# Patient Record
Sex: Female | Born: 1955 | Race: Black or African American | Hispanic: No | Marital: Married | Smoking: Never smoker
Health system: Southern US, Community
[De-identification: ages and names within clinical notes are randomized; demographics above are authoritative.]

## PROBLEM LIST (undated history)

## (undated) DIAGNOSIS — R112 Nausea with vomiting, unspecified: Secondary | ICD-10-CM

## (undated) DIAGNOSIS — M1990 Unspecified osteoarthritis, unspecified site: Secondary | ICD-10-CM

## (undated) DIAGNOSIS — J309 Allergic rhinitis, unspecified: Secondary | ICD-10-CM

## (undated) DIAGNOSIS — R42 Dizziness and giddiness: Secondary | ICD-10-CM

## (undated) DIAGNOSIS — M545 Low back pain: Secondary | ICD-10-CM

## (undated) DIAGNOSIS — E7800 Pure hypercholesterolemia, unspecified: Secondary | ICD-10-CM

## (undated) DIAGNOSIS — K219 Gastro-esophageal reflux disease without esophagitis: Secondary | ICD-10-CM

## (undated) DIAGNOSIS — K589 Irritable bowel syndrome without diarrhea: Secondary | ICD-10-CM

## (undated) DIAGNOSIS — D649 Anemia, unspecified: Secondary | ICD-10-CM

## (undated) DIAGNOSIS — H60399 Other infective otitis externa, unspecified ear: Secondary | ICD-10-CM

## (undated) DIAGNOSIS — Z9889 Other specified postprocedural states: Secondary | ICD-10-CM

## (undated) DIAGNOSIS — M1711 Unilateral primary osteoarthritis, right knee: Secondary | ICD-10-CM

## (undated) DIAGNOSIS — I10 Essential (primary) hypertension: Secondary | ICD-10-CM

## (undated) DIAGNOSIS — M25519 Pain in unspecified shoulder: Secondary | ICD-10-CM

## (undated) DIAGNOSIS — M549 Dorsalgia, unspecified: Secondary | ICD-10-CM

## (undated) DIAGNOSIS — M109 Gout, unspecified: Secondary | ICD-10-CM

## (undated) HISTORY — DX: Other infective otitis externa, unspecified ear: H60.399

## (undated) HISTORY — DX: Irritable bowel syndrome without diarrhea: K58.9

## (undated) HISTORY — DX: Unspecified osteoarthritis, unspecified site: M19.90

## (undated) HISTORY — DX: Gastro-esophageal reflux disease without esophagitis: K21.9

## (undated) HISTORY — DX: Essential (primary) hypertension: I10

## (undated) HISTORY — PX: INGUINAL HERNIA REPAIR: SUR1180

## (undated) HISTORY — PX: JOINT REPLACEMENT: SHX530

## (undated) HISTORY — DX: Low back pain: M54.5

## (undated) HISTORY — DX: Allergic rhinitis, unspecified: J30.9

## (undated) HISTORY — DX: Anemia, unspecified: D64.9

## (undated) HISTORY — DX: Dorsalgia, unspecified: M54.9

## (undated) HISTORY — DX: Pain in unspecified shoulder: M25.519

## (undated) HISTORY — DX: Dizziness and giddiness: R42

## (undated) HISTORY — PX: UMBILICAL HERNIA REPAIR: SHX196

## (undated) HISTORY — DX: Gout, unspecified: M10.9

## (undated) HISTORY — PX: EXPLORATORY LAPAROTOMY: SUR591

---

## 1989-10-17 HISTORY — PX: LAPAROSCOPIC CHOLECYSTECTOMY: SUR755

## 1991-10-18 HISTORY — PX: OVARIAN CYST REMOVAL: SHX89

## 1994-10-17 HISTORY — PX: INCISIONAL HERNIA REPAIR: SHX193

## 1998-07-03 ENCOUNTER — Emergency Department (HOSPITAL_COMMUNITY): Admission: EM | Admit: 1998-07-03 | Discharge: 1998-07-03 | Payer: Self-pay | Admitting: *Deleted

## 1999-03-26 ENCOUNTER — Ambulatory Visit (HOSPITAL_COMMUNITY): Admission: RE | Admit: 1999-03-26 | Discharge: 1999-03-26 | Payer: Self-pay | Admitting: *Deleted

## 1999-03-30 ENCOUNTER — Ambulatory Visit (HOSPITAL_COMMUNITY): Admission: RE | Admit: 1999-03-30 | Discharge: 1999-03-30 | Payer: Self-pay | Admitting: *Deleted

## 1999-04-02 ENCOUNTER — Ambulatory Visit (HOSPITAL_COMMUNITY): Admission: RE | Admit: 1999-04-02 | Discharge: 1999-04-02 | Payer: Self-pay | Admitting: *Deleted

## 1999-04-02 ENCOUNTER — Encounter: Payer: Self-pay | Admitting: *Deleted

## 2000-03-20 ENCOUNTER — Ambulatory Visit (HOSPITAL_COMMUNITY): Admission: RE | Admit: 2000-03-20 | Discharge: 2000-03-20 | Payer: Self-pay | Admitting: Internal Medicine

## 2000-03-20 ENCOUNTER — Encounter: Payer: Self-pay | Admitting: Internal Medicine

## 2000-09-18 ENCOUNTER — Inpatient Hospital Stay (HOSPITAL_COMMUNITY): Admission: EM | Admit: 2000-09-18 | Discharge: 2000-09-22 | Payer: Self-pay | Admitting: Emergency Medicine

## 2000-09-18 ENCOUNTER — Encounter: Payer: Self-pay | Admitting: Emergency Medicine

## 2000-09-19 ENCOUNTER — Encounter (HOSPITAL_BASED_OUTPATIENT_CLINIC_OR_DEPARTMENT_OTHER): Payer: Self-pay | Admitting: General Surgery

## 2000-11-23 ENCOUNTER — Encounter: Payer: Self-pay | Admitting: Emergency Medicine

## 2000-11-23 ENCOUNTER — Emergency Department (HOSPITAL_COMMUNITY): Admission: EM | Admit: 2000-11-23 | Discharge: 2000-11-23 | Payer: Self-pay | Admitting: Emergency Medicine

## 2000-11-26 ENCOUNTER — Encounter (INDEPENDENT_AMBULATORY_CARE_PROVIDER_SITE_OTHER): Payer: Self-pay | Admitting: *Deleted

## 2000-11-27 ENCOUNTER — Encounter (HOSPITAL_BASED_OUTPATIENT_CLINIC_OR_DEPARTMENT_OTHER): Payer: Self-pay | Admitting: General Surgery

## 2000-11-27 ENCOUNTER — Encounter: Payer: Self-pay | Admitting: Emergency Medicine

## 2000-11-27 ENCOUNTER — Inpatient Hospital Stay (HOSPITAL_COMMUNITY): Admission: EM | Admit: 2000-11-27 | Discharge: 2000-12-06 | Payer: Self-pay | Admitting: Emergency Medicine

## 2000-11-27 HISTORY — PX: ABDOMINAL WALL MESH  REMOVAL: SHX1116

## 2000-11-27 HISTORY — PX: LYSIS OF ADHESION: SHX5961

## 2000-11-30 ENCOUNTER — Encounter (HOSPITAL_BASED_OUTPATIENT_CLINIC_OR_DEPARTMENT_OTHER): Payer: Self-pay | Admitting: General Surgery

## 2000-12-03 ENCOUNTER — Encounter (HOSPITAL_BASED_OUTPATIENT_CLINIC_OR_DEPARTMENT_OTHER): Payer: Self-pay | Admitting: General Surgery

## 2000-12-04 ENCOUNTER — Encounter (HOSPITAL_BASED_OUTPATIENT_CLINIC_OR_DEPARTMENT_OTHER): Payer: Self-pay | Admitting: General Surgery

## 2000-12-05 ENCOUNTER — Encounter (HOSPITAL_BASED_OUTPATIENT_CLINIC_OR_DEPARTMENT_OTHER): Payer: Self-pay | Admitting: General Surgery

## 2001-09-03 ENCOUNTER — Encounter (INDEPENDENT_AMBULATORY_CARE_PROVIDER_SITE_OTHER): Payer: Self-pay | Admitting: *Deleted

## 2001-09-03 ENCOUNTER — Ambulatory Visit (HOSPITAL_COMMUNITY): Admission: RE | Admit: 2001-09-03 | Discharge: 2001-09-04 | Payer: Self-pay | Admitting: General Surgery

## 2001-09-03 HISTORY — PX: INCISIONAL HERNIA REPAIR: SHX193

## 2002-05-17 ENCOUNTER — Encounter: Payer: Self-pay | Admitting: Internal Medicine

## 2002-05-17 ENCOUNTER — Encounter: Admission: RE | Admit: 2002-05-17 | Discharge: 2002-05-17 | Payer: Self-pay | Admitting: Internal Medicine

## 2002-12-02 ENCOUNTER — Encounter (HOSPITAL_BASED_OUTPATIENT_CLINIC_OR_DEPARTMENT_OTHER): Payer: Self-pay | Admitting: General Surgery

## 2002-12-02 ENCOUNTER — Ambulatory Visit (HOSPITAL_COMMUNITY): Admission: RE | Admit: 2002-12-02 | Discharge: 2002-12-02 | Payer: Self-pay | Admitting: General Surgery

## 2002-12-27 ENCOUNTER — Encounter (HOSPITAL_BASED_OUTPATIENT_CLINIC_OR_DEPARTMENT_OTHER): Payer: Self-pay | Admitting: General Surgery

## 2002-12-31 ENCOUNTER — Ambulatory Visit (HOSPITAL_COMMUNITY): Admission: RE | Admit: 2002-12-31 | Discharge: 2002-12-31 | Payer: Self-pay | Admitting: General Surgery

## 2002-12-31 HISTORY — PX: INCISIONAL HERNIA REPAIR: SHX193

## 2004-04-11 ENCOUNTER — Emergency Department (HOSPITAL_COMMUNITY): Admission: EM | Admit: 2004-04-11 | Discharge: 2004-04-12 | Payer: Self-pay | Admitting: Emergency Medicine

## 2004-09-08 ENCOUNTER — Ambulatory Visit: Payer: Self-pay | Admitting: Internal Medicine

## 2004-09-09 ENCOUNTER — Inpatient Hospital Stay (HOSPITAL_COMMUNITY): Admission: EM | Admit: 2004-09-09 | Discharge: 2004-09-17 | Payer: Self-pay | Admitting: Emergency Medicine

## 2004-09-09 HISTORY — PX: INCISIONAL HERNIA REPAIR: SHX193

## 2005-02-11 ENCOUNTER — Ambulatory Visit: Payer: Self-pay | Admitting: Internal Medicine

## 2005-02-11 ENCOUNTER — Ambulatory Visit (HOSPITAL_COMMUNITY): Admission: RE | Admit: 2005-02-11 | Discharge: 2005-02-11 | Payer: Self-pay | Admitting: Internal Medicine

## 2005-11-22 ENCOUNTER — Other Ambulatory Visit: Admission: RE | Admit: 2005-11-22 | Discharge: 2005-11-22 | Payer: Self-pay | Admitting: Obstetrics and Gynecology

## 2006-09-16 LAB — CONVERTED CEMR LAB: Pap Smear: NORMAL

## 2006-10-21 ENCOUNTER — Ambulatory Visit: Payer: Self-pay | Admitting: Family Medicine

## 2006-11-02 ENCOUNTER — Ambulatory Visit: Payer: Self-pay | Admitting: Internal Medicine

## 2006-11-02 LAB — CONVERTED CEMR LAB
ALT: 15 units/L (ref 0–40)
AST: 17 units/L (ref 0–37)
Albumin: 3.8 g/dL (ref 3.5–5.2)
Alkaline Phosphatase: 70 units/L (ref 39–117)
BUN: 6 mg/dL (ref 6–23)
Basophils Absolute: 0.1 10*3/uL (ref 0.0–0.1)
Basophils Relative: 0.9 % (ref 0.0–1.0)
Bilirubin Urine: NEGATIVE
CO2: 28 meq/L (ref 19–32)
Calcium: 9.3 mg/dL (ref 8.4–10.5)
Chloride: 100 meq/L (ref 96–112)
Cholesterol: 168 mg/dL (ref 0–200)
Creatinine, Ser: 0.5 mg/dL (ref 0.4–1.2)
Eosinophils Relative: 5.6 % — ABNORMAL HIGH (ref 0.0–5.0)
GFR calc Af Amer: 169 mL/min
GFR calc non Af Amer: 139 mL/min
Glucose, Bld: 99 mg/dL (ref 70–99)
HCT: 38.8 % (ref 36.0–46.0)
HDL: 67.3 mg/dL (ref >39.0)
Hemoglobin, Urine: NEGATIVE
Hemoglobin: 12.3 g/dL (ref 12.0–15.0)
Ketones, ur: NEGATIVE mg/dL
LDL Cholesterol: 94 mg/dL (ref 0–99)
Leukocytes, UA: NEGATIVE
Lymphocytes Relative: 38.4 % (ref 12.0–46.0)
MCHC: 31.8 g/dL (ref 30.0–36.0)
MCV: 84.1 fL (ref 78.0–100.0)
Monocytes Absolute: 0.5 10*3/uL (ref 0.2–0.7)
Monocytes Relative: 7 % (ref 3.0–11.0)
Neutro Abs: 3.6 10*3/uL (ref 1.4–7.7)
Neutrophils Relative %: 48.1 % (ref 43.0–77.0)
Nitrite: NEGATIVE
Platelets: 357 10*3/uL (ref 150–400)
Potassium: 3.5 meq/L (ref 3.5–5.1)
RBC: 4.61 M/uL (ref 3.87–5.11)
RDW: 11.8 % (ref 11.5–14.6)
Sodium: 135 meq/L (ref 135–145)
Specific Gravity, Urine: 1.02 (ref 1.000–1.03)
TSH: 1.07 microintl units/mL (ref 0.35–5.50)
Total Bilirubin: 0.7 mg/dL (ref 0.3–1.2)
Total CHOL/HDL Ratio: 2.5
Total Protein, Urine: NEGATIVE mg/dL
Total Protein: 7.9 g/dL (ref 6.0–8.3)
Triglycerides: 36 mg/dL (ref 0–149)
Urine Glucose: NEGATIVE mg/dL
Urobilinogen, UA: 1 (ref 0.0–1.0)
VLDL: 7 mg/dL (ref 0–40)
WBC: 7.4 10*3/uL (ref 4.5–10.5)
pH: 6 (ref 5.0–8.0)

## 2007-01-03 ENCOUNTER — Ambulatory Visit: Payer: Self-pay | Admitting: Internal Medicine

## 2007-02-26 ENCOUNTER — Ambulatory Visit: Payer: Self-pay | Admitting: Internal Medicine

## 2007-02-26 LAB — CONVERTED CEMR LAB
ALT: 13 units/L (ref 0–40)
AST: 17 units/L (ref 0–37)
Albumin: 3.6 g/dL (ref 3.5–5.2)
Alkaline Phosphatase: 71 units/L (ref 39–117)
Amylase: 63 units/L (ref 27–131)
BUN: 6 mg/dL (ref 6–23)
Bacteria, UA: NEGATIVE
Basophils Absolute: 0 10*3/uL (ref 0.0–0.1)
Basophils Relative: 0.5 % (ref 0.0–1.0)
Bilirubin Urine: NEGATIVE
Bilirubin, Direct: 0.2 mg/dL (ref 0.0–0.3)
CO2: 26 meq/L (ref 19–32)
Calcium: 9.3 mg/dL (ref 8.4–10.5)
Chloride: 108 meq/L (ref 96–112)
Creatinine, Ser: 0.6 mg/dL (ref 0.4–1.2)
Crystals: NEGATIVE
Eosinophils Absolute: 0.2 10*3/uL (ref 0.0–0.6)
Eosinophils Relative: 4.1 % (ref 0.0–5.0)
GFR calc Af Amer: 137 mL/min
GFR calc non Af Amer: 113 mL/min
Glucose, Bld: 93 mg/dL (ref 70–99)
H Pylori IgG: NEGATIVE
HCT: 38.5 % (ref 36.0–46.0)
Hemoglobin, Urine: NEGATIVE
Hemoglobin: 12.8 g/dL (ref 12.0–15.0)
Ketones, ur: NEGATIVE mg/dL
Leukocytes, UA: NEGATIVE
Lipase: 17 units/L (ref 11.0–59.0)
Lymphocytes Relative: 31.4 % (ref 12.0–46.0)
MCHC: 33.3 g/dL (ref 30.0–36.0)
MCV: 82.9 fL (ref 78.0–100.0)
Monocytes Absolute: 0.5 10*3/uL (ref 0.2–0.7)
Monocytes Relative: 9.5 % (ref 3.0–11.0)
Mucus, UA: NEGATIVE
Neutro Abs: 3 10*3/uL (ref 1.4–7.7)
Neutrophils Relative %: 54.5 % (ref 43.0–77.0)
Nitrite: NEGATIVE
Platelets: 303 10*3/uL (ref 150–400)
Potassium: 4 meq/L (ref 3.5–5.1)
RBC / HPF: NONE SEEN
RBC: 4.65 M/uL (ref 3.87–5.11)
RDW: 11.8 % (ref 11.5–14.6)
Sodium: 140 meq/L (ref 135–145)
Specific Gravity, Urine: 1.02 (ref 1.000–1.03)
Total Bilirubin: 1 mg/dL (ref 0.3–1.2)
Total Protein, Urine: NEGATIVE mg/dL
Total Protein: 7.8 g/dL (ref 6.0–8.3)
Urine Glucose: NEGATIVE mg/dL
Urobilinogen, UA: 0.2 (ref 0.0–1.0)
WBC: 5.4 10*3/uL (ref 4.5–10.5)
pH: 6 (ref 5.0–8.0)

## 2007-06-05 ENCOUNTER — Ambulatory Visit: Payer: Self-pay | Admitting: Internal Medicine

## 2007-06-06 DIAGNOSIS — I10 Essential (primary) hypertension: Secondary | ICD-10-CM | POA: Insufficient documentation

## 2007-06-06 DIAGNOSIS — K219 Gastro-esophageal reflux disease without esophagitis: Secondary | ICD-10-CM | POA: Insufficient documentation

## 2007-06-06 DIAGNOSIS — M1990 Unspecified osteoarthritis, unspecified site: Secondary | ICD-10-CM | POA: Insufficient documentation

## 2007-06-06 DIAGNOSIS — M545 Low back pain, unspecified: Secondary | ICD-10-CM | POA: Insufficient documentation

## 2007-06-06 DIAGNOSIS — K589 Irritable bowel syndrome without diarrhea: Secondary | ICD-10-CM

## 2007-06-06 HISTORY — DX: Essential (primary) hypertension: I10

## 2007-06-06 HISTORY — DX: Irritable bowel syndrome, unspecified: K58.9

## 2007-06-06 HISTORY — DX: Unspecified osteoarthritis, unspecified site: M19.90

## 2007-06-06 HISTORY — DX: Low back pain, unspecified: M54.50

## 2007-06-06 HISTORY — DX: Gastro-esophageal reflux disease without esophagitis: K21.9

## 2007-06-14 ENCOUNTER — Encounter: Payer: Self-pay | Admitting: Internal Medicine

## 2007-09-11 ENCOUNTER — Ambulatory Visit: Payer: Self-pay | Admitting: Internal Medicine

## 2007-09-11 DIAGNOSIS — R42 Dizziness and giddiness: Secondary | ICD-10-CM | POA: Insufficient documentation

## 2007-09-11 DIAGNOSIS — J309 Allergic rhinitis, unspecified: Secondary | ICD-10-CM | POA: Insufficient documentation

## 2007-09-11 DIAGNOSIS — H60399 Other infective otitis externa, unspecified ear: Secondary | ICD-10-CM | POA: Insufficient documentation

## 2007-09-11 HISTORY — DX: Dizziness and giddiness: R42

## 2007-09-11 HISTORY — DX: Allergic rhinitis, unspecified: J30.9

## 2007-09-11 HISTORY — DX: Other infective otitis externa, unspecified ear: H60.399

## 2007-11-14 ENCOUNTER — Ambulatory Visit: Payer: Self-pay | Admitting: Internal Medicine

## 2007-11-14 ENCOUNTER — Encounter (INDEPENDENT_AMBULATORY_CARE_PROVIDER_SITE_OTHER): Payer: Self-pay

## 2007-11-14 DIAGNOSIS — M109 Gout, unspecified: Secondary | ICD-10-CM

## 2007-11-14 DIAGNOSIS — M549 Dorsalgia, unspecified: Secondary | ICD-10-CM

## 2007-11-14 HISTORY — DX: Gout, unspecified: M10.9

## 2007-11-14 HISTORY — DX: Dorsalgia, unspecified: M54.9

## 2007-11-14 LAB — CONVERTED CEMR LAB
ALT: 14 units/L (ref 0–35)
ALT: 14 units/L (ref 0–35)
AST: 16 units/L (ref 0–37)
AST: 16 units/L (ref 0–37)
Albumin: 3.6 g/dL (ref 3.5–5.2)
Albumin: 3.6 g/dL (ref 3.5–5.2)
Alkaline Phosphatase: 61 units/L (ref 39–117)
Alkaline Phosphatase: 61 units/L (ref 39–117)
BUN: 9 mg/dL (ref 6–23)
BUN: 9 mg/dL (ref 6–23)
Basophils Absolute: 0 10*3/uL (ref 0.0–0.1)
Basophils Absolute: 0 10*3/uL (ref 0.0–0.1)
Basophils Relative: 0.4 % (ref 0.0–1.0)
Basophils Relative: 0.4 % (ref 0.0–1.0)
Bilirubin Urine: NEGATIVE
Bilirubin Urine: NEGATIVE
Bilirubin, Direct: 0.2 mg/dL (ref 0.0–0.3)
Bilirubin, Direct: 0.2 mg/dL (ref 0.0–0.3)
CO2: 25 meq/L (ref 19–32)
CO2: 25 meq/L (ref 19–32)
Calcium: 9.1 mg/dL (ref 8.4–10.5)
Calcium: 9.1 mg/dL (ref 8.4–10.5)
Chloride: 106 meq/L (ref 96–112)
Chloride: 106 meq/L (ref 96–112)
Cholesterol: 162 mg/dL (ref 0–200)
Cholesterol: 162 mg/dL (ref 0–200)
Creatinine, Ser: 0.6 mg/dL (ref 0.4–1.2)
Creatinine, Ser: 0.6 mg/dL (ref 0.4–1.2)
Eosinophils Absolute: 0.2 10*3/uL (ref 0.0–0.6)
Eosinophils Absolute: 0.2 10*3/uL (ref 0.0–0.6)
Eosinophils Relative: 4 % (ref 0.0–5.0)
Eosinophils Relative: 4 % (ref 0.0–5.0)
GFR calc Af Amer: 136 mL/min
GFR calc Af Amer: 136 mL/min
GFR calc non Af Amer: 112 mL/min
GFR calc non Af Amer: 112 mL/min
Glucose, Bld: 110 mg/dL — ABNORMAL HIGH (ref 70–99)
Glucose, Bld: 110 mg/dL — ABNORMAL HIGH (ref 70–99)
HCT: 37.1 % (ref 36.0–46.0)
HCT: 37.1 % (ref 36.0–46.0)
HDL: 53.7 mg/dL (ref >39.0)
HDL: 53.7 mg/dL (ref >39.0)
Hemoglobin, Urine: NEGATIVE
Hemoglobin, Urine: NEGATIVE
Hemoglobin: 11.8 g/dL — ABNORMAL LOW (ref 12.0–15.0)
Hemoglobin: 11.8 g/dL — ABNORMAL LOW (ref 12.0–15.0)
Ketones, ur: NEGATIVE mg/dL
Ketones, ur: NEGATIVE mg/dL
LDL Cholesterol: 100 mg/dL — ABNORMAL HIGH (ref 0–99)
LDL Cholesterol: 100 mg/dL — ABNORMAL HIGH (ref 0–99)
Leukocytes, UA: NEGATIVE
Leukocytes, UA: NEGATIVE
Lymphocytes Relative: 38.2 % (ref 12.0–46.0)
Lymphocytes Relative: 38.2 % (ref 12.0–46.0)
MCHC: 31.9 g/dL (ref 30.0–36.0)
MCHC: 31.9 g/dL (ref 30.0–36.0)
MCV: 83.7 fL (ref 78.0–100.0)
MCV: 83.7 fL (ref 78.0–100.0)
Monocytes Absolute: 0.4 10*3/uL (ref 0.2–0.7)
Monocytes Absolute: 0.4 10*3/uL (ref 0.2–0.7)
Monocytes Relative: 7.7 % (ref 3.0–11.0)
Monocytes Relative: 7.7 % (ref 3.0–11.0)
Neutro Abs: 2.7 10*3/uL (ref 1.4–7.7)
Neutro Abs: 2.7 10*3/uL (ref 1.4–7.7)
Neutrophils Relative %: 49.7 % (ref 43.0–77.0)
Neutrophils Relative %: 49.7 % (ref 43.0–77.0)
Nitrite: NEGATIVE
Nitrite: NEGATIVE
Platelets: 297 10*3/uL (ref 150–400)
Platelets: 297 10*3/uL (ref 150–400)
Potassium: 3.7 meq/L (ref 3.5–5.1)
Potassium: 3.7 meq/L (ref 3.5–5.1)
RBC: 4.43 M/uL (ref 3.87–5.11)
RBC: 4.43 M/uL (ref 3.87–5.11)
RDW: 12.2 % (ref 11.5–14.6)
RDW: 12.2 % (ref 11.5–14.6)
Sodium: 137 meq/L (ref 135–145)
Sodium: 137 meq/L (ref 135–145)
Specific Gravity, Urine: >=1.03 (ref 1.000–1.03)
Specific Gravity, Urine: >=1.03 (ref 1.000–1.03)
TSH: 1.88 microintl units/mL (ref 0.35–5.50)
TSH: 1.88 microintl units/mL (ref 0.35–5.50)
Total Bilirubin: 0.9 mg/dL (ref 0.3–1.2)
Total Bilirubin: 0.9 mg/dL (ref 0.3–1.2)
Total CHOL/HDL Ratio: 3
Total CHOL/HDL Ratio: 3
Total Protein, Urine: NEGATIVE mg/dL
Total Protein, Urine: NEGATIVE mg/dL
Total Protein: 7.4 g/dL (ref 6.0–8.3)
Total Protein: 7.4 g/dL (ref 6.0–8.3)
Triglycerides: 43 mg/dL (ref 0–149)
Triglycerides: 43 mg/dL (ref 0–149)
Uric Acid, Serum: 5.6 mg/dL (ref 2.4–7.0)
Urine Glucose: NEGATIVE mg/dL
Urine Glucose: NEGATIVE mg/dL
Urobilinogen, UA: 0.2 (ref 0.0–1.0)
Urobilinogen, UA: 0.2 (ref 0.0–1.0)
VLDL: 9 mg/dL (ref 0–40)
VLDL: 9 mg/dL (ref 0–40)
WBC: 5.3 10*3/uL (ref 4.5–10.5)
WBC: 5.3 10*3/uL (ref 4.5–10.5)
pH: 5.5 (ref 5.0–8.0)
pH: 5.5 (ref 5.0–8.0)

## 2008-12-30 ENCOUNTER — Ambulatory Visit: Payer: Self-pay | Admitting: Internal Medicine

## 2008-12-30 DIAGNOSIS — M25519 Pain in unspecified shoulder: Secondary | ICD-10-CM

## 2008-12-30 HISTORY — DX: Pain in unspecified shoulder: M25.519

## 2009-01-15 LAB — CONVERTED CEMR LAB: Pap Smear: NORMAL

## 2009-01-15 LAB — HM MAMMOGRAPHY: HM Mammogram: NORMAL

## 2009-02-04 ENCOUNTER — Telehealth: Payer: Self-pay | Admitting: Internal Medicine

## 2009-08-06 ENCOUNTER — Emergency Department (HOSPITAL_COMMUNITY): Admission: EM | Admit: 2009-08-06 | Discharge: 2009-08-07 | Payer: Self-pay | Admitting: Emergency Medicine

## 2009-08-28 ENCOUNTER — Encounter: Payer: Self-pay | Admitting: Internal Medicine

## 2009-09-01 ENCOUNTER — Encounter: Admission: RE | Admit: 2009-09-01 | Discharge: 2009-09-01 | Payer: Self-pay | Admitting: General Surgery

## 2009-10-17 HISTORY — PX: OTHER SURGICAL HISTORY: SHX169

## 2009-10-20 ENCOUNTER — Encounter (INDEPENDENT_AMBULATORY_CARE_PROVIDER_SITE_OTHER): Payer: Self-pay | Admitting: General Surgery

## 2009-10-20 ENCOUNTER — Inpatient Hospital Stay (HOSPITAL_COMMUNITY): Admission: AD | Admit: 2009-10-20 | Discharge: 2009-10-28 | Payer: Self-pay | Admitting: General Surgery

## 2009-10-20 HISTORY — PX: LYSIS OF ADHESION: SHX5961

## 2009-10-20 HISTORY — PX: BILATERAL SALPINGOOPHORECTOMY: SHX1223

## 2009-11-16 ENCOUNTER — Encounter: Payer: Self-pay | Admitting: Internal Medicine

## 2010-01-20 ENCOUNTER — Encounter: Payer: Self-pay | Admitting: Internal Medicine

## 2010-02-24 ENCOUNTER — Ambulatory Visit: Payer: Self-pay | Admitting: Internal Medicine

## 2010-02-24 LAB — CONVERTED CEMR LAB
ALT: 28 units/L (ref 0–35)
AST: 20 units/L (ref 0–37)
Albumin: 4.1 g/dL (ref 3.5–5.2)
Alkaline Phosphatase: 75 units/L (ref 39–117)
BUN: 11 mg/dL (ref 6–23)
Basophils Absolute: 0 10*3/uL (ref 0.0–0.1)
Basophils Relative: 0.3 % (ref 0.0–3.0)
Bilirubin Urine: NEGATIVE
Bilirubin, Direct: 0.1 mg/dL (ref 0.0–0.3)
CO2: 28 meq/L (ref 19–32)
Calcium: 9.5 mg/dL (ref 8.4–10.5)
Chloride: 107 meq/L (ref 96–112)
Cholesterol: 171 mg/dL (ref 0–200)
Creatinine, Ser: 0.4 mg/dL (ref 0.4–1.2)
Eosinophils Absolute: 0.4 10*3/uL (ref 0.0–0.7)
Eosinophils Relative: 8.9 % — ABNORMAL HIGH (ref 0.0–5.0)
GFR calc non Af Amer: 208.77 mL/min (ref >60)
Glucose, Bld: 98 mg/dL (ref 70–99)
HCT: 37 % (ref 36.0–46.0)
HDL: 78.2 mg/dL (ref >39.00)
Hemoglobin, Urine: NEGATIVE
Hemoglobin: 12.5 g/dL (ref 12.0–15.0)
Ketones, ur: NEGATIVE mg/dL
LDL Cholesterol: 87 mg/dL (ref 0–99)
Leukocytes, UA: NEGATIVE
Lymphocytes Relative: 41.7 % (ref 12.0–46.0)
Lymphs Abs: 1.9 10*3/uL (ref 0.7–4.0)
MCHC: 33.7 g/dL (ref 30.0–36.0)
MCV: 81.3 fL (ref 78.0–100.0)
Monocytes Absolute: 0.4 10*3/uL (ref 0.1–1.0)
Monocytes Relative: 8.8 % (ref 3.0–12.0)
Neutro Abs: 1.8 10*3/uL (ref 1.4–7.7)
Neutrophils Relative %: 40.3 % — ABNORMAL LOW (ref 43.0–77.0)
Nitrite: NEGATIVE
Platelets: 260 10*3/uL (ref 150.0–400.0)
Potassium: 4.1 meq/L (ref 3.5–5.1)
RBC: 4.55 M/uL (ref 3.87–5.11)
RDW: 13.6 % (ref 11.5–14.6)
Sodium: 142 meq/L (ref 135–145)
Specific Gravity, Urine: 1.025 (ref 1.000–1.030)
TSH: 1.92 microintl units/mL (ref 0.35–5.50)
Total Bilirubin: 0.9 mg/dL (ref 0.3–1.2)
Total CHOL/HDL Ratio: 2
Total Protein, Urine: NEGATIVE mg/dL
Total Protein: 7.9 g/dL (ref 6.0–8.3)
Triglycerides: 29 mg/dL (ref 0.0–149.0)
Urine Glucose: NEGATIVE mg/dL
Urobilinogen, UA: 0.2 (ref 0.0–1.0)
VLDL: 5.8 mg/dL (ref 0.0–40.0)
WBC: 4.6 10*3/uL (ref 4.5–10.5)
pH: 5.5 (ref 5.0–8.0)

## 2010-04-22 ENCOUNTER — Encounter: Payer: Self-pay | Admitting: Internal Medicine

## 2010-06-11 ENCOUNTER — Emergency Department (HOSPITAL_COMMUNITY): Admission: EM | Admit: 2010-06-11 | Discharge: 2010-06-12 | Payer: Self-pay | Admitting: Emergency Medicine

## 2010-06-14 ENCOUNTER — Telehealth: Payer: Self-pay | Admitting: Internal Medicine

## 2010-11-16 NOTE — Letter (Signed)
Summary: [facility]   Imported By: Sherian Rein 12/07/2009 07:24:27  _____________________________________________________________________  External Attachment:    Type:   Image     Comment:   External Document

## 2010-11-16 NOTE — Progress Notes (Signed)
Summary: Call Report  Phone Note Other Incoming   Caller: [facility] Summary of Call: Triage Call Report Triage Record Num: [id number] Operator: Coralee North Royal Patient Name: Bianca Campbell Call Date & Time: 06/11/2010 10:31:44PM Patient Phone: [phone] PCP: Oliver Barre Patient Gender: Female PCP Fax : [phone] Patient DOB: 14-Jul-1956 Practice Name: [facility] Reason for Call: Pt calling about having chest pains, heaviness in middle of her chest, she has hot flashes and isn't sure if the heaviness is from that or indigestion or her heart. Onset: 8/26  ~ 1730-1830. She feels heart "racing" when she got on her bed. Advised to call 911. Protocol(s) Used: Chest Pain Recommended Outcome per Protocol: Activate EMS 911 Reason for Outcome: New onset or worsening odd heartbeats or different heart rate (fluttering, skipping, or rapid heart beat) with this chest pain One or more occurrences of unexplained pain in shoulders, neck, jaw, in one or both arms, stomach or back lasting more than a few minutes that has not been evaluated by a healthcare Elmo Shumard and has risk factors for cardiovascular disease. Care Advice:  ~ An adult should stay with the patient, preferably one trained in CPR.  ~ IMMEDIATE ACTION Write down Clothilde Tippetts's name. List or place the following in a bag for transport with the patient: current prescription and/or nonprescription medications; alternative treatments, therapies and medications; and street drugs.  ~  ~ List, or take, all current prescription(s), nonprescription or alternative medication(s) to [facility]in for evaluation. After calling EMS 911, have the person chew one aspirin tablet (325 mg), or 4 baby aspirin (81mg ) with a small amount of water now if conscious, not allergic to aspirin, or if has not been told to avoid taking aspirin by their Tabitha Tupper. It is important to use aspirin, not acetaminophen.  ~ 06/11/2010 10:48:51PM Page 1 of 1  [id number] Initial call taken by: Margaret Pyle, CMA,  June 14, 2010 8:15 AM  Follow-up for Phone Call        noted  Follow-up by: [name] MD,  June 14, 2010 1:03 PM

## 2010-11-16 NOTE — Assessment & Plan Note (Signed)
Summary: req f/u appt/#/cd   Vital Signs:  Patient profile:   55 year old female Height:      60 inches Weight:      201 pounds BMI:     39.40 O2 Sat:      96 % on Room air Temp:     98.1 degrees F oral Pulse rate:   79 / minute BP sitting:   120 / 92  (left arm) Cuff size:   large  Vitals Entered By: Bill Salinas CMA (Feb 24, 2010 9:25 AM)  O2 Flow:  Room air  Preventive Care Screening  Pap Smear:    Date:  01/15/2009    Results:  normal   Bone Density:    Date:  01/15/2009    Results:  normal std dev  Mammogram:    Date:  01/15/2009    Results:  normal      declines colonoscopy due to recent abd surgury;    CC: pt here for a follow up office visit, she states she is only taking Azor, she is not taking any other medications/ ab   CC:  pt here for a follow up office visit, she states she is only taking Azor, and she is not taking any other medications/ ab.  History of Present Illness: overall doing well; lost 5 lbs overall since mar 2010;  wants to try "organics"  and maybe get off her azor;  Pt denies CP, sob, doe, wheezing, orthopnea, pnd, worsening LE edema, palps, dizziness or syncope  Pt denies new neuro symptoms such as headache, facial or extremity weakness     Problems Prior to Update: 1)  Shoulder Pain, Right  (ICD-719.41) 2)  Back Pain  (ICD-724.5) 3)  Preventive Health Care  (ICD-V70.0) 4)  Gout  (ICD-274.9) 5)  Gout  (ICD-274.9) 6)  Health Maintenance Exam  (ICD-V70.0) 7)  External Otitis  (ICD-380.10) 8)  Intermittent Vertigo  (ICD-780.4) 9)  Allergic Rhinitis  (ICD-477.9) 10)  Osteoarthritis  (ICD-715.90) 11)  Gerd  (ICD-530.81) 12)  Irritable Bowel Syndrome  (ICD-564.1) 13)  Low Back Pain  (ICD-724.2) 14)  Hypertension  (ICD-401.9)  Medications Prior to Update: 1)  Azor 10-40 Mg  Tabs (Amlodipine-Olmesartan) .Marland Kitchen.. 1 By Mouth Qd 2)  Ecotrin Low Strength 81 Mg  Tbec (Aspirin) .Marland Kitchen.. 1 By Mouth Qd 3)  Triamcinolone Acetonide 0.5 %  Crea  (Triamcinolone Acetonide) .... Use Asd Two Times A Day Prn 4)  Meclizine Hcl 12.5 Mg  Tabs (Meclizine Hcl) .Marland Kitchen.. 1 By Mouth Qid Prn 5)  Cetirizine Hcl 10 Mg  Tabs (Cetirizine Hcl) .Marland Kitchen.. 1 By Mouth Once Daily Prn 6)  Colchicine 0.6 Mg  Tabs (Colchicine) .Marland Kitchen.. 1 By Mouth Three Times A Day Prn 7)  Indocin Sr 75 Mg  Cpcr (Indomethacin) .Marland Kitchen.. 1 By Mouth Two Times A Day Prn 8)  Oxycodone-Acetaminophen 5-500 Mg Caps (Oxycodone-Acetaminophen) .Marland Kitchen.. 1 By Mouth Every 4-6 Hours 9)  Flexeril 5 Mg Tabs (Cyclobenzaprine Hcl) .Marland Kitchen.. 1 By Mouth Three Times A Day As Needed  Current Medications (verified): 1)  Azor 10-40 Mg  Tabs (Amlodipine-Olmesartan) .Marland Kitchen.. 1 By Mouth Once Daily 2)  Ecotrin Low Strength 81 Mg  Tbec (Aspirin) .Marland Kitchen.. 1 By Mouth Qd  Allergies (verified): No Known Drug Allergies  Past History:  Family History: Last updated: 11/14/2007 father with colon polyp ag 2 yo mother with RA  Social History: Last updated: 11/14/2007 Never Smoked Alcohol use-no unemployed - recently laid off  Risk Factors: Smoking Status: never (09/11/2007)  Past Medical History: Reviewed history from 11/14/2007 and no changes required. Hypertension Low back pain Obesity IBS GERD Osteoarthritis Allergic rhinitis Gout  Past Surgical History: Caesarean section X2 Cholecystectomy Inguinal herniorrhaphy Laparotomy-exploratory s/p incarcerated hernia and ovary cyst jan 2011  Review of Systems       all otherwise negative per pt -    Physical Exam  General:  alert and overweight-appearing.   Head:  normocephalic and atraumatic.   Eyes:  vision grossly intact, pupils equal, and pupils round.   Ears:  R ear normal and L ear normal.   Nose:  no external deformity and no nasal discharge.   Mouth:  no gingival abnormalities and pharynx pink and moist.   Neck:  supple and no masses.   Lungs:  normal respiratory effort and normal breath sounds.   Heart:  normal rate and regular rhythm.   Abdomen:  soft,  non-tender, and normal bowel sounds.   Msk:  no joint tenderness and no joint swelling.   Extremities:  no edema, no erythema  Neurologic:  cranial nerves II-XII intact and strength normal in all extremities.   Skin:  color normal and no rashes.   Psych:  memory intact for recent and remote and normally interactive.     Impression & Recommendations:  Problem # 1:  Preventive Health Care (ICD-V70.0)  Overall doing well, age appropriate education and counseling updated and referral for appropriate preventive services done unless declined, immunizations up to date or declined, diet counseling done if overweight, urged to quit smoking if smokes , most recent labs reviewed and current ordered if appropriate, ecg reviewed or declined (interpretation per ECG scanned in the EMR if done); information regarding Medicare Prevention requirements given if appropriate; speciality referrals updated as appropriate   Orders: TLB-BMP (Basic Metabolic Panel-BMET) (80048-METABOL) TLB-CBC Platelet - w/Differential (85025-CBCD) TLB-Hepatic/Liver Function Pnl (80076-HEPATIC) TLB-Lipid Panel (80061-LIPID) TLB-TSH (Thyroid Stimulating Hormone) (84443-TSH) TLB-Udip ONLY (81003-UDIP)  Problem # 2:  HYPERTENSION (ICD-401.9)  Her updated medication list for this problem includes:    Azor 10-40 Mg Tabs (Amlodipine-olmesartan) .Marland Kitchen... 1 by mouth once daily  BP today: 120/92 Prior BP: 160/120 (12/30/2008)  Labs Reviewed: K+: 3.7 (11/14/2007) Creat: : 0.6 (11/14/2007)   Chol: 162 (11/14/2007)   HDL: 53.7 (11/14/2007)   LDL: 100 (11/14/2007)   TG: 43 (11/14/2007) stable overall by hx and exam, ok to continue meds/tx as is   Complete Medication List: 1)  Azor 10-40 Mg Tabs (Amlodipine-olmesartan) .Marland Kitchen.. 1 by mouth once daily 2)  Ecotrin Low Strength 81 Mg Tbec (Aspirin) .Marland Kitchen.. 1 by mouth qd  Other Orders: Tdap => 73yrs IM (43329) Admin 1st Vaccine (51884)  Patient Instructions: 1)  Take an Aspirin every day - 81  mg - 1 per day - COATEd only 2)  you had the tetanus shot today 3)  Please go to the Lab in the basement for your blood and/or urine tests today  4)  Continue all previous medications as before this visit  5)  please remember your yearly pap smear and mammogram 6)  Please schedule a follow-up appointment in 1 year or sooner if needed Prescriptions: AZOR 10-40 MG  TABS (AMLODIPINE-OLMESARTAN) 1 by mouth once daily  #90 x 3   Entered and Authorized by:   [name] MD   Signed by:   [name] MD on 02/24/2010   Method used:   Print then Give to Patient   RxID:   [id number]    Immunization History:  Influenza  Immunization History:    Influenza:  declined (02/24/2010)  Immunizations Administered:  Tetanus Vaccine:    Vaccine Type: Tdap    Site: left deltoid    Mfr: GlaxoSmithKline    Dose: 0.5 ml    Route: IM    Given by: Ami Bullins CMA    Exp. Date: 01/09/2012    Lot #: [id number]    VIS given: 09/04/07 version given Feb 24, 2010.

## 2010-11-16 NOTE — Letter (Signed)
Summary: [facility]   Imported By: Lester Westgate 02/03/2010 09:33:48  _____________________________________________________________________  External Attachment:    Type:   Image     Comment:   External Document

## 2010-11-16 NOTE — Letter (Signed)
Summary: [facility]   Imported By: Sherian Rein 04/29/2010 09:13:13  _____________________________________________________________________  External Attachment:    Type:   Image     Comment:   External Document

## 2010-12-30 LAB — BASIC METABOLIC PANEL
BUN: 13 mg/dL (ref 6–23)
CO2: 25 mEq/L (ref 19–32)
Calcium: 9.7 mg/dL (ref 8.4–10.5)
Chloride: 104 mEq/L (ref 96–112)
Creatinine, Ser: 0.56 mg/dL (ref 0.4–1.2)
GFR calc Af Amer: >60 mL/min (ref >60)
GFR calc non Af Amer: >60 mL/min (ref >60)
Glucose, Bld: 121 mg/dL — ABNORMAL HIGH (ref 70–99)
Potassium: 4 mEq/L (ref 3.5–5.1)
Sodium: 136 mEq/L (ref 135–145)

## 2010-12-30 LAB — DIFFERENTIAL
Basophils Absolute: 0 10*3/uL (ref 0.0–0.1)
Basophils Relative: 1 % (ref 0–1)
Eosinophils Absolute: 0.1 10*3/uL (ref 0.0–0.7)
Eosinophils Relative: 3 % (ref 0–5)
Lymphocytes Relative: 41 % (ref 12–46)
Lymphs Abs: 2.3 10*3/uL (ref 0.7–4.0)
Monocytes Absolute: 0.4 10*3/uL (ref 0.1–1.0)
Monocytes Relative: 7 % (ref 3–12)
Neutro Abs: 2.8 10*3/uL (ref 1.7–7.7)
Neutrophils Relative %: 49 % (ref 43–77)

## 2010-12-30 LAB — POCT CARDIAC MARKERS
CKMB, poc: 1.2 ng/mL (ref 1.0–8.0)
Myoglobin, poc: 57.7 ng/mL (ref 12–200)
Troponin i, poc: <0.05 ng/mL (ref 0.00–0.09)

## 2010-12-30 LAB — D-DIMER, QUANTITATIVE: D-Dimer, Quant: <0.22 ug/mL-FEU (ref 0.00–0.48)

## 2010-12-30 LAB — CBC
HCT: 39.2 % (ref 36.0–46.0)
Hemoglobin: 13.1 g/dL (ref 12.0–15.0)
MCH: 27.8 pg (ref 26.0–34.0)
MCHC: 33.4 g/dL (ref 30.0–36.0)
MCV: 83.2 fL (ref 78.0–100.0)
Platelets: 291 10*3/uL (ref 150–400)
RBC: 4.72 MIL/uL (ref 3.87–5.11)
RDW: 14 % (ref 11.5–15.5)
WBC: 5.7 10*3/uL (ref 4.0–10.5)

## 2011-01-02 LAB — CBC
HCT: 31.5 % — ABNORMAL LOW (ref 36.0–46.0)
HCT: 33.3 % — ABNORMAL LOW (ref 36.0–46.0)
MCHC: 32.9 g/dL (ref 30.0–36.0)
MCHC: 33.2 g/dL (ref 30.0–36.0)
MCV: 84.2 fL (ref 78.0–100.0)
MCV: 84.4 fL (ref 78.0–100.0)
Platelets: 314 10*3/uL (ref 150–400)
RBC: 3.66 MIL/uL — ABNORMAL LOW (ref 3.87–5.11)
RBC: 3.74 MIL/uL — ABNORMAL LOW (ref 3.87–5.11)
RBC: 3.94 MIL/uL (ref 3.87–5.11)
RDW: 13.1 % (ref 11.5–15.5)
WBC: 10.1 10*3/uL (ref 4.0–10.5)

## 2011-01-02 LAB — BASIC METABOLIC PANEL
BUN: 5 mg/dL — ABNORMAL LOW (ref 6–23)
CO2: 26 mEq/L (ref 19–32)
CO2: 27 mEq/L (ref 19–32)
Calcium: 8.5 mg/dL (ref 8.4–10.5)
Chloride: 101 mEq/L (ref 96–112)
Chloride: 103 mEq/L (ref 96–112)
Creatinine, Ser: 0.53 mg/dL (ref 0.4–1.2)
Creatinine, Ser: 0.59 mg/dL (ref 0.4–1.2)
GFR calc Af Amer: >60 mL/min (ref >60)
GFR calc Af Amer: >60 mL/min (ref >60)
GFR calc Af Amer: >60 mL/min (ref >60)
Glucose, Bld: 115 mg/dL — ABNORMAL HIGH (ref 70–99)
Potassium: 4.3 mEq/L (ref 3.5–5.1)

## 2011-01-17 LAB — CBC
MCHC: 32.9 g/dL (ref 30.0–36.0)
Platelets: 279 10*3/uL (ref 150–400)
RDW: 13.8 % (ref 11.5–15.5)

## 2011-01-17 LAB — URINALYSIS, ROUTINE W REFLEX MICROSCOPIC
Bilirubin Urine: NEGATIVE
Ketones, ur: NEGATIVE mg/dL
Nitrite: NEGATIVE
Protein, ur: NEGATIVE mg/dL
Urobilinogen, UA: 0.2 mg/dL (ref 0.0–1.0)

## 2011-01-17 LAB — DIFFERENTIAL
Basophils Relative: 1 % (ref 0–1)
Lymphs Abs: 1.9 10*3/uL (ref 0.7–4.0)
Monocytes Absolute: 0.2 10*3/uL (ref 0.1–1.0)
Monocytes Relative: 5 % (ref 3–12)
Neutro Abs: 2.1 10*3/uL (ref 1.7–7.7)
Neutrophils Relative %: 47 % (ref 43–77)

## 2011-01-17 LAB — COMPREHENSIVE METABOLIC PANEL
ALT: 19 U/L (ref 0–35)
Albumin: 4 g/dL (ref 3.5–5.2)
Alkaline Phosphatase: 76 U/L (ref 39–117)
Calcium: 9.9 mg/dL (ref 8.4–10.5)
Potassium: 4.3 mEq/L (ref 3.5–5.1)
Sodium: 141 mEq/L (ref 135–145)
Total Protein: 8.3 g/dL (ref 6.0–8.3)

## 2011-01-17 LAB — PREGNANCY, URINE: Preg Test, Ur: NEGATIVE

## 2011-01-20 LAB — COMPREHENSIVE METABOLIC PANEL
ALT: 16 U/L (ref 0–35)
BUN: 8 mg/dL (ref 6–23)
CO2: 23 mEq/L (ref 19–32)
Calcium: 9.7 mg/dL (ref 8.4–10.5)
Creatinine, Ser: 0.43 mg/dL (ref 0.4–1.2)
GFR calc non Af Amer: >60 mL/min (ref >60)
Glucose, Bld: 115 mg/dL — ABNORMAL HIGH (ref 70–99)
Sodium: 134 mEq/L — ABNORMAL LOW (ref 135–145)
Total Protein: 7.7 g/dL (ref 6.0–8.3)

## 2011-01-20 LAB — DIFFERENTIAL
Basophils Absolute: 0 10*3/uL (ref 0.0–0.1)
Basophils Relative: 0 % (ref 0–1)
Eosinophils Absolute: 0.3 10*3/uL (ref 0.0–0.7)
Eosinophils Relative: 4 % (ref 0–5)
Monocytes Absolute: 0.3 10*3/uL (ref 0.1–1.0)
Monocytes Relative: 4 % (ref 3–12)
Neutro Abs: 4.6 10*3/uL (ref 1.7–7.7)

## 2011-01-20 LAB — CBC
Hemoglobin: 12.9 g/dL (ref 12.0–15.0)
MCHC: 32.7 g/dL (ref 30.0–36.0)
MCV: 83.1 fL (ref 78.0–100.0)
RBC: 4.76 MIL/uL (ref 3.87–5.11)
RDW: 13.4 % (ref 11.5–15.5)

## 2011-01-20 LAB — URINALYSIS, ROUTINE W REFLEX MICROSCOPIC
Bilirubin Urine: NEGATIVE
Hgb urine dipstick: NEGATIVE
Ketones, ur: 15 mg/dL — AB
Nitrite: NEGATIVE
Protein, ur: NEGATIVE mg/dL
Urobilinogen, UA: 0.2 mg/dL (ref 0.0–1.0)

## 2011-03-04 NOTE — Op Note (Signed)
NAMESYA, NESTLER                        ACCOUNT NO.:  [id number]   MEDICAL RECORD NO.:  [id number]                   PATIENT TYPE:  OIB   LOCATION:  2899                                 FACILITY:  [facility]   PHYSICIAN:  Leonie Man, M.D.                DATE OF BIRTH:  20-May-1956   DATE OF PROCEDURE:  12/31/2002  DATE OF DISCHARGE:                                 OPERATIVE REPORT   PREOPERATIVE DIAGNOSIS:  Recurrent ventral hernia.   POSTOPERATIVE DIAGNOSIS:  Recurrent ventral hernia.   OPERATION PERFORMED:  Repair of recurrent ventral hernia.   SURGEON:  Leonie Man, M.D.   ASSISTANT:  Nurse.   ANESTHESIA:  General.   INDICATIONS FOR PROCEDURE:  The patient is a 55 year old woman who has had  recurrent ventral hernias in the past, her original surgery having been for  ovarian cyst.  She returns now with a painful mass to the right and just  below the umbilicus which on palpation is an incarcerated ventral hernia.  She comes to the operating room after the risks and potential benefits of  the surgery have been fully discussed,  all questions answered and consent  obtained.   DESCRIPTION OF PROCEDURE:  Following the induction of satisfactory general  anesthesia, the patient positioned supinely.  The abdomen was prepped and  draped to be included in the sterile operative field.  The old midline  incision was opened up inferior to the umbilicus, deepened through the skin  and subcutaneous tissue carrying the dissection to the right laterally and  inferiorly to the region of the hernial sac and this was dissected free from  the surrounding soft tissues and carried down to the fascia.  There was a  portion of incarcerated small bowel within the hernia sac and the sac was  tightly adherent to the bowel.  The region around the hernia was dissected  free and the hernia extended out laterally and medially so as to free up the  incarcerated bowel.  The incarcerated bowel was  freed up in its entirety and  returned and reduced into the peritoneal cavity.  In palpating the abdominal  wall, there was another small hernia just inferior to this and the original  wound was opened up into this new hernia so as to make a single defect.  I  used a Seprafilm slurry to place over the underlying bowel so as to prevent  adhesions, then sewed in a polypropylene mesh patch with both interrupted  and running sutures of #1 Novofil, thus obliterating the hernial defect.  This was checked for hemostasis and noted to be dry.  Sponge, instrument and  sharp counts were then verified.  The subcutaneous tissues were then closed  with a running 2-0 Vicryl sutures.  Skin closed with running 4-0 Monocryl  suture, then reinforced with Steri-Strips.  Sterile dressings applied.  Anesthetic was reversed and the patient removed from the  operating room to  the recovery room in stable condition, having tolerated the procedure well.                                                Leonie Man, M.D.    PB/MEDQ  D:  12/31/2002  T:  12/31/2002  Job:  [id number]

## 2011-03-04 NOTE — Discharge Summary (Signed)
[place]  Patient:    Bianca Campbell, Bianca Campbell                     MRN: [id number] Adm. Date:  [id number] Disc. Date: [id number] Attending:  Sonda Primes                           Discharge Summary  ADMISSION DIAGNOSES: 1. Small bowel obstruction, high-grade. 2. Incarcerated abdominal hernia. 3. Hypertension. 4. Gastroesophageal reflux disease. 5. Irritable bowel syndrome.  DISCHARGE DIAGNOSES: 1. Small bowel obstruction, high-grade. 2. Incarcerated abdominal hernia. 3. Hypertension. 4. Gastroesophageal reflux disease. 5. Irritable bowel syndrome.  PROCEDURES IN HOSPITAL: 1. Exploratory laparotomy with adhesiolysis for small bowel obstruction. 2. CT scan of the abdomen and pelvis which showed a pelvic cyst. 3. Confirmatory transvaginal ultrasound which also showed left-sided ovarian    cyst.  HOSPITAL COMPLICATIONS:  None.  CONDITION ON DISCHARGE:  Improved.  DISCHARGE MEDICATIONS:  Vicodin.  POST DISCHARGE FOLLOW-UP:  In two weeks to see Luisa Hart L. Lurene Shadow, M.D.  The patient is also instructed to make an appointment to see her gynecologist within two weeks with regard to her pelvic cyst.  HISTORY OF PRESENT ILLNESS:  This patient is a 55 year old woman admitted through the emergency room with complaints of abdominal pain, nausea, and vomiting.  She had had some diarrheal bowel movements prior to admission.  A CT scan of her abdomen was consistent with a distal small bowel obstruction. She had multiple abdominal wall hernias.  In one of the hernias, there was some incarcerated bowel noted.  She had no associated fever or leukocytosis on admission.  She has undergone multiple previous abdominal wall operations. Most recently she had had a ventral hernia repair of the lower abdomen.  HOSPITAL COURSE:  Following admission, the patient was prepared to be taken to the operating room on November 27, 2000, where she underwent  exploratory laparotomy for adhesive of small bowel obstruction and repair of her multiple abdominal wall hernias.  Her postoperative course was somewhat prolonged due to an ileus, but otherwise unremarkable with resumption of bowel activity and manifested by bowel movements and flatus by the seventh postoperative day. She did have a small paraumbilical hematoma which on repeat CT scan was noted not to be a recurrent hernia.  She also had a transvaginal ultrasound which confirmed the presence of a left adnexal mass.  DISPOSITION:  She is being discharged now to be followed up in our office as noted above.  DISCHARGE MEDICATIONS:  Vicodin one to two every four hours p.r.n. for pain.  ACTIVITIES:  As tolerated.  DIET:  Not restricted. DD:  02/11/01 TD:  02/12/01 Job: [id number]

## 2011-03-04 NOTE — Op Note (Signed)
[place]  Patient:    Bianca Campbell, STIRN Visit Number: [id number] MRN: [id number]          Service Type: DSU Location: [phone] Attending Physician:  Sonda Primes Dictated by:   Mardene Celeste. Lurene Shadow, M.D. Proc. Date: 09/03/01 Admit Date:  09/03/2001                             Operative Report  PREOPERATIVE DIAGNOSIS:  Recurrent ventral hernia.  POSTOPERATIVE DIAGNOSIS:  Recurrent ventral hernia.  OPERATION PERFORMED:  Repair of recurrent ventral hernia.  SURGEON:  Mardene Celeste. Lurene Shadow, M.D.  ASSISTANT:  Marnee Spring. Wiliam Ke, M.D.  ANESTHESIA:  General.  INDICATIONS FOR PROCEDURE:  The patient is a 55 year old woman who has had two C-sections in the past.  She then subsequently developed a ventral hernia which was repaired with mesh.  She subsequently developed a bowel obstruction during which time, the mesh had to be removed as her abdomen was closed primarily.  She now returns with a recurrent ventral hernia for repair.  The risks and potential benefits of ventral hernia surgery had been fully discussed with the patient and she understands these risks and gives her full consent.  DESCRIPTION OF PROCEDURE:  Following the induction of satisfactory general anesthetic with the patient positioned supinely, a Foley catheter was placed in the urinary bladder and the abdomen was prepped and draped to be  included in a sterile operative field.  A lower midline incision was then made, deepened through the skin and subcutaneous tissues carrying down to this rather copious hernia sac which was dissected down upon and opened.  Multiple adhesions of small bowel to the hernia sac were taken down and reduced into the peritoneal cavity.  In evaluating the hernia sac there were two additional hernias noted, one superiorly and one inferiorly and laterally on to the right side.  These hernias were also reduced and strong fascial edges on the  lateral sides and superior and inferior side of the defect were cleared of adhesions. The most caudal aspect of the wound was inspected.  There was no evidence of an adherence of the bladder and the uterus was intact.  This area was also cleared.  I then placed an inside adhesive barrier with Seprafilm into the wound and on top of the viscera so as to prevent adherence of the mesh to the viscera.  Then sewed a piece of Prolene mesh which was modeled to fit the defect which was approximately 20 cm by approximately 8 cm in greatest dimensions.  Sponge, instrument and sharp counts were verified.  The mesh was then sewn in with a running #1 Novofil suture.  The repair was intact.  The previously dissected hernia sacs were removed and the dead space closed with running 2-0 Vicryl suture.  Subcutaneous tissues were also closed with running 2-0 Vicryl sutures.  Skin was closed with running 4-0 Monocryl and then reinforced with Steri-Strips.  Sterile dressings applied.  Anesthetic reversed.  Patient removed from the operating room to the recovery room in stable condition having tolerated the procedure well. Dictated by:   Mardene Celeste. Lurene Shadow, M.D. Attending Physician:  Sonda Primes DD:  09/03/01 TD:  09/03/01 Job: [id number]

## 2011-03-04 NOTE — Discharge Summary (Signed)
NAMEDEADRA, Bianca Campbell              ACCOUNT NO.:  [id number]   MEDICAL RECORD NO.:  [id number]          PATIENT TYPE:  INP   LOCATION:  0346                         FACILITY:  [facility]   PHYSICIAN:  Angelia Mould. Derrell Lolling, M.D.DATE OF BIRTH:  November 02, 1955   DATE OF ADMISSION:  09/08/2004  DATE OF DISCHARGE:  09/17/2004                                 DISCHARGE SUMMARY   FINAL DIAGNOSES:  1.  Recurrent ventral hernia with small bowel obstruction.  2.  Atypical chest pain, suspect gastroesophageal reflux disease.  3.  Status post multiple laparotomies.  4.  Hypertension.   OPERATION PERFORMED:  Repair of the current ventral incisional hernia with  mesh.   DATE OF SURGERY:  September 09, 2004.   HISTORY OF PRESENT ILLNESS:  This is a 55 year old black female with a  history of several ventral hernia repairs in the past.  She states that she  has had a lump in her lower abdominal wall for several months.  At 8:00 a.m.  on September 08, 2004, she developed upper abdominal discomfort which  persisted and a tender lump in the lower abdomen just to the right.  She  came to the emergency room, and was vomiting.  A CT scan was done, showing a  ventral hernia containing bowel and stool in the right lower quadrant, but  no signs of perforation or of abscess.  I was asked to see her.   PAST MEDICAL HISTORY:  1.  Two cesarean sections.  2.  Laparotomy for small bowel obstruction in February of 2002.  3.  Laparotomy for recurrent ventral hernia with mesh on September 03, 2001.  4.  Laparotomy for recurrent ventral hernia on Mar 02, 2003.  She had a      prior ventral hernia repair.  5.  Hypertension.  6.  Ovarian cysts.   CURRENT MEDICATIONS:  1.  Norvasc 5 mg daily.  2.  Birth control pills for ovarian cysts.   ALLERGIES:  Drug allergies:  None known.   PHYSICAL EXAMINATION:  GENERAL:  Overweight, black female in mild distress.  VITAL SIGNS:  Temp 98.9, pulse 99, respirations 16, blood pressure  114/79.  NECK:  Supple.  No mass.  LUNGS:  Clear to auscultation.  HEART:  Regular rate and rhythm.  No murmur.  ABDOMEN:  Obese.  Hypoactive bowel sounds.  Not really distended.  Abdomen  was mostly soft and nontender with the exception of a tender abdominal mass  in the lower abdomen below the umbilicus and just to the right.  This was  consistent with the CT-scan findings and incarcerated ventral hernia.  There  was a midline incision and a Pfannenstiel's incision as well.   ADMISSION DATA:  CT scan as described above.  White blood cell count 7400.   HOSPITAL COURSE:  The patient was taken to the operating room, and explored  through the lower midline incision.  She did have an incarcerated, recurrent  ventral hernia containing small bowel, but the small bowel was perfectly  viable.  Adhesions were taken down.  The small bowel was returned to the  peritoneal  cavity.  The defect in the abdominal wall was closed and then a  large sheet of polypropylene mesh was placed on top of this.  Postoperatively, the patient did fairly well.  She had some fever which  appeared to be due to atelectasis, and improved following pulmonary toilet  maneuvers.  On about the third postop day, she had some substernal chest  pain.  EKG showed nonspecific changes, and cardiac enzymes were negative.  Chest x-ray just showed a little bit of atelectasis.  I asked Dr. Corinda Gubler,  hospitalist, to see her.  Dr. Rene Paci did see her.  It was her  feeling that this was not a cardiac problem and most likely GI in etiology.  She followed the patient during her hospital stay, and recommended stress  test when recovered from her surgery, and made arrangements for that.  In  the meantime, we discontinued her H2 blockers for reflux symptoms.  She did  improve.  The patient was discharged on September 17, 2004.  At that time, she  was tolerating a regular diet, had had a bowel movement, and was passing  lots of flatus,  was hungry, comfortable, and wanted to go home.  Abdomen was  soft and not distended with good bowel sounds, and the wounds looked fine.  Jackson-Pratt drains were still draining more than  35 mL a day so they were left in place.  She was asked to follow up with me  in the office in one week.  She was asked to follow up with Dr. Corinda Gubler for  her stress test.  She was given a prescription for Vicodin for pain, and  told to resume her usual medications.     Hayw   HMI/MEDQ  D:  09/24/2004  T:  09/24/2004  Job:  [id number]   cc:   [name], M.D. [facility] L. Vincente Poli, M.D.  [address], [address]  [place] [address]  Fax: [phone]

## 2011-03-04 NOTE — Op Note (Signed)
Bianca Campbell, Bianca Campbell              ACCOUNT NO.:  [id number]   MEDICAL RECORD NO.:  [id number]          PATIENT TYPE:  INP   LOCATION:  0346                         FACILITY:  [facility]   PHYSICIAN:  Angelia Mould. Derrell Lolling, M.D.DATE OF BIRTH:  12-Feb-1956   DATE OF PROCEDURE:  09/09/2004  DATE OF DISCHARGE:                                 OPERATIVE REPORT   PREOPERATIVE DIAGNOSIS:  Incarcerated ventral hernia with a possible small-  bowel obstruction.   POSTOPERATIVE DIAGNOSIS:  Incarcerated ventral hernia with a possible small-  bowel obstruction.   OPERATION PERFORMED:  Repair recurrent, incarcerated ventral hernia with  mesh.   SURGEON:  Angelia Mould. Derrell Lolling, M.D.   OPERATIVE INDICATIONS:  This is a 55 year old white female who has had  several abdominal operations. She has had had 2 Cesarean sections, 1  laparotomy for small-bowel obstruction, and at least 3 ventral hernia  repairs. She presented to the emergency room with a 24-hour history of a  painful lower abdominal mass and vomiting. On exam, she has a tender soft  tissue mass in the abdominal wall below the umbilicus and just off to the  right. The rest of the abdomen is soft. CT scan confirms an incarcerated  ventral hernia with small bowel caught in the wound but evidence of  perforation or abscess. There was a suggestion of small-bowel obstruction  with slightly dilated small bowel. She was brought to the operating room  urgently.   OPERATIVE TECHNIQUE:  Following the induction of general endotracheal  anesthesia, a nasogastric tube and a Foley catheter were placed. Intravenous  antibiotics were given. The abdomen was prepped and draped in a sterile  fashion. Lower midline laparotomy was performed. Dissection was carried down  through the subcutaneous tissue. There was fairly intensive chronic  scarring. We slowly dissected down to the abdominal wall fascia above and  below the mass and then dissected around the incarcerated  hernia sac. I was  eventually able to free this up from the fascial edge and enter the hernia  sac and debride the sac away from the incarcerated small bowel. The small  bowel was pink and viable, and there was no evidence of any inflammatory  process and no exudate. The hernia defect itself was about 4 cm to 5 cm in  diameter. Once I debrided the hernia sac, I was able to free up some  adhesions and reduce the small bowel back into the abdominal cavity. There  was some adhesions underneath which were soft, and I was able to easily  dissect these away from the undersurface of the fascial defect. I closed the  defect in the fascia with 5 interrupted figure-of-eight sutures of #1  Novofil. I found 2 other smaller defects more cephalad and just to the left  of the midline. There was some incarcerated peritoneal fat there which I  debrided. Then I closed these with 0 Prolene sutures.   I undermined the subcutaneous tissue away from the anterior abdominal wall  fascia for about 4 to 5 cm in all directions until I was back to native  fascia laterally. There was  obviously some mesh present centrally in the  wound. The ventral hernia repair was reinforced with an Onlay graft of  Proceed composite polypropylene mesh. I brought an 8-inch x 10-inch piece of  mesh to the operative field and cut it down to size in an elliptical shape  to fill the wound. This was tacked in place with about 16 interrupted  sutures of 0 Prolene placed in a mattress suture fashion. I was careful to  place the side of the mesh which promotes tissue ingrowth toward the fascia.  This provided a very secure coverage without any gaps or redundancy. The  wound was irrigated with saline copiously. There was no bleeding. Two 43-  Jamaica Blake drains were placed in the wound and brought through separate  stab incisions superiorly and sutured to the skin with nylon sutures  connected to suction bulbs. Subcutaneous tissue was  closed  with interrupted sutures of 2-0 Vicryl. Skin was closed with skin staples.  Clean bandages were placed, and the patient was taken to the recovery room  in stable condition. Estimated blood loss was about 100 cc. Complications  none. Sponge, needle, and instrument counts were correct.     Hayw   HMI/MEDQ  D:  09/09/2004  T:  09/09/2004  Job:  [id number]

## 2011-03-04 NOTE — Op Note (Signed)
. [facility]  Patient:    Bianca Campbell, Bianca Campbell                     MRN: [id number] Proc. Date: 11/27/00 Adm. Date:  [id number] Attending:  Sonda Primes                           Operative Report  PREOPERATIVE DIAGNOSIS:  Small bowel obstruction with hernia incarceration.  POSTOPERATIVE DIAGNOSIS:  Adhesive small bowel obstruction with multiple nonincarcerated ventral hernias.  OPERATION PERFORMED:  Exploratory laparotomy with adhesiolysis for small bowel obstruction.  SURGEON:  Mardene Celeste. Lurene Shadow, M.D.  ASSISTANT:  Anselm Pancoast. Zachery Dakins, M.D.  ANESTHESIA:  General.  INDICATIONS FOR PROCEDURE:  The patient is a 55 year old woman with multiple previous abdominal operations, the most recent of which was a hernia repair for a hernia in the lower midline which was repaired with a Marlex plug and mesh.  She became sick approximately three days prior to admission with associated nausea and vomiting and abdominal distention with no passage of flatus.  On admission today she underwent a CT scan which showed a high grade small bowel obstruction.  She is admitted and now brought to the operating room for exploratory laparotomy.  DESCRIPTION OF PROCEDURE:  Following the induction of satisfactory general anesthesia, the patient positioned supinely, the abdomen was routinely prepped and draped to be included in a sterile operative field.  The old midline incision was then opened extending the incision somewhat superior to the umbilicus where an epigastric hernia could be palpated.  The abdomen was carefully opened and multiple adhesions to the anterior abdominal wall dissected free as we opened the abdomen from the epigastrium down to the pubis.  Multiple adhesions to the abdominal side wall were taken down and the region of obstruction delineated as a distal portion of small bowel which was adherent to a portion of a mesh which had been used to close  her previous abdominal wall hernia.  The mesh was dissected free from the abdominal wall and brought up into the wound with the piece of attached bowel.  The mesh was then carefully dissected off of the bowel and discarded.  The bowel was then opened at this point and a pool sucker inserted into the bowel, held with a pursestring suture and the bowel was fully decompressed.  The sucker was removed, the pursestring suture drawn tight and tied and the region then imbricated with interrupted Lembert sutures of 3-0 silk.  The bowel lumen was noted to be widely patent and there was no evidence of leak.  Several other regions of erosion into the bowel wall were also closed with interrupted Lembert sutures imbricating these erosions.  The bowel was then run from the ligament of Treitz to the ileocecal valve.  Additional adhesions to the abdominal wall were lysed.  The epigastric hernia was dissected free and the hernia sac removed and forwarded for pathologic evaluation.  All areas of dissection then checked for hemostasis and noted to be dry.  Sponge, instrument and sharp counts were verified.  The wound was closed in a single midline layer with #1 Novofil.  We elected not to put any mesh into the defect as she had already had the bowel opened.  I used a #1 Novofil to close the abdominal wall.  At the end of the closure, the closure appeared to be intact. Sponge, instrument and sharp counts were again  verified.  Subcutaneous tissues were irrigated with normal saline and the wound closed with stainless steel staples.   A sterile compressive dressing was applied.  Anesthetic reversed. Patient removed from the operating room to the recovery room in stable condition having tolerated the procedure well. DD:  11/27/00 TD:  11/28/00 Job: [id number]

## 2011-03-04 NOTE — H&P (Signed)
NAMENAKEITHA, MILLIGAN              ACCOUNT NO.:  [id number]   MEDICAL RECORD NO.:  [id number]          PATIENT TYPE:  INP   LOCATION:  0101                         FACILITY:  [facility]   PHYSICIAN:  Angelia Mould. Derrell Lolling, M.D.DATE OF BIRTH:  [birth year]   DATE OF ADMISSION:  09/08/2004  DATE OF DISCHARGE:                                HISTORY & PHYSICAL   CHIEF COMPLAINT:  Abdominal pain and vomiting.   HISTORY OF PRESENT ILLNESS:  This is a 55 year old black female who has had  several ventral hernia repairs in the past.  She thinks she has had a lump  in her lower abdominal wall for several months.  At 8 a.m. on September 08, 2004, she developed upper abdominal discomfort, which persisted, and also  developed a tender lump in the lower abdomen just to the right.  She came to  the emergency room, and has vomited once or twice here.  She was seen by the  emergency room physician.  A CT scan was done.  This shows a ventral hernia  containing bowel and stool in the right lower quadrant.  There are no signs  of perforation or abscess.  There is a little bit of dilatation of the small  bowel, suggesting the possibility of a small bowel obstruction.  Her white  blood cell count is 7400.  I was asked to see her.   PAST HISTORY:  1.  She has had 2 cesarean sections in the past.  2.  She had a laparotomy for small bowel obstruction in February of 2002.  3.  She had a laparotomy for repair of recurrent ventral hernia with mesh on      September 03, 2001.  4.  She had a laparotomy for repair of recurrent ventral hernia on December 31, 2002.  She may have had a hernia repair prior to that.  5.  Hypertension.  6.  She is being treated for ovarian cysts.   CURRENT MEDICATIONS:  1.  Norvasc 5 mg daily.  2.  She takes hormones and birth control pills for ovarian cysts.   DRUG ALLERGIES:  None known.   SOCIAL HISTORY:  The patient is married and lives in [place].  They have  2 children.  She is  a housewife.  She denies the use of alcohol or tobacco.   FAMILY HISTORY:  Mother living.  Has diabetes and hypertension and  arthritis.  Father living and has hypertension and arthritis.   REVIEW OF SYSTEMS:  All systems were reviewed.  They were noncontributory,  except as described above.   PHYSICAL EXAMINATION:  GENERAL:  Overweight black female in mild distress.  VITAL SIGNS:  Temperature 98.9, pulse 99, respirations 16, blood pressure  114/79.  HEENT:  Eyes - sclerae clear.  Extraocular movements intact.  Ears, nose,  mouth, and throat - nose, lips, tongue, and oropharynx are without gross  lesions.  NECK:  Supple, nontender.  No mass, no jugular venous distention.  LUNGS:  Clear to auscultation.  No chest wall tenderness.  HEART:  Regular rate and rhythm.  No murmur.  Radial, femoral, and posterior  pulses are palpable.  No peripheral edema.  BREASTS:  Not examined.  ABDOMEN:  Obese.  Hypoactive bowel sounds.  She does not really seem  distended.  The abdomen is mostly soft and nontender, with the exception of  a tender abdominal wall mass in the lower abdomen below the umbilicus to the  right.  This is consistent with an incarcerated ventral hernia.  There is a  Pfannenstiel incision, and a midline incision, as well.  EXTREMITIES:  She moves all 4 extremities well without pain or deformity.  NEUROLOGIC:  No gross motor or sensory deficits.   ASSESSMENT:  Incarcerated recurrent ventral hernia.  Cannot rule out  strangulation.  Cannot rule out small bowel obstruction.   PLAN:  1.  The patient was advised and agrees to undergo laparotomy and repair of      ventral hernia this morning.  2.  I have discussed the indication and details of surgery with her and her      husband.  Risks and complications have been outlined including, but not      limited to, bleeding, infection, recurrence of the hernia, injury to      adjacent organs such as the intestine or bladder or uterus with  major      reconstructive surgery, cardiac, pulmonary, and thromboembolic problems.      She seems to understand these issues well.  At this time, all of her      questions were answered.  She is willing and in agreement to have this      done.     Hayw   HMI/MEDQ  D:  09/09/2004  T:  09/09/2004  Job:  [id number]   cc:   [name], M.D. [facility]. Vincente Poli, M.D.  [address], [address] [place]  [place] [address]  Fax: [phone]

## 2011-05-29 ENCOUNTER — Emergency Department (HOSPITAL_COMMUNITY)
Admission: EM | Admit: 2011-05-29 | Discharge: 2011-05-29 | Disposition: A | Discharge status: Home / Self Care | Payer: 59 | Attending: Emergency Medicine | Admitting: Emergency Medicine

## 2011-05-29 ENCOUNTER — Emergency Department (HOSPITAL_COMMUNITY): Payer: 59

## 2011-05-29 DIAGNOSIS — R0609 Other forms of dyspnea: Secondary | ICD-10-CM | POA: Insufficient documentation

## 2011-05-29 DIAGNOSIS — R0602 Shortness of breath: Secondary | ICD-10-CM | POA: Insufficient documentation

## 2011-05-29 DIAGNOSIS — R110 Nausea: Secondary | ICD-10-CM | POA: Insufficient documentation

## 2011-05-29 DIAGNOSIS — R079 Chest pain, unspecified: Secondary | ICD-10-CM | POA: Insufficient documentation

## 2011-05-29 DIAGNOSIS — I10 Essential (primary) hypertension: Secondary | ICD-10-CM | POA: Insufficient documentation

## 2011-05-29 DIAGNOSIS — R0989 Other specified symptoms and signs involving the circulatory and respiratory systems: Secondary | ICD-10-CM | POA: Insufficient documentation

## 2011-05-29 DIAGNOSIS — Z79899 Other long term (current) drug therapy: Secondary | ICD-10-CM | POA: Insufficient documentation

## 2011-05-29 DIAGNOSIS — R61 Generalized hyperhidrosis: Secondary | ICD-10-CM | POA: Insufficient documentation

## 2011-05-29 DIAGNOSIS — R42 Dizziness and giddiness: Secondary | ICD-10-CM | POA: Insufficient documentation

## 2011-05-29 LAB — POCT I-STAT TROPONIN I
Troponin i, poc: 0 ng/mL (ref 0.00–0.08)
Troponin i, poc: 0.01 ng/mL (ref 0.00–0.08)
Troponin i, poc: 0 ng/mL (ref 0.00–0.08)

## 2011-05-29 LAB — POCT I-STAT, CHEM 8
HCT: 46 % (ref 36.0–46.0)
Hemoglobin: 15.6 g/dL — ABNORMAL HIGH (ref 12.0–15.0)
Potassium: 3.8 mEq/L (ref 3.5–5.1)
Sodium: 137 mEq/L (ref 135–145)

## 2011-05-29 LAB — DIFFERENTIAL
Basophils Absolute: 0.1 10*3/uL (ref 0.0–0.1)
Eosinophils Absolute: 0.2 10*3/uL (ref 0.0–0.7)
Lymphocytes Relative: 54 % — ABNORMAL HIGH (ref 12–46)
Neutro Abs: 2.9 10*3/uL (ref 1.7–7.7)

## 2011-05-29 LAB — CBC
MCH: 27.1 pg (ref 26.0–34.0)
MCHC: 34.6 g/dL (ref 30.0–36.0)
Platelets: ADEQUATE 10*3/uL (ref 150–400)
RBC: 5.2 MIL/uL — ABNORMAL HIGH (ref 3.87–5.11)

## 2011-05-29 LAB — BASIC METABOLIC PANEL
CO2: 27 mEq/L (ref 19–32)
Calcium: 10.4 mg/dL (ref 8.4–10.5)
Potassium: 4.2 mEq/L (ref 3.5–5.1)
Sodium: 134 mEq/L — ABNORMAL LOW (ref 135–145)

## 2011-05-30 ENCOUNTER — Other Ambulatory Visit (HOSPITAL_COMMUNITY): Payer: Self-pay | Admitting: Cardiology

## 2011-06-22 ENCOUNTER — Emergency Department (HOSPITAL_COMMUNITY): Payer: 59

## 2011-06-22 ENCOUNTER — Emergency Department (HOSPITAL_COMMUNITY)
Admission: EM | Admit: 2011-06-22 | Discharge: 2011-06-22 | Disposition: A | Discharge status: Home / Self Care | Payer: 59 | Attending: Emergency Medicine | Admitting: Emergency Medicine

## 2011-06-22 DIAGNOSIS — F411 Generalized anxiety disorder: Secondary | ICD-10-CM | POA: Insufficient documentation

## 2011-06-22 DIAGNOSIS — I10 Essential (primary) hypertension: Secondary | ICD-10-CM | POA: Insufficient documentation

## 2011-06-22 DIAGNOSIS — R109 Unspecified abdominal pain: Secondary | ICD-10-CM | POA: Insufficient documentation

## 2011-06-22 DIAGNOSIS — R112 Nausea with vomiting, unspecified: Secondary | ICD-10-CM | POA: Insufficient documentation

## 2011-06-22 DIAGNOSIS — R10816 Epigastric abdominal tenderness: Secondary | ICD-10-CM | POA: Insufficient documentation

## 2011-06-22 LAB — COMPREHENSIVE METABOLIC PANEL
Albumin: 4.3 g/dL (ref 3.5–5.2)
Alkaline Phosphatase: 105 U/L (ref 39–117)
BUN: 9 mg/dL (ref 6–23)
CO2: 25 mEq/L (ref 19–32)
Chloride: 98 mEq/L (ref 96–112)
GFR calc non Af Amer: >60 mL/min (ref >60)
Glucose, Bld: 125 mg/dL — ABNORMAL HIGH (ref 70–99)
Potassium: 3.8 mEq/L (ref 3.5–5.1)
Total Bilirubin: 0.4 mg/dL (ref 0.3–1.2)

## 2011-06-22 LAB — URINALYSIS, ROUTINE W REFLEX MICROSCOPIC
Glucose, UA: NEGATIVE mg/dL
Ketones, ur: NEGATIVE mg/dL
Leukocytes, UA: NEGATIVE
Protein, ur: NEGATIVE mg/dL

## 2011-06-22 LAB — DIFFERENTIAL
Basophils Relative: 0 % (ref 0–1)
Eosinophils Absolute: 0.1 10*3/uL (ref 0.0–0.7)
Lymphs Abs: 1.7 10*3/uL (ref 0.7–4.0)
Monocytes Relative: 4 % (ref 3–12)
Neutro Abs: 5.2 10*3/uL (ref 1.7–7.7)
Neutrophils Relative %: 71 % (ref 43–77)

## 2011-06-22 LAB — CBC
Hemoglobin: 13.1 g/dL (ref 12.0–15.0)
MCV: 79.9 fL (ref 78.0–100.0)
Platelets: 421 10*3/uL — ABNORMAL HIGH (ref 150–400)
RBC: 4.93 MIL/uL (ref 3.87–5.11)
WBC: 7.3 10*3/uL (ref 4.0–10.5)

## 2011-06-22 LAB — OCCULT BLOOD, POC DEVICE: Fecal Occult Bld: NEGATIVE

## 2011-06-23 LAB — URINE CULTURE: Colony Count: 30000

## 2011-06-24 ENCOUNTER — Other Ambulatory Visit (HOSPITAL_COMMUNITY): Payer: 59

## 2011-06-24 ENCOUNTER — Ambulatory Visit (INDEPENDENT_AMBULATORY_CARE_PROVIDER_SITE_OTHER): Payer: 59 | Admitting: Internal Medicine

## 2011-06-24 ENCOUNTER — Encounter: Payer: Self-pay | Admitting: Internal Medicine

## 2011-06-24 ENCOUNTER — Other Ambulatory Visit (INDEPENDENT_AMBULATORY_CARE_PROVIDER_SITE_OTHER): Payer: 59

## 2011-06-24 ENCOUNTER — Telehealth: Payer: Self-pay

## 2011-06-24 VITALS — BP 120/78 | HR 94 | Temp 98.1°F | Ht 60.0 in | Wt 211.0 lb

## 2011-06-24 DIAGNOSIS — R7309 Other abnormal glucose: Secondary | ICD-10-CM

## 2011-06-24 DIAGNOSIS — R7302 Impaired glucose tolerance (oral): Secondary | ICD-10-CM

## 2011-06-24 DIAGNOSIS — K529 Noninfective gastroenteritis and colitis, unspecified: Secondary | ICD-10-CM | POA: Insufficient documentation

## 2011-06-24 DIAGNOSIS — Z0001 Encounter for general adult medical examination with abnormal findings: Secondary | ICD-10-CM | POA: Insufficient documentation

## 2011-06-24 DIAGNOSIS — Z0000 Encounter for general adult medical examination without abnormal findings: Secondary | ICD-10-CM

## 2011-06-24 DIAGNOSIS — R079 Chest pain, unspecified: Secondary | ICD-10-CM | POA: Insufficient documentation

## 2011-06-24 DIAGNOSIS — K5289 Other specified noninfective gastroenteritis and colitis: Secondary | ICD-10-CM

## 2011-06-24 LAB — CBC WITH DIFFERENTIAL/PLATELET
Basophils Absolute: 0 10*3/uL (ref 0.0–0.1)
HCT: 37.5 % (ref 36.0–46.0)
Lymphs Abs: 2.2 10*3/uL (ref 0.7–4.0)
MCV: 82.2 fl (ref 78.0–100.0)
Monocytes Absolute: 0.5 10*3/uL (ref 0.1–1.0)
Platelets: 335 10*3/uL (ref 150.0–400.0)
RDW: 14.1 % (ref 11.5–14.6)

## 2011-06-24 LAB — BASIC METABOLIC PANEL
Chloride: 104 mEq/L (ref 96–112)
Potassium: 3.9 mEq/L (ref 3.5–5.1)

## 2011-06-24 LAB — LIPID PANEL
Cholesterol: 171 mg/dL (ref 0–200)
LDL Cholesterol: 86 mg/dL (ref 0–99)
Total CHOL/HDL Ratio: 2

## 2011-06-24 LAB — HEPATIC FUNCTION PANEL
ALT: 17 U/L (ref 0–35)
AST: 19 U/L (ref 0–37)
Alkaline Phosphatase: 83 U/L (ref 39–117)
Bilirubin, Direct: 0.1 mg/dL (ref 0.0–0.3)
Total Bilirubin: 0.7 mg/dL (ref 0.3–1.2)

## 2011-06-24 LAB — URINALYSIS, ROUTINE W REFLEX MICROSCOPIC
Bilirubin Urine: NEGATIVE
Hgb urine dipstick: NEGATIVE
Ketones, ur: NEGATIVE
Nitrite: NEGATIVE
Total Protein, Urine: NEGATIVE

## 2011-06-24 MED ORDER — AMLODIPINE-OLMESARTAN 10-40 MG PO TABS: 1.0000 | ORAL_TABLET | Freq: Every day | ORAL | Status: DC

## 2011-06-24 MED ORDER — OMEPRAZOLE 20 MG PO TBEC: 1.0000 | DELAYED_RELEASE_TABLET | Freq: Every day | ORAL | Status: DC

## 2011-06-24 MED ORDER — NITROGLYCERIN 0.3 MG SL SUBL: 0.3000 mg | SUBLINGUAL_TABLET | SUBLINGUAL | Status: DC | PRN

## 2011-06-24 NOTE — Assessment & Plan Note (Signed)
Asympt, encourage pt to fill med prescriptions (sent myself to her pharmacy on behalf of Dr Sharyn Lull today); reassured - ok to take with her usual meds

## 2011-06-24 NOTE — Assessment & Plan Note (Signed)
Asympt, likely stress related to recent AGE like illness, for a1c but doubt will need OHA,  To focus on diet and wt loss

## 2011-06-24 NOTE — Patient Instructions (Addendum)
Take all new medications as prescribed Continue all other medications as before Please go to LAB in the Basement for the blood and/or urine tests to be done today Please call the phone number [phone] (the [employer] System) for results of testing in 2-3 days;  When calling, simply dial the number, and when prompted enter the MRN number above (the Medical Record Number) and the # key, then the message should start. Please see Dr Magod/GI to set up the screening colonoscopy Please remember to followup with your GYN for the yearly pap smear and/or mammogram, in November as you normally do, as well as re-schedule your stress test with Dr Sharyn Lull We will fax your labs later today to Dr Sharyn Lull Please return in 6 mo with Lab testing done 3-5 days before

## 2011-06-24 NOTE — Assessment & Plan Note (Signed)
Aug 2012 calcium normal, no prior hx of same,  Most recent approx 11.7 on sept 5, will re-check today, as well as PTH level, but suspect due to dehydration, should improve with resolution of other AGE today

## 2011-06-24 NOTE — Progress Notes (Signed)
Subjective:    Patient ID: Bianca Campbell, female    DOB: November 16, 1955, 55 y.o.   MRN: [id number]  HPI   Here for wellness and f/u;  Overall doing ok;  Pt denies CP, worsening SOB, DOE, wheezing, orthopnea, PND, worsening LE edema, palpitations, dizziness or syncope.  Pt denies neurological change such as new Headache, facial or extremity weakness.  Pt denies polydipsia, polyuria, or low sugar symptoms. Pt states overall good compliance with treatment and medications, good tolerability, and trying to follow lower cholesterol diet.  Pt denies worsening depressive symptoms, suicidal ideation or panic. No fever, wt loss, night sweats, loss of appetite, or other constitutional symptoms.  Pt states good ability with ADL's, low fall risk, home safety reviewed and adequate, no significant changes in hearing or vision, and occasionally active with exercise.  Also Here to f/u after being seen in the [facility] ER sept 5, with c/o crampy abd pain, n/v and diarrhea and loose stool and noted elevated calcium > 11  Had stopped her calcium supplement for about a wk prior to illness, but was taking TUMS fairly frequently.  Was hemoccult neg, film neg for recurrent bowel obstruction, and labs o/w normal  - WBC, HGB, UA, LFT's, creatinine.  Smptoms since then have improved with no fever, further n/v and diarrhea, but some abd sorness persists mild, some worse lats night and to sleep on her side somewhat propped by a pillow.  No radiation, and diet back to usual.  Has not had to use the hydrocodone or zofran today for symtpoms.   Incidentally was seen per Cardiology/Dr Harwan with chest pain, tx with ntg sl prn, and omeprazole 20 mg (has not started either due to above) and stress test was due for today but she plans to re-schedule due to fatigue with this illness. Pt denies chest pain, increased sob or doe, wheezing, orthopnea, PND, increased LE swelling, palpitations, dizziness or syncope.  Pt denies new neurological symptoms such  as new headache, or facial or extremity weakness or numbness.   Pt denies polydipsia, polyuria.  Presents lab order per cardiology  - lipids, bmp, and HFP.   Pt denies fever, wt loss, night sweats, loss of appetite, or other constitutional symptoms Past Medical History  Diagnosis Date  . ALLERGIC RHINITIS 09/11/2007  . BACK PAIN 11/14/2007  . EXTERNAL OTITIS 09/11/2007  . GERD 06/06/2007  . GOUT 11/14/2007  . HYPERTENSION 06/06/2007  . INTERMITTENT VERTIGO 09/11/2007  . Irritable bowel syndrome 06/06/2007  . LOW BACK PAIN 06/06/2007  . OSTEOARTHRITIS 06/06/2007  . SHOULDER PAIN, RIGHT 12/30/2008   Past Surgical History  Procedure Date  . Cesarean section     x 2  . Cholecystectomy   . Inguinal herniorrhapy   . Exploratory laparotomy   . S/p incarcerated hernia and ovary cyst jan 2011     reports that she has never smoked. She does not have any smokeless tobacco history on file. She reports that she does not drink alcohol. Her drug history not on file. family history includes Arthritis in her mother and Colon polyps (age of onset:68) in her father. No Known Allergies No current outpatient prescriptions on file prior to visit.   Review of Systems Review of Systems  Constitutional: Negative for diaphoresis and unexpected weight change.  HENT: Negative for drooling and tinnitus.   Eyes: Negative for photophobia and visual disturbance.  Respiratory: Negative for choking and stridor.   Genitourinary: Negative for hematuria and decreased urine volume.  Musculoskeletal: Negative for  gait problem.  Skin: Negative for color change and wound.  Neurological: Negative for tremors and numbness.  Psychiatric/Behavioral: Negative for decreased concentration. The patient is not hyperactive.      Objective:   Physical Exam BP 120/78  Pulse 94  Temp(Src) 98.1 F (36.7 C) (Oral)  Ht 5' (1.524 m)  Wt 211 lb (95.709 kg)  BMI 41.21 kg/m2  SpO2 93% Physical Exam  VS noted, obese Constitutional:  Pt is oriented to person, place, and time. Appears well-developed and well-nourished.  Head: Normocephalic and atraumatic.  Right Ear: External ear normal.  Left Ear: External ear normal.  Nose: Nose normal.  Mouth/Throat: Oropharynx is clear and moist.  Eyes: Conjunctivae and EOM are normal. Pupils are equal, round, and reactive to light.  Neck: Normal range of motion. Neck supple. No JVD present. No tracheal deviation present.  Cardiovascular: Normal rate, regular rhythm, normal heart sounds and intact distal pulses.   Pulmonary/Chest: Effort normal and breath sounds normal.  Abdominal: Soft. Bowel sounds are normal. There is no tenderness. + BS - benign exam Musculoskeletal: Normal range of motion. Exhibits no edema.  Lymphadenopathy:  Has no cervical adenopathy.  Neurological: Pt is alert and oriented to person, place, and time. Pt has normal reflexes. No cranial nerve deficit.  Skin: Skin is warm and dry. No rash noted.  Psychiatric:  Has  normal mood and affect. Behavior is normal. 1+ nervous       Assessment & Plan:

## 2011-06-24 NOTE — Telephone Encounter (Signed)
Faxed copy of labs to Dr. Annitta Jersey office at [phone]

## 2011-06-24 NOTE — Assessment & Plan Note (Signed)
Overall doing well, age appropriate education and counseling updated, referrals for preventative services and immunizations addressed, dietary and smoking counseling addressed, most recent labs and ECG reviewed.  I have personally reviewed and have noted: 1) the patient's medical and social history 2) The pt's use of alcohol, tobacco, and illicit drugs 3) The patient's current medications and supplements 4) Functional ability including ADL's, fall risk, home safety risk, hearing and visual impairment 5) Diet and physical activities 6) Evidence for depression or mood disorder 7) The patient's height, weight, and BMI have been recorded in the chart I have made referrals, and provided counseling and education based on review of the above Declines flu shot today Plans to f/u with Dr Magod/GI for colonscopy screening

## 2011-06-24 NOTE — Assessment & Plan Note (Signed)
Suspect recent AGE, though cant r/o flare of IBS as well, pt to f/u with Dr Ewing Schlein as planned

## 2011-06-25 ENCOUNTER — Encounter: Payer: Self-pay | Admitting: Internal Medicine

## 2011-06-27 LAB — PTH, INTACT AND CALCIUM: PTH: 43.9 pg/mL (ref 14.0–72.0)

## 2011-08-24 ENCOUNTER — Other Ambulatory Visit: Payer: Self-pay | Admitting: *Deleted

## 2011-08-24 MED ORDER — TRIAMCINOLONE ACETONIDE 0.1 % EX CREA: TOPICAL_CREAM | Freq: Two times a day (BID) | CUTANEOUS | Status: DC

## 2011-08-24 NOTE — Telephone Encounter (Signed)
Done per emr  Ok for tylenol arthritis OTC asd on the bottle

## 2011-08-24 NOTE — Telephone Encounter (Signed)
Refill Request for cream given for Rash in Ear. Request to know what Pt can take for Arthritis pain w/her other meds.

## 2011-08-24 NOTE — Telephone Encounter (Signed)
Patient informed of prescription sent in for rash and instructions on OTC medication.

## 2011-12-22 ENCOUNTER — Ambulatory Visit: Payer: 59 | Admitting: Internal Medicine

## 2011-12-22 DIAGNOSIS — Z0289 Encounter for other administrative examinations: Secondary | ICD-10-CM

## 2012-02-23 ENCOUNTER — Other Ambulatory Visit (INDEPENDENT_AMBULATORY_CARE_PROVIDER_SITE_OTHER): Payer: 59

## 2012-02-23 ENCOUNTER — Encounter: Payer: Self-pay | Admitting: Internal Medicine

## 2012-02-23 ENCOUNTER — Ambulatory Visit (INDEPENDENT_AMBULATORY_CARE_PROVIDER_SITE_OTHER): Payer: 59 | Admitting: Internal Medicine

## 2012-02-23 VITALS — BP 112/80 | HR 74 | Temp 98.0°F | Ht 60.0 in | Wt 206.2 lb

## 2012-02-23 DIAGNOSIS — R7302 Impaired glucose tolerance (oral): Secondary | ICD-10-CM

## 2012-02-23 DIAGNOSIS — Z0000 Encounter for general adult medical examination without abnormal findings: Secondary | ICD-10-CM

## 2012-02-23 DIAGNOSIS — J309 Allergic rhinitis, unspecified: Secondary | ICD-10-CM

## 2012-02-23 DIAGNOSIS — R7309 Other abnormal glucose: Secondary | ICD-10-CM

## 2012-02-23 DIAGNOSIS — I10 Essential (primary) hypertension: Secondary | ICD-10-CM

## 2012-02-23 LAB — HEPATIC FUNCTION PANEL
Albumin: 4.1 g/dL (ref 3.5–5.2)
Alkaline Phosphatase: 89 U/L (ref 39–117)

## 2012-02-23 LAB — CBC WITH DIFFERENTIAL/PLATELET
Basophils Relative: 1.1 % (ref 0.0–3.0)
Eosinophils Relative: 4.7 % (ref 0.0–5.0)
HCT: 41 % (ref 36.0–46.0)
Hemoglobin: 13.3 g/dL (ref 12.0–15.0)
Lymphocytes Relative: 45.2 % (ref 12.0–46.0)
Lymphs Abs: 2.7 10*3/uL (ref 0.7–4.0)
Monocytes Relative: 8.7 % (ref 3.0–12.0)
Neutro Abs: 2.4 10*3/uL (ref 1.4–7.7)
RBC: 4.98 Mil/uL (ref 3.87–5.11)
WBC: 5.9 10*3/uL (ref 4.5–10.5)

## 2012-02-23 LAB — URINALYSIS, ROUTINE W REFLEX MICROSCOPIC
Ketones, ur: NEGATIVE
Specific Gravity, Urine: 1.02 (ref 1.000–1.030)
Urine Glucose: NEGATIVE
pH: 6 (ref 5.0–8.0)

## 2012-02-23 LAB — LIPID PANEL
HDL: 85.9 mg/dL (ref >39.00)
Triglycerides: 45 mg/dL (ref 0.0–149.0)
VLDL: 9 mg/dL (ref 0.0–40.0)

## 2012-02-23 LAB — BASIC METABOLIC PANEL
BUN: 10 mg/dL (ref 6–23)
Calcium: 9.7 mg/dL (ref 8.4–10.5)
Creatinine, Ser: 0.5 mg/dL (ref 0.4–1.2)
GFR: 168.69 mL/min (ref >60.00)
Glucose, Bld: 97 mg/dL (ref 70–99)
Sodium: 139 mEq/L (ref 135–145)

## 2012-02-23 LAB — HEMOGLOBIN A1C: Hgb A1c MFr Bld: 6.3 % (ref 4.6–6.5)

## 2012-02-23 MED ORDER — METHYLPREDNISOLONE ACETATE 80 MG/ML IJ SUSP
120.0000 mg | Freq: Once | INTRAMUSCULAR | Status: AC
  Administered 2012-02-23: 120 mg via INTRAMUSCULAR

## 2012-02-23 MED ORDER — FEXOFENADINE HCL 180 MG PO TABS: 180.0000 mg | ORAL_TABLET | Freq: Every day | ORAL | Status: DC

## 2012-02-23 MED ORDER — FLUTICASONE PROPIONATE 50 MCG/ACT NA SUSP: 2.0000 | Freq: Every day | NASAL | Status: DC

## 2012-02-23 NOTE — Patient Instructions (Addendum)
You had the steroid shot today Take all new medications as prescribed - the generic allegra and flonase Continue all other medications as before

## 2012-02-26 ENCOUNTER — Encounter: Payer: Self-pay | Admitting: Internal Medicine

## 2012-02-26 MED ORDER — CELECOXIB 200 MG PO CAPS: 200.0000 mg | ORAL_CAPSULE | Freq: Every day | ORAL | Status: AC | PRN

## 2012-02-26 NOTE — Progress Notes (Signed)
Subjective:    Patient ID: Bianca Campbell, female    DOB: 1955-12-02, 56 y.o.   MRN: [id number]  HPI  Here with several wks ongoing nasal allergy symptoms with clear congestion, itch and sneeze, without fever, pain, ST, cough or wheezing.  Pt denies chest pain, increased sob or doe, wheezing, orthopnea, PND, increased LE swelling, palpitations, dizziness or syncope.   Pt denies polydipsia, polyuria Pt states overall good compliance with meds, trying to follow lower cholesterol, diabetic diet, wt overall stable but little exercise however, hard to lose wt.  Pt denies new neurological symptoms such as new headache, or facial or extremity weakness or numbness    Pt denies fever, wt loss, night sweats, loss of appetite, or other constitutional symptoms Past Medical History  Diagnosis Date  . ALLERGIC RHINITIS 09/11/2007  . BACK PAIN 11/14/2007  . EXTERNAL OTITIS 09/11/2007  . GERD 06/06/2007  . GOUT 11/14/2007  . HYPERTENSION 06/06/2007  . INTERMITTENT VERTIGO 09/11/2007  . Irritable bowel syndrome 06/06/2007  . LOW BACK PAIN 06/06/2007  . OSTEOARTHRITIS 06/06/2007  . SHOULDER PAIN, RIGHT 12/30/2008   Past Surgical History  Procedure Date  . Cesarean section     x 2  . Cholecystectomy   . Inguinal herniorrhapy   . Exploratory laparotomy   . S/p incarcerated hernia and ovary cyst jan 2011     reports that she has never smoked. She does not have any smokeless tobacco history on file. She reports that she does not drink alcohol. Her drug history not on file. family history includes Arthritis in her mother and Colon polyps (age of onset:68) in her father. No Known Allergies Current Outpatient Prescriptions on File Prior to Visit  Medication Sig Dispense Refill  . amLODipine-olmesartan (AZOR) 10-40 MG per tablet Take 1 tablet by mouth daily.  90 tablet  3  . aspirin 81 MG tablet Take 81 mg by mouth daily.        . nitroGLYCERIN (NITROSTAT) 0.3 MG SL tablet Place 1 tablet (0.3 mg total) under the  tongue every 5 (five) minutes as needed for chest pain.  90 tablet  12  . Omeprazole 20 MG TBEC Take 1 tablet (20 mg total) by mouth daily.  90 each  3  . triamcinolone (KENALOG) 0.1 % cream Apply topically 2 (two) times daily.  30 g  0  . fexofenadine (ALLEGRA) 180 MG tablet Take 1 tablet (180 mg total) by mouth daily.  30 tablet  2  . fluticasone (FLONASE) 50 MCG/ACT nasal spray Place 2 sprays into the nose daily.  16 g  2   Review of Systems Constitutional: Negative for diaphoresis and unexpected weight change.  HENT: Negative for drooling and tinnitus.   Eyes: Negative for photophobia and visual disturbance.  Respiratory: Negative for choking and stridor.   Gastrointestinal: Negative for vomiting and blood in stool.  Genitourinary: Negative for hematuria and decreased urine volume.   Skin: Negative for color change and wound.  Neurological: Negative for tremors and numbness.     Objective:   Physical Exam BP 112/80  Pulse 74  Temp(Src) 98 F (36.7 C) (Oral)  Ht 5' (1.524 m)  Wt 206 lb 4 oz (93.554 kg)  BMI 40.28 kg/m2  SpO2 97% Physical Exam  VS noted, not ill appearing Constitutional: Pt appears well-developed and well-nourished.  HENT: Head: Normocephalic.  Right Ear: External ear normal.  Left Ear: External ear normal.  Bilat tm's mild erythema.  Sinus nontender.  Pharynx mild erythema Eyes:  Conjunctivae and EOM are normal. Pupils are equal, round, and reactive to light.  Neck: Normal range of motion. Neck supple.  Cardiovascular: Normal rate and regular rhythm.   Pulmonary/Chest: Effort normal and breath sounds normal.  Neurological: Pt is alert. Not confused Skin: Skin is warm. No erythema. No rash Psychiatric: Pt behavior is normal. Thought content normal.1+ nervous     Assessment & Plan:

## 2012-02-26 NOTE — Assessment & Plan Note (Signed)
stable overall by hx and exam, most recent data reviewed with pt, and pt to continue medical treatment as before BP Readings from Last 3 Encounters:  02/23/12 112/80  06/24/11 120/78  02/24/10 120/92

## 2012-02-26 NOTE — Assessment & Plan Note (Signed)
stable overall by hx and exam, most recent data reviewed with pt, and pt to continue medical treatment as before Lab Results  Component Value Date   HGBA1C 6.3 02/23/2012    

## 2012-02-26 NOTE — Assessment & Plan Note (Signed)
Mild to mod, for depomedrol IM, and allegra/flonase asd ,   to f/u any worsening symptoms or concerns 

## 2012-03-19 ENCOUNTER — Ambulatory Visit (INDEPENDENT_AMBULATORY_CARE_PROVIDER_SITE_OTHER): Payer: 59 | Admitting: Internal Medicine

## 2012-03-19 ENCOUNTER — Encounter: Payer: Self-pay | Admitting: Internal Medicine

## 2012-03-19 VITALS — BP 132/80 | HR 77 | Temp 98.6°F | Ht 60.0 in | Wt 209.5 lb

## 2012-03-19 DIAGNOSIS — R7302 Impaired glucose tolerance (oral): Secondary | ICD-10-CM

## 2012-03-19 DIAGNOSIS — M25561 Pain in right knee: Secondary | ICD-10-CM

## 2012-03-19 DIAGNOSIS — M25569 Pain in unspecified knee: Secondary | ICD-10-CM

## 2012-03-19 DIAGNOSIS — R7309 Other abnormal glucose: Secondary | ICD-10-CM

## 2012-03-19 DIAGNOSIS — I10 Essential (primary) hypertension: Secondary | ICD-10-CM

## 2012-03-19 MED ORDER — HYDROCODONE-ACETAMINOPHEN 7.5-325 MG PO TABS: 1.0000 | ORAL_TABLET | Freq: Four times a day (QID) | ORAL | Status: AC | PRN

## 2012-03-19 NOTE — Patient Instructions (Addendum)
Take all new medications as prescribed Continue all other medications as before You will be contacted regarding the referral for: orthopedic, and MRI for right knee

## 2012-03-24 ENCOUNTER — Ambulatory Visit
Admission: RE | Admit: 2012-03-24 | Discharge: 2012-03-24 | Disposition: A | Discharge status: Home / Self Care | Payer: 59 | Source: Ambulatory Visit | Attending: Internal Medicine | Admitting: Internal Medicine

## 2012-03-24 DIAGNOSIS — M25561 Pain in right knee: Secondary | ICD-10-CM

## 2012-03-25 ENCOUNTER — Encounter: Payer: Self-pay | Admitting: Internal Medicine

## 2012-03-25 DIAGNOSIS — M25561 Pain in right knee: Secondary | ICD-10-CM | POA: Insufficient documentation

## 2012-03-25 NOTE — Progress Notes (Signed)
Subjective:    Patient ID: Bianca Campbell, female    DOB: 1956-09-07, 56 y.o.   MRN: [id number]  HPI   Here with right knee pain grad worse x 2-3 wks, now mod to severe, sharp, sometimes with numbness and swelling, worse after sitting and then standing up, worse to to drive, no giveaways or falls. No trauma, fever.  Pt denies chest pain, increased sob or doe, wheezing, orthopnea, PND, increased LE swelling, palpitations, dizziness or syncope.  Pt denies new neurological symptoms such as new headache, or facial or extremity weakness or numbness   Pt denies polydipsia, polyuria.   Pt denies fever, wt loss, night sweats, loss of appetite, or other constitutional symptoms.  No other acute complaints Past Medical History  Diagnosis Date  . ALLERGIC RHINITIS 09/11/2007  . BACK PAIN 11/14/2007  . EXTERNAL OTITIS 09/11/2007  . GERD 06/06/2007  . GOUT 11/14/2007  . HYPERTENSION 06/06/2007  . INTERMITTENT VERTIGO 09/11/2007  . Irritable bowel syndrome 06/06/2007  . LOW BACK PAIN 06/06/2007  . OSTEOARTHRITIS 06/06/2007  . SHOULDER PAIN, RIGHT 12/30/2008   Past Surgical History  Procedure Date  . Cesarean section     x 2  . Cholecystectomy   . Inguinal herniorrhapy   . Exploratory laparotomy   . S/p incarcerated hernia and ovary cyst jan 2011     reports that she has never smoked. She does not have any smokeless tobacco history on file. She reports that she does not drink alcohol. Her drug history not on file. family history includes Arthritis in her mother and Colon polyps (age of onset:68) in her father. No Known Allergies Current Outpatient Prescriptions on File Prior to Visit  Medication Sig Dispense Refill  . amLODipine-olmesartan (AZOR) 10-40 MG per tablet Take 1 tablet by mouth daily.  90 tablet  3  . aspirin 81 MG tablet Take 81 mg by mouth daily.        . celecoxib (CELEBREX) 200 MG capsule Take 1 capsule (200 mg total) by mouth daily as needed for pain.  30 capsule  11  . fexofenadine  (ALLEGRA) 180 MG tablet Take 1 tablet (180 mg total) by mouth daily.  30 tablet  2  . fluticasone (FLONASE) 50 MCG/ACT nasal spray Place 2 sprays into the nose daily.  16 g  2  . nitroGLYCERIN (NITROSTAT) 0.3 MG SL tablet Place 1 tablet (0.3 mg total) under the tongue every 5 (five) minutes as needed for chest pain.  90 tablet  12  . Omeprazole 20 MG TBEC Take 1 tablet (20 mg total) by mouth daily.  90 each  3  . triamcinolone (KENALOG) 0.1 % cream Apply topically 2 (two) times daily.  30 g  0   Review of Systems Review of Systems  Constitutional: Negative for diaphoresis and unexpected weight change.  Eyes: Negative for photophobia and visual disturbance.  Respiratory: Negative for cough Gastrointestinal: Negative for vomiting and blood in stool.  Genitourinary: Negative for hematuria and decreased urine volume.  Skin: Negative for color change and wound.  Neurological: Negative for tremors and numbness.  MSK:  Neg for other joint pain or swelling      Objective:   Physical Exam BP 132/80  Pulse 77  Temp(Src) 98.6 F (37 C) (Oral)  Ht 5' (1.524 m)  Wt 209 lb 8 oz (95.029 kg)  BMI 40.92 kg/m2  SpO2 99% Physical Exam  VS noted Constitutional: Pt appears well-developed and well-nourished.  HENT: Head: Normocephalic.  Right Ear: External  ear normal.  Left Ear: External ear normal.  Eyes: Conjunctivae and EOM are normal. Pupils are equal, round, and reactive to light.  Neck: Normal range of motion. Neck supple.  Cardiovascular: Normal rate and regular rhythm.   Pulmonary/Chest: Effort normal and breath sounds normal.  Right knee with 1+ effusion, lateral tender, decr ROM with small lateral click Gait: severe antalgic favoring the right Neurological: Pt is alert. Not confused  Skin: Skin is warm. No erythema.     Assessment & Plan:

## 2012-03-25 NOTE — Assessment & Plan Note (Signed)
stable overall by hx and exam, most recent data reviewed with pt, and pt to continue medical treatment as before BP Readings from Last 3 Encounters:  03/19/12 132/80  02/23/12 112/80  06/24/11 120/78

## 2012-03-25 NOTE — Assessment & Plan Note (Signed)
Higher suspicion for cartilage tear; for MRI and ortho referral, pain control,  to f/u any worsening symptoms or concerns

## 2012-03-25 NOTE — Assessment & Plan Note (Signed)
stable overall by hx and exam, most recent data reviewed with pt, and pt to continue medical treatment as before Lab Results  Component Value Date   HGBA1C 6.3 02/23/2012

## 2012-03-26 ENCOUNTER — Encounter: Payer: Self-pay | Admitting: Internal Medicine

## 2012-04-15 ENCOUNTER — Emergency Department (HOSPITAL_COMMUNITY)
Admission: EM | Admit: 2012-04-15 | Discharge: 2012-04-16 | Disposition: A | Discharge status: Home / Self Care | Payer: 59 | Attending: Emergency Medicine | Admitting: Emergency Medicine

## 2012-04-15 ENCOUNTER — Emergency Department (HOSPITAL_COMMUNITY): Payer: 59

## 2012-04-15 ENCOUNTER — Encounter (HOSPITAL_COMMUNITY): Payer: Self-pay

## 2012-04-15 DIAGNOSIS — R079 Chest pain, unspecified: Secondary | ICD-10-CM | POA: Insufficient documentation

## 2012-04-15 DIAGNOSIS — I10 Essential (primary) hypertension: Secondary | ICD-10-CM | POA: Insufficient documentation

## 2012-04-15 DIAGNOSIS — R0602 Shortness of breath: Secondary | ICD-10-CM | POA: Insufficient documentation

## 2012-04-15 DIAGNOSIS — R0789 Other chest pain: Secondary | ICD-10-CM | POA: Insufficient documentation

## 2012-04-15 LAB — POCT I-STAT, CHEM 8
BUN: 22 mg/dL (ref 6–23)
Chloride: 105 mEq/L (ref 96–112)
Creatinine, Ser: 0.7 mg/dL (ref 0.50–1.10)
Sodium: 139 mEq/L (ref 135–145)
TCO2: 25 mmol/L (ref 0–100)

## 2012-04-15 LAB — CBC
Hemoglobin: 11.8 g/dL — ABNORMAL LOW (ref 12.0–15.0)
MCH: 27 pg (ref 26.0–34.0)
MCV: 81 fL (ref 78.0–100.0)
Platelets: 322 10*3/uL (ref 150–400)
RBC: 4.37 MIL/uL (ref 3.87–5.11)
WBC: 6.5 10*3/uL (ref 4.0–10.5)

## 2012-04-15 LAB — POCT I-STAT TROPONIN I
Troponin i, poc: 0 ng/mL (ref 0.00–0.08)
Troponin i, poc: 0.03 ng/mL (ref 0.00–0.08)

## 2012-04-15 MED ORDER — NITROGLYCERIN 2 % TD OINT
1.0000 [in_us] | TOPICAL_OINTMENT | Freq: Once | TRANSDERMAL | Status: AC
  Administered 2012-04-15: 1 [in_us] via TOPICAL
  Filled 2012-04-15: qty 30

## 2012-04-15 MED ORDER — SODIUM CHLORIDE 0.9 % IV SOLN
Freq: Once | INTRAVENOUS | Status: AC
  Administered 2012-04-15: 21:00:00 via INTRAVENOUS

## 2012-04-15 MED ORDER — ASPIRIN 81 MG PO CHEW
162.0000 mg | CHEWABLE_TABLET | Freq: Once | ORAL | Status: AC
  Administered 2012-04-15: 162 mg via ORAL
  Filled 2012-04-15: qty 2

## 2012-04-15 NOTE — ED Notes (Signed)
Per Pt, she was at home at 8 pm and started having chest pain.  Mid chest .  No pain down either arm.    No other symptoms prior to chest pain.  No SOB.  Pain is decreasing now.

## 2012-04-15 NOTE — ED Provider Notes (Signed)
History     CSN: [id number]  Arrival date & time 04/15/12  2033   None     No chief complaint on file.   (Consider location/radiation/quality/duration/timing/severity/associated sxs/prior treatment) HPI Comments: Patient was sitting when she suddenly had mid sternal chest pressure took 3 nitro tablets with decrease in discomfort. Did not get SOB, diaphoretic, nauseated PREC score 3 due to recent knee injury and age No recent travel, a nonsmoker   The history is provided by the patient.    Past Medical History  Diagnosis Date  . ALLERGIC RHINITIS 09/11/2007  . BACK PAIN 11/14/2007  . EXTERNAL OTITIS 09/11/2007  . GERD 06/06/2007  . GOUT 11/14/2007  . HYPERTENSION 06/06/2007  . INTERMITTENT VERTIGO 09/11/2007  . Irritable bowel syndrome 06/06/2007  . LOW BACK PAIN 06/06/2007  . OSTEOARTHRITIS 06/06/2007  . SHOULDER PAIN, RIGHT 12/30/2008    Past Surgical History  Procedure Date  . Cesarean section     x 2  . Cholecystectomy   . Inguinal herniorrhapy   . Exploratory laparotomy   . S/p incarcerated hernia and ovary cyst jan 2011     Family History  Problem Relation Age of Onset  . Arthritis Mother     RA  . Colon polyps Father 74    History  Substance Use Topics  . Smoking status: Never Smoker   . Smokeless tobacco: Not on file  . Alcohol Use: No    OB History    Grav Para Term Preterm Abortions TAB SAB Ect Mult Living                  Review of Systems  Constitutional: Negative for fever and chills.  HENT: Negative for neck stiffness.   Eyes: Negative for visual disturbance.  Respiratory: Positive for chest tightness. Negative for cough and shortness of breath.   Cardiovascular: Positive for chest pain. Negative for leg swelling.  Gastrointestinal: Negative for nausea.  Neurological: Negative for dizziness, weakness and headaches.    Allergies  Review of patient's allergies indicates no known allergies.  Home Medications   Current Outpatient Rx    Name Route Sig Dispense Refill  . AMLODIPINE-OLMESARTAN 10-40 MG PO TABS Oral Take 1 tablet by mouth daily.    . ASPIRIN 81 MG PO TABS Oral Take 81 mg by mouth daily.      . CELECOXIB 200 MG PO CAPS Oral Take 200 mg by mouth daily.    Marland Kitchen NITROGLYCERIN 0.3 MG SL SUBL Sublingual Place 1 tablet (0.3 mg total) under the tongue every 5 (five) minutes as needed for chest pain. 90 tablet 12  . TRIAMCINOLONE ACETONIDE 0.1 % EX CREA Topical Apply 1 application topically 2 (two) times daily.      BP 130/84  Pulse 68  Temp 97.6 F (36.4 C) (Oral)  Resp 18  Ht 5' (1.524 m)  Wt 207 lb (93.895 kg)  BMI 40.43 kg/m2  SpO2 100%  Physical Exam  Constitutional: She appears well-developed and well-nourished.  HENT:  Head: Normocephalic.  Eyes: Pupils are equal, round, and reactive to light.  Neck: Normal range of motion.  Cardiovascular: Normal rate and regular rhythm.   Pulmonary/Chest: Effort normal and breath sounds normal.  Abdominal: Soft. She exhibits no distension. There is no tenderness.  Musculoskeletal: Normal range of motion. She exhibits no edema and no tenderness.  Neurological: She is alert.  Skin: Skin is warm.    ED Course  Procedures (including critical care time)   Labs Reviewed  CBC  No results found.   No diagnosis found.  ED ECG REPORT   Date: 04/15/2012  EKG Time: 9:12 PM  Rate: 63  Rhythm: normal sinus rhythm,  unchanged from previous tracings  Axis: normal  Intervals:none  ST&T Change: none  Narrative Interpretation: normal   I spoke with Dr. Particia Jasper who would like to see the patient in his office on Tuesday  Discussed this with the patient who is in agreement She remains pain free          MDM  PERC score 3  After patient administered 3 SL nitroglycerin  chest pain gone but still sore         Arman Filter, NP 04/16/12 0147

## 2012-04-15 NOTE — ED Provider Notes (Signed)
Medical screening examination/treatment/procedure(s) were conducted as a shared visit with non-physician practitioner(s) and myself.  I personally evaluated the patient during the encounter  Pt presents with chest pain.  No known history of CAD.  Was supposed to follow up in the past with Dr Sharyn Lull with a stress test but did not follow through.  Pt generally does not have pain when she exerts herself.  Will check labs. Overall low risk.  If negative, will consider consultation with her cardiologist for close outpt follow up.  Celene Kras, MD 04/15/12 2252

## 2012-04-15 NOTE — ED Notes (Addendum)
Pt ambulated 45 ft.  She ambulated 20 feet without assistance.   Steady gait and no shortness of breath.

## 2012-04-16 NOTE — Discharge Instructions (Signed)
Please call Dr. Particia Jasper in the morning to set an appointment tome for Tuesday

## 2012-04-17 ENCOUNTER — Other Ambulatory Visit (HOSPITAL_COMMUNITY): Payer: Self-pay | Admitting: Cardiology

## 2012-04-17 DIAGNOSIS — R079 Chest pain, unspecified: Secondary | ICD-10-CM

## 2012-04-27 ENCOUNTER — Encounter (HOSPITAL_COMMUNITY)
Admission: RE | Admit: 2012-04-27 | Discharge: 2012-04-27 | Disposition: A | Discharge status: Home / Self Care | Payer: 59 | Source: Ambulatory Visit | Attending: Cardiology | Admitting: Cardiology

## 2012-04-27 ENCOUNTER — Ambulatory Visit (HOSPITAL_COMMUNITY)
Admission: RE | Admit: 2012-04-27 | Discharge: 2012-04-27 | Disposition: A | Discharge status: Home / Self Care | Payer: 59 | Source: Ambulatory Visit | Attending: Cardiology | Admitting: Cardiology

## 2012-04-27 ENCOUNTER — Other Ambulatory Visit: Payer: Self-pay

## 2012-04-27 DIAGNOSIS — I10 Essential (primary) hypertension: Secondary | ICD-10-CM | POA: Insufficient documentation

## 2012-04-27 DIAGNOSIS — R079 Chest pain, unspecified: Secondary | ICD-10-CM

## 2012-04-27 MED ORDER — TECHNETIUM TC 99M TETROFOSMIN IV KIT
10.0000 | PACK | Freq: Once | INTRAVENOUS | Status: AC | PRN
  Administered 2012-04-27: 10 via INTRAVENOUS

## 2012-04-27 MED ORDER — TECHNETIUM TC 99M TETROFOSMIN IV KIT
30.0000 | PACK | Freq: Once | INTRAVENOUS | Status: AC | PRN
  Administered 2012-04-27: 30 via INTRAVENOUS

## 2012-04-27 MED ORDER — REGADENOSON 0.4 MG/5ML IV SOLN
INTRAVENOUS | Status: AC | PRN
  Administered 2012-04-27: 0.4 mg via INTRAVENOUS

## 2012-04-27 MED ORDER — REGADENOSON 0.4 MG/5ML IV SOLN
INTRAVENOUS | Status: AC
  Filled 2012-04-27: qty 5

## 2012-04-27 NOTE — ED Notes (Signed)
MD at bedside. 

## 2012-04-27 NOTE — ED Notes (Signed)
Denies shortness of breath or discomfort

## 2012-04-27 NOTE — ED Notes (Signed)
Stress portion of exam completed.  No adverse effects noted.

## 2012-04-27 NOTE — ED Notes (Signed)
Dr. Sharyn Lull at bedside for start and throughout exam.

## 2012-04-27 NOTE — ED Notes (Signed)
Start of test

## 2012-04-27 NOTE — ED Notes (Signed)
Continues to deny shortness of breath or discomfort at this time.

## 2012-05-20 ENCOUNTER — Emergency Department (HOSPITAL_COMMUNITY)
Admission: EM | Admit: 2012-05-20 | Discharge: 2012-05-20 | Disposition: A | Discharge status: Home / Self Care | Payer: No Typology Code available for payment source | Attending: Emergency Medicine | Admitting: Emergency Medicine

## 2012-05-20 ENCOUNTER — Encounter (HOSPITAL_COMMUNITY): Payer: Self-pay | Admitting: Emergency Medicine

## 2012-05-20 ENCOUNTER — Emergency Department (HOSPITAL_COMMUNITY): Payer: No Typology Code available for payment source

## 2012-05-20 DIAGNOSIS — T07XXXA Unspecified multiple injuries, initial encounter: Secondary | ICD-10-CM | POA: Insufficient documentation

## 2012-05-20 DIAGNOSIS — R079 Chest pain, unspecified: Secondary | ICD-10-CM | POA: Insufficient documentation

## 2012-05-20 DIAGNOSIS — I10 Essential (primary) hypertension: Secondary | ICD-10-CM | POA: Insufficient documentation

## 2012-05-20 DIAGNOSIS — M109 Gout, unspecified: Secondary | ICD-10-CM | POA: Insufficient documentation

## 2012-05-20 DIAGNOSIS — Z7982 Long term (current) use of aspirin: Secondary | ICD-10-CM | POA: Insufficient documentation

## 2012-05-20 DIAGNOSIS — M1990 Unspecified osteoarthritis, unspecified site: Secondary | ICD-10-CM | POA: Insufficient documentation

## 2012-05-20 DIAGNOSIS — Z83719 Family history of colon polyps, unspecified: Secondary | ICD-10-CM | POA: Insufficient documentation

## 2012-05-20 DIAGNOSIS — Z8371 Family history of colonic polyps: Secondary | ICD-10-CM | POA: Insufficient documentation

## 2012-05-20 DIAGNOSIS — Z8261 Family history of arthritis: Secondary | ICD-10-CM | POA: Insufficient documentation

## 2012-05-20 DIAGNOSIS — K219 Gastro-esophageal reflux disease without esophagitis: Secondary | ICD-10-CM | POA: Insufficient documentation

## 2012-05-20 LAB — URINALYSIS, ROUTINE W REFLEX MICROSCOPIC
Bilirubin Urine: NEGATIVE
Ketones, ur: NEGATIVE mg/dL
Leukocytes, UA: NEGATIVE
Nitrite: NEGATIVE
Protein, ur: NEGATIVE mg/dL
Urobilinogen, UA: 0.2 mg/dL (ref 0.0–1.0)
pH: 5.5 (ref 5.0–8.0)

## 2012-05-20 MED ORDER — HYDROCODONE-ACETAMINOPHEN 5-500 MG PO TABS: 1.0000 | ORAL_TABLET | ORAL | Status: DC | PRN

## 2012-05-20 MED ORDER — HYDROCODONE-ACETAMINOPHEN 5-325 MG PO TABS
2.0000 | ORAL_TABLET | Freq: Once | ORAL | Status: AC
  Administered 2012-05-20: 2 via ORAL
  Filled 2012-05-20: qty 2

## 2012-05-20 NOTE — ED Notes (Signed)
[id number]<AV> Expected date:<BR> Expected time:<BR> Means of arrival:<BR> Comments:<BR> Pt from hall in this room for exam
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
## 2012-05-22 ENCOUNTER — Ambulatory Visit (INDEPENDENT_AMBULATORY_CARE_PROVIDER_SITE_OTHER): Payer: 59 | Admitting: Internal Medicine

## 2012-05-22 ENCOUNTER — Encounter: Payer: Self-pay | Admitting: Internal Medicine

## 2012-05-22 VITALS — BP 132/82 | HR 74 | Temp 98.8°F | Ht 60.0 in | Wt 212.1 lb

## 2012-05-22 DIAGNOSIS — S46911A Strain of unspecified muscle, fascia and tendon at shoulder and upper arm level, right arm, initial encounter: Secondary | ICD-10-CM

## 2012-05-22 DIAGNOSIS — M549 Dorsalgia, unspecified: Secondary | ICD-10-CM

## 2012-05-22 DIAGNOSIS — R079 Chest pain, unspecified: Secondary | ICD-10-CM | POA: Insufficient documentation

## 2012-05-22 DIAGNOSIS — M546 Pain in thoracic spine: Secondary | ICD-10-CM

## 2012-05-22 DIAGNOSIS — M25511 Pain in right shoulder: Secondary | ICD-10-CM | POA: Insufficient documentation

## 2012-05-22 DIAGNOSIS — S96911A Strain of unspecified muscle and tendon at ankle and foot level, right foot, initial encounter: Secondary | ICD-10-CM | POA: Insufficient documentation

## 2012-05-22 DIAGNOSIS — IMO0002 Reserved for concepts with insufficient information to code with codable children: Secondary | ICD-10-CM

## 2012-05-22 DIAGNOSIS — S93609A Unspecified sprain of unspecified foot, initial encounter: Secondary | ICD-10-CM

## 2012-05-22 MED ORDER — NAPROXEN 500 MG PO TABS: 500.0000 mg | ORAL_TABLET | Freq: Two times a day (BID) | ORAL | Status: DC

## 2012-05-22 MED ORDER — HYDROCODONE-ACETAMINOPHEN 5-500 MG PO TABS: 1.0000 | ORAL_TABLET | Freq: Four times a day (QID) | ORAL | Status: AC | PRN

## 2012-05-22 MED ORDER — CYCLOBENZAPRINE HCL 5 MG PO TABS: 5.0000 mg | ORAL_TABLET | Freq: Three times a day (TID) | ORAL | Status: AC | PRN

## 2012-05-22 NOTE — Patient Instructions (Addendum)
Take all new medications as prescribed Continue all other medications as before You are given the work note as well
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
## 2012-05-26 ENCOUNTER — Encounter: Payer: Self-pay | Admitting: Internal Medicine
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
## 2012-05-26 NOTE — Assessment & Plan Note (Signed)
Mild to mod, doubt fx, ok to hold film at this time, cont pain control,  to f/u any worsening symptoms or concerns

## 2012-05-26 NOTE — Assessment & Plan Note (Signed)
C/w msk, recent cxr neg, Continue all other medications as before
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
## 2012-06-15 ENCOUNTER — Other Ambulatory Visit (HOSPITAL_COMMUNITY): Payer: Self-pay | Admitting: Chiropractic Medicine

## 2012-06-15 DIAGNOSIS — R609 Edema, unspecified: Secondary | ICD-10-CM

## 2012-06-15 DIAGNOSIS — R52 Pain, unspecified: Secondary | ICD-10-CM

## 2012-06-20 ENCOUNTER — Ambulatory Visit (HOSPITAL_COMMUNITY)
Admission: RE | Admit: 2012-06-20 | Discharge: 2012-06-20 | Disposition: A | Discharge status: Home / Self Care | Payer: No Typology Code available for payment source | Source: Ambulatory Visit | Attending: Chiropractic Medicine | Admitting: Chiropractic Medicine

## 2012-06-20 DIAGNOSIS — S9030XA Contusion of unspecified foot, initial encounter: Secondary | ICD-10-CM | POA: Insufficient documentation

## 2012-06-20 DIAGNOSIS — R609 Edema, unspecified: Secondary | ICD-10-CM

## 2012-06-20 DIAGNOSIS — M7660 Achilles tendinitis, unspecified leg: Secondary | ICD-10-CM | POA: Insufficient documentation

## 2012-06-20 DIAGNOSIS — M25579 Pain in unspecified ankle and joints of unspecified foot: Secondary | ICD-10-CM | POA: Insufficient documentation

## 2012-06-20 DIAGNOSIS — R52 Pain, unspecified: Secondary | ICD-10-CM

## 2012-06-27 ENCOUNTER — Other Ambulatory Visit: Payer: Self-pay | Admitting: Internal Medicine

## 2012-07-10 ENCOUNTER — Encounter: Payer: Self-pay | Admitting: Internal Medicine

## 2012-07-10 ENCOUNTER — Ambulatory Visit (INDEPENDENT_AMBULATORY_CARE_PROVIDER_SITE_OTHER): Payer: 59 | Admitting: Internal Medicine

## 2012-07-10 VITALS — BP 108/80 | HR 79 | Temp 98.5°F | Ht 60.0 in | Wt 217.2 lb

## 2012-07-10 DIAGNOSIS — M79672 Pain in left foot: Secondary | ICD-10-CM

## 2012-07-10 DIAGNOSIS — M7701 Medial epicondylitis, right elbow: Secondary | ICD-10-CM | POA: Insufficient documentation

## 2012-07-10 DIAGNOSIS — M7700 Medial epicondylitis, unspecified elbow: Secondary | ICD-10-CM

## 2012-07-10 DIAGNOSIS — M79671 Pain in right foot: Secondary | ICD-10-CM | POA: Insufficient documentation

## 2012-07-10 DIAGNOSIS — M25561 Pain in right knee: Secondary | ICD-10-CM

## 2012-07-10 DIAGNOSIS — M7702 Medial epicondylitis, left elbow: Secondary | ICD-10-CM | POA: Insufficient documentation

## 2012-07-10 DIAGNOSIS — M25562 Pain in left knee: Secondary | ICD-10-CM | POA: Insufficient documentation

## 2012-07-10 DIAGNOSIS — M79609 Pain in unspecified limb: Secondary | ICD-10-CM

## 2012-07-10 DIAGNOSIS — M25569 Pain in unspecified knee: Secondary | ICD-10-CM

## 2012-07-10 MED ORDER — HYDROCODONE-ACETAMINOPHEN 5-325 MG PO TABS: 1.0000 | ORAL_TABLET | Freq: Four times a day (QID) | ORAL | Status: DC | PRN

## 2012-07-10 MED ORDER — PREDNISONE 10 MG PO TABS: 10.0000 mg | ORAL_TABLET | Freq: Every day | ORAL | Status: DC

## 2012-07-10 NOTE — Assessment & Plan Note (Signed)
Mild tender/sympt without sweling, for pain control, avoid overuse,  to f/u any worsening symptoms or concerns

## 2012-07-10 NOTE — Assessment & Plan Note (Signed)
Mild worsening with mild effusion, has had MRI, needs surgury per pt- will refer back to [facility], for pain control

## 2012-07-10 NOTE — Assessment & Plan Note (Signed)
Mild to mod, only sympt on standing, walking but suspect mild cartilage tear, also for ortho eval as above, consider MRI but hold for the time being due to cost per pt

## 2012-07-10 NOTE — Assessment & Plan Note (Addendum)
Has ongoing persistent rather severe plantar fasciitis not improved it seems with podiatry;  Will refer to Dr Hewitt/ortho; d/w pt even if she has to be casted and given off work time hopefully this can be improved where she will not have to apply for disability

## 2012-07-10 NOTE — Patient Instructions (Addendum)
Take all new medications as prescribed Continue all other medications as before You will be contacted regarding the referral for: [facility] for the knees You will be contacted regarding the referral for: Dr Victorino Dike for the feet

## 2012-07-10 NOTE — Progress Notes (Signed)
Subjective:    Patient ID: [name], female    DOB: 1955-11-22, 56 y.o.   MRN: [id number]  HPI  Here with MSK complaints, has ongoing bilat plantar fasciitis apparently resistant to tx with seeing podiatry, has to stand constantly at work,  Seriously considering applying for disability for this as pain is ongoing, severe,  Unable to lose wt.  Tx to date not helping.  Also unfortunately with pain to both inner elbows midl to mod, worse for 2 wks, left elbow with swelling, worse to flex elbow or pick up objects.  Has known severe right knee cartilage/meniscal tear and rec'd for surgury per ortho ([facility]) but has been deferring.  Now with new 2 wk mod to severe sharp pain to the left knee anterolat aspect without swelling but worse with standing/walking, better with sitting.  No giveaways or falls or trauma.  Has small click intermittent to the area.   Past Medical History  Diagnosis Date  . ALLERGIC RHINITIS 09/11/2007  . BACK PAIN 11/14/2007  . EXTERNAL OTITIS 09/11/2007  . GERD 06/06/2007  . GOUT 11/14/2007  . HYPERTENSION 06/06/2007  . INTERMITTENT VERTIGO 09/11/2007  . Irritable bowel syndrome 06/06/2007  . LOW BACK PAIN 06/06/2007  . OSTEOARTHRITIS 06/06/2007  . SHOULDER PAIN, RIGHT 12/30/2008  . Hernia    Past Surgical History  Procedure Date  . Cesarean section     x 2  . Cholecystectomy   . Inguinal herniorrhapy   . Exploratory laparotomy   . S/p incarcerated hernia and ovary cyst jan 2011     reports that she has never smoked. She does not have any smokeless tobacco history on file. She reports that she does not drink alcohol. Her drug history not on file. family history includes Arthritis in her mother and Colon polyps (age of onset:68) in her father. No Known Allergies Current Outpatient Prescriptions on File Prior to Visit  Medication Sig Dispense Refill  . amLODipine-olmesartan (AZOR) 10-40 MG per tablet Take 1 tablet by mouth daily.      . AZOR 10-40 MG per  tablet TAKE ONE TABLET BY MOUTH EVERY DAY  90 each  1  . naproxen (NAPROSYN) 500 MG tablet Take 1 tablet (500 mg total) by mouth 2 (two) times daily with a meal.  60 tablet  2  . triamcinolone cream (KENALOG) 0.1 % Apply 1 application topically 2 (two) times daily.      Marland Kitchen aspirin 81 MG tablet Take 81 mg by mouth daily.        . nitroGLYCERIN (NITROSTAT) 0.3 MG SL tablet Place 1 tablet (0.3 mg total) under the tongue every 5 (five) minutes as needed for chest pain.  90 tablet  12   Review of Systems  Constitutional: Negative for diaphoresis and unexpected weight change.  HENT: Negative for tinnitus.   Eyes: Negative for photophobia and visual disturbance.  Respiratory: Negative for choking and stridor.   Gastrointestinal: Negative for vomiting and blood in stool.  Genitourinary: Negative for hematuria and decreased urine volume.  Musculoskeletal: Negative for other acute joint sweling Skin: Negative for color change and wound.  Neurological: Negative for tremors and numbness.  Psychiatric/Behavioral: Negative for decreased concentration. The patient is not hyperactive.       Objective:   Physical Exam BP 108/80  Pulse 79  Temp 98.5 F (36.9 C) (Oral)  Ht 5' (1.524 m)  Wt 217 lb 4 oz (98.544 kg)  BMI 42.43 kg/m2  SpO2 99% Physical Exam  VS  noted Constitutional: Pt appears well-developed and well-nourished.  HENT: Head: Normocephalic.  Right Ear: External ear normal.  Left Ear: External ear normal.  Eyes: Conjunctivae and EOM are normal. Pupils are equal, round, and reactive to light.  Neck: Normal range of motion. Neck supple.  Cardiovascular: Normal rate and regular rhythm.   Pulmonary/Chest: Effort normal and breath sounds normal.  Abd:  Soft, NT, non-distended, + BS Neurological: Pt is alert. Not confused , motor/gait intact Left elbow with mod tender and localized sweling to the medial epicondylar area;  Right elbow with tenderness same area without swelling - o/w elbows  FROM,no effusion Right knee with 1+ effusion, decrassed ROM, NT Left knee with FROM, no effusion, but tender over anterlat aspect below the joint line with click on ROM and discomfort Skin: Skin is warm. No erythema.  Psychiatric: Pt behavior is normal. Thought content normal. 1+ nervous, + dysphoric    Assessment & Plan:

## 2012-07-10 NOTE — Assessment & Plan Note (Signed)
Moderate with sweling, for predpack, pain control, avoid overuse,  to f/u any worsening symptoms or concerns

## 2012-07-13 ENCOUNTER — Other Ambulatory Visit (HOSPITAL_COMMUNITY): Payer: Self-pay | Admitting: Obstetrics and Gynecology

## 2012-07-13 DIAGNOSIS — R109 Unspecified abdominal pain: Secondary | ICD-10-CM

## 2012-07-17 ENCOUNTER — Ambulatory Visit (HOSPITAL_COMMUNITY)
Admission: RE | Admit: 2012-07-17 | Discharge: 2012-07-17 | Disposition: A | Discharge status: Home / Self Care | Payer: 59 | Source: Ambulatory Visit | Attending: Obstetrics and Gynecology | Admitting: Obstetrics and Gynecology

## 2012-07-17 ENCOUNTER — Other Ambulatory Visit (HOSPITAL_COMMUNITY): Payer: Self-pay | Admitting: Obstetrics and Gynecology

## 2012-07-17 DIAGNOSIS — R109 Unspecified abdominal pain: Secondary | ICD-10-CM

## 2012-07-17 DIAGNOSIS — R1032 Left lower quadrant pain: Secondary | ICD-10-CM | POA: Insufficient documentation

## 2012-07-17 MED ORDER — IOHEXOL 300 MG/ML  SOLN
100.0000 mL | Freq: Once | INTRAMUSCULAR | Status: AC | PRN
  Administered 2012-07-17: 100 mL via INTRAVENOUS

## 2012-08-24 ENCOUNTER — Ambulatory Visit: Payer: 59 | Admitting: Internal Medicine

## 2012-08-24 DIAGNOSIS — Z0289 Encounter for other administrative examinations: Secondary | ICD-10-CM

## 2012-08-31 ENCOUNTER — Encounter: Payer: Self-pay | Admitting: Internal Medicine

## 2012-08-31 ENCOUNTER — Ambulatory Visit (INDEPENDENT_AMBULATORY_CARE_PROVIDER_SITE_OTHER): Payer: 59 | Admitting: Internal Medicine

## 2012-08-31 ENCOUNTER — Other Ambulatory Visit (INDEPENDENT_AMBULATORY_CARE_PROVIDER_SITE_OTHER): Payer: 59

## 2012-08-31 VITALS — BP 108/80 | HR 95 | Temp 98.5°F | Ht 60.0 in | Wt 218.5 lb

## 2012-08-31 DIAGNOSIS — M79671 Pain in right foot: Secondary | ICD-10-CM | POA: Insufficient documentation

## 2012-08-31 DIAGNOSIS — D649 Anemia, unspecified: Secondary | ICD-10-CM

## 2012-08-31 DIAGNOSIS — R7302 Impaired glucose tolerance (oral): Secondary | ICD-10-CM

## 2012-08-31 DIAGNOSIS — R7309 Other abnormal glucose: Secondary | ICD-10-CM

## 2012-08-31 DIAGNOSIS — M79672 Pain in left foot: Secondary | ICD-10-CM

## 2012-08-31 DIAGNOSIS — M79609 Pain in unspecified limb: Secondary | ICD-10-CM

## 2012-08-31 DIAGNOSIS — Z0000 Encounter for general adult medical examination without abnormal findings: Secondary | ICD-10-CM

## 2012-08-31 HISTORY — DX: Anemia, unspecified: D64.9

## 2012-08-31 LAB — BASIC METABOLIC PANEL
BUN: 10 mg/dL (ref 6–23)
CO2: 24 mEq/L (ref 19–32)
Chloride: 105 mEq/L (ref 96–112)
Creatinine, Ser: 0.5 mg/dL (ref 0.4–1.2)
Glucose, Bld: 98 mg/dL (ref 70–99)

## 2012-08-31 LAB — CBC WITH DIFFERENTIAL/PLATELET
Basophils Relative: 0.8 % (ref 0.0–3.0)
Eosinophils Absolute: 0.2 10*3/uL (ref 0.0–0.7)
Lymphocytes Relative: 42.8 % (ref 12.0–46.0)
MCHC: 32.4 g/dL (ref 30.0–36.0)
Neutrophils Relative %: 47.2 % (ref 43.0–77.0)
RBC: 4.53 Mil/uL (ref 3.87–5.11)
WBC: 6.1 10*3/uL (ref 4.5–10.5)

## 2012-08-31 LAB — HEPATIC FUNCTION PANEL
ALT: 16 U/L (ref 0–35)
Albumin: 4.4 g/dL (ref 3.5–5.2)
Total Protein: 8.6 g/dL — ABNORMAL HIGH (ref 6.0–8.3)

## 2012-08-31 LAB — URINALYSIS, ROUTINE W REFLEX MICROSCOPIC
Hgb urine dipstick: NEGATIVE
Nitrite: NEGATIVE
Total Protein, Urine: NEGATIVE
Urobilinogen, UA: 0.2 (ref 0.0–1.0)

## 2012-08-31 LAB — VITAMIN B12: Vitamin B-12: 564 pg/mL (ref 211–911)

## 2012-08-31 LAB — HEMOGLOBIN A1C: Hgb A1c MFr Bld: 6.5 % (ref 4.6–6.5)

## 2012-08-31 MED ORDER — AMLODIPINE-OLMESARTAN 10-40 MG PO TABS: 1.0000 | ORAL_TABLET | Freq: Every day | ORAL | Status: DC

## 2012-08-31 MED ORDER — TRAMADOL HCL 50 MG PO TABS: 50.0000 mg | ORAL_TABLET | Freq: Four times a day (QID) | ORAL | Status: DC | PRN

## 2012-08-31 MED ORDER — NAPROXEN 500 MG PO TABS: 500.0000 mg | ORAL_TABLET | Freq: Two times a day (BID) | ORAL | Status: AC

## 2012-08-31 NOTE — Patient Instructions (Addendum)
OK to stop the hydrocodone Take all new medications as prescribed - the tramadol for pain Continue all other medications as before All of your medications were refilled, and you are given the hardcopy for the Azor to use at the [facility] pharmacy as well (and the discount card) Please have the pharmacy call with any other refills you may need. Please go to LAB in the Basement for the blood and/or urine tests to be done today You will be contacted by phone if any changes need to be made immediately.  Otherwise, you will receive a letter about your results with an explanation, but please check with MyChart first. Thank you for enrolling in MyChart. Please follow the instructions below to securely access your online medical record. MyChart allows you to send messages to your doctor, view your test results, renew your prescriptions, schedule appointments, and more. To Log into MyChart, please go to [email or web address], and your Username is: [email or web address] Please remember to followup with your GYN for the yearly pap smear and/or mammogram Please call if you change your mind about the colonoscopy Please return if you change your mind about the flu shot You will be contacted regarding the referral for: Dr Victorino Dike Please return in 6 months, or sooner if needed

## 2012-09-01 ENCOUNTER — Encounter: Payer: Self-pay | Admitting: Internal Medicine

## 2012-09-01 NOTE — Assessment & Plan Note (Signed)
Overall doing well, age appropriate education and counseling updated, referrals for preventative services and immunizations addressed, dietary and smoking counseling addressed, most recent labs and ECG reviewed.  I have personally reviewed and have noted: 1) the patient's medical and social history 2) The pt's use of alcohol, tobacco, and illicit drugs 3) The patient's current medications and supplements 4) Functional ability including ADL's, fall risk, home safety risk, hearing and visual impairment 5) Diet and physical activities 6) Evidence for depression or mood disorder 7) The patient's height, weight, and BMI have been recorded in the chart I have made referrals, and provided counseling and education based on review of the above Declines colonscopy and flu shot

## 2012-09-01 NOTE — Progress Notes (Signed)
Subjective:    Patient ID: Bianca Campbell, female    DOB: 11-23-1955, 56 y.o.   MRN: [id number]  HPI  Here for wellness and f/u;  Overall doing ok;  Pt denies CP, worsening SOB, DOE, wheezing, orthopnea, PND, worsening LE edema, palpitations, dizziness or syncope.  Pt denies neurological change such as new Headache, facial or extremity weakness.  Pt denies polydipsia, polyuria, or low sugar symptoms. Pt states overall good compliance with treatment and medications, good tolerability, and trying to follow lower cholesterol diet.  Pt denies worsening depressive symptoms, suicidal ideation or panic. No fever, wt loss, night sweats, loss of appetite, or other constitutional symptoms.  Pt states good ability with ADL's, low fall risk, home safety reviewed and adequate, no significant changes in hearing or vision, and not active with exercise, as her ongoing chronic bilat feet pain persists despite tx per 2 podaitrists.   Past Medical History  Diagnosis Date  . ALLERGIC RHINITIS 09/11/2007  . BACK PAIN 11/14/2007  . EXTERNAL OTITIS 09/11/2007  . GERD 06/06/2007  . GOUT 11/14/2007  . HYPERTENSION 06/06/2007  . INTERMITTENT VERTIGO 09/11/2007  . Irritable bowel syndrome 06/06/2007  . LOW BACK PAIN 06/06/2007  . OSTEOARTHRITIS 06/06/2007  . SHOULDER PAIN, RIGHT 12/30/2008  . Hernia   . Anemia, unspecified 08/31/2012   Past Surgical History  Procedure Date  . Cesarean section     x 2  . Cholecystectomy   . Inguinal herniorrhapy   . Exploratory laparotomy   . S/p incarcerated hernia and ovary cyst jan 2011     reports that she has never smoked. She does not have any smokeless tobacco history on file. She reports that she does not drink alcohol. Her drug history not on file. family history includes Arthritis in her mother and Colon polyps (age of onset:68) in her father. No Known Allergies Current Outpatient Prescriptions on File Prior to Visit  Medication Sig Dispense Refill  .  amLODipine-olmesartan (AZOR) 10-40 MG per tablet Take 1 tablet by mouth daily.  90 tablet  3  . aspirin 81 MG tablet Take 81 mg by mouth daily.        . nitroGLYCERIN (NITROSTAT) 0.3 MG SL tablet Place 1 tablet (0.3 mg total) under the tongue every 5 (five) minutes as needed for chest pain.  90 tablet  12   Review of Systems Review of Systems  Constitutional: Negative for diaphoresis, activity change, appetite change and unexpected weight change.  HENT: Negative for hearing loss, ear pain, facial swelling, mouth sores and neck stiffness.   Eyes: Negative for pain, redness and visual disturbance.  Respiratory: Negative for shortness of breath and wheezing.   Cardiovascular: Negative for chest pain and palpitations.  Gastrointestinal: Negative for diarrhea, blood in stool, abdominal distention and rectal pain.  Genitourinary: Negative for hematuria, flank pain and decreased urine volume.  Musculoskeletal: Negative for myalgias and joint swelling.  Skin: Negative for color change and wound.  Neurological: Negative for syncope and numbness.  Hematological: Negative for adenopathy.  Psychiatric/Behavioral: Negative for hallucinations, self-injury, decreased concentration and agitation.      Objective:   Physical Exam BP 108/80  Pulse 95  Temp 98.5 F (36.9 C) (Oral)  Ht 5' (1.524 m)  Wt 218 lb 8 oz (99.111 kg)  BMI 42.67 kg/m2  SpO2 96% Physical Exam  VS noted Constitutional: Pt is oriented to person, place, and time. Appears well-developed and well-nourished.  HENT:  Head: Normocephalic and atraumatic.  Right Ear: External ear normal.  Left Ear: External ear normal.  Nose: Nose normal.  Mouth/Throat: Oropharynx is clear and moist.  Eyes: Conjunctivae and EOM are normal. Pupils are equal, round, and reactive to light.  Neck: Normal range of motion. Neck supple. No JVD present. No tracheal deviation present.  Cardiovascular: Normal rate, regular rhythm, normal heart sounds and  intact distal pulses.   Pulmonary/Chest: Effort normal and breath sounds normal.  Abdominal: Soft. Bowel sounds are normal. There is no tenderness.  Musculoskeletal: Normal range of motion. Exhibits no edema.  Lymphadenopathy:  Has no cervical adenopathy.  Neurological: Pt is alert and oriented to person, place, and time. Pt has normal reflexes. No cranial nerve deficit.  Skin: Skin is warm and dry. No rash noted.  Psychiatric:  Has  normal mood and affect. Behavior is normal.     Assessment & Plan:

## 2012-09-01 NOTE — Assessment & Plan Note (Signed)
Also for iron/b12 level

## 2012-09-01 NOTE — Assessment & Plan Note (Addendum)
stable overall by hx and exam, most recent data reviewed with pt, and pt to continue medical treatment as before  Lab Results  Component Value Date   HGBA1C 6.3 02/23/2012   For a1c next visit

## 2012-09-01 NOTE — Assessment & Plan Note (Signed)
To try change hydrocodone to tramadol

## 2012-09-01 NOTE — Assessment & Plan Note (Signed)
Ok for ortho referral, to dr [employer]

## 2012-09-03 LAB — LIPID PANEL: Cholesterol: 265 mg/dL — ABNORMAL HIGH (ref 0–200)

## 2012-09-03 LAB — LDL CHOLESTEROL, DIRECT: Direct LDL: 133.9 mg/dL

## 2012-12-01 ENCOUNTER — Other Ambulatory Visit: Payer: Self-pay

## 2013-02-28 ENCOUNTER — Ambulatory Visit (INDEPENDENT_AMBULATORY_CARE_PROVIDER_SITE_OTHER): Payer: 59 | Admitting: Internal Medicine

## 2013-02-28 DIAGNOSIS — I10 Essential (primary) hypertension: Secondary | ICD-10-CM

## 2013-02-28 DIAGNOSIS — Z0289 Encounter for other administrative examinations: Secondary | ICD-10-CM

## 2013-02-28 NOTE — Patient Instructions (Addendum)
Pt not seen.

## 2013-02-28 NOTE — Progress Notes (Signed)
  Subjective:    Patient ID: Bianca Campbell, female    DOB: 03-22-1956, 57 y.o.   MRN: [id number]  HPI  Note opened in error, pt no show for appt    Review of Systems     Objective:   Physical Exam        Assessment & Plan:

## 2013-03-13 ENCOUNTER — Ambulatory Visit (INDEPENDENT_AMBULATORY_CARE_PROVIDER_SITE_OTHER): Payer: 59 | Admitting: Internal Medicine

## 2013-03-13 ENCOUNTER — Encounter: Payer: Self-pay | Admitting: Internal Medicine

## 2013-03-13 ENCOUNTER — Ambulatory Visit (INDEPENDENT_AMBULATORY_CARE_PROVIDER_SITE_OTHER): Payer: 59

## 2013-03-13 VITALS — BP 120/82 | HR 77 | Temp 98.1°F | Ht 60.0 in | Wt 220.5 lb

## 2013-03-13 DIAGNOSIS — I10 Essential (primary) hypertension: Secondary | ICD-10-CM

## 2013-03-13 DIAGNOSIS — R7309 Other abnormal glucose: Secondary | ICD-10-CM

## 2013-03-13 DIAGNOSIS — E559 Vitamin D deficiency, unspecified: Secondary | ICD-10-CM

## 2013-03-13 DIAGNOSIS — Z0000 Encounter for general adult medical examination without abnormal findings: Secondary | ICD-10-CM

## 2013-03-13 DIAGNOSIS — R739 Hyperglycemia, unspecified: Secondary | ICD-10-CM

## 2013-03-13 DIAGNOSIS — R7302 Impaired glucose tolerance (oral): Secondary | ICD-10-CM

## 2013-03-13 DIAGNOSIS — E785 Hyperlipidemia, unspecified: Secondary | ICD-10-CM | POA: Insufficient documentation

## 2013-03-13 HISTORY — DX: Vitamin D deficiency, unspecified: E55.9

## 2013-03-13 LAB — HEMOGLOBIN A1C: Hgb A1c MFr Bld: 6.4 % (ref 4.6–6.5)

## 2013-03-13 LAB — LIPID PANEL
Cholesterol: 203 mg/dL — ABNORMAL HIGH (ref 0–200)
Total CHOL/HDL Ratio: 3
Triglycerides: 58 mg/dL (ref 0.0–149.0)
VLDL: 11.6 mg/dL (ref 0.0–40.0)

## 2013-03-13 LAB — BASIC METABOLIC PANEL
Chloride: 102 mEq/L (ref 96–112)
Potassium: 4.1 mEq/L (ref 3.5–5.1)
Sodium: 136 mEq/L (ref 135–145)

## 2013-03-13 NOTE — Patient Instructions (Addendum)
Please continue all other medications as before, and refills have been done if requested. Please have the pharmacy call with any other refills you may need. Please go to the LAB in the Basement (turn left off the elevator) for the tests to be done today You will be contacted by phone if any changes need to be made immediately.  Otherwise, you will receive a letter about your results with an explanation, but please check with MyChart first. You will be contacted regarding the referral for: colonoscopy Please continue your efforts at being more active, low cholesterol diet, and weight control.  Thank you for enrolling in MyChart. Please follow the instructions below to securely access your online medical record. MyChart allows you to send messages to your doctor, view your test results, renew your prescriptions, schedule appointments, and more.  Please return in 6 months, or sooner if needed, with Lab testing done 3-5 days before

## 2013-03-13 NOTE — Assessment & Plan Note (Signed)
stable overall by history and exam, recent data reviewed with pt, and pt to continue medical treatment as before,  to f/u any worsening symptoms or concerns Lab Results  Component Value Date   HGBA1C 6.5 08/31/2012

## 2013-03-13 NOTE — Progress Notes (Signed)
Subjective:    Patient ID: Bianca Campbell, female    DOB: Nov 17, 1955, 57 y.o.   MRN: [id number]  HPI  Here to f/u; overall doing ok,  Pt denies chest pain, increased sob or doe, wheezing, orthopnea, PND, increased LE swelling, palpitations, dizziness or syncope.  Pt denies polydipsia, polyuria, or low sugar symptoms such as weakness or confusion improved with po intake.  Pt denies new neurological symptoms such as new headache, or facial or extremity weakness or numbness.   Pt states overall good compliance with meds, has been trying to follow lower cholesterol diet, with wt overall stable,  but little exercise however. Hard to lose wt. Asks for vit d f/u lab with hx of deficiency.  Saw Dr Farris Has, now on reelafen for right knee pain, now better Past Medical History  Diagnosis Date  . ALLERGIC RHINITIS 09/11/2007  . BACK PAIN 11/14/2007  . EXTERNAL OTITIS 09/11/2007  . GERD 06/06/2007  . GOUT 11/14/2007  . HYPERTENSION 06/06/2007  . INTERMITTENT VERTIGO 09/11/2007  . Irritable bowel syndrome 06/06/2007  . LOW BACK PAIN 06/06/2007  . OSTEOARTHRITIS 06/06/2007  . SHOULDER PAIN, RIGHT 12/30/2008  . Hernia   . Anemia, unspecified 08/31/2012   Past Surgical History  Procedure Laterality Date  . Cesarean section      x 2  . Cholecystectomy    . Inguinal herniorrhapy    . Exploratory laparotomy    . S/p incarcerated hernia and ovary cyst jan 2011      reports that she has never smoked. She does not have any smokeless tobacco history on file. She reports that she does not drink alcohol. Her drug history is not on file. family history includes Arthritis in her mother and Colon polyps (age of onset: 5) in her father. No Known Allergies Current Outpatient Prescriptions on File Prior to Visit  Medication Sig Dispense Refill  . amLODipine-olmesartan (AZOR) 10-40 MG per tablet Take 1 tablet by mouth daily.  90 tablet  3  . aspirin 81 MG tablet Take 81 mg by mouth daily.        . naproxen (NAPROSYN)  500 MG tablet Take 1 tablet (500 mg total) by mouth 2 (two) times daily with a meal.  180 tablet  1  . nitroGLYCERIN (NITROSTAT) 0.3 MG SL tablet Place 1 tablet (0.3 mg total) under the tongue every 5 (five) minutes as needed for chest pain.  90 tablet  12  . traMADol (ULTRAM) 50 MG tablet Take 1 tablet (50 mg total) by mouth every 6 (six) hours as needed for pain.  120 tablet  2   No current facility-administered medications on file prior to visit.   Review of Systems  Constitutional: Negative for unexpected weight change, or unusual diaphoresis  HENT: Negative for tinnitus.   Eyes: Negative for photophobia and visual disturbance.  Respiratory: Negative for choking and stridor.   Gastrointestinal: Negative for vomiting and blood in stool.  Genitourinary: Negative for hematuria and decreased urine volume.  Musculoskeletal: Negative for acute joint swelling Skin: Negative for color change and wound.  Neurological: Negative for tremors and numbness other than noted  Psychiatric/Behavioral: Negative for decreased concentration or  hyperactivity.       Objective:   Physical Exam BP 120/82  Pulse 77  Temp(Src) 98.1 F (36.7 C) (Oral)  Ht 5' (1.524 m)  Wt 220 lb 8 oz (100.018 kg)  BMI 43.06 kg/m2  SpO2 98% VS noted,  Constitutional: Pt appears well-developed and well-nourished.  HENT: Head:  NCAT.  Right Ear: External ear normal.  Left Ear: External ear normal.  Eyes: Conjunctivae and EOM are normal. Pupils are equal, round, and reactive to light.  Neck: Normal range of motion. Neck supple.  Cardiovascular: Normal rate and regular rhythm.   Pulmonary/Chest: Effort normal and breath sounds normal.  Abd:  Soft, NT, non-distended, + BS Neurological: Pt is alert. Not confused  Skin: Skin is warm. No erythema.  Psychiatric: Pt behavior is normal. Thought content normal.     Assessment & Plan:

## 2013-03-13 NOTE — Assessment & Plan Note (Signed)
For f/u level today 

## 2013-03-13 NOTE — Assessment & Plan Note (Signed)
stable overall by history and exam, recent data reviewed with pt, and pt to continue medical treatment as before,  to f/u any worsening symptoms or concerns BP Readings from Last 3 Encounters:  03/13/13 120/82  08/31/12 108/80  07/10/12 108/80

## 2013-03-13 NOTE — Assessment & Plan Note (Signed)
stable overall by history and exam, recent data reviewed with pt, and pt to continue medical treatment as before,  to f/u any worsening symptoms or concerns Lab Results  Component Value Date   LDLCALC 93 02/23/2012

## 2013-03-14 LAB — VITAMIN D 25 HYDROXY (VIT D DEFICIENCY, FRACTURES): Vit D, 25-Hydroxy: 29 ng/mL — ABNORMAL LOW (ref 30–89)

## 2013-06-04 ENCOUNTER — Encounter: Payer: Self-pay | Admitting: Gastroenterology

## 2013-06-12 ENCOUNTER — Telehealth: Payer: Self-pay

## 2013-06-12 NOTE — Telephone Encounter (Signed)
Patient called [facility] request azor samples. Thanks

## 2013-06-13 NOTE — Telephone Encounter (Signed)
Called the patient left detailed msg. That samples are ready for pickup at the front desk.

## 2013-08-22 ENCOUNTER — Other Ambulatory Visit: Payer: Self-pay

## 2013-09-04 ENCOUNTER — Other Ambulatory Visit: Payer: Self-pay | Admitting: Internal Medicine

## 2013-09-10 ENCOUNTER — Other Ambulatory Visit: Payer: Self-pay

## 2013-09-10 DIAGNOSIS — M79671 Pain in right foot: Secondary | ICD-10-CM

## 2013-09-10 MED ORDER — TRAMADOL HCL 50 MG PO TABS: 50.0000 mg | ORAL_TABLET | Freq: Four times a day (QID) | ORAL | Status: DC | PRN

## 2013-09-10 NOTE — Telephone Encounter (Signed)
Faxed hardcopy to [facility] [place]  

## 2013-09-10 NOTE — Telephone Encounter (Signed)
Done hardcopy to [facility]  

## 2013-09-18 ENCOUNTER — Ambulatory Visit: Payer: 59 | Admitting: Internal Medicine

## 2013-09-25 ENCOUNTER — Ambulatory Visit (INDEPENDENT_AMBULATORY_CARE_PROVIDER_SITE_OTHER): Payer: 59 | Admitting: Internal Medicine

## 2013-09-25 ENCOUNTER — Encounter: Payer: Self-pay | Admitting: Internal Medicine

## 2013-09-25 ENCOUNTER — Other Ambulatory Visit (INDEPENDENT_AMBULATORY_CARE_PROVIDER_SITE_OTHER): Payer: 59

## 2013-09-25 ENCOUNTER — Ambulatory Visit: Payer: 59 | Admitting: Internal Medicine

## 2013-09-25 VITALS — BP 130/78 | HR 88 | Temp 97.5°F | Ht 60.0 in | Wt 224.0 lb

## 2013-09-25 DIAGNOSIS — R7302 Impaired glucose tolerance (oral): Secondary | ICD-10-CM

## 2013-09-25 DIAGNOSIS — M79671 Pain in right foot: Secondary | ICD-10-CM

## 2013-09-25 DIAGNOSIS — R739 Hyperglycemia, unspecified: Secondary | ICD-10-CM

## 2013-09-25 DIAGNOSIS — Z0000 Encounter for general adult medical examination without abnormal findings: Secondary | ICD-10-CM

## 2013-09-25 DIAGNOSIS — M79609 Pain in unspecified limb: Secondary | ICD-10-CM

## 2013-09-25 DIAGNOSIS — R7309 Other abnormal glucose: Secondary | ICD-10-CM

## 2013-09-25 DIAGNOSIS — M1990 Unspecified osteoarthritis, unspecified site: Secondary | ICD-10-CM

## 2013-09-25 LAB — URINALYSIS, ROUTINE W REFLEX MICROSCOPIC
Hgb urine dipstick: NEGATIVE
Nitrite: NEGATIVE
Total Protein, Urine: NEGATIVE
Urine Glucose: NEGATIVE
Urobilinogen, UA: 0.2 (ref 0.0–1.0)
WBC, UA: NONE SEEN (ref 0–?)

## 2013-09-25 LAB — HEMOGLOBIN A1C: Hgb A1c MFr Bld: 6.2 % (ref 4.6–6.5)

## 2013-09-25 LAB — CBC WITH DIFFERENTIAL/PLATELET
Basophils Absolute: 0 10*3/uL (ref 0.0–0.1)
Eosinophils Absolute: 0.3 10*3/uL (ref 0.0–0.7)
Hemoglobin: 12.6 g/dL (ref 12.0–15.0)
Lymphocytes Relative: 43.1 % (ref 12.0–46.0)
MCHC: 33 g/dL (ref 30.0–36.0)
MCV: 80.3 fl (ref 78.0–100.0)
Monocytes Absolute: 0.5 10*3/uL (ref 0.1–1.0)
Neutro Abs: 3.4 10*3/uL (ref 1.4–7.7)
Neutrophils Relative %: 46.6 % (ref 43.0–77.0)
RBC: 4.75 Mil/uL (ref 3.87–5.11)
RDW: 13.9 % (ref 11.5–14.6)

## 2013-09-25 MED ORDER — TRAMADOL HCL 50 MG PO TABS: 50.0000 mg | ORAL_TABLET | Freq: Four times a day (QID) | ORAL | Status: DC | PRN

## 2013-09-25 NOTE — Patient Instructions (Addendum)
Please continue all other medications as before, and refills have been done if requested.  Please have the pharmacy call with any other refills you may need.  Please continue your efforts at being more active, low cholesterol diet, and weight control.  You are otherwise up to date with prevention measures today.  Please remember to followup with your GYN for the yearly pap smear and/or mammogram  You will be contacted regarding the referral for: colonoscopy   Please keep your appointments with your specialists as you may have planned  Please go to the LAB in the Basement (turn left off the elevator) for the tests to be done today  You will be contacted by phone if any changes need to be made immediately.  Otherwise, you will receive a letter about your results with an explanation, but please check with MyChart first.  Please return in 6 months, or sooner if needed

## 2013-09-25 NOTE — Assessment & Plan Note (Signed)

## 2013-09-25 NOTE — Progress Notes (Signed)
Subjective:    Patient ID: Bianca Campbell, female    DOB: [birth year], 56 y.o.   MRN: [id number]  HPI  Here for wellness and f/u;  Overall doing ok;  Pt denies CP, worsening SOB, DOE, wheezing, orthopnea, PND, worsening LE edema, palpitations, dizziness or syncope.  Pt denies neurological change such as new headache, facial or extremity weakness.  Pt denies polydipsia, polyuria, or low sugar symptoms. Pt states overall good compliance with treatment and medications, good tolerability, and has been trying to follow lower cholesterol diet.  Pt denies worsening depressive symptoms, suicidal ideation or panic. No fever, night sweats, wt loss, loss of appetite, or other constitutional symptoms.  Pt states good ability with ADL's, has low fall risk, home safety reviewed and adequate, no other significant changes in hearing or vision, and only occasionally active with exercise.  No acute complaints, needs tramadol refill for left knee pain, plans to f/u with ortho soon. Past Medical History  Diagnosis Date  . ALLERGIC RHINITIS 09/11/2007  . BACK PAIN 11/14/2007  . EXTERNAL OTITIS 09/11/2007  . GERD 06/06/2007  . GOUT 11/14/2007  . HYPERTENSION 06/06/2007  . INTERMITTENT VERTIGO 09/11/2007  . Irritable bowel syndrome 06/06/2007  . LOW BACK PAIN 06/06/2007  . OSTEOARTHRITIS 06/06/2007  . SHOULDER PAIN, RIGHT 12/30/2008  . Hernia   . Anemia, unspecified 08/31/2012  . Other and unspecified hyperlipidemia 03/13/2013   Past Surgical History  Procedure Laterality Date  . Cesarean section      x 2  . Cholecystectomy    . Inguinal herniorrhapy    . Exploratory laparotomy    . S/p incarcerated hernia and ovary cyst jan 2011      reports that she has never smoked. She does not have any smokeless tobacco history on file. She reports that she does not drink alcohol. Her drug history is not on file. family history includes Arthritis in her mother; Colon polyps (age of onset: 15) in her father. No Known  Allergies Current Outpatient Prescriptions on File Prior to Visit  Medication Sig Dispense Refill  . amLODipine-olmesartan (AZOR) 10-40 MG per tablet Take 1 tablet by mouth daily.  90 tablet  3  . aspirin 81 MG tablet Take 81 mg by mouth daily.        . AZOR 10-40 MG per tablet TAKE ONE TABLET BY MOUTH EVERY DAY  90 tablet  2  . nabumetone (RELAFEN) 500 MG tablet Take 500 mg by mouth 2 (two) times daily.      . nitroGLYCERIN (NITROSTAT) 0.3 MG SL tablet Place 1 tablet (0.3 mg total) under the tongue every 5 (five) minutes as needed for chest pain.  90 tablet  12   No current facility-administered medications on file prior to visit.   Review of Systems  Constitutional: Negative for diaphoresis, activity change, appetite change or unexpected weight change.  HENT: Negative for hearing loss, ear pain, facial swelling, mouth sores and neck stiffness.   Eyes: Negative for pain, redness and visual disturbance.  Respiratory: Negative for shortness of breath and wheezing.   Cardiovascular: Negative for chest pain and palpitations.  Gastrointestinal: Negative for diarrhea, blood in stool, abdominal distention or other pain Genitourinary: Negative for hematuria, flank pain or change in urine volume.  Musculoskeletal: Negative for myalgias and joint swelling.  Skin: Negative for color change and wound.  Neurological: Negative for syncope and numbness. other than noted Hematological: Negative for adenopathy.  Psychiatric/Behavioral: Negative for hallucinations, self-injury, decreased concentration and agitation.  Objective:   Physical Exam  BP 130/78  Pulse 88  Temp(Src) 97.5 F (36.4 C) (Oral)  Ht 5' (1.524 m)  Wt 224 lb (101.606 kg)  BMI 43.75 kg/m2  SpO2 96% VS noted,  Constitutional: Pt is oriented to person, place, and time. Appears well-developed and well-nourished.  Head: Normocephalic and atraumatic.  Right Ear: External ear normal.  Left Ear: External ear normal.  Nose: Nose  normal.  Mouth/Throat: Oropharynx is clear and moist.  Eyes: Conjunctivae and EOM are normal. Pupils are equal, round, and reactive to light.  Neck: Normal range of motion. Neck supple. No JVD present. No tracheal deviation present.  Cardiovascular: Normal rate, regular rhythm, normal heart sounds and intact distal pulses.   Pulmonary/Chest: Effort normal and breath sounds normal.  Abdominal: Soft. Bowel sounds are normal. There is no tenderness. No HSM  Musculoskeletal: Normal range of motion. Exhibits no edema.  Lymphadenopathy:  Has no cervical adenopathy.  Neurological: Pt is alert and oriented to person, place, and time. Pt has normal reflexes. No cranial nerve deficit.  Skin: Skin is warm and dry. No rash noted.  Psychiatric:  Has  normal mood and affect. Behavior is normal.  Left knee with crepitus, small effusion      Assessment & Plan:

## 2013-09-25 NOTE — Assessment & Plan Note (Signed)
stable overall by history and exam, recent data reviewed with pt, and pt to continue medical treatment as before,  to f/u any worsening symptoms or concerns Lab Results  Component Value Date   HGBA1C 6.4 03/13/2013

## 2013-09-25 NOTE — Progress Notes (Signed)
Pre-visit discussion using our clinic review tool. No additional management support is needed unless otherwise documented below in the visit note.  

## 2013-09-25 NOTE — Assessment & Plan Note (Signed)
Consider f/u with sport med/dr Katrinka Blazing, or ortho as planned, for tramadol prn

## 2013-09-26 ENCOUNTER — Encounter: Payer: Self-pay | Admitting: Internal Medicine

## 2013-09-26 LAB — LIPID PANEL
HDL: 73.6 mg/dL (ref >39.00)
Total CHOL/HDL Ratio: 3
Triglycerides: 110 mg/dL (ref 0.0–149.0)
VLDL: 22 mg/dL (ref 0.0–40.0)

## 2013-09-26 LAB — BASIC METABOLIC PANEL
BUN: 12 mg/dL (ref 6–23)
CO2: 28 mEq/L (ref 19–32)
Calcium: 9.5 mg/dL (ref 8.4–10.5)
Chloride: 104 mEq/L (ref 96–112)
Glucose, Bld: 86 mg/dL (ref 70–99)
Sodium: 139 mEq/L (ref 135–145)

## 2013-09-26 LAB — HEPATIC FUNCTION PANEL
ALT: 16 U/L (ref 0–35)
Albumin: 4.4 g/dL (ref 3.5–5.2)
Alkaline Phosphatase: 86 U/L (ref 39–117)
Bilirubin, Direct: 0 mg/dL (ref 0.0–0.3)
Total Protein: 8.6 g/dL — ABNORMAL HIGH (ref 6.0–8.3)

## 2013-10-17 HISTORY — PX: COLONOSCOPY: SHX174

## 2013-11-22 ENCOUNTER — Ambulatory Visit (AMBULATORY_SURGERY_CENTER): Payer: Self-pay | Admitting: *Deleted

## 2013-11-22 VITALS — Ht 60.0 in | Wt 220.6 lb

## 2013-11-22 DIAGNOSIS — Z1211 Encounter for screening for malignant neoplasm of colon: Secondary | ICD-10-CM

## 2013-11-22 MED ORDER — MOVIPREP 100 G PO SOLR: ORAL | Status: DC

## 2013-11-22 NOTE — Progress Notes (Signed)
No allergies to eggs or soy. No problems with anesthesia.  

## 2013-11-27 ENCOUNTER — Encounter: Payer: Self-pay | Admitting: Internal Medicine

## 2013-12-06 ENCOUNTER — Ambulatory Visit (AMBULATORY_SURGERY_CENTER): Payer: 59 | Admitting: Internal Medicine

## 2013-12-06 ENCOUNTER — Encounter: Payer: Self-pay | Admitting: Internal Medicine

## 2013-12-06 VITALS — BP 143/84 | HR 75 | Temp 98.1°F | Resp 19 | Ht 60.0 in | Wt 220.0 lb

## 2013-12-06 DIAGNOSIS — Z1211 Encounter for screening for malignant neoplasm of colon: Secondary | ICD-10-CM

## 2013-12-06 MED ORDER — SODIUM CHLORIDE 0.9 % IV SOLN: 500.0000 mL | INTRAVENOUS | Status: DC

## 2013-12-06 NOTE — Op Note (Signed)
[facility] [address] [place], [id number]   COLONOSCOPY PROCEDURE REPORT  PATIENT: Bianca BoxerHairston, Louanne P.  MR#: [id number] BIRTHDATE: 1956-08-10 , 57  yrs. old GENDER: Female ENDOSCOPIST: Beverley FiedlerJay M Delma Drone, MD REFERRED XB:JYNWGBY:James John, M.D. PROCEDURE DATE:  12/06/2013 PROCEDURE:   Colonoscopy, screening First Screening Colonoscopy - Avg.  risk and is 50 yrs.  old or older Yes.  Prior Negative Screening - Now for repeat screening. N/A  History of Adenoma - Now for follow-up colonoscopy & has been > or = to 3 yrs.  N/A  Polyps Removed Today? No.  Recommend repeat exam, <10 yrs? No. ASA CLASS:   Class III INDICATIONS:average risk screening and first colonoscopy. MEDICATIONS: MAC sedation, administered by CRNA and propofol (Diprivan) 350mg  IV  DESCRIPTION OF PROCEDURE:   After the risks benefits and alternatives of the procedure were thoroughly explained, informed consent was obtained.  A digital rectal exam revealed no rectal mass.   The LB [id number]-[id number]  endoscope was introduced through the anus and advanced to the cecum, which was identified by both the appendix and ileocecal valve. No adverse events experienced. The quality of the prep was good, using MoviPrep  The instrument was then slowly withdrawn as the colon was fully examined.   COLON FINDINGS: A normal appearing cecum, ileocecal valve, and appendiceal orifice were identified.  The ascending, hepatic flexure, transverse, splenic flexure, descending, sigmoid colon and rectum appeared unremarkable.  No polyps or cancers were seen. Redundant colon.   Retroflexed views revealed small internal hemorrhoids. The time to cecum=6 minutes 55 seconds.  Withdrawal time=8 minutes 38 seconds.  The scope was withdrawn and the procedure completed.  COMPLICATIONS: There were no complications.  ENDOSCOPIC IMPRESSION: Normal colon  RECOMMENDATIONS: You should continue to follow colorectal cancer screening  guidelines for "routine risk" patients with a repeat colonoscopy in 10 years. There is no need for FOBT (stool) testing for at least 5 years.   eSigned:  Beverley FiedlerJay M Ryiah Bellissimo, MD 12/06/2013 12:01 PM   cc: The Patient and Corwin LevinsJames W John, MD

## 2013-12-06 NOTE — Patient Instructions (Signed)
YOU HAD AN ENDOSCOPIC PROCEDURE TODAY AT THE [facility]: Refer to the procedure report that was given to you for any specific questions about what was found during the examination.  If the procedure report does not answer your questions, please call your gastroenterologist to clarify.  If you requested that your care partner not be given the details of your procedure findings, then the procedure report has been included in a sealed envelope for you to review at your convenience later.  YOU SHOULD EXPECT: Some feelings of bloating in the abdomen. Passage of more gas than usual.  Walking can help get rid of the air that was put into your GI tract during the procedure and reduce the bloating. If you had a lower endoscopy (such as a colonoscopy or flexible sigmoidoscopy) you may notice spotting of blood in your stool or on the toilet paper. If you underwent a bowel prep for your procedure, then you may not have a normal bowel movement for a few days.  DIET: Your first meal following the procedure should be a light meal and then it is ok to progress to your normal diet.  A half-sandwich or bowl of soup is an example of a good first meal.  Heavy or fried foods are harder to digest and may make you feel nauseous or bloated.  Likewise meals heavy in dairy and vegetables can cause extra gas to form and this can also increase the bloating.  Drink plenty of fluids but you should avoid alcoholic beverages for 24 hours.  ACTIVITY: Your care partner should take you home directly after the procedure.  You should plan to take it easy, moving slowly for the rest of the day.  You can resume normal activity the day after the procedure however you should NOT DRIVE or use heavy machinery for 24 hours (because of the sedation medicines used during the test).    SYMPTOMS TO REPORT IMMEDIATELY: A gastroenterologist can be reached at any hour.  During normal business hours, 8:30 AM to 5:00 PM Monday through Friday,  call [phone].  After hours and on weekends, please call the GI answering service at [phone] who will take a message and have the physician on call contact you.   Following lower endoscopy (colonoscopy or flexible sigmoidoscopy):  Excessive amounts of blood in the stool  Significant tenderness or worsening of abdominal pains  Swelling of the abdomen that is new, acute  Fever of 100F or higher   FOLLOW UP: If any biopsies were taken you will be contacted by phone or by letter within the next 1-3 weeks.  Call your gastroenterologist if you have not heard about the biopsies in 3 weeks.  Our staff will call the home number listed on your records the next business day following your procedure to check on you and address any questions or concerns that you may have at that time regarding the information given to you following your procedure. This is a courtesy call and so if there is no answer at the home number and we have not heard from you through the emergency physician on call, we will assume that you have returned to your regular daily activities without incident.  SIGNATURES/CONFIDENTIALITY: You and/or your care partner have signed paperwork which will be entered into your electronic medical record.  These signatures attest to the fact that that the information above on your After Visit Summary has been reviewed and is understood.  Full responsibility of the confidentiality of   this discharge information lies with you and/or your care-partner.  Normal exam.  Repeat colonoscopy in 10 years 2025.  

## 2013-12-09 ENCOUNTER — Telehealth: Payer: Self-pay | Admitting: *Deleted

## 2013-12-09 NOTE — Telephone Encounter (Signed)
Left message that we called for f/u 

## 2014-01-29 ENCOUNTER — Other Ambulatory Visit: Payer: Self-pay | Admitting: Obstetrics and Gynecology

## 2014-01-31 ENCOUNTER — Telehealth: Payer: Self-pay

## 2014-01-31 NOTE — Telephone Encounter (Signed)
Pt called [facility] c/o abdominal pain, last seen 09/25/13 and will need an office visit to discuss. Transferred to scheduling to see about possible Saturday clinic.

## 2014-06-13 ENCOUNTER — Ambulatory Visit (INDEPENDENT_AMBULATORY_CARE_PROVIDER_SITE_OTHER): Payer: 59 | Admitting: Internal Medicine

## 2014-06-13 ENCOUNTER — Encounter: Payer: Self-pay | Admitting: Internal Medicine

## 2014-06-13 VITALS — BP 128/90 | HR 80 | Temp 97.9°F | Wt 218.0 lb

## 2014-06-13 DIAGNOSIS — I10 Essential (primary) hypertension: Secondary | ICD-10-CM

## 2014-06-13 DIAGNOSIS — R7309 Other abnormal glucose: Secondary | ICD-10-CM

## 2014-06-13 DIAGNOSIS — M545 Low back pain, unspecified: Secondary | ICD-10-CM

## 2014-06-13 DIAGNOSIS — Z0000 Encounter for general adult medical examination without abnormal findings: Secondary | ICD-10-CM

## 2014-06-13 DIAGNOSIS — R7302 Impaired glucose tolerance (oral): Secondary | ICD-10-CM

## 2014-06-13 MED ORDER — PREDNISONE 10 MG PO TABS: ORAL_TABLET | ORAL | Status: DC

## 2014-06-13 MED ORDER — TIZANIDINE HCL 4 MG PO TABS: 4.0000 mg | ORAL_TABLET | Freq: Four times a day (QID) | ORAL | Status: DC | PRN

## 2014-06-13 MED ORDER — AMLODIPINE BESYLATE 10 MG PO TABS: 10.0000 mg | ORAL_TABLET | Freq: Every day | ORAL | Status: DC

## 2014-06-13 MED ORDER — HYDROCODONE-ACETAMINOPHEN 5-325 MG PO TABS: 1.0000 | ORAL_TABLET | Freq: Four times a day (QID) | ORAL | Status: DC | PRN

## 2014-06-13 MED ORDER — IRBESARTAN 300 MG PO TABS: 300.0000 mg | ORAL_TABLET | Freq: Every day | ORAL | Status: DC

## 2014-06-13 NOTE — Progress Notes (Signed)
Subjective:    Patient ID: Bianca Campbell, female    DOB: 1956-05-17, 58 y.o.   MRN: [id number]  HPI  Here with recent acute LBP -   S/p left knee arthroscopy per Dr Thurston Hole may 2015, , completed PT for one month. Rides stationary bike 10 min every few days.  Was going to ride the outside bike yest, but unfort with onset new left LBP, assoc with traveling with sleeping on a soft bed Pt continues to have recurring freq bilatsharp 10/10 LBP x 4-5 days - worse to get up, worse to bend, and worse to sit in the car, but no bowel or bladder change, fever, wt loss,  worsening LE pain/numbness/weakness, gait change or falls.  Denies urinary symptoms such as dysuria, frequency, urgency, flank pain, hematuria or n/v, fever, chills. Denies worsening reflux, abd pain, dysphagia, n/v, bowel change or blood. No prior hx of same pain, no xrays, no prior surgury or ESI. Last films 2006 with normal appearing LS spine  Pt denies chest pain, increased sob or doe, wheezing, orthopnea, PND, increased LE swelling, palpitations, dizziness or syncope.  Pt denies new neurological symptoms such as new headache, or facial or extremity weakness or numbness.  Pt denies polydipsia, polyuria,  Past Medical History  Diagnosis Date  . ALLERGIC RHINITIS 09/11/2007  . BACK PAIN 11/14/2007  . EXTERNAL OTITIS 09/11/2007  . GERD 06/06/2007  . GOUT 11/14/2007  . HYPERTENSION 06/06/2007  . INTERMITTENT VERTIGO 09/11/2007  . Irritable bowel syndrome 06/06/2007  . LOW BACK PAIN 06/06/2007  . OSTEOARTHRITIS 06/06/2007  . SHOULDER PAIN, RIGHT 12/30/2008  . Hernia   . Anemia, unspecified 08/31/2012  . Other and unspecified hyperlipidemia 03/13/2013   Past Surgical History  Procedure Laterality Date  . Cesarean section  1987, 1991    x 2  . Cholecystectomy  1991  . Inguinal herniorrhapy      numerous hernia repairs 1996 to 2011  . Exploratory laparotomy      bowel obstruction  . S/p incarcerated hernia and ovary cyst jan 2011  2011     reports that she has never smoked. She has never used smokeless tobacco. She reports that she does not drink alcohol or use illicit drugs. family history includes Arthritis in her mother; Colon polyps (age of onset: 40) in her father. There is no history of Colon cancer. Current Outpatient Prescriptions on File Prior to Visit  Medication Sig Dispense Refill  . amLODipine-olmesartan (AZOR) 10-40 MG per tablet Take 1 tablet by mouth daily.  90 tablet  3  . aspirin 81 MG tablet Take 81 mg by mouth daily.        . cholecalciferol (VITAMIN D) 1000 UNITS tablet Take 1,000 Units by mouth daily.      Marland Kitchen glucosamine-chondroitin 500-400 MG tablet Take 1 tablet by mouth 2 (two) times daily.      . Magnesium Oxide 400 MG CAPS Take by mouth daily.      . Multiple Vitamin (MULTIVITAMIN) tablet Take 1 tablet by mouth daily.      . nabumetone (RELAFEN) 500 MG tablet Take 500 mg by mouth 2 (two) times daily.      . naproxen sodium (ANAPROX) 220 MG tablet Take 220 mg by mouth 2 (two) times daily with a meal.      . nitroGLYCERIN (NITROSTAT) 0.3 MG SL tablet Place 1 tablet (0.3 mg total) under the tongue every 5 (five) minutes as needed for chest pain.  90 tablet  12  .  traMADol (ULTRAM) 50 MG tablet Take 1 tablet (50 mg total) by mouth every 6 (six) hours as needed.  120 tablet  2  . TURMERIC PO Take by mouth 2 (two) times daily.       No current facility-administered medications on file prior to visit.    Review of Systems  Constitutional: Negative for unusual diaphoresis or other sweats  HENT: Negative for ringing in ear Eyes: Negative for double vision or worsening visual disturbance.  Respiratory: Negative for choking and stridor.   Gastrointestinal: Negative for vomiting or other signifcant bowel change Genitourinary: Negative for hematuria or decreased urine volume.  Musculoskeletal: Negative for other MSK pain or swelling Skin: Negative for color change and worsening wound.  Neurological:  Negative for tremors and numbness other than noted  Psychiatric/Behavioral: Negative for decreased concentration or agitation other than above       Objective:   Physical Exam BP 128/90  Pulse 80  Temp(Src) 97.9 F (36.6 C) (Oral)  Wt 218 lb (98.884 kg)  SpO2 97% VS noted,  Constitutional: Pt appears well-developed, well-nourished.  HENT: Head: NCAT.  Right Ear: External ear normal.  Left Ear: External ear normal.  Eyes: . Pupils are equal, round, and reactive to light. Conjunctivae and EOM are normal Neck: Normal range of motion. Neck supple.  Cardiovascular: Normal rate and regular rhythm.   Pulmonary/Chest: Effort normal and breath sounds normal.  Abd:  Soft, NT, ND, + BS Spine with tender lowest mid lumbar, no swelling or rash Neurological: Pt is alert. Not confused , motor 5/5, dtr/sens intact Skin: Skin is warm. No rash Psychiatric: Pt behavior is normal. No agitation.      Assessment & Plan:

## 2014-06-13 NOTE — Assessment & Plan Note (Signed)
Asympt, for f/u a1c, cont diet/wt loss efforts,  to f/u any worsening symptoms or concerns Lab Results  Component Value Date   HGBA1C 6.2 09/25/2013

## 2014-06-13 NOTE — Patient Instructions (Signed)
Please take all new medication as prescribed - the hydrocodone for pain, muscle relaxer as needed, and prednisone (short course)  Please continue all other medications as before, including the nabumetome  OK to stop the Azor after you finish your current bottle supply  Please take all new medication as prescribed- the amlodipine 10 mg (generic), avapro 300 mg per day (generic)  Please have the pharmacy call with any other refills you may need.  Please keep your appointments with your specialists as you may have planned  No further lab work needed today  Please return in 3 months, or sooner if needed, with Lab testing done 3-5 days before

## 2014-06-13 NOTE — Progress Notes (Signed)
Pre visit review using our clinic review tool, if applicable. No additional management support is needed unless otherwise documented below in the visit note. 

## 2014-06-13 NOTE — Assessment & Plan Note (Signed)
azor too expensive now - to change to amlod 10 qd, and avapro 300 qd (both generic)

## 2014-06-26 ENCOUNTER — Telehealth: Payer: Self-pay | Admitting: *Deleted

## 2014-06-26 NOTE — Telephone Encounter (Signed)
Pt is wanting to ask md is it ok for her to take Garcina  Cambogia supplement, along with her prescribe medications...Bianca Campbell

## 2014-06-26 NOTE — Telephone Encounter (Signed)
I would not recommend this, as it is not FDA approved

## 2014-06-26 NOTE — Telephone Encounter (Signed)
Notified pt with md response.../lmb 

## 2014-07-29 ENCOUNTER — Other Ambulatory Visit: Payer: Self-pay | Admitting: Obstetrics and Gynecology

## 2014-07-29 DIAGNOSIS — N63 Unspecified lump in unspecified breast: Secondary | ICD-10-CM

## 2014-08-04 ENCOUNTER — Ambulatory Visit
Admission: RE | Admit: 2014-08-04 | Discharge: 2014-08-04 | Disposition: A | Discharge status: Home / Self Care | Payer: 59 | Source: Ambulatory Visit | Attending: Obstetrics and Gynecology | Admitting: Obstetrics and Gynecology

## 2014-08-04 DIAGNOSIS — N63 Unspecified lump in unspecified breast: Secondary | ICD-10-CM

## 2014-08-04 LAB — HM MAMMOGRAPHY

## 2014-09-09 ENCOUNTER — Ambulatory Visit (INDEPENDENT_AMBULATORY_CARE_PROVIDER_SITE_OTHER): Payer: 59 | Admitting: Internal Medicine

## 2014-09-09 ENCOUNTER — Encounter: Payer: Self-pay | Admitting: Internal Medicine

## 2014-09-09 VITALS — BP 120/88 | HR 77 | Temp 98.7°F | Ht 60.0 in | Wt 221.8 lb

## 2014-09-09 DIAGNOSIS — R778 Other specified abnormalities of plasma proteins: Secondary | ICD-10-CM | POA: Insufficient documentation

## 2014-09-09 DIAGNOSIS — R7302 Impaired glucose tolerance (oral): Secondary | ICD-10-CM

## 2014-09-09 DIAGNOSIS — Z0000 Encounter for general adult medical examination without abnormal findings: Secondary | ICD-10-CM

## 2014-09-09 NOTE — Progress Notes (Signed)
Subjective:    Patient ID: Bianca Campbell, female    DOB: April 13, 1956, 58 y.o.   MRN: [id number]  HPI  Here for wellness and f/u;  Overall doing ok;  Pt denies CP, worsening SOB, DOE, wheezing, orthopnea, PND, worsening LE edema, palpitations, dizziness or syncope.  Pt denies neurological change such as new headache, facial or extremity weakness.  Pt denies polydipsia, polyuria, or low sugar symptoms. Pt states overall good compliance with treatment and medications, good tolerability, and has been trying to follow lower cholesterol diet.  Pt denies worsening depressive symptoms, suicidal ideation or panic. No fever, night sweats, wt loss, loss of appetite, or other constitutional symptoms.  Pt states good ability with ADL's, has low fall risk, home safety reviewed and adequate, no other significant changes in hearing or vision, and only occasionally active with exercise, due to bilat knee stiffness, plans to try to do more soon. S/p left knee arthroscopy may 2015. Has been holding off on right knee surgury , may do this soon as well.. No recent falls.   Decliens flu shot.  Has an irritated skin tag/mold to left anterior neck, requests derm  Pt continues to have recurring LBP without change in severity, bowel or bladder change, fever, wt loss,  worsening LE pain/numbness/weakness, gait change or falls.  Past Medical History  Diagnosis Date  . ALLERGIC RHINITIS 09/11/2007  . BACK PAIN 11/14/2007  . EXTERNAL OTITIS 09/11/2007  . GERD 06/06/2007  . GOUT 11/14/2007  . HYPERTENSION 06/06/2007  . INTERMITTENT VERTIGO 09/11/2007  . Irritable bowel syndrome 06/06/2007  . LOW BACK PAIN 06/06/2007  . OSTEOARTHRITIS 06/06/2007  . SHOULDER PAIN, RIGHT 12/30/2008  . Hernia   . Anemia, unspecified 08/31/2012  . Other and unspecified hyperlipidemia 03/13/2013   Past Surgical History  Procedure Laterality Date  . Cesarean section  1987, 1991    x 2  . Cholecystectomy  1991  . Inguinal herniorrhapy     numerous hernia repairs 1996 to 2011  . Exploratory laparotomy      bowel obstruction  . S/p incarcerated hernia and ovary cyst jan 2011  2011    reports that she has never smoked. She has never used smokeless tobacco. She reports that she does not drink alcohol or use illicit drugs. family history includes Arthritis in her mother; Colon polyps (age of onset: 468) in her father. There is no history of Colon cancer. No Known Allergies Current Outpatient Prescriptions on File Prior to Visit  Medication Sig Dispense Refill  . amLODipine (NORVASC) 10 MG tablet Take 1 tablet (10 mg total) by mouth daily. 90 tablet 3  . amLODipine-olmesartan (AZOR) 10-40 MG per tablet Take 1 tablet by mouth daily. 90 tablet 3  . aspirin 81 MG tablet Take 81 mg by mouth daily.      . cholecalciferol (VITAMIN D) 1000 UNITS tablet Take 1,000 Units by mouth daily.    Marland Kitchen. glucosamine-chondroitin 500-400 MG tablet Take 1 tablet by mouth 2 (two) times daily.    . irbesartan (AVAPRO) 300 MG tablet Take 1 tablet (300 mg total) by mouth daily. 90 tablet 3  . Magnesium Oxide 400 MG CAPS Take by mouth daily.    . Multiple Vitamin (MULTIVITAMIN) tablet Take 1 tablet by mouth daily.    . nabumetone (RELAFEN) 500 MG tablet Take 500 mg by mouth 2 (two) times daily.    . naproxen sodium (ANAPROX) 220 MG tablet Take 220 mg by mouth 2 (two) times daily with a meal.    .  tiZANidine (ZANAFLEX) 4 MG tablet Take 1 tablet (4 mg total) by mouth every 6 (six) hours as needed for muscle spasms. 40 tablet 1  . traMADol (ULTRAM) 50 MG tablet Take 1 tablet (50 mg total) by mouth every 6 (six) hours as needed. 120 tablet 2  . TURMERIC PO Take by mouth 2 (two) times daily.    . nitroGLYCERIN (NITROSTAT) 0.3 MG SL tablet Place 1 tablet (0.3 mg total) under the tongue every 5 (five) minutes as needed for chest pain. 90 tablet 12   No current facility-administered medications on file prior to visit.   Review of Systems Constitutional: Negative for  increased diaphoresis, other activity, appetite or other siginficant weight change  HENT: Negative for worsening hearing loss, ear pain, facial swelling, mouth sores and neck stiffness.   Eyes: Negative for other worsening pain, redness or visual disturbance.  Respiratory: Negative for shortness of breath and wheezing.   Cardiovascular: Negative for chest pain and palpitations.  Gastrointestinal: Negative for diarrhea, blood in stool, abdominal distention or other pain Genitourinary: Negative for hematuria, flank pain or change in urine volume.  Musculoskeletal: Negative for myalgias or other joint complaints.  Skin: Negative for color change and wound.  Neurological: Negative for syncope and numbness. other than noted Hematological: Negative for adenopathy. or other swelling Psychiatric/Behavioral: Negative for hallucinations, self-injury, decreased concentration or other worsening agitation.      Objective:   Physical Exam BP 120/88 mmHg  Pulse 77  Temp(Src) 98.7 F (37.1 C) (Oral)  Ht 5' (1.524 m)  Wt 221 lb 12 oz (100.585 kg)  BMI 43.31 kg/m2  SpO2 96% VS noted,  Constitutional: Pt is oriented to person, place, and time. Appears well-developed and well-nourished.  Head: Normocephalic and atraumatic.  Right Ear: External ear normal.  Left Ear: External ear normal.  Nose: Nose normal.  Mouth/Throat: Oropharynx is clear and moist.  Eyes: Conjunctivae and EOM are normal. Pupils are equal, round, and reactive to light.  Neck: Normal range of motion. Neck supple. No JVD present. No tracheal deviation present.  Cardiovascular: Normal rate, regular rhythm, normal heart sounds and intact distal pulses.   Pulmonary/Chest: Effort normal and breath sounds without rales or wheezing  Abdominal: Soft. Bowel sounds are normal. NT. No HSM  Musculoskeletal: Normal range of motion. Exhibits no edema.  Lymphadenopathy:  Has no cervical adenopathy.  Neurological: Pt is alert and oriented to  person, place, and time. Pt has normal reflexes. No cranial nerve deficit. Motor grossly intact Skin: Skin is warm and dry. No rash noted. left ant neck with raised mod size skin tag vs mole Psychiatric:  Has normal mood and affect. Behavior is normal.  Mild right knee crepitus noted, no effusion, NT Wt Readings from Last 3 Encounters:  09/09/14 221 lb 12 oz (100.585 kg)  06/13/14 218 lb (98.884 kg)  12/06/13 220 lb (99.791 kg)   BP Readings from Last 3 Encounters:  09/09/14 120/88  06/13/14 128/90  12/06/13 143/84      Assessment & Plan:

## 2014-09-09 NOTE — Patient Instructions (Signed)

## 2014-09-09 NOTE — Assessment & Plan Note (Signed)

## 2014-09-09 NOTE — Assessment & Plan Note (Signed)
stable overall by history and exam, recent data reviewed with pt, and pt to continue medical treatment as before,  to f/u any worsening symptoms or concerns Lab Results  Component Value Date   HGBA1C 6.2 09/25/2013

## 2014-09-09 NOTE — Progress Notes (Signed)
Pre visit review using our clinic review tool, if applicable. No additional management support is needed unless otherwise documented below in the visit note. 

## 2014-12-30 ENCOUNTER — Encounter: Payer: Self-pay | Admitting: Internal Medicine

## 2014-12-30 ENCOUNTER — Ambulatory Visit (INDEPENDENT_AMBULATORY_CARE_PROVIDER_SITE_OTHER): Payer: 59 | Admitting: Internal Medicine

## 2014-12-30 ENCOUNTER — Ambulatory Visit (INDEPENDENT_AMBULATORY_CARE_PROVIDER_SITE_OTHER)
Admission: RE | Admit: 2014-12-30 | Discharge: 2014-12-30 | Disposition: A | Discharge status: Home / Self Care | Payer: 59 | Source: Ambulatory Visit | Attending: Internal Medicine | Admitting: Internal Medicine

## 2014-12-30 VITALS — BP 132/88 | HR 66 | Temp 98.2°F | Resp 20 | Ht 60.0 in | Wt 221.1 lb

## 2014-12-30 DIAGNOSIS — M25511 Pain in right shoulder: Secondary | ICD-10-CM

## 2014-12-30 DIAGNOSIS — M545 Low back pain, unspecified: Secondary | ICD-10-CM

## 2014-12-30 DIAGNOSIS — M21611 Bunion of right foot: Secondary | ICD-10-CM | POA: Insufficient documentation

## 2014-12-30 DIAGNOSIS — M2011 Hallux valgus (acquired), right foot: Secondary | ICD-10-CM

## 2014-12-30 MED ORDER — NAPROXEN 500 MG PO TABS: 500.0000 mg | ORAL_TABLET | Freq: Two times a day (BID) | ORAL | Status: DC

## 2014-12-30 NOTE — Patient Instructions (Signed)
OK to increase the naproxen to 500 mg twice per day as needed  Please continue all other medications as before, and refills have been done if requested.  Please have the pharmacy call with any other refills you may need.  Please continue your efforts at being more active, low cholesterol diet, and weight control.  You are otherwise up to date with prevention measures today.  Please keep your appointments with your specialists as you may have planned  Please go to the XRAY Department in the Basement (go straight as you get off the elevator) for the x-ray testing  You will be contacted by phone if any changes need to be made immediately.  Otherwise, you will receive a letter about your results with an explanation, but please check with MyChart first.  Please remember to sign up for MyChart if you have not done so, as this will be important to you in the future with finding out test results, communicating by private email, and scheduling acute appointments online when needed.  Please call if you would like to see Dr Smith/sport medicine for the shoulder and right lower back if not improved in 1-2 weeks  Please call if you would like podiatry referral for the right bunion as well

## 2014-12-30 NOTE — Progress Notes (Signed)
Pre visit review using our clinic review tool, if applicable. No additional management support is needed unless otherwise documented below in the visit note. 

## 2015-01-03 NOTE — Assessment & Plan Note (Signed)
Mild, for nsaid prn, declines podiatry at this time,  to f/u any worsening symptoms or concerns

## 2015-01-03 NOTE — Assessment & Plan Note (Signed)
C/w with MSK strain, for nsaid/muscle relaxant prn,  to f/u any worsening symptoms or concerns

## 2015-01-03 NOTE — Assessment & Plan Note (Addendum)
C/w MSK strain but cant r/o other - for ls spine films, for increased naproxen 500 bid prn, tizanidine prn,  to f/u any worsening symptoms or concerns

## 2015-01-03 NOTE — Progress Notes (Signed)
Subjective:    Patient ID: Bianca Campbell, female    DOB: 1956/06/04, 59 y.o.   MRN: [id number]  HPI  Here to c/o tender/sore area to right lateral deltoid insertion area, worse to abduct, but not to forward elevate, worse to lie on right side, not better with current low dose nsaid.  Also with LBP - Pt c/o 3-4 days right mild "sticking" LBP without change in severity, bowel or bladder change, fever, wt loss,  worsening LE pain/numbness/weakness, gait change or falls, but worse to bend to tie shoes, or crossing left leg over right.  Pt denies chest pain, increased sob or doe, wheezing, orthopnea, PND, increased LE swelling, palpitations, dizziness or syncope.   Pt denies polydipsia, polyuria  Also with mild to mod sharp tender right bunion, with mild swelling, No prior imaging spine, no recent falls Past Medical History  Diagnosis Date  . ALLERGIC RHINITIS 09/11/2007  . BACK PAIN 11/14/2007  . EXTERNAL OTITIS 09/11/2007  . GERD 06/06/2007  . GOUT 11/14/2007  . HYPERTENSION 06/06/2007  . INTERMITTENT VERTIGO 09/11/2007  . Irritable bowel syndrome 06/06/2007  . LOW BACK PAIN 06/06/2007  . OSTEOARTHRITIS 06/06/2007  . SHOULDER PAIN, RIGHT 12/30/2008  . Hernia   . Anemia, unspecified 08/31/2012  . Other and unspecified hyperlipidemia 03/13/2013   Past Surgical History  Procedure Laterality Date  . Cesarean section  1987, 1991    x 2  . Cholecystectomy  1991  . Inguinal herniorrhapy      numerous hernia repairs 1996 to 2011  . Exploratory laparotomy      bowel obstruction  . S/p incarcerated hernia and ovary cyst jan 2011  2011    reports that she has never smoked. She has never used smokeless tobacco. She reports that she does not drink alcohol or use illicit drugs. family history includes Arthritis in her mother; Colon polyps (age of onset: 20) in her father. There is no history of Colon cancer. No Known Allergies Current Outpatient Prescriptions on File Prior to Visit  Medication Sig  Dispense Refill  . amLODipine (NORVASC) 10 MG tablet Take 1 tablet (10 mg total) by mouth daily. 90 tablet 3  . aspirin 81 MG tablet Take 81 mg by mouth daily.      . cholecalciferol (VITAMIN D) 1000 UNITS tablet Take 1,000 Units by mouth daily.    Marland Kitchen glucosamine-chondroitin 500-400 MG tablet Take 1 tablet by mouth 2 (two) times daily.    . irbesartan (AVAPRO) 300 MG tablet Take 1 tablet (300 mg total) by mouth daily. 90 tablet 3  . Magnesium Oxide 400 MG CAPS Take by mouth daily.    . Multiple Vitamin (MULTIVITAMIN) tablet Take 1 tablet by mouth daily.    . nabumetone (RELAFEN) 500 MG tablet Take 500 mg by mouth 2 (two) times daily.    Marland Kitchen tiZANidine (ZANAFLEX) 4 MG tablet Take 1 tablet (4 mg total) by mouth every 6 (six) hours as needed for muscle spasms. 40 tablet 1  . traMADol (ULTRAM) 50 MG tablet Take 1 tablet (50 mg total) by mouth every 6 (six) hours as needed. 120 tablet 2  . TURMERIC PO Take by mouth 2 (two) times daily.    . nitroGLYCERIN (NITROSTAT) 0.3 MG SL tablet Place 1 tablet (0.3 mg total) under the tongue every 5 (five) minutes as needed for chest pain. 90 tablet 12   No current facility-administered medications on file prior to visit.   Review of Systems  Constitutional: Negative for unusual  diaphoresis or night sweats HENT: Negative for ringing in ear or discharge Eyes: Negative for double vision or worsening visual disturbance.  Respiratory: Negative for choking and stridor.   Gastrointestinal: Negative for vomiting or other signifcant bowel change Genitourinary: Negative for hematuria or change in urine volume.  Musculoskeletal: Negative for other MSK pain or swelling Skin: Negative for color change and worsening wound.  Neurological: Negative for tremors and numbness other than noted  Psychiatric/Behavioral: Negative for decreased concentration or agitation other than above       Objective:   Physical Exam BP 132/88 mmHg  Pulse 66  Temp(Src) 98.2 F (36.8 C)  (Oral)  Resp 20  Ht 5' (1.524 m)  Wt 221 lb 1.3 oz (100.281 kg)  BMI 43.18 kg/m2  SpO2 99% VS noted,  Constitutional: Pt appears in no significant distress HENT: Head: NCAT.  Right Ear: External ear normal.  Left Ear: External ear normal.  Eyes: . Pupils are equal, round, and reactive to light. Conjunctivae and EOM are normal Neck: Normal range of motion. Neck supple.  Cardiovascular: Normal rate and regular rhythm.   Pulmonary/Chest: Effort normal and breath sounds without rales or wheezing.  Abd:  Soft, NT, ND, + BS Neurological: Pt is alert. Not confused , motor grossly intact Skin: Skin is warm. No rash, no LE edema Psychiatric: Pt behavior is normal. No agitation.  Tender area noted at deltoid insertion site at mid humerus, shoulder o/w NT, and FROM Spine nontender; no signficant bilat lumbar paravertebral tender or spasm     Assessment & Plan:

## 2015-01-08 ENCOUNTER — Telehealth: Payer: Self-pay | Admitting: Internal Medicine

## 2015-01-08 NOTE — Telephone Encounter (Signed)
Patient is requesting call back in regards to xray results.

## 2015-01-13 NOTE — Telephone Encounter (Signed)
Called patient back and let her know her xrays results.

## 2015-01-15 ENCOUNTER — Ambulatory Visit (INDEPENDENT_AMBULATORY_CARE_PROVIDER_SITE_OTHER): Payer: 59 | Admitting: Internal Medicine

## 2015-01-15 ENCOUNTER — Encounter: Payer: Self-pay | Admitting: Internal Medicine

## 2015-01-15 VITALS — BP 128/88 | HR 82 | Temp 98.3°F | Resp 18 | Ht 60.0 in | Wt 222.0 lb

## 2015-01-15 DIAGNOSIS — M79672 Pain in left foot: Secondary | ICD-10-CM

## 2015-01-15 DIAGNOSIS — M5489 Other dorsalgia: Secondary | ICD-10-CM

## 2015-01-15 DIAGNOSIS — I10 Essential (primary) hypertension: Secondary | ICD-10-CM | POA: Diagnosis not present

## 2015-01-15 DIAGNOSIS — R52 Pain, unspecified: Secondary | ICD-10-CM

## 2015-01-15 DIAGNOSIS — M79671 Pain in right foot: Secondary | ICD-10-CM | POA: Diagnosis not present

## 2015-01-15 MED ORDER — TRAMADOL HCL 50 MG PO TABS: 50.0000 mg | ORAL_TABLET | Freq: Four times a day (QID) | ORAL | Status: DC | PRN

## 2015-01-15 MED ORDER — TIZANIDINE HCL 4 MG PO TABS: 4.0000 mg | ORAL_TABLET | Freq: Four times a day (QID) | ORAL | Status: DC | PRN

## 2015-01-15 NOTE — Patient Instructions (Signed)
Please take all new medication as prescribed - the pain medication, and muscle relaxer as needed  Please continue all other medications as before, and refills have been done if requested.  Please have the pharmacy call with any other refills you may need.  Please keep your appointments with your specialists as you may have planned   
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
## 2015-01-15 NOTE — Progress Notes (Signed)
Pre visit review using our clinic review tool, if applicable. No additional management support is needed unless otherwise documented below in the visit note. 

## 2015-01-15 NOTE — Assessment & Plan Note (Signed)
stable overall by history and exam, recent data reviewed with pt, and pt to continue medical treatment as before,  to f/u any worsening symptoms or concerns BP Readings from Last 3 Encounters:  01/15/15 128/88  12/30/14 132/88  09/09/14 120/88

## 2015-01-15 NOTE — Assessment & Plan Note (Signed)
Neck and left lower back, neuro exam stable, c/w strain, for tramadol/muscle relaxer prn, . to f/u any worsening symptoms or concerns
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
## 2015-02-23 ENCOUNTER — Telehealth: Payer: Self-pay | Admitting: Internal Medicine

## 2015-02-23 NOTE — Telephone Encounter (Signed)
Pt called in and wanted to know if Dr Jonny RuizJohn could call her in the meds she had last time for her stomach spasmus.  She was unsure of the name of the meds?      Best number [phone]

## 2015-02-25 NOTE — Telephone Encounter (Signed)
I am not sure to what the pt refers  I suppose she could mean flexeril muscle relaxer or Bentyl ?

## 2015-02-26 MED ORDER — DICYCLOMINE HCL 10 MG PO CAPS: 10.0000 mg | ORAL_CAPSULE | Freq: Three times a day (TID) | ORAL | Status: DC

## 2015-02-26 NOTE — Telephone Encounter (Signed)
Pt informed that rx was sent to pharm

## 2015-02-26 NOTE — Telephone Encounter (Signed)
Called pt back verified which med wanting the bentyl sent to [facility]...Raechel Chute/lmb

## 2015-02-26 NOTE — Telephone Encounter (Signed)
Bentyl sent to [facility] erx

## 2015-09-14 ENCOUNTER — Other Ambulatory Visit: Payer: Self-pay | Admitting: Internal Medicine

## 2015-09-15 ENCOUNTER — Encounter: Payer: Self-pay | Admitting: Internal Medicine

## 2015-09-15 ENCOUNTER — Other Ambulatory Visit (INDEPENDENT_AMBULATORY_CARE_PROVIDER_SITE_OTHER): Payer: 59

## 2015-09-15 ENCOUNTER — Ambulatory Visit (INDEPENDENT_AMBULATORY_CARE_PROVIDER_SITE_OTHER): Payer: 59 | Admitting: Internal Medicine

## 2015-09-15 VITALS — BP 148/98 | HR 76 | Temp 97.7°F | Ht 60.0 in | Wt 228.0 lb

## 2015-09-15 DIAGNOSIS — I951 Orthostatic hypotension: Secondary | ICD-10-CM

## 2015-09-15 DIAGNOSIS — R7302 Impaired glucose tolerance (oral): Secondary | ICD-10-CM | POA: Diagnosis not present

## 2015-09-15 DIAGNOSIS — Z0000 Encounter for general adult medical examination without abnormal findings: Secondary | ICD-10-CM

## 2015-09-15 DIAGNOSIS — Z23 Encounter for immunization: Secondary | ICD-10-CM

## 2015-09-15 DIAGNOSIS — E785 Hyperlipidemia, unspecified: Secondary | ICD-10-CM

## 2015-09-15 LAB — LIPID PANEL
CHOL/HDL RATIO: 3
CHOLESTEROL: 171 mg/dL (ref 0–200)
HDL: 63.9 mg/dL (ref >39.00)
LDL CALC: 94 mg/dL (ref 0–99)
NONHDL: 107.1
TRIGLYCERIDES: 64 mg/dL (ref 0.0–149.0)
VLDL: 12.8 mg/dL (ref 0.0–40.0)

## 2015-09-15 LAB — BASIC METABOLIC PANEL
BUN: 10 mg/dL (ref 6–23)
CHLORIDE: 107 meq/L (ref 96–112)
CO2: 29 mEq/L (ref 19–32)
Calcium: 9.4 mg/dL (ref 8.4–10.5)
Creatinine, Ser: 0.52 mg/dL (ref 0.40–1.20)
GFR: 155.51 mL/min (ref >60.00)
Glucose, Bld: 103 mg/dL — ABNORMAL HIGH (ref 70–99)
POTASSIUM: 4.2 meq/L (ref 3.5–5.1)
SODIUM: 143 meq/L (ref 135–145)

## 2015-09-15 LAB — URINALYSIS, ROUTINE W REFLEX MICROSCOPIC
BILIRUBIN URINE: NEGATIVE
Hgb urine dipstick: NEGATIVE
KETONES UR: NEGATIVE
Leukocytes, UA: NEGATIVE
NITRITE: NEGATIVE
PH: 5.5 (ref 5.0–8.0)
Specific Gravity, Urine: 1.025 (ref 1.000–1.030)
Total Protein, Urine: NEGATIVE
Urine Glucose: NEGATIVE
Urobilinogen, UA: 0.2 (ref 0.0–1.0)

## 2015-09-15 LAB — CBC WITH DIFFERENTIAL/PLATELET
BASOS PCT: 0.6 % (ref 0.0–3.0)
Basophils Absolute: 0 10*3/uL (ref 0.0–0.1)
EOS ABS: 0.2 10*3/uL (ref 0.0–0.7)
EOS PCT: 4.7 % (ref 0.0–5.0)
HEMATOCRIT: 36.7 % (ref 36.0–46.0)
Hemoglobin: 12 g/dL (ref 12.0–15.0)
Lymphocytes Relative: 32.2 % (ref 12.0–46.0)
Lymphs Abs: 1.6 10*3/uL (ref 0.7–4.0)
MCHC: 32.6 g/dL (ref 30.0–36.0)
MCV: 81.1 fl (ref 78.0–100.0)
MONO ABS: 0.4 10*3/uL (ref 0.1–1.0)
Monocytes Relative: 8.6 % (ref 3.0–12.0)
NEUTROS ABS: 2.6 10*3/uL (ref 1.4–7.7)
Neutrophils Relative %: 53.9 % (ref 43.0–77.0)
PLATELETS: 288 10*3/uL (ref 150.0–400.0)
RBC: 4.52 Mil/uL (ref 3.87–5.11)
RDW: 13.9 % (ref 11.5–15.5)
WBC: 4.9 10*3/uL (ref 4.0–10.5)

## 2015-09-15 LAB — HEPATIC FUNCTION PANEL
ALK PHOS: 83 U/L (ref 39–117)
ALT: 15 U/L (ref 0–35)
AST: 18 U/L (ref 0–37)
Albumin: 3.9 g/dL (ref 3.5–5.2)
BILIRUBIN DIRECT: 0.1 mg/dL (ref 0.0–0.3)
BILIRUBIN TOTAL: 0.4 mg/dL (ref 0.2–1.2)
Total Protein: 7.9 g/dL (ref 6.0–8.3)

## 2015-09-15 LAB — TSH: TSH: 1.21 u[IU]/mL (ref 0.35–4.50)

## 2015-09-15 LAB — HEMOGLOBIN A1C: Hgb A1c MFr Bld: 6.4 % (ref 4.6–6.5)

## 2015-09-15 MED ORDER — NAPROXEN 500 MG PO TABS: 500.0000 mg | ORAL_TABLET | Freq: Two times a day (BID) | ORAL | Status: DC

## 2015-09-15 NOTE — Patient Instructions (Addendum)
You had the flu shot today  Your EKG was OK today  OK to drink more fluids over the next few days  There is no need to take the naproxen, if you are already taking the Nabumetone, as they are similar medications  Please continue all other medications as before, and refills have been done if requested.  Please have the pharmacy call with any other refills you may need.  Please continue your efforts at being more active, low cholesterol diet, and weight control.  You are otherwise up to date with prevention measures today.  Please keep your appointments with your specialists as you may have planned  Please go to the LAB in the Basement (turn left off the elevator) for the tests to be done today, including the Hep C test  You will be contacted by phone if any changes need to be made immediately.  Otherwise, you will receive a letter about your results with an explanation, but please check with MyChart first.  Please remember to sign up for MyChart if you have not done so, as this will be important to you in the future with finding out test results, communicating by private email, and scheduling acute appointments online when needed.  Please return in 1 year for your yearly visit, or sooner if needed, with Lab testing done 3-5 days before

## 2015-09-15 NOTE — Assessment & Plan Note (Signed)
stable overall by history and exam, recent data reviewed with pt, and pt to continue medical treatment as before,  to f/u any worsening symptoms or concerns Lab Results  Component Value Date   HGBA1C 6.2 09/25/2013   For f/u a1c

## 2015-09-15 NOTE — Progress Notes (Signed)
Subjective:    Patient ID: Charlann BoxerLinda P Hefel, female    DOB: 09-14-1956, 59 y.o.   MRN: [id number]  HPI  Here for wellness and f/u;  Overall doing ok;  Pt denies Chest pain, worsening SOB, DOE, wheezing, orthopnea, PND, worsening LE edema, palpitations, dizziness or syncope, but has not been drinking as much fluids revcently, has some dizziness and may be some orthostatic on exam.   Pt denies neurological change such as new headache, facial or extremity weakness.  Pt denies polydipsia, polyuria, or low sugar symptoms. Pt states overall good compliance with treatment and medications, good tolerability, and has been trying to follow appropriate diet.  Pt denies worsening depressive symptoms, suicidal ideation or panic. No fever, night sweats, wt loss, loss of appetite, or other constitutional symptoms.  Pt states good ability with ADL's, has low fall risk, home safety reviewed and adequate, no other significant changes in hearing or vision, and only occasionally active with exercise. Has bilat knee arthritis, on nabumtone per ortho/dr kramer.Has known right knee meniscal tear, has had cortisone injections, and if not working will eventually need surgury, but she has put if off, at least until early next yr Wt Readings from Last 3 Encounters:  09/15/15 228 lb (103.42 kg)  01/15/15 222 lb 0.6 oz (100.717 kg)  12/30/14 221 lb 1.3 oz (100.281 kg)   Past Medical History  Diagnosis Date  . ALLERGIC RHINITIS 09/11/2007  . BACK PAIN 11/14/2007  . EXTERNAL OTITIS 09/11/2007  . GERD 06/06/2007  . GOUT 11/14/2007  . HYPERTENSION 06/06/2007  . INTERMITTENT VERTIGO 09/11/2007  . Irritable bowel syndrome 06/06/2007  . LOW BACK PAIN 06/06/2007  . OSTEOARTHRITIS 06/06/2007  . SHOULDER PAIN, RIGHT 12/30/2008  . Hernia   . Anemia, unspecified 08/31/2012  . Other and unspecified hyperlipidemia 03/13/2013   Past Surgical History  Procedure Laterality Date  . Cesarean section  1987, 1991    x 2  . Cholecystectomy   1991  . Inguinal herniorrhapy      numerous hernia repairs 1996 to 2011  . Exploratory laparotomy      bowel obstruction  . S/p incarcerated hernia and ovary cyst jan 2011  2011    reports that she has never smoked. She has never used smokeless tobacco. She reports that she does not drink alcohol or use illicit drugs. family history includes Arthritis in her mother; Colon polyps (age of onset: 7268) in her father. There is no history of Colon cancer. No Known Allergies Current Outpatient Prescriptions on File Prior to Visit  Medication Sig Dispense Refill  . amLODipine (NORVASC) 10 MG tablet Take 1 tablet (10 mg total) by mouth daily. 90 tablet 0  . aspirin 81 MG tablet Take 81 mg by mouth daily.      . nabumetone (RELAFEN) 500 MG tablet Take 500 mg by mouth 2 (two) times daily.    . cholecalciferol (VITAMIN D) 1000 UNITS tablet Take 1,000 Units by mouth daily.    Marland Kitchen. dicyclomine (BENTYL) 10 MG capsule Take 1 capsule (10 mg total) by mouth 4 (four) times daily -  before meals and at bedtime. (Patient not taking: Reported on 09/15/2015) 60 capsule 1  . glucosamine-chondroitin 500-400 MG tablet Take 1 tablet by mouth 2 (two) times daily.    . irbesartan (AVAPRO) 300 MG tablet Take 1 tablet (300 mg total) by mouth daily. (Patient not taking: Reported on 09/15/2015) 90 tablet 0  . Magnesium Oxide 400 MG CAPS Take by mouth daily.    .Marland Kitchen  Multiple Vitamin (MULTIVITAMIN) tablet Take 1 tablet by mouth daily.    . nitroGLYCERIN (NITROSTAT) 0.3 MG SL tablet Place 1 tablet (0.3 mg total) under the tongue every 5 (five) minutes as needed for chest pain. 90 tablet 12  . tiZANidine (ZANAFLEX) 4 MG tablet Take 1 tablet (4 mg total) by mouth every 6 (six) hours as needed for muscle spasms. (Patient not taking: Reported on 09/15/2015) 40 tablet 1  . traMADol (ULTRAM) 50 MG tablet Take 1 tablet (50 mg total) by mouth every 6 (six) hours as needed. (Patient not taking: Reported on 09/15/2015) 120 tablet 2  . TURMERIC  PO Take by mouth 2 (two) times daily.     No current facility-administered medications on file prior to visit.   Review of Systems VS noted,  Constitutional: Pt is oriented to person, place, and time. Appears well-developed and well-nourished, in no significant distress Head: Normocephalic and atraumatic.  Right Ear: External ear normal.  Left Ear: External ear normal.  Nose: Nose normal.  Mouth/Throat: Oropharynx is clear and moist.  Eyes: Conjunctivae and EOM are normal. Pupils are equal, round, and reactive to light.  Neck: Normal range of motion. Neck supple. No JVD present. No tracheal deviation present or significant neck LA or mass Cardiovascular: Normal rate, regular rhythm, normal heart sounds and intact distal pulses.   Pulmonary/Chest: Effort normal and breath sounds without rales or wheezing  Abdominal: Soft. Bowel sounds are normal. NT. No HSM  Musculoskeletal: Normal range of motion. Exhibits no edema.  Lymphadenopathy:  Has no cervical adenopathy.  Neurological: Pt is alert and oriented to person, place, and time. Pt has normal reflexes. No cranial nerve deficit. Motor grossly intact Skin: Skin is warm and dry. No rash noted.  Psychiatric:  Has normal mood and affect. Behavior is normal.      Objective:   Physical Exam BP 148/98 mmHg  Pulse 76  Temp(Src) 97.7 F (36.5 C) (Oral)  Ht 5' (1.524 m)  Wt 228 lb (103.42 kg)  BMI 44.53 kg/m2  SpO2 98% VS noted,  Constitutional: Pt is oriented to person, place, and time. Appears well-developed and well-nourished, in no significant distress Head: Normocephalic and atraumatic.  Right Ear: External ear normal.  Left Ear: External ear normal.  Nose: Nose normal.  Mouth/Throat: Oropharynx is clear and moist.  Eyes: Conjunctivae and EOM are normal. Pupils are equal, round, and reactive to light.  Neck: Normal range of motion. Neck supple. No JVD present. No tracheal deviation present or significant neck LA or  mass Cardiovascular: Normal rate, regular rhythm, normal heart sounds and intact distal pulses.   Pulmonary/Chest: Effort normal and breath sounds without rales or wheezing  Abdominal: Soft. Bowel sounds are normal. NT. No HSM  Musculoskeletal: Normal range of motion. Exhibits no edema.  Lymphadenopathy:  Has no cervical adenopathy.  Neurological: Pt is alert and oriented to person, place, and time. Pt has normal reflexes. No cranial nerve deficit. Motor grossly intact Skin: Skin is warm and dry. No rash noted.  Psychiatric:  Has normal mood and affect. Behavior is normal.      Assessment & Plan:

## 2015-09-15 NOTE — Assessment & Plan Note (Signed)
stable overall by history and exam, recent data reviewed with pt, and pt to continue medical treatment as before,  to f/u any worsening symptoms or concerns Lab Results  Component Value Date   LDLCALC 95 09/25/2013

## 2015-09-15 NOTE — Assessment & Plan Note (Signed)
Mild elev today, likely situationa, o/w stable overall by history and exam, recent data reviewed with pt, and pt to continue medical treatment as before,  to f/u any worsening symptoms or concerns BP Readings from Last 3 Encounters:  09/15/15 148/98  01/15/15 128/88  12/30/14 132/88

## 2015-09-15 NOTE — Assessment & Plan Note (Signed)

## 2015-09-15 NOTE — Assessment & Plan Note (Signed)
?   Mild orthostatic today, etiology unclear, BP 's remain elevated, for increased po fluids over the next few days

## 2015-09-15 NOTE — Progress Notes (Signed)
Pre visit review using our clinic review tool, if applicable. No additional management support is needed unless otherwise documented below in the visit note. 

## 2015-09-16 LAB — HEPATITIS C ANTIBODY: HCV AB: NEGATIVE

## 2015-11-11 HISTORY — PX: KNEE ARTHROSCOPY: SUR90

## 2015-11-19 ENCOUNTER — Other Ambulatory Visit (HOSPITAL_COMMUNITY): Payer: Self-pay | Admitting: Orthopedic Surgery

## 2015-11-19 DIAGNOSIS — M79604 Pain in right leg: Secondary | ICD-10-CM

## 2015-11-20 ENCOUNTER — Ambulatory Visit (HOSPITAL_COMMUNITY)
Admission: RE | Admit: 2015-11-20 | Discharge: 2015-11-20 | Disposition: A | Discharge status: Home / Self Care | Payer: 59 | Source: Ambulatory Visit | Attending: Cardiovascular Disease | Admitting: Cardiovascular Disease

## 2015-11-20 DIAGNOSIS — M79604 Pain in right leg: Secondary | ICD-10-CM | POA: Diagnosis not present

## 2015-11-20 DIAGNOSIS — I10 Essential (primary) hypertension: Secondary | ICD-10-CM | POA: Insufficient documentation

## 2015-11-20 DIAGNOSIS — E785 Hyperlipidemia, unspecified: Secondary | ICD-10-CM | POA: Insufficient documentation

## 2016-01-05 ENCOUNTER — Encounter (HOSPITAL_COMMUNITY): Payer: Self-pay | Admitting: Emergency Medicine

## 2016-01-05 ENCOUNTER — Ambulatory Visit: Payer: 59 | Admitting: Internal Medicine

## 2016-01-05 ENCOUNTER — Emergency Department (HOSPITAL_COMMUNITY): Payer: 59

## 2016-01-05 ENCOUNTER — Emergency Department (HOSPITAL_COMMUNITY)
Admission: EM | Admit: 2016-01-05 | Discharge: 2016-01-05 | Disposition: A | Discharge status: Home / Self Care | Payer: 59 | Attending: Emergency Medicine | Admitting: Emergency Medicine

## 2016-01-05 DIAGNOSIS — M1990 Unspecified osteoarthritis, unspecified site: Secondary | ICD-10-CM | POA: Insufficient documentation

## 2016-01-05 DIAGNOSIS — K2970 Gastritis, unspecified, without bleeding: Secondary | ICD-10-CM | POA: Insufficient documentation

## 2016-01-05 DIAGNOSIS — Z9049 Acquired absence of other specified parts of digestive tract: Secondary | ICD-10-CM | POA: Diagnosis not present

## 2016-01-05 DIAGNOSIS — K5900 Constipation, unspecified: Secondary | ICD-10-CM | POA: Diagnosis not present

## 2016-01-05 DIAGNOSIS — Z79899 Other long term (current) drug therapy: Secondary | ICD-10-CM | POA: Insufficient documentation

## 2016-01-05 DIAGNOSIS — I10 Essential (primary) hypertension: Secondary | ICD-10-CM | POA: Diagnosis not present

## 2016-01-05 DIAGNOSIS — Z791 Long term (current) use of non-steroidal anti-inflammatories (NSAID): Secondary | ICD-10-CM | POA: Diagnosis not present

## 2016-01-05 DIAGNOSIS — Z7982 Long term (current) use of aspirin: Secondary | ICD-10-CM | POA: Diagnosis not present

## 2016-01-05 DIAGNOSIS — Z862 Personal history of diseases of the blood and blood-forming organs and certain disorders involving the immune mechanism: Secondary | ICD-10-CM | POA: Insufficient documentation

## 2016-01-05 DIAGNOSIS — Z8719 Personal history of other diseases of the digestive system: Secondary | ICD-10-CM | POA: Diagnosis not present

## 2016-01-05 DIAGNOSIS — Z8639 Personal history of other endocrine, nutritional and metabolic disease: Secondary | ICD-10-CM | POA: Insufficient documentation

## 2016-01-05 DIAGNOSIS — Z8669 Personal history of other diseases of the nervous system and sense organs: Secondary | ICD-10-CM | POA: Diagnosis not present

## 2016-01-05 DIAGNOSIS — R1013 Epigastric pain: Secondary | ICD-10-CM | POA: Diagnosis present

## 2016-01-05 LAB — CBC
HCT: 36.5 % (ref 36.0–46.0)
Hemoglobin: 11.7 g/dL — ABNORMAL LOW (ref 12.0–15.0)
MCH: 26.1 pg (ref 26.0–34.0)
MCHC: 32.1 g/dL (ref 30.0–36.0)
MCV: 81.5 fL (ref 78.0–100.0)
PLATELETS: 316 10*3/uL (ref 150–400)
RBC: 4.48 MIL/uL (ref 3.87–5.11)
RDW: 13 % (ref 11.5–15.5)
WBC: 5.9 10*3/uL (ref 4.0–10.5)

## 2016-01-05 LAB — COMPREHENSIVE METABOLIC PANEL
ALK PHOS: 117 U/L (ref 38–126)
ALT: 20 U/L (ref 14–54)
AST: 19 U/L (ref 15–41)
Albumin: 3.9 g/dL (ref 3.5–5.0)
Anion gap: 10 (ref 5–15)
BUN: 8 mg/dL (ref 6–20)
CALCIUM: 9.6 mg/dL (ref 8.9–10.3)
CO2: 22 mmol/L (ref 22–32)
CREATININE: 0.43 mg/dL — AB (ref 0.44–1.00)
Chloride: 107 mmol/L (ref 101–111)
GFR calc non Af Amer: >60 mL/min (ref >60)
GLUCOSE: 165 mg/dL — AB (ref 65–99)
Potassium: 3.9 mmol/L (ref 3.5–5.1)
SODIUM: 139 mmol/L (ref 135–145)
Total Bilirubin: 0.7 mg/dL (ref 0.3–1.2)
Total Protein: 9 g/dL — ABNORMAL HIGH (ref 6.5–8.1)

## 2016-01-05 LAB — LIPASE, BLOOD: Lipase: 76 U/L — ABNORMAL HIGH (ref 11–51)

## 2016-01-05 MED ORDER — ALBUTEROL SULFATE HFA 108 (90 BASE) MCG/ACT IN AERS: 2.0000 | INHALATION_SPRAY | RESPIRATORY_TRACT | Status: DC | PRN

## 2016-01-05 MED ORDER — BENZONATATE 100 MG PO CAPS: 100.0000 mg | ORAL_CAPSULE | Freq: Three times a day (TID) | ORAL | Status: DC

## 2016-01-05 MED ORDER — GI COCKTAIL ~~LOC~~
30.0000 mL | Freq: Once | ORAL | Status: AC
  Administered 2016-01-05: 30 mL via ORAL
  Filled 2016-01-05: qty 30

## 2016-01-05 MED ORDER — OMEPRAZOLE 20 MG PO CPDR: 20.0000 mg | DELAYED_RELEASE_CAPSULE | Freq: Every day | ORAL | Status: DC

## 2016-01-05 MED ORDER — DICYCLOMINE HCL 20 MG PO TABS: 20.0000 mg | ORAL_TABLET | Freq: Two times a day (BID) | ORAL | Status: DC

## 2016-01-05 MED ORDER — AZITHROMYCIN 250 MG PO TABS: 500.0000 mg | ORAL_TABLET | Freq: Once | ORAL | Status: DC

## 2016-01-05 MED ORDER — AZITHROMYCIN 250 MG PO TABS: 250.0000 mg | ORAL_TABLET | Freq: Every day | ORAL | Status: DC

## 2016-01-05 NOTE — ED Notes (Signed)
Pt states that she has been having gen abd pain radiating to her back x 1 week.  Intermittent constipation.  No NVD.

## 2016-01-05 NOTE — ED Provider Notes (Signed)
CSN: [id number]     Arrival date & time 01/05/16  0707 History   First MD Initiated Contact with Patient 01/05/16 1057     Chief Complaint  Patient presents with  . Abdominal Pain     (Consider location/radiation/quality/duration/timing/severity/associated sxs/prior Treatment) HPI Comments: Patient with a history of HTN, previous cholecystectomy (1991), previous bowel obstructions (last 2005) presents with one week of epigastric and bilateral upper abdominal pain. She describes the pain as waxing/waning, sometimes radiates to the back. It is worse with eating and better with belching or bowel movements. She reports constipation last week relieved with Magnesium Citrate. No fever. She has had mild nausea without vomiting, none now. She reports almost daily use of Naprosyn for knee pain. She also reports some mild relief with Bentyl which she had from a leftover prescription from last year when she had similar pain.   The history is provided by the patient. No language interpreter was used.    Past Medical History  Diagnosis Date  . ALLERGIC RHINITIS 09/11/2007  . BACK PAIN 11/14/2007  . EXTERNAL OTITIS 09/11/2007  . GERD 06/06/2007  . GOUT 11/14/2007  . HYPERTENSION 06/06/2007  . INTERMITTENT VERTIGO 09/11/2007  . Irritable bowel syndrome 06/06/2007  . LOW BACK PAIN 06/06/2007  . OSTEOARTHRITIS 06/06/2007  . SHOULDER PAIN, RIGHT 12/30/2008  . Hernia   . Anemia, unspecified 08/31/2012  . Other and unspecified hyperlipidemia 03/13/2013   Past Surgical History  Procedure Laterality Date  . Cesarean section  1987, 1991    x 2  . Cholecystectomy  1991  . Inguinal herniorrhapy      numerous hernia repairs 1996 to 2011  . Exploratory laparotomy      bowel obstruction  . S/p incarcerated hernia and ovary cyst jan 2011  2011   Family History  Problem Relation Age of Onset  . Arthritis Mother     RA  . Colon polyps Father 27  . Colon cancer Neg Hx    Social History  Substance Use  Topics  . Smoking status: Never Smoker   . Smokeless tobacco: Never Used  . Alcohol Use: No   OB History    No data available     Review of Systems  Constitutional: Negative for fever.  Respiratory: Negative for shortness of breath.   Cardiovascular: Negative for chest pain.  Gastrointestinal: Positive for nausea, abdominal pain and constipation. Negative for vomiting and diarrhea.  Genitourinary: Negative for dysuria and frequency.  Musculoskeletal: Negative for myalgias.      Allergies  Review of patient's allergies indicates no known allergies.  Home Medications   Prior to Admission medications   Medication Sig Start Date End Date Taking? Authorizing Provider  amLODipine (NORVASC) 10 MG tablet Take 1 tablet (10 mg total) by mouth daily. 09/14/15   [name], MD  aspirin 81 MG tablet Take 81 mg by mouth daily.      Historical Provider, MD  cholecalciferol (VITAMIN D) 1000 UNITS tablet Take 1,000 Units by mouth daily.    Historical Provider, MD  dicyclomine (BENTYL) 10 MG capsule Take 1 capsule (10 mg total) by mouth 4 (four) times daily -  before meals and at bedtime. Patient not taking: Reported on 09/15/2015 02/26/15   [name], MD  glucosamine-chondroitin 500-400 MG tablet Take 1 tablet by mouth 2 (two) times daily.    Historical Provider, MD  irbesartan (AVAPRO) 300 MG tablet Take 1 tablet (300 mg total) by mouth daily. Patient not taking: Reported on  09/15/2015 09/14/15   Corwin LevinsJames W John, MD  Magnesium Oxide 400 MG CAPS Take by mouth daily.    Historical Provider, MD  Multiple Vitamin (MULTIVITAMIN) tablet Take 1 tablet by mouth daily.    Historical Provider, MD  nabumetone (RELAFEN) 500 MG tablet Take 500 mg by mouth 2 (two) times daily.    Historical Provider, MD  nitroGLYCERIN (NITROSTAT) 0.3 MG SL tablet Place 1 tablet (0.3 mg total) under the tongue every 5 (five) minutes as needed for chest pain. 06/24/11 06/23/12  Corwin LevinsJames W John, MD  tiZANidine (ZANAFLEX) 4 MG tablet  Take 1 tablet (4 mg total) by mouth every 6 (six) hours as needed for muscle spasms. Patient not taking: Reported on 09/15/2015 01/15/15   Corwin LevinsJames W John, MD  traMADol (ULTRAM) 50 MG tablet Take 1 tablet (50 mg total) by mouth every 6 (six) hours as needed. Patient not taking: Reported on 09/15/2015 01/15/15   Corwin LevinsJames W John, MD  TURMERIC PO Take by mouth 2 (two) times daily.    Historical Provider, MD   BP 154/91 mmHg  Pulse 78  Temp(Src) 98.2 F (36.8 C) (Oral)  Resp 18  SpO2 100% Physical Exam  Constitutional: She is oriented to person, place, and time. She appears well-developed and well-nourished.  Neck: Normal range of motion.  Pulmonary/Chest: Effort normal. She exhibits no tenderness.  Abdominal: Soft. Bowel sounds are normal. There is tenderness (Epigastric and bilateral upper abdominal tenderness. ). There is no rebound and no guarding.  Musculoskeletal: Normal range of motion.  Neurological: She is alert and oriented to person, place, and time.  Skin: Skin is warm and dry.  Psychiatric: She has a normal mood and affect.    ED Course  Procedures (including critical care time) Labs Review Labs Reviewed  LIPASE, BLOOD - Abnormal; Notable for the following:    Lipase 76 (*)    All other components within normal limits  COMPREHENSIVE METABOLIC PANEL - Abnormal; Notable for the following:    Glucose, Bld 165 (*)    Creatinine, Ser 0.43 (*)    Total Protein 9.0 (*)    All other components within normal limits  CBC - Abnormal; Notable for the following:    Hemoglobin 11.7 (*)    All other components within normal limits    Imaging Review No results found. I have personally reviewed and evaluated these images and lab results as part of my medical decision-making.   EKG Interpretation None      MDM   Final diagnoses:  None    1. Abdominal pain  Patient with epigastric and upper abdominal pain for one week, off and on, worse with eating, better with  belching/bentyl/bowel movements. Take NSAIDs regularly. History of bowel obstructions.   DDx: Bowel obstruction - doubtful given one week of waxing waning upper abdominal pain, passing gas, having bowel movements, no fever, leukocytosis and +BS throughout. No evidence obstruction of plain x-ray of abdomen.  Gastritis: She has had gall bladder removed remotely (1991), is using NSAIDs daily, pain better with belching and Bentyl. Gastritis likely.  Constipation - moving bowels. Pending abdominal series.   The patient reports feeling some better with GI cocktail. VSS. Discussed the patient with Dr. Rubin PayorPickering. She is felt appropriate for discharge home with return precautions discussed. Discharge with prilosec, bentyl. Encouraged PCP follow up per already scheduled visit tomorrow.    Elpidio AnisShari Haidy Kackley, PA-C 01/07/16 [id number]  Benjiman CoreNathan Pickering, MD 01/08/16 [phone]

## 2016-01-05 NOTE — Discharge Instructions (Signed)

## 2016-01-12 ENCOUNTER — Encounter: Payer: Self-pay | Admitting: Internal Medicine

## 2016-01-12 ENCOUNTER — Ambulatory Visit (INDEPENDENT_AMBULATORY_CARE_PROVIDER_SITE_OTHER): Payer: 59 | Admitting: Internal Medicine

## 2016-01-12 ENCOUNTER — Other Ambulatory Visit (INDEPENDENT_AMBULATORY_CARE_PROVIDER_SITE_OTHER): Payer: 59

## 2016-01-12 VITALS — BP 130/72 | HR 99 | Temp 99.1°F | Resp 20 | Wt 223.0 lb

## 2016-01-12 DIAGNOSIS — Z0000 Encounter for general adult medical examination without abnormal findings: Secondary | ICD-10-CM | POA: Diagnosis not present

## 2016-01-12 DIAGNOSIS — R7302 Impaired glucose tolerance (oral): Secondary | ICD-10-CM

## 2016-01-12 DIAGNOSIS — R1010 Upper abdominal pain, unspecified: Secondary | ICD-10-CM

## 2016-01-12 DIAGNOSIS — I10 Essential (primary) hypertension: Secondary | ICD-10-CM

## 2016-01-12 LAB — BASIC METABOLIC PANEL
BUN: 6 mg/dL (ref 6–23)
CHLORIDE: 103 meq/L (ref 96–112)
CO2: 27 mEq/L (ref 19–32)
CREATININE: 0.54 mg/dL (ref 0.40–1.20)
Calcium: 9.8 mg/dL (ref 8.4–10.5)
GFR: 148.72 mL/min (ref >60.00)
Glucose, Bld: 116 mg/dL — ABNORMAL HIGH (ref 70–99)
POTASSIUM: 3.3 meq/L — AB (ref 3.5–5.1)
Sodium: 139 mEq/L (ref 135–145)

## 2016-01-12 LAB — LIPID PANEL
CHOL/HDL RATIO: 3
Cholesterol: 172 mg/dL (ref 0–200)
HDL: 61.5 mg/dL (ref >39.00)
LDL Cholesterol: 99 mg/dL (ref 0–99)
NONHDL: 110.12
Triglycerides: 55 mg/dL (ref 0.0–149.0)
VLDL: 11 mg/dL (ref 0.0–40.0)

## 2016-01-12 LAB — URINALYSIS, ROUTINE W REFLEX MICROSCOPIC
Bilirubin Urine: NEGATIVE
Hgb urine dipstick: NEGATIVE
Ketones, ur: NEGATIVE
Leukocytes, UA: NEGATIVE
Nitrite: NEGATIVE
SPECIFIC GRAVITY, URINE: 1.01 (ref 1.000–1.030)
TOTAL PROTEIN, URINE-UPE24: NEGATIVE
URINE GLUCOSE: NEGATIVE
Urobilinogen, UA: 0.2 (ref 0.0–1.0)
pH: 6 (ref 5.0–8.0)

## 2016-01-12 LAB — CBC WITH DIFFERENTIAL/PLATELET
BASOS PCT: 0.5 % (ref 0.0–3.0)
Basophils Absolute: 0 10*3/uL (ref 0.0–0.1)
EOS ABS: 0.2 10*3/uL (ref 0.0–0.7)
EOS PCT: 3.1 % (ref 0.0–5.0)
HEMATOCRIT: 34 % — AB (ref 36.0–46.0)
Hemoglobin: 11.1 g/dL — ABNORMAL LOW (ref 12.0–15.0)
LYMPHS PCT: 34 % (ref 12.0–46.0)
Lymphs Abs: 2 10*3/uL (ref 0.7–4.0)
MCHC: 32.8 g/dL (ref 30.0–36.0)
MCV: 78.6 fl (ref 78.0–100.0)
Monocytes Absolute: 0.5 10*3/uL (ref 0.1–1.0)
Monocytes Relative: 8.1 % (ref 3.0–12.0)
NEUTROS ABS: 3.2 10*3/uL (ref 1.4–7.7)
Neutrophils Relative %: 54.3 % (ref 43.0–77.0)
PLATELETS: 340 10*3/uL (ref 150.0–400.0)
RBC: 4.32 Mil/uL (ref 3.87–5.11)
RDW: 12.8 % (ref 11.5–15.5)
WBC: 5.9 10*3/uL (ref 4.0–10.5)

## 2016-01-12 LAB — HEMOGLOBIN A1C: Hgb A1c MFr Bld: 7.1 % — ABNORMAL HIGH (ref 4.6–6.5)

## 2016-01-12 LAB — HEPATIC FUNCTION PANEL
ALT: 14 U/L (ref 0–35)
AST: 18 U/L (ref 0–37)
Albumin: 4 g/dL (ref 3.5–5.2)
Alkaline Phosphatase: 109 U/L (ref 39–117)
BILIRUBIN DIRECT: 0 mg/dL (ref 0.0–0.3)
BILIRUBIN TOTAL: 0.5 mg/dL (ref 0.2–1.2)
Total Protein: 8.9 g/dL — ABNORMAL HIGH (ref 6.0–8.3)

## 2016-01-12 MED ORDER — PANTOPRAZOLE SODIUM 40 MG PO TBEC: 40.0000 mg | DELAYED_RELEASE_TABLET | Freq: Every day | ORAL | Status: DC

## 2016-01-12 MED ORDER — DICYCLOMINE HCL 20 MG PO TABS: 20.0000 mg | ORAL_TABLET | Freq: Three times a day (TID) | ORAL | Status: DC

## 2016-01-12 NOTE — Progress Notes (Signed)
Subjective:    Patient ID: Bianca BoxerLinda P Bernales, female    DOB: 02-06-1956, 60 y.o.   MRN: [id number]  HPI    Here with c/o mild to mod upper abd discomfort/spasms x 1 wk with nausea, dull, no vomiting, fever, wt loss, bowel change, dysphagia, reflux or other bowel change such as constipation or diarrhea, no distension but seems also crampy at times like a spasm, Prilosec and Bentyl helps but does not seem to last long enough.  Denies urinary symptoms such as dysuria, frequency, urgency, flank pain, hematuria or n/v, fever, chills, except for mild intermittent left flank pain..  Really bothers her b/c can wake her up at night.  Has had increased otc nsaid use as well, no change in ETOH.  Pt denies new neurological symptoms such as new headache, or facial or extremity weakness or numbness   Pt denies polydipsia, polyuria, Wt Readings from Last 3 Encounters:  01/12/16 223 lb (101.152 kg)  09/15/15 228 lb (103.42 kg)  01/15/15 222 lb 0.6 oz (100.717 kg)  Wt overall stable. Has hx of s/p ccx, bowel obstructions  Past Medical History  Diagnosis Date  . ALLERGIC RHINITIS 09/11/2007  . BACK PAIN 11/14/2007  . EXTERNAL OTITIS 09/11/2007  . GERD 06/06/2007  . GOUT 11/14/2007  . HYPERTENSION 06/06/2007  . INTERMITTENT VERTIGO 09/11/2007  . Irritable bowel syndrome 06/06/2007  . LOW BACK PAIN 06/06/2007  . OSTEOARTHRITIS 06/06/2007  . SHOULDER PAIN, RIGHT 12/30/2008  . Hernia   . Anemia, unspecified 08/31/2012  . Other and unspecified hyperlipidemia 03/13/2013   Past Surgical History  Procedure Laterality Date  . Cesarean section  1987, 1991    x 2  . Cholecystectomy  1991  . Inguinal herniorrhapy      numerous hernia repairs 1996 to 2011  . Exploratory laparotomy      bowel obstruction  . S/p incarcerated hernia and ovary cyst jan 2011  2011    reports that she has never smoked. She has never used smokeless tobacco. She reports that she does not drink alcohol or use illicit drugs. family history  includes Arthritis in her mother; Colon polyps (age of onset: 6668) in her father. There is no history of Colon cancer. No Known Allergies Current Outpatient Prescriptions on File Prior to Visit  Medication Sig Dispense Refill  . acetaminophen (TYLENOL) 500 MG tablet Take 1,000 mg by mouth every 6 (six) hours as needed for moderate pain or headache.    Marland Kitchen. amLODipine (NORVASC) 10 MG tablet Take 1 tablet (10 mg total) by mouth daily. 90 tablet 0  . Ascorbic Acid (VITAMIN C PO) Take 1 tablet by mouth daily.    Marland Kitchen. azithromycin (ZITHROMAX Z-PAK) 250 MG tablet Take 1 tablet (250 mg total) by mouth daily. 4 tablet 0  . benzonatate (TESSALON) 100 MG capsule Take 1 capsule (100 mg total) by mouth every 8 (eight) hours. 21 capsule 0  . Cholecalciferol (VITAMIN D3) 3000 units TABS Take 3,000 Units by mouth daily.    . irbesartan (AVAPRO) 300 MG tablet Take 1 tablet (300 mg total) by mouth daily. 90 tablet 0  . Multiple Vitamin (MULTIVITAMIN) tablet Take 1 tablet by mouth daily.    . Omega-3 Fatty Acids (FISH OIL PO) Take 1 capsule by mouth 2 (two) times daily.    Marland Kitchen. omeprazole (PRILOSEC) 20 MG capsule Take 1 capsule (20 mg total) by mouth daily. 30 capsule 0   No current facility-administered medications on file prior to visit.   Review  of Systems  Constitutional: Negative for unusual diaphoresis or night sweats HENT: Negative for ear swelling or discharge Eyes: Negative for worsening visual haziness  Respiratory: Negative for choking and stridor.   Gastrointestinal: Negative for distension or worsening eructation Genitourinary: Negative for retention or change in urine volume.  Musculoskeletal: Negative for other MSK pain or swelling Skin: Negative for color change and worsening wound Neurological: Negative for tremors and numbness other than noted  Psychiatric/Behavioral: Negative for decreased concentration or agitation other than above       Objective:   Physical Exam BP 130/72 mmHg  Pulse 99   Temp(Src) 99.1 F (37.3 C) (Oral)  Resp 20  Wt 223 lb (101.152 kg)  SpO2 99% VS noted,  Constitutional: Pt appears in no apparent distress HENT: Head: NCAT.  Right Ear: External ear normal.  Left Ear: External ear normal.  Eyes: . Pupils are equal, round, and reactive to light. Conjunctivae and EOM are normal Neck: Normal range of motion. Neck supple.  Cardiovascular: Normal rate and regular rhythm.   Pulmonary/Chest: Effort normal and breath sounds without rales or wheezing.  Abd:  Soft, NT, ND, + BS except mild epigastric tenderness Neurological: Pt is alert. Not confused , motor grossly intact Skin: Skin is warm. No rash, no LE edema Psychiatric: Pt behavior is normal. No agitation.     Assessment & Plan:

## 2016-01-12 NOTE — Patient Instructions (Signed)
OK to increase the spasm medication (dicyclomine) to three times per day as needed  OK to change the prilosec (omeprazole) to protonix 40 mg per day  Please call in 1 week for GI referral if still having pain  Please continue all other medications as before, and refills have been done if requested.  Please have the pharmacy call with any other refills you may need.  Please keep your appointments with your specialists as you may have planned  Please go to the LAB in the Basement (turn left off the elevator) for the tests to be done today - just the urine testing today  You will be contacted by phone if any changes need to be made immediately.  Otherwise, you will receive a letter about your results with an explanation, but please check with MyChart first.  Please remember to sign up for MyChart if you have not done so, as this will be important to you in the future with finding out test results, communicating by private email, and scheduling acute appointments online when needed.

## 2016-01-12 NOTE — Progress Notes (Signed)
Pre visit review using our clinic review tool, if applicable. No additional management support is needed unless otherwise documented below in the visit note. 

## 2016-01-13 ENCOUNTER — Encounter: Payer: Self-pay | Admitting: Internal Medicine

## 2016-01-13 LAB — TSH: TSH: 1.24 u[IU]/mL (ref 0.35–4.50)

## 2016-01-15 DIAGNOSIS — R109 Unspecified abdominal pain: Secondary | ICD-10-CM | POA: Insufficient documentation

## 2016-01-15 NOTE — Assessment & Plan Note (Signed)
stable overall by history and exam, recent data reviewed with pt, and pt to continue medical treatment as before,  to f/u any worsening symptoms or concerns Lab Results  Component Value Date   HGBA1C 7.1* 01/12/2016

## 2016-01-15 NOTE — Assessment & Plan Note (Signed)
Mild, afeb, without vomiting, blood or bowel change, suspect IBS related vs peptic, for UA today, but also change PPI to protonix daily, and bentyl incr to tid prn,  to f/u any worsening symptoms or concerns

## 2016-01-15 NOTE — Assessment & Plan Note (Signed)
stable overall by history and exam, recent data reviewed with pt, and pt to continue medical treatment as before,  to f/u any worsening symptoms or concerns BP Readings from Last 3 Encounters:  01/12/16 130/72  01/05/16 155/80  09/15/15 148/98

## 2016-01-20 ENCOUNTER — Telehealth: Payer: Self-pay | Admitting: Internal Medicine

## 2016-01-20 DIAGNOSIS — R1010 Upper abdominal pain, unspecified: Secondary | ICD-10-CM

## 2016-01-20 NOTE — Telephone Encounter (Signed)
Pt called in and not feel any better would like referral to GI ASAP

## 2016-01-20 NOTE — Telephone Encounter (Signed)
Please advise, can we do this 

## 2016-01-20 NOTE — Telephone Encounter (Signed)
Ok, this has been done 

## 2016-03-23 ENCOUNTER — Encounter: Payer: Self-pay | Admitting: Internal Medicine

## 2016-03-23 ENCOUNTER — Ambulatory Visit (INDEPENDENT_AMBULATORY_CARE_PROVIDER_SITE_OTHER): Payer: 59 | Admitting: Internal Medicine

## 2016-03-23 VITALS — BP 152/90 | HR 80 | Ht 60.0 in | Wt 210.6 lb

## 2016-03-23 DIAGNOSIS — D649 Anemia, unspecified: Secondary | ICD-10-CM | POA: Diagnosis not present

## 2016-03-23 DIAGNOSIS — R1013 Epigastric pain: Secondary | ICD-10-CM

## 2016-03-23 DIAGNOSIS — R6881 Early satiety: Secondary | ICD-10-CM

## 2016-03-23 NOTE — Patient Instructions (Signed)
You have been scheduled for an endoscopy. Please follow written instructions given to you at your visit today. If you use inhalers (even only as needed), please bring them with you on the day of your procedure. Your physician has requested that you go to www.startemmi.com and enter the access code given to you at your visit today. This web site gives a general overview about your procedure. However, you should still follow specific instructions given to you by our office regarding your preparation for the procedure.  If you are age 965 or older, your body mass index should be between 23-30. Your Body mass index is 41.13 kg/(m^2). If this is out of the aforementioned range listed, please consider follow up with your Primary Care Provider.  If you are age 59 or younger, your body mass index should be between 19-25. Your Body mass index is 41.13 kg/(m^2). If this is out of the aformentioned range listed, please consider follow up with your Primary Care Provider.

## 2016-03-23 NOTE — Progress Notes (Signed)
Patient ID: AMONI SCALLAN, female   DOB: July 15, 1956, 60 y.o.   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 Jonny Ruiz to evaluate epigastric abdominal pain. She is here alone today. She is known to me from a screening colonoscopy performed in February 2015 which was normal. She reports that in April she was diagnosed with gastritis. She's had soreness in her epigastrium as well as early fullness and early satiety. She also has felt somewhat bloated. She denies heartburn or regurgitation. No dysphagia. Pain does not change with eating, though the fullness does. No nausea or vomiting. She states that in taking a deep breath the soreness in her upper abdomen worsens. She was previous to taking omeprazole 20 mg daily for reflux but this was switched pantoprazole 40 mg daily by Dr. Jonny Ruiz. She is now taking this on a more as-needed basis. She tried Bentyl but this made her woozy and she's been using it as needed at bedtime. She reports she occasionally gets constipated but treats this with drinking vinegar. No blood in her stool or melena  Review of labs indicates she has developed a mild anemia with borderline low MCV  Past Medical History  Diagnosis Date  . ALLERGIC RHINITIS 09/11/2007  . BACK PAIN 11/14/2007  . EXTERNAL OTITIS 09/11/2007  . GERD 06/06/2007  . GOUT 11/14/2007  . HYPERTENSION 06/06/2007  . INTERMITTENT VERTIGO 09/11/2007  . Irritable bowel syndrome 06/06/2007  . LOW BACK PAIN 06/06/2007  . OSTEOARTHRITIS 06/06/2007  . SHOULDER PAIN, RIGHT 12/30/2008  . Hernia   . Anemia, unspecified 08/31/2012  . Other and unspecified hyperlipidemia 03/13/2013    Past Surgical History  Procedure Laterality Date  . Cesarean section  1987, 1991    x 2  . Cholecystectomy  1991  . Inguinal herniorrhapy       numerous hernia repairs 1996 to 2011  . Exploratory laparotomy      bowel obstruction  . S/p incarcerated hernia and ovary cyst jan 2011  2011  . Knee arthroscopy  11/11/2015    Outpatient Prescriptions Prior to Visit  Medication Sig Dispense Refill  . acetaminophen (TYLENOL) 500 MG tablet Take 1,000 mg by mouth every 6 (six) hours as needed for moderate pain or headache.    Marland Kitchen amLODipine (NORVASC) 10 MG tablet Take 1 tablet (10 mg total) by mouth daily. 90 tablet 0  . Ascorbic Acid (VITAMIN C PO) Take 1 tablet by mouth daily.    . benzonatate (TESSALON) 100 MG capsule Take 1 capsule (100 mg total) by mouth every 8 (eight) hours. 21 capsule 0  . Cholecalciferol (VITAMIN D3) 3000 units TABS Take 3,000 Units by mouth daily.    Marland Kitchen dicyclomine (BENTYL) 20 MG tablet Take 1 tablet (20 mg total) by mouth 3 (three) times daily before meals. (Patient taking differently: Take 20 mg by mouth as needed. ) 50 tablet 1  . irbesartan (AVAPRO) 300 MG tablet Take 1 tablet (300 mg total) by mouth daily. 90 tablet 0  . Multiple Vitamin (MULTIVITAMIN) tablet Take 1 tablet by mouth daily.    . Omega-3 Fatty Acids (FISH OIL PO) Take 1 capsule by mouth 2 (two) times daily.    Marland Kitchen omeprazole (PRILOSEC) 20 MG capsule Take 1 capsule (20 mg total) by mouth daily. (Patient taking differently: Take 20 mg by mouth daily as  needed. ) 30 capsule 0  . pantoprazole (PROTONIX) 40 MG tablet Take 1 tablet (40 mg total) by mouth daily. (Patient taking differently: Take 40 mg by mouth daily as needed. ) 90 tablet 3  . azithromycin (ZITHROMAX Z-PAK) 250 MG tablet Take 1 tablet (250 mg total) by mouth daily. 4 tablet 0   No facility-administered medications prior to visit.    No Known Allergies  Family History  Problem Relation Age of Onset  . Arthritis Mother     RA  . Colon polyps Father 3868  . Colon cancer Neg Hx     Social History  Substance Use Topics  . Smoking status: Never Smoker   . Smokeless tobacco: Never Used  .  Alcohol Use: No    ROS: As per history of present illness, otherwise negative  BP 152/90 mmHg  Pulse 80  Ht 5' (1.524 m)  Wt 210 lb 9.6 oz (95.528 kg)  BMI 41.13 kg/m2 Constitutional: Well-developed and well-nourished. No distress. HEENT: Normocephalic and atraumatic. Oropharynx is clear and moist. No oropharyngeal exudate. Conjunctivae are normal.  No scleral icterus. Neck: Neck supple. Trachea midline. Cardiovascular: Normal rate, regular rhythm and intact distal pulses. No M/R/G Pulmonary/chest: Effort normal and breath sounds normal. No wheezing, rales or rhonchi. Abdominal: Soft, Epigastric tenderness without rebound or guarding, nondistended. Bowel sounds active throughout.  Extremities: no clubbing, cyanosis, or edema Lymphadenopathy: No cervical adenopathy noted. Neurological: Alert and oriented to person place and time. Skin: Skin is warm and dry. No rashes noted. Psychiatric: Normal mood and affect. Behavior is normal.  RELEVANT LABS AND IMAGING: CBC    Component Value Date/Time   WBC 5.9 01/12/2016 1716   RBC 4.32 01/12/2016 1716   HGB 11.1* 01/12/2016 1716   HCT 34.0* 01/12/2016 1716   PLT 340.0 01/12/2016 1716   MCV 78.6 01/12/2016 1716   MCH 26.1 01/05/2016 0742   MCHC 32.8 01/12/2016 1716   RDW 12.8 01/12/2016 1716   LYMPHSABS 2.0 01/12/2016 1716   MONOABS 0.5 01/12/2016 1716   EOSABS 0.2 01/12/2016 1716   BASOSABS 0.0 01/12/2016 1716    CMP     Component Value Date/Time   NA 139 01/12/2016 1716   K 3.3* 01/12/2016 1716   CL 103 01/12/2016 1716   CO2 27 01/12/2016 1716   GLUCOSE 116* 01/12/2016 1716   BUN 6 01/12/2016 1716   CREATININE 0.54 01/12/2016 1716   CALCIUM 9.8 01/12/2016 1716   CALCIUM 9.7 06/24/2011 0952   PROT 8.9* 01/12/2016 1716   ALBUMIN 4.0 01/12/2016 1716   AST 18 01/12/2016 1716   ALT 14 01/12/2016 1716   ALKPHOS 109 01/12/2016 1716   BILITOT 0.5 01/12/2016 1716   GFRNONAA >60 01/05/2016 0742   GFRAA >60 01/05/2016 0742     ASSESSMENT/PLAN: 60 year old female with a past medical history of hypertension, abdominal hernia status post repair, 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 Jonny RuizJohn to evaluate epigastric abdominal pain.   1. Epigastric pain -- given epigastric pain, early satiety and mild anemia I recommended we evaluate more fully with upper endoscopy. We discussed the risk benefits and alternatives and she is agreeable to proceed. Repeat CBC and iron studies. For now she can continue pantoprazole 40 mg daily until the time of endoscopy, further recommendations thereafter    WG:NFAOZCc:James Ellin MayhewW John, Md [address] [place] [id number]

## 2016-03-24 ENCOUNTER — Telehealth: Payer: Self-pay | Admitting: *Deleted

## 2016-03-24 DIAGNOSIS — R1013 Epigastric pain: Secondary | ICD-10-CM

## 2016-03-24 NOTE — Telephone Encounter (Signed)
-----   Message from Beverley FiedlerJay M Pyrtle, MD sent at 03/23/2016  4:29 PM EDT ----- Needs to return for cbc and iron studies

## 2016-03-24 NOTE — Telephone Encounter (Signed)
Left voicemail for patient to call back. 

## 2016-03-28 ENCOUNTER — Other Ambulatory Visit (INDEPENDENT_AMBULATORY_CARE_PROVIDER_SITE_OTHER): Payer: 59

## 2016-03-28 DIAGNOSIS — R1013 Epigastric pain: Secondary | ICD-10-CM

## 2016-03-28 LAB — CBC WITH DIFFERENTIAL/PLATELET
BASOS PCT: 0.2 % (ref 0.0–3.0)
Basophils Absolute: 0 10*3/uL (ref 0.0–0.1)
EOS PCT: 0.8 % (ref 0.0–5.0)
Eosinophils Absolute: 0.1 10*3/uL (ref 0.0–0.7)
HCT: 36.6 % (ref 36.0–46.0)
Hemoglobin: 11.9 g/dL — ABNORMAL LOW (ref 12.0–15.0)
LYMPHS ABS: 3 10*3/uL (ref 0.7–4.0)
Lymphocytes Relative: 21.1 % (ref 12.0–46.0)
MCHC: 32.4 g/dL (ref 30.0–36.0)
MCV: 75.9 fl — ABNORMAL LOW (ref 78.0–100.0)
MONO ABS: 0.8 10*3/uL (ref 0.1–1.0)
Monocytes Relative: 5.3 % (ref 3.0–12.0)
Neutro Abs: 10.4 10*3/uL — ABNORMAL HIGH (ref 1.4–7.7)
Neutrophils Relative %: 72.6 % (ref 43.0–77.0)
PLATELETS: 312 10*3/uL (ref 150.0–400.0)
RBC: 4.83 Mil/uL (ref 3.87–5.11)
RDW: 18.6 % — AB (ref 11.5–15.5)
WBC: 14.3 10*3/uL — ABNORMAL HIGH (ref 4.0–10.5)

## 2016-03-28 LAB — IBC PANEL
IRON: 72 ug/dL (ref 42–145)
Saturation Ratios: 17.7 % — ABNORMAL LOW (ref 20.0–50.0)
Transferrin: 290 mg/dL (ref 212.0–360.0)

## 2016-03-28 NOTE — Telephone Encounter (Signed)
Patient advised to have CBC and iron studies completed this week as recommended by Dr Rhea BeltonPyrtle recently. She verbalizes understanding and states that she will come this afternoon for labs.

## 2016-03-29 LAB — FERRITIN: Ferritin: 80.4 ng/mL (ref 10.0–291.0)

## 2016-04-04 ENCOUNTER — Encounter: Payer: Self-pay | Admitting: Internal Medicine

## 2016-04-08 ENCOUNTER — Other Ambulatory Visit: Payer: Self-pay | Admitting: Internal Medicine

## 2016-04-12 ENCOUNTER — Encounter: Payer: Self-pay | Admitting: Internal Medicine

## 2016-04-12 ENCOUNTER — Ambulatory Visit (AMBULATORY_SURGERY_CENTER): Payer: 59 | Admitting: Internal Medicine

## 2016-04-12 VITALS — BP 114/67 | HR 90 | Temp 98.7°F | Resp 22 | Ht 60.0 in | Wt 210.0 lb

## 2016-04-12 DIAGNOSIS — R1013 Epigastric pain: Secondary | ICD-10-CM

## 2016-04-12 MED ORDER — SODIUM CHLORIDE 0.9 % IV SOLN: 500.0000 mL | INTRAVENOUS | Status: DC

## 2016-04-12 NOTE — Progress Notes (Signed)
Report to PACU, RN, vss, BBS= Clear.  

## 2016-04-12 NOTE — Patient Instructions (Signed)
YOU HAD AN ENDOSCOPIC PROCEDURE TODAY AT THE [facility]:   Refer to the procedure report that was given to you for any specific questions about what was found during the examination.  If the procedure report does not answer your questions, please call your gastroenterologist to clarify.  If you requested that your care partner not be given the details of your procedure findings, then the procedure report has been included in a sealed envelope for you to review at your convenience later.  YOU SHOULD EXPECT: Some feelings of bloating in the abdomen. Passage of more gas than usual.  Walking can help get rid of the air that was put into your GI tract during the procedure and reduce the bloating. If you had a lower endoscopy (such as a colonoscopy or flexible sigmoidoscopy) you may notice spotting of blood in your stool or on the toilet paper. If you underwent a bowel prep for your procedure, you may not have a normal bowel movement for a few days.  Please Note:  You might notice some irritation and congestion in your nose or some drainage.  This is from the oxygen used during your procedure.  There is no need for concern and it should clear up in a day or so.  SYMPTOMS TO REPORT IMMEDIATELY:    Following upper endoscopy (EGD)  Vomiting of blood or coffee ground material  New chest pain or pain under the shoulder blades  Painful or persistently difficult swallowing  New shortness of breath  Fever of 100F or higher  Black, tarry-looking stools  For urgent or emergent issues, a gastroenterologist can be reached at any hour by calling [phone].   DIET: Your first meal following the procedure should be a small meal and then it is ok to progress to your normal diet. Heavy or fried foods are harder to digest and may make you feel nauseous or bloated.  Likewise, meals heavy in dairy and vegetables can increase bloating.  Drink plenty of fluids but you should avoid alcoholic beverages  for 24 hours.  ACTIVITY:  You should plan to take it easy for the rest of today and you should NOT DRIVE or use heavy machinery until tomorrow (because of the sedation medicines used during the test).    FOLLOW UP: Our staff will call the number listed on your records the next business day following your procedure to check on you and address any questions or concerns that you may have regarding the information given to you following your procedure. If we do not reach you, we will leave a message.  However, if you are feeling well and you are not experiencing any problems, there is no need to return our call.  We will assume that you have returned to your regular daily activities without incident.  If any biopsies were taken you will be contacted by phone or by letter within the next 1-3 weeks.  Please call us at [phone] if you have not heard about the biopsies in 3 weeks.    SIGNATURES/CONFIDENTIALITY: You and/or your care partner have signed paperwork which will be entered into your electronic medical record.  These signatures attest to the fact that that the information above on your After Visit Summary has been reviewed and is understood.  Full responsibility of the confidentiality of this discharge information lies with you and/or your care-partner.  Await pathology results. Hiatal hernia handout provided.

## 2016-04-12 NOTE — Op Note (Signed)
[facility] Patient Name: Bianca Campbell Procedure Date: 04/12/2016 3:32 PM MRN: [id number] Endoscopist: Beverley FiedlerJay M Najia Hurlbutt , MD Age: [age] Referring MD:  Date of Birth: 1956/09/25 Gender: Female Account #: [id number] Procedure:                Upper GI endoscopy Indications:              Epigastric abdominal pain Medicines:                Monitored Anesthesia Care Procedure:                Pre-Anesthesia Assessment:                           - Prior to the procedure, a History and Physical                            was performed, and patient medications and                            allergies were reviewed. The patient's tolerance of                            previous anesthesia was also reviewed. The risks                            and benefits of the procedure and the sedation                            options and risks were discussed with the patient.                            All questions were answered, and informed consent                            was obtained. Prior Anticoagulants: The patient has                            taken no previous anticoagulant or antiplatelet                            agents. ASA Grade Assessment: III - A patient with                            severe systemic disease. After reviewing the risks                            and benefits, the patient was deemed in                            satisfactory condition to undergo the procedure.                           After obtaining informed consent, the endoscope was  passed under direct vision. Throughout the                            procedure, the patient's blood pressure, pulse, and                            oxygen saturations were monitored continuously. The                            Model GIF-[id number]) scope was introduced                            through the mouth, and advanced to the third part                            of duodenum. The upper GI  endoscopy was                            accomplished without difficulty. The patient                            tolerated the procedure well. Scope In: Scope Out: Findings:                 Normal mucosa was found in the entire esophagus.                           A widely patent Schatzki ring (acquired) was found                            at the gastroesophageal junction.                           Retained fluid was found in the gastric body. Fluid                            aspiration for cytology was performed.                           A small hiatal hernia was present.                           Normal mucosa was found in the entire examined                            stomach. Biopsies were taken with a cold forceps                            for histology and Helicobacter pylori testing.                           The examined duodenum was normal. Complications:            No immediate complications. Estimated Blood Loss:     Estimated blood loss was minimal. Impression:               -  Normal mucosa was found in the entire esophagus.                           - Widely patent Schatzki ring.                           - Retained gastric fluid raising possibility of                            gastroparesis. Fluid aspiration performed.                           - Small hiatal hernia.                           - Normal mucosa was found in the entire stomach.                            Biopsied.                           - Normal examined duodenum. Recommendation:           - Patient has a contact number available for                            emergencies. The signs and symptoms of potential                            delayed complications were discussed with the                            patient. Return to normal activities tomorrow.                            Written discharge instructions were provided to the                            patient.                           - Resume  previous diet.                           - Continue present medications.                           - Await pathology results.                           - If biopsy results unremarkable and persistent                            symptoms would proceed to gastric emptying study to                            evaluate for gastroparesis. Beverley Fiedler, MD  04/12/2016 4:00:04 PM This report has been signed electronically.

## 2016-04-12 NOTE — Progress Notes (Signed)
Called to room to assist during endoscopic procedure.  Patient ID and intended procedure confirmed with present staff. Received instructions for my participation in the procedure from the performing physician.  

## 2016-04-13 ENCOUNTER — Telehealth: Payer: Self-pay

## 2016-04-13 NOTE — Telephone Encounter (Signed)
  Follow up Call-  Call back number 04/12/2016 12/06/2013  Post procedure Call Back phone  # [phone]  Permission to leave phone message Yes Yes     Patient questions:  Do you have a fever, pain , or abdominal swelling? No. Pain Score  0 *  Have you tolerated food without any problems? Yes.    Have you been able to return to your normal activities? Yes.    Do you have any questions about your discharge instructions: Diet   No. Medications  No. Follow up visit  No.  Do you have questions or concerns about your Care? No.  Actions: * If pain score is 4 or above: No action needed, pain <4.

## 2016-04-19 ENCOUNTER — Encounter: Payer: Self-pay | Admitting: Internal Medicine

## 2016-07-14 ENCOUNTER — Other Ambulatory Visit: Payer: Self-pay | Admitting: Internal Medicine

## 2016-08-26 ENCOUNTER — Ambulatory Visit (INDEPENDENT_AMBULATORY_CARE_PROVIDER_SITE_OTHER): Payer: 59 | Admitting: Internal Medicine

## 2016-08-26 ENCOUNTER — Ambulatory Visit (INDEPENDENT_AMBULATORY_CARE_PROVIDER_SITE_OTHER)
Admission: RE | Admit: 2016-08-26 | Discharge: 2016-08-26 | Disposition: A | Discharge status: Home / Self Care | Payer: 59 | Source: Ambulatory Visit | Attending: Internal Medicine | Admitting: Internal Medicine

## 2016-08-26 ENCOUNTER — Encounter: Payer: Self-pay | Admitting: Internal Medicine

## 2016-08-26 VITALS — BP 140/82 | HR 100 | Resp 20 | Wt 220.0 lb

## 2016-08-26 DIAGNOSIS — M545 Low back pain, unspecified: Secondary | ICD-10-CM

## 2016-08-26 DIAGNOSIS — G8929 Other chronic pain: Secondary | ICD-10-CM

## 2016-08-26 DIAGNOSIS — M25561 Pain in right knee: Secondary | ICD-10-CM | POA: Diagnosis not present

## 2016-08-26 DIAGNOSIS — I10 Essential (primary) hypertension: Secondary | ICD-10-CM

## 2016-08-26 MED ORDER — TRAMADOL HCL 50 MG PO TABS: 50.0000 mg | ORAL_TABLET | Freq: Four times a day (QID) | ORAL | 1 refills | Status: DC | PRN

## 2016-08-26 MED ORDER — KETOROLAC TROMETHAMINE 60 MG/2ML IM SOLN
60.0000 mg | Freq: Once | INTRAMUSCULAR | Status: AC
  Administered 2016-08-26: 30 mg via INTRAMUSCULAR

## 2016-08-26 MED ORDER — CYCLOBENZAPRINE HCL 5 MG PO TABS: 5.0000 mg | ORAL_TABLET | Freq: Three times a day (TID) | ORAL | 1 refills | Status: DC | PRN

## 2016-08-26 NOTE — Progress Notes (Signed)
Subjective:    Patient ID: Bianca Campbell, female    DOB: [birth year], 60 y.o.   MRN: [id number]  HPI  Here with acute onset LBP  3 days, mod to severe, constant, no radiation, worse this am for some reason, did have a fall on Mon when a chair slipped out from under her and she fell to sacral area but no other trauma.  Also with chronic right knee pain, limps fairly often to walk and throws back off.  Pt denies bowel or bladder change, fever, wt loss,  worsening LE pain/numbness/weakness. Denies urinary symptoms such as dysuria, frequency, urgency, flank pain, hematuria or n/v, fever, chills. Denies worsening reflux, abd pain, dysphagia, n/v, bowel change or blood. Past Medical History:  Diagnosis Date  . ALLERGIC RHINITIS 09/11/2007  . Anemia, unspecified 08/31/2012  . BACK PAIN 11/14/2007  . EXTERNAL OTITIS 09/11/2007  . GERD 06/06/2007  . GOUT 11/14/2007  . Hernia   . HYPERTENSION 06/06/2007  . INTERMITTENT VERTIGO 09/11/2007  . Irritable bowel syndrome 06/06/2007  . LOW BACK PAIN 06/06/2007  . OSTEOARTHRITIS 06/06/2007  . Other and unspecified hyperlipidemia 03/13/2013  . SHOULDER PAIN, RIGHT 12/30/2008   Past Surgical History:  Procedure Laterality Date  . CESAREAN SECTION  1987, 1991   x 2  . CHOLECYSTECTOMY  1991  . COLONOSCOPY     pt stated 2015 or 2016 unknown md  . EXPLORATORY LAPAROTOMY     bowel obstruction  . inguinal herniorrhapy     numerous hernia repairs 1996 to 2011  . KNEE ARTHROSCOPY  11/11/2015  . s/p incarcerated hernia and ovary cyst Jan 2011  2011    reports that she has never smoked. She has never used smokeless tobacco. She reports that she does not drink alcohol or use drugs. family history includes Arthritis in her mother; Colon polyps (age of onset: 4168) in her father. No Known Allergies Current Outpatient Prescriptions on File Prior to Visit  Medication Sig Dispense Refill  . acetaminophen (TYLENOL) 500 MG tablet Take 1,000 mg by mouth every 6 (six)  hours as needed for moderate pain or headache.    Marland Kitchen. amLODipine (NORVASC) 10 MG tablet TAKE ONE TABLET BY MOUTH ONCE DAILY 90 tablet 1  . Ascorbic Acid (VITAMIN C PO) Take 1 tablet by mouth daily.    . benzonatate (TESSALON) 100 MG capsule Take 1 capsule (100 mg total) by mouth every 8 (eight) hours. 21 capsule 0  . Cholecalciferol (VITAMIN D3) 3000 units TABS Take 3,000 Units by mouth daily.    Marland Kitchen. dicyclomine (BENTYL) 20 MG tablet TAKE ONE TABLET BY MOUTH THREE TIMES DAILY BEFORE MEAL(S) 50 tablet 1  . irbesartan (AVAPRO) 300 MG tablet TAKE ONE TABLET BY MOUTH ONCE DAILY 90 tablet 1  . Multiple Vitamin (MULTIVITAMIN) tablet Take 1 tablet by mouth daily.    . Omega-3 Fatty Acids (FISH OIL PO) Take 1 capsule by mouth 2 (two) times daily.    Marland Kitchen. omeprazole (PRILOSEC) 20 MG capsule Take 1 capsule (20 mg total) by mouth daily. 30 capsule 0  . pantoprazole (PROTONIX) 40 MG tablet Take 1 tablet (40 mg total) by mouth daily. 90 tablet 3  . predniSONE (STERAPRED UNI-PAK 48 TAB) 10 MG (48) TBPK tablet Reported on 04/12/2016     No current facility-administered medications on file prior to visit.    Review of Systems  Constitutional: Negative for unusual diaphoresis or night sweats HENT: Negative for ear swelling or discharge Eyes: Negative for worsening visual haziness  Respiratory: Negative for choking and stridor.   Gastrointestinal: Negative for distension or worsening eructation Genitourinary: Negative for retention or change in urine volume.  Musculoskeletal: Negative for other MSK pain or swelling Skin: Negative for color change and worsening wound Neurological: Negative for tremors and numbness other than noted  Psychiatric/Behavioral: Negative for decreased concentration or agitation other than above   All other system neg per pt    Objective:   Physical Exam BP 140/82   Pulse 100   Resp 20   Wt 220 lb (99.8 kg)   SpO2 95%   BMI 42.97 kg/m  VS noted,  Constitutional: Pt appears in no  apparent distress HENT: Head: NCAT.  Right Ear: External ear normal.  Left Ear: External ear normal.  Eyes: . Pupils are equal, round, and reactive to light. Conjunctivae and EOM are normal Neck: Normal range of motion. Neck supple.  Cardiovascular: Normal rate and regular rhythm.   Pulmonary/Chest: Effort normal and breath sounds without rales or wheezing.  Abd:  Soft, NT, ND, + BS Neurological: Pt is alert. Not confused , motor grossly intact Skin: Skin is warm. No rash, no LE edema Psychiatric: Pt behavior is normal. No agitation.  Spine nontender but: bilat lumbar paravertebral spasm left > right Right knee with trace effusion, reduced ROM, NT, neg lochmans     Assessment & Plan:

## 2016-08-26 NOTE — Assessment & Plan Note (Signed)
Acute onset after fell as chair went out from under here, no neuro changes but mod to severe pain, for toradol 30 IM now, ls spine films - r/o fx, and tramadol/flexeril prn, consider ortho if not improved

## 2016-08-26 NOTE — Progress Notes (Signed)
Pre visit review using our clinic review tool, if applicable. No additional management support is needed unless otherwise documented below in the visit note. 

## 2016-08-26 NOTE — Patient Instructions (Signed)
You had the pain shot today  Please take all new medication as prescribed - the pain medication and muscle relaxer  Please continue all other medications as before, and refills have been done if requested.  Please have the pharmacy call with any other refills you may need.  Please keep your appointments with your specialists as you may have planned  Please go to the XRAY Department in the Basement (go straight as you get off the elevator) for the x-ray testing  You will be contacted by phone if any changes need to be made immediately.  Otherwise, you will receive a letter about your results with an explanation, but please check with MyChart first.  Please remember to sign up for MyChart if you have not done so, as this will be important to you in the future with finding out test results, communicating by private email, and scheduling acute appointments online when needed.

## 2016-08-27 NOTE — Assessment & Plan Note (Signed)
Chronic persistent, ? Mild worsening, I suggested f/u with sport med/dr Katrinka Blazingsmith in this office

## 2016-08-27 NOTE — Assessment & Plan Note (Signed)
stable overall by history and exam, recent data reviewed with pt, and pt to continue medical treatment as before,  to f/u any worsening symptoms or concerns BP Readings from Last 3 Encounters:  08/26/16 140/82  04/12/16 114/67  03/23/16 (!) 152/90

## 2016-11-09 ENCOUNTER — Encounter (HOSPITAL_COMMUNITY): Payer: Self-pay | Admitting: Physician Assistant

## 2016-11-09 DIAGNOSIS — M1711 Unilateral primary osteoarthritis, right knee: Secondary | ICD-10-CM | POA: Diagnosis not present

## 2016-11-09 NOTE — H&P (Signed)
TOTAL KNEE ADMISSION H&P  Patient is being admitted for right total knee arthroplasty.  Subjective:  Chief Complaint:right knee pain.  HPI: Bianca Campbell, 61 y.o. female, has a history of pain and functional disability in the right knee due to arthritis and has failed non-surgical conservative treatments for greater than 12 weeks to includeNSAID's and/or analgesics, corticosteriod injections, viscosupplementation injections, flexibility and strengthening excercises, supervised PT with diminished ADL's post treatment, weight reduction as appropriate and activity modification.  Onset of symptoms was gradual, starting 10 years ago with gradually worsening course since that time. The patient noted prior procedures on the knee to include  arthroscopy and menisectomy on the right knee(s).  Patient currently rates pain in the right knee(s) at 10 out of 10 with activity. Patient has night pain, worsening of pain with activity and weight bearing, pain that interferes with activities of daily living, crepitus and joint swelling.  Patient has evidence of subchondral sclerosis, periarticular osteophytes and joint space narrowing by imaging studies.  There is no active infection.  Patient Active Problem List   Diagnosis Date Noted  . Primary localized osteoarthritis of right knee   . Abdominal pain 01/15/2016  . Orthostasis 09/15/2015  . Pain of multiple sites 01/15/2015  . Bunion, right foot 12/30/2014  . Elevated total protein 09/09/2014  . Hyperlipidemia 03/13/2013  . Unspecified vitamin D deficiency 03/13/2013  . Bilateral foot pain 08/31/2012  . Anemia, unspecified 08/31/2012  . Pain in both feet 07/10/2012  . Medial epicondylitis of left elbow 07/10/2012  . Medial epicondylitis of right elbow 07/10/2012  . Left knee pain 07/10/2012  . Right shoulder pain 05/22/2012  . Right foot strain 05/22/2012  . Right knee pain 03/25/2012  . Preventative health care 06/24/2011  . Impaired glucose  tolerance 06/24/2011  . Gastroenteritis 06/24/2011  . Hypercalcemia 06/24/2011  . SHOULDER PAIN, RIGHT 12/30/2008  . Gout 11/14/2007  . Backache 11/14/2007  . ALLERGIC RHINITIS 09/11/2007  . INTERMITTENT VERTIGO 09/11/2007  . Essential hypertension 06/06/2007  . GERD 06/06/2007  . Irritable bowel syndrome 06/06/2007  . OSTEOARTHRITIS 06/06/2007  . Low back pain 06/06/2007   Past Medical History:  Diagnosis Date  . ALLERGIC RHINITIS 09/11/2007  . Anemia, unspecified 08/31/2012  . BACK PAIN 11/14/2007  . EXTERNAL OTITIS 09/11/2007  . GERD 06/06/2007  . GOUT 11/14/2007  . Hernia   . HYPERTENSION 06/06/2007  . INTERMITTENT VERTIGO 09/11/2007  . Irritable bowel syndrome 06/06/2007  . LOW BACK PAIN 06/06/2007  . OSTEOARTHRITIS 06/06/2007  . Other and unspecified hyperlipidemia 03/13/2013  . Primary localized osteoarthritis of right knee   . SHOULDER PAIN, RIGHT 12/30/2008    Past Surgical History:  Procedure Laterality Date  . CESAREAN SECTION  1987, 1991   x 2  . CHOLECYSTECTOMY  1991  . COLONOSCOPY     pt stated 2015 or 2016 unknown md  . EXPLORATORY LAPAROTOMY     bowel obstruction  . inguinal herniorrhapy     numerous hernia repairs 1996 to 2011  . KNEE ARTHROSCOPY  11/11/2015  . s/p incarcerated hernia and ovary cyst Jan 2011  2011    No current facility-administered medications for this encounter.   Current Outpatient Prescriptions:  .  acetaminophen (TYLENOL) 650 MG CR tablet, Take 1,300 mg by mouth every 8 (eight) hours as needed for pain., Disp: , Rfl:  .  amLODipine (NORVASC) 10 MG tablet, TAKE ONE TABLET BY MOUTH ONCE DAILY, Disp: 90 tablet, Rfl: 1 .  Cholecalciferol (VITAMIN D3) 5000 units  CAPS, Take 5,000 Units by mouth daily. , Disp: , Rfl:  .  cyclobenzaprine (FLEXERIL) 5 MG tablet, Take 1 tablet (5 mg total) by mouth 3 (three) times daily as needed for muscle spasms. (Patient taking differently: Take 5 mg by mouth 3 (three) times daily as needed for muscle spasms.  ), Disp: 60 tablet, Rfl: 1 .  dicyclomine (BENTYL) 20 MG tablet, TAKE ONE TABLET BY MOUTH THREE TIMES DAILY BEFORE MEAL(S) (Patient taking differently: TAKE ONE TABLET BY MOUTH THREE TIMES AS NEEDED FOR STOMACH CRAMPING), Disp: 50 tablet, Rfl: 1 .  irbesartan (AVAPRO) 300 MG tablet, TAKE ONE TABLET BY MOUTH ONCE DAILY, Disp: 90 tablet, Rfl: 1 .  Multiple Vitamin (MULTIVITAMIN) tablet, Take 1 tablet by mouth daily., Disp: , Rfl:  .  nabumetone (RELAFEN) 500 MG tablet, Take 500 mg by mouth daily., Disp: , Rfl:  .  OVER THE COUNTER MEDICATION, Apply 1 application topically 2 (two) times daily as needed. JOINT FLEX CREAM FOR JOINT PAIN, Disp: , Rfl:  .  pantoprazole (PROTONIX) 40 MG tablet, Take 1 tablet (40 mg total) by mouth daily., Disp: 90 tablet, Rfl: 3 .  traMADol (ULTRAM) 50 MG tablet, Take 1 tablet (50 mg total) by mouth every 6 (six) hours as needed. (Patient taking differently: Take 50 mg by mouth every 6 (six) hours as needed for moderate pain. ), Disp: 60 tablet, Rfl: 1 .  benzonatate (TESSALON) 100 MG capsule, Take 1 capsule (100 mg total) by mouth every 8 (eight) hours. (Patient not taking: Reported on 11/04/2016), Disp: 21 capsule, Rfl: 0 .  omeprazole (PRILOSEC) 20 MG capsule, Take 1 capsule (20 mg total) by mouth daily. (Patient not taking: Reported on 11/04/2016), Disp: 30 capsule, Rfl: 0    No Known Allergies  Social History  Substance Use Topics  . Smoking status: Never Smoker  . Smokeless tobacco: Never Used  . Alcohol use No    Family History  Problem Relation Age of Onset  . Arthritis Mother     RA  . Colon polyps Father 71  . Colon cancer Neg Hx      Review of Systems  Constitutional: Negative.   HENT: Negative.   Eyes: Negative.   Respiratory: Negative.   Cardiovascular: Negative.   Gastrointestinal: Negative.   Genitourinary: Negative.   Musculoskeletal: Positive for back pain and joint pain.  Skin: Negative.   Neurological: Negative.   Endo/Heme/Allergies:  Negative.   Psychiatric/Behavioral: Negative.     Objective:  Physical Exam  Constitutional: She is oriented to person, place, and time. She appears well-developed and well-nourished.  HENT:  Head: Normocephalic and atraumatic.  Mouth/Throat: Oropharynx is clear and moist.  Eyes: Conjunctivae are normal. Pupils are equal, round, and reactive to light.  Neck: Neck supple.  Cardiovascular: Normal rate and regular rhythm.   Respiratory: Effort normal.  GI: Soft.  Genitourinary:  Genitourinary Comments: Not pertinent to current symptomatology therefore not examined.  Musculoskeletal:  Examination of her right knee reveals pain medially and laterally.  1+ synovitis.  1+ crepitation.  Range of motion 0-120 degrees.  Knee is stable with normal patella tracking and diffuse pain.  Examination of her left knee reveals full range of motion without pain, swelling, weakness or instability.  Vascular exam: Pulses are 2+ and symmetric.  Neurologic exam: Distal motor and sensory examination is within normal limits.      Neurological: She is alert and oriented to person, place, and time.  Skin: Skin is warm and dry.  Psychiatric:  She has a normal mood and affect. Her behavior is normal.    Vital signs in last 24 hours: Temp:  [97.8 F (36.6 C)] 97.8 F (36.6 C) (01/24 1500) Pulse Rate:  [85] 85 (01/24 1500) BP: (149)/(88) 149/88 (01/24 1500) SpO2:  [98 %] 98 % (01/24 1500) Weight:  [100.2 kg (221 lb)] 100.2 kg (221 lb) (01/24 1500)  Labs:   Estimated body mass index is 46.19 kg/m as calculated from the following:   Height as of this encounter: 4\' 10"  (1.473 m).   Weight as of this encounter: 100.2 kg (221 lb).   Imaging Review Plain radiographs demonstrate severe degenerative joint disease of the right knee(s). The overall alignment issignificant varus. The bone quality appears to be good for age and reported activity level.  Assessment/Plan:  End stage arthritis, right knee   Principal Problem:   Primary localized osteoarthritis of right knee Active Problems:   Gout   Essential hypertension   GERD   Irritable bowel syndrome   Low back pain   Impaired glucose tolerance   Hypercalcemia   The patient history, physical examination, clinical judgment of the provider and imaging studies are consistent with end stage degenerative joint disease of the right knee(s) and total knee arthroplasty is deemed medically necessary. The treatment options including medical management, injection therapy arthroscopy and arthroplasty were discussed at length. The risks and benefits of total knee arthroplasty were presented and reviewed. The risks due to aseptic loosening, infection, stiffness, patella tracking problems, thromboembolic complications and other imponderables were discussed. The patient acknowledged the explanation, agreed to proceed with the plan and consent was signed. Patient is being admitted for inpatient treatment for surgery, pain control, PT, OT, prophylactic antibiotics, VTE prophylaxis, progressive ambulation and ADL's and discharge planning. The patient is planning to be discharged home with home health services

## 2016-11-09 NOTE — Pre-Procedure Instructions (Signed)
Charlann BoxerLinda P Lenig  11/09/2016      [id number] - [address] ([place]), [place] - [address] [place] [id number] Phone: [phone] Fax: [phone]    Your procedure is scheduled on February 5  [facility]  Call this number if you have problems the morning of surgery:  [phone]   Remember:  Do not eat food or drink liquids after midnight.   Take these medicines the morning of surgery with A SIP OF WATER acetaminophen (TYLENOL) if needed, amLODipine (NORVASC), cyclobenzaprine (FLEXERIL) if needed, pantoprazole (PROTONIX)  7 days prior to surgery STOP taking any nabumetone (RELAFEN),  Aspirin, Aleve, Naproxen, Ibuprofen, Motrin, Advil, Goody's, BC's, all herbal medications, fish oil, and all vitamins   Do not wear jewelry, make-up or nail polish.  Do not wear lotions, powders, or perfumes, or deoderant.  Do not shave 48 hours prior to surgery.   Do not bring valuables to the hospital.  [facility] is not [facility] any belongings or valuables.  Contacts, dentures or bridgework may not be worn into surgery.  Leave your suitcase in the car.  After surgery it may be brought to your room.  For patients admitted to the hospital, discharge time will be determined by your treatment team.  Patients discharged the day of surgery will not be allowed to drive home.   Special instructions:   [facility]- Preparing For Surgery  Before surgery, you can play an important role. Because skin is not sterile, your skin needs to be as free of germs as possible. You can reduce the number of germs on your skin by washing with CHG (chlorahexidine gluconate) Soap before surgery.  CHG is an antiseptic cleaner which kills germs and bonds with the skin to continue killing germs even after washing.  Please do not use if you have an allergy to CHG or antibacterial soaps. If your skin becomes reddened/irritated stop  using the CHG.  Do not shave (including legs and underarms) for at least 48 hours prior to first CHG shower. It is OK to shave your face.  Please follow these instructions carefully.   1. Shower the NIGHT BEFORE SURGERY and the MORNING OF SURGERY with CHG.   2. If you chose to wash your hair, wash your hair first as usual with your normal shampoo.  3. After you shampoo, rinse your hair and body thoroughly to remove the shampoo.  4. Use CHG as you would any other liquid soap. You can apply CHG directly to the skin and wash gently with a scrungie or a clean washcloth.   5. Apply the CHG Soap to your body ONLY FROM THE NECK DOWN.  Do not use on open wounds or open sores. Avoid contact with your eyes, ears, mouth and genitals (private parts). Wash genitals (private parts) with your normal soap.  6. Wash thoroughly, paying special attention to the area where your surgery will be performed.  7. Thoroughly rinse your body with warm water from the neck down.  8. DO NOT shower/wash with your normal soap after using and rinsing off the CHG Soap.  9. Pat yourself dry with a CLEAN TOWEL.   10. Wear CLEAN PAJAMAS   11. Place CLEAN SHEETS on your bed the night of your first shower and DO NOT SLEEP WITH PETS.    Day of Surgery: Do not apply any deodorants/lotions. Please wear clean clothes to the hospital/surgery  center.      Please read over the following fact sheets that you were given.

## 2016-11-10 ENCOUNTER — Encounter (HOSPITAL_COMMUNITY)
Admission: RE | Admit: 2016-11-10 | Discharge: 2016-11-10 | Disposition: A | Discharge status: Home / Self Care | Payer: 59 | Source: Ambulatory Visit | Attending: Orthopedic Surgery | Admitting: Orthopedic Surgery

## 2016-11-10 ENCOUNTER — Encounter (HOSPITAL_COMMUNITY): Payer: Self-pay

## 2016-11-10 DIAGNOSIS — M1711 Unilateral primary osteoarthritis, right knee: Secondary | ICD-10-CM | POA: Diagnosis not present

## 2016-11-10 DIAGNOSIS — Z01810 Encounter for preprocedural cardiovascular examination: Secondary | ICD-10-CM | POA: Insufficient documentation

## 2016-11-10 DIAGNOSIS — Z01812 Encounter for preprocedural laboratory examination: Secondary | ICD-10-CM | POA: Diagnosis present

## 2016-11-10 HISTORY — DX: Nausea with vomiting, unspecified: R11.2

## 2016-11-10 HISTORY — DX: Other specified postprocedural states: Z98.890

## 2016-11-10 LAB — COMPREHENSIVE METABOLIC PANEL
ALT: 21 U/L (ref 14–54)
AST: 22 U/L (ref 15–41)
Albumin: 3.9 g/dL (ref 3.5–5.0)
Alkaline Phosphatase: 89 U/L (ref 38–126)
Anion gap: 9 (ref 5–15)
BUN: 12 mg/dL (ref 6–20)
CHLORIDE: 106 mmol/L (ref 101–111)
CO2: 26 mmol/L (ref 22–32)
CREATININE: 0.58 mg/dL (ref 0.44–1.00)
Calcium: 9.9 mg/dL (ref 8.9–10.3)
GFR calc Af Amer: >60 mL/min (ref >60)
GFR calc non Af Amer: >60 mL/min (ref >60)
Glucose, Bld: 134 mg/dL — ABNORMAL HIGH (ref 65–99)
Potassium: 3.7 mmol/L (ref 3.5–5.1)
SODIUM: 141 mmol/L (ref 135–145)
Total Bilirubin: 0.7 mg/dL (ref 0.3–1.2)
Total Protein: 8.3 g/dL — ABNORMAL HIGH (ref 6.5–8.1)

## 2016-11-10 LAB — CBC WITH DIFFERENTIAL/PLATELET
BASOS ABS: 0 10*3/uL (ref 0.0–0.1)
Basophils Relative: 0 %
EOS ABS: 0.2 10*3/uL (ref 0.0–0.7)
EOS PCT: 4 %
HCT: 37.6 % (ref 36.0–46.0)
HEMOGLOBIN: 12.4 g/dL (ref 12.0–15.0)
LYMPHS PCT: 42 %
Lymphs Abs: 1.9 10*3/uL (ref 0.7–4.0)
MCH: 27 pg (ref 26.0–34.0)
MCHC: 33 g/dL (ref 30.0–36.0)
MCV: 81.9 fL (ref 78.0–100.0)
Monocytes Absolute: 0.2 10*3/uL (ref 0.1–1.0)
Monocytes Relative: 5 %
NEUTROS PCT: 49 %
Neutro Abs: 2.3 10*3/uL (ref 1.7–7.7)
PLATELETS: 294 10*3/uL (ref 150–400)
RBC: 4.59 MIL/uL (ref 3.87–5.11)
RDW: 13.9 % (ref 11.5–15.5)
WBC: 4.7 10*3/uL (ref 4.0–10.5)

## 2016-11-10 LAB — PROTIME-INR
INR: 1.06
Prothrombin Time: 13.8 seconds (ref 11.4–15.2)

## 2016-11-10 LAB — TYPE AND SCREEN
ABO/RH(D): O POS
Antibody Screen: NEGATIVE

## 2016-11-10 LAB — SURGICAL PCR SCREEN
MRSA, PCR: NEGATIVE
STAPHYLOCOCCUS AUREUS: POSITIVE — AB

## 2016-11-10 LAB — APTT: APTT: 34 s (ref 24–36)

## 2016-11-10 LAB — ABO/RH: ABO/RH(D): O POS

## 2016-11-10 NOTE — Progress Notes (Signed)
   11/10/16 1122  OBSTRUCTIVE SLEEP APNEA  Have you ever been diagnosed with sleep apnea through a sleep study? No  Do you snore loudly (loud enough to be heard through closed doors)?  1  Do you often feel tired, fatigued, or sleepy during the daytime (such as falling asleep during driving or talking to someone)? 0  Has anyone observed you stop breathing during your sleep? 0  Do you have, or are you being treated for high blood pressure? 1  BMI more than 35 kg/m2? 1  Age > 50 (1-yes) 1  Neck circumference greater than:Female 16 inches or larger, Female 17inches or larger? 1  Female Gender (Yes=1) 0  Obstructive Sleep Apnea Score 5  Score 5 or greater  Results sent to PCP

## 2016-11-11 LAB — URINE CULTURE

## 2016-11-18 MED ORDER — LACTATED RINGERS IV SOLN: INTRAVENOUS | Status: DC

## 2016-11-18 MED ORDER — DEXTROSE 5 % IV SOLN
3.0000 g | INTRAVENOUS | Status: AC
  Administered 2016-11-21: 3 g via INTRAVENOUS
  Filled 2016-11-18: qty 3000

## 2016-11-20 NOTE — Anesthesia Preprocedure Evaluation (Addendum)
Anesthesia Evaluation  Patient identified by MRN, date of birth, ID band Patient awake    Reviewed: Allergy & Precautions, NPO status , Patient's Chart, lab work & pertinent test results  History of Anesthesia Complications (+) PONV and history of anesthetic complications  Airway Mallampati: II  TM Distance: >3 FB Neck ROM: full    Dental  (+) Teeth Intact, Dental Advidsory Given, Chipped, Missing,    Pulmonary neg pulmonary ROS,    Pulmonary exam normal breath sounds clear to auscultation       Cardiovascular hypertension, Pt. on medications  Rhythm:Regular Rate:Normal     Neuro/Psych  Headaches, negative psych ROS   GI/Hepatic Neg liver ROS, GERD  Medicated and Controlled,  Endo/Other  Morbid obesity  Renal/GU negative Renal ROS     Musculoskeletal  (+) Arthritis , Osteoarthritis,    Abdominal   Peds  Hematology negative hematology ROS (+) anemia , Plt 294k   Anesthesia Other Findings Day of surgery medications reviewed with the patient.  Reproductive/Obstetrics negative OB ROS                          Anesthesia Physical Anesthesia Plan  ASA: III  Anesthesia Plan: Spinal and MAC   Post-op Pain Management:  Regional for Post-op pain   Induction: Intravenous  Airway Management Planned: Simple Face Mask  Additional Equipment:   Intra-op Plan:   Post-operative Plan:   Informed Consent: I have reviewed the patients History and Physical, chart, labs and discussed the procedure including the risks, benefits and alternatives for the proposed anesthesia with the patient or authorized representative who has indicated his/her understanding and acceptance.   Dental advisory given  Plan Discussed with: CRNA, Anesthesiologist and Surgeon  Anesthesia Plan Comments: (Discussed risks and benefits of and differences between spinal and general. Discussed risks of spinal including headache,  backache, failure, bleeding, infection, and nerve damage. Patient consents to spinal. Questions answered. Coagulation studies and platelet count acceptable.)        Anesthesia Quick Evaluation

## 2016-11-21 ENCOUNTER — Inpatient Hospital Stay (HOSPITAL_COMMUNITY): Payer: 59 | Admitting: Anesthesiology

## 2016-11-21 ENCOUNTER — Inpatient Hospital Stay (HOSPITAL_COMMUNITY)
Admission: RE | Admit: 2016-11-21 | Discharge: 2016-11-24 | DRG: 470 | Disposition: A | Discharge status: Home / Self Care | Payer: 59 | Source: Ambulatory Visit | Attending: Orthopedic Surgery | Admitting: Orthopedic Surgery

## 2016-11-21 ENCOUNTER — Encounter (HOSPITAL_COMMUNITY): Payer: Self-pay | Admitting: *Deleted

## 2016-11-21 ENCOUNTER — Encounter (HOSPITAL_COMMUNITY)
Admission: RE | Disposition: A | Discharge status: Home / Self Care | Payer: Self-pay | Source: Ambulatory Visit | Attending: Orthopedic Surgery

## 2016-11-21 DIAGNOSIS — Z96651 Presence of right artificial knee joint: Secondary | ICD-10-CM

## 2016-11-21 DIAGNOSIS — M545 Low back pain, unspecified: Secondary | ICD-10-CM | POA: Diagnosis present

## 2016-11-21 DIAGNOSIS — Z6841 Body Mass Index (BMI) 40.0 and over, adult: Secondary | ICD-10-CM | POA: Diagnosis not present

## 2016-11-21 DIAGNOSIS — K589 Irritable bowel syndrome without diarrhea: Secondary | ICD-10-CM | POA: Diagnosis present

## 2016-11-21 DIAGNOSIS — D649 Anemia, unspecified: Secondary | ICD-10-CM | POA: Diagnosis not present

## 2016-11-21 DIAGNOSIS — M1711 Unilateral primary osteoarthritis, right knee: Principal | ICD-10-CM | POA: Diagnosis present

## 2016-11-21 DIAGNOSIS — I10 Essential (primary) hypertension: Secondary | ICD-10-CM | POA: Diagnosis not present

## 2016-11-21 DIAGNOSIS — M25562 Pain in left knee: Secondary | ICD-10-CM | POA: Diagnosis not present

## 2016-11-21 DIAGNOSIS — R7302 Impaired glucose tolerance (oral): Secondary | ICD-10-CM | POA: Diagnosis not present

## 2016-11-21 DIAGNOSIS — K219 Gastro-esophageal reflux disease without esophagitis: Secondary | ICD-10-CM | POA: Diagnosis present

## 2016-11-21 DIAGNOSIS — M25561 Pain in right knee: Secondary | ICD-10-CM | POA: Diagnosis not present

## 2016-11-21 DIAGNOSIS — Z79899 Other long term (current) drug therapy: Secondary | ICD-10-CM | POA: Diagnosis not present

## 2016-11-21 DIAGNOSIS — M109 Gout, unspecified: Secondary | ICD-10-CM | POA: Diagnosis present

## 2016-11-21 DIAGNOSIS — G8918 Other acute postprocedural pain: Secondary | ICD-10-CM | POA: Diagnosis not present

## 2016-11-21 HISTORY — PX: TOTAL KNEE ARTHROPLASTY: SHX125

## 2016-11-21 HISTORY — DX: Pure hypercholesterolemia, unspecified: E78.00

## 2016-11-21 HISTORY — DX: Unspecified osteoarthritis, unspecified site: M19.90

## 2016-11-21 HISTORY — DX: Unilateral primary osteoarthritis, right knee: M17.11

## 2016-11-21 LAB — GLUCOSE, CAPILLARY
GLUCOSE-CAPILLARY: 161 mg/dL — AB (ref 65–99)
GLUCOSE-CAPILLARY: 150 mg/dL — AB (ref 65–99)
Glucose-Capillary: 159 mg/dL — ABNORMAL HIGH (ref 65–99)

## 2016-11-21 LAB — CBC
HCT: 38.5 % (ref 36.0–46.0)
Hemoglobin: 12.5 g/dL (ref 12.0–15.0)
MCH: 26.5 pg (ref 26.0–34.0)
MCHC: 32.5 g/dL (ref 30.0–36.0)
MCV: 81.6 fL (ref 78.0–100.0)
Platelets: 283 10*3/uL (ref 150–400)
RBC: 4.72 MIL/uL (ref 3.87–5.11)
RDW: 13.4 % (ref 11.5–15.5)
WBC: 6.8 10*3/uL (ref 4.0–10.5)

## 2016-11-21 LAB — CREATININE, SERUM
Creatinine, Ser: 0.64 mg/dL (ref 0.44–1.00)
GFR calc Af Amer: >60 mL/min (ref >60)
GFR calc non Af Amer: >60 mL/min (ref >60)

## 2016-11-21 SURGERY — ARTHROPLASTY, KNEE, TOTAL
Anesthesia: Spinal | Site: Knee | Laterality: Right

## 2016-11-21 MED ORDER — HYDROMORPHONE HCL 2 MG/ML IJ SOLN
1.0000 mg | INTRAMUSCULAR | Status: DC | PRN
  Administered 2016-11-21 – 2016-11-22 (×3): 1 mg via INTRAVENOUS
  Filled 2016-11-21 (×3): qty 1

## 2016-11-21 MED ORDER — PHENYLEPHRINE HCL 10 MG/ML IJ SOLN
INTRAMUSCULAR | Status: DC | PRN
  Administered 2016-11-21: 08:00:00 via INTRAVENOUS
  Administered 2016-11-21: 50 ug/min via INTRAVENOUS

## 2016-11-21 MED ORDER — ROPIVACAINE HCL 7.5 MG/ML IJ SOLN
INTRAMUSCULAR | Status: DC | PRN
  Administered 2016-11-21: 20 mL via PERINEURAL

## 2016-11-21 MED ORDER — MIDAZOLAM HCL 5 MG/5ML IJ SOLN
INTRAMUSCULAR | Status: DC | PRN
  Administered 2016-11-21: 2 mg via INTRAVENOUS

## 2016-11-21 MED ORDER — OXYCODONE HCL 5 MG PO TABS
5.0000 mg | ORAL_TABLET | ORAL | Status: DC | PRN
  Administered 2016-11-21 – 2016-11-23 (×13): 10 mg via ORAL
  Filled 2016-11-21 (×14): qty 2

## 2016-11-21 MED ORDER — DIPHENHYDRAMINE HCL 12.5 MG/5ML PO ELIX: 12.5000 mg | ORAL_SOLUTION | ORAL | Status: DC | PRN

## 2016-11-21 MED ORDER — CYCLOBENZAPRINE HCL 10 MG PO TABS
5.0000 mg | ORAL_TABLET | Freq: Three times a day (TID) | ORAL | Status: DC | PRN
  Administered 2016-11-21 – 2016-11-24 (×5): 5 mg via ORAL
  Filled 2016-11-21 (×5): qty 1

## 2016-11-21 MED ORDER — IRBESARTAN 300 MG PO TABS
300.0000 mg | ORAL_TABLET | Freq: Every day | ORAL | Status: DC
  Administered 2016-11-23 – 2016-11-24 (×2): 300 mg via ORAL
  Filled 2016-11-21 (×2): qty 1

## 2016-11-21 MED ORDER — CEFAZOLIN SODIUM-DEXTROSE 2-4 GM/100ML-% IV SOLN
2.0000 g | Freq: Four times a day (QID) | INTRAVENOUS | Status: AC
  Administered 2016-11-21 (×2): 2 g via INTRAVENOUS
  Filled 2016-11-21 (×2): qty 100

## 2016-11-21 MED ORDER — SODIUM CHLORIDE 0.9 % IR SOLN
Status: DC | PRN
  Administered 2016-11-21: 3000 mL

## 2016-11-21 MED ORDER — BUPIVACAINE HCL (PF) 0.25 % IJ SOLN
INTRAMUSCULAR | Status: DC | PRN
  Administered 2016-11-21: 30 mL

## 2016-11-21 MED ORDER — PROMETHAZINE HCL 25 MG/ML IJ SOLN: 6.2500 mg | INTRAMUSCULAR | Status: DC | PRN

## 2016-11-21 MED ORDER — ONDANSETRON HCL 4 MG/2ML IJ SOLN
4.0000 mg | Freq: Four times a day (QID) | INTRAMUSCULAR | Status: DC | PRN
  Administered 2016-11-21: 4 mg via INTRAVENOUS
  Filled 2016-11-21: qty 2

## 2016-11-21 MED ORDER — ONDANSETRON HCL 4 MG/2ML IJ SOLN
INTRAMUSCULAR | Status: AC
  Filled 2016-11-21: qty 2

## 2016-11-21 MED ORDER — BUPIVACAINE HCL (PF) 0.25 % IJ SOLN
INTRAMUSCULAR | Status: AC
  Filled 2016-11-21: qty 30

## 2016-11-21 MED ORDER — CELECOXIB 200 MG PO CAPS
200.0000 mg | ORAL_CAPSULE | Freq: Two times a day (BID) | ORAL | Status: DC
  Administered 2016-11-21 – 2016-11-23 (×5): 200 mg via ORAL
  Filled 2016-11-21 (×5): qty 1

## 2016-11-21 MED ORDER — FENTANYL CITRATE (PF) 100 MCG/2ML IJ SOLN
INTRAMUSCULAR | Status: AC
  Filled 2016-11-21: qty 2

## 2016-11-21 MED ORDER — DEXAMETHASONE SODIUM PHOSPHATE 10 MG/ML IJ SOLN
INTRAMUSCULAR | Status: DC | PRN
  Administered 2016-11-21: 10 mg via INTRAVENOUS

## 2016-11-21 MED ORDER — MENTHOL 3 MG MT LOZG: 1.0000 | LOZENGE | OROMUCOSAL | Status: DC | PRN

## 2016-11-21 MED ORDER — PHENOL 1.4 % MT LIQD: 1.0000 | OROMUCOSAL | Status: DC | PRN

## 2016-11-21 MED ORDER — VITAMIN D 1000 UNITS PO TABS
5000.0000 [IU] | ORAL_TABLET | Freq: Every day | ORAL | Status: DC
  Administered 2016-11-22 – 2016-11-24 (×3): 5000 [IU] via ORAL
  Filled 2016-11-21 (×3): qty 5

## 2016-11-21 MED ORDER — EPINEPHRINE PF 1 MG/ML IJ SOLN
INTRAMUSCULAR | Status: AC
  Filled 2016-11-21: qty 1

## 2016-11-21 MED ORDER — POLYETHYLENE GLYCOL 3350 17 G PO PACK
17.0000 g | PACK | Freq: Two times a day (BID) | ORAL | Status: DC
  Administered 2016-11-21 – 2016-11-24 (×6): 17 g via ORAL
  Filled 2016-11-21 (×6): qty 1

## 2016-11-21 MED ORDER — LACTATED RINGERS IV SOLN
INTRAVENOUS | Status: DC | PRN
  Administered 2016-11-21 (×2): via INTRAVENOUS

## 2016-11-21 MED ORDER — CHLORHEXIDINE GLUCONATE 4 % EX LIQD: 60.0000 mL | Freq: Once | CUTANEOUS | Status: DC

## 2016-11-21 MED ORDER — MIDAZOLAM HCL 2 MG/2ML IJ SOLN
INTRAMUSCULAR | Status: AC
  Filled 2016-11-21: qty 2

## 2016-11-21 MED ORDER — EPINEPHRINE PF 1 MG/ML IJ SOLN
INTRAMUSCULAR | Status: DC | PRN
  Administered 2016-11-21: .15 mL

## 2016-11-21 MED ORDER — DEXAMETHASONE SODIUM PHOSPHATE 10 MG/ML IJ SOLN
10.0000 mg | Freq: Three times a day (TID) | INTRAMUSCULAR | Status: AC
  Administered 2016-11-21 – 2016-11-22 (×3): 10 mg via INTRAVENOUS
  Filled 2016-11-21 (×4): qty 1

## 2016-11-21 MED ORDER — METOCLOPRAMIDE HCL 5 MG/ML IJ SOLN
5.0000 mg | Freq: Three times a day (TID) | INTRAMUSCULAR | Status: DC | PRN
  Administered 2016-11-21: 10 mg via INTRAVENOUS
  Filled 2016-11-21: qty 2

## 2016-11-21 MED ORDER — INSULIN ASPART 100 UNIT/ML ~~LOC~~ SOLN: 0.0000 [IU] | Freq: Every day | SUBCUTANEOUS | Status: DC

## 2016-11-21 MED ORDER — DOCUSATE SODIUM 100 MG PO CAPS
100.0000 mg | ORAL_CAPSULE | Freq: Two times a day (BID) | ORAL | Status: DC
  Administered 2016-11-21 – 2016-11-24 (×7): 100 mg via ORAL
  Filled 2016-11-21 (×7): qty 1

## 2016-11-21 MED ORDER — ONDANSETRON HCL 4 MG PO TABS: 4.0000 mg | ORAL_TABLET | Freq: Four times a day (QID) | ORAL | Status: DC | PRN

## 2016-11-21 MED ORDER — POVIDONE-IODINE 7.5 % EX SOLN
Freq: Once | CUTANEOUS | Status: DC
  Filled 2016-11-21: qty 118

## 2016-11-21 MED ORDER — PROPOFOL 500 MG/50ML IV EMUL
INTRAVENOUS | Status: DC | PRN
  Administered 2016-11-21: 75 ug/kg/min via INTRAVENOUS

## 2016-11-21 MED ORDER — BUPIVACAINE IN DEXTROSE 0.75-8.25 % IT SOLN
INTRATHECAL | Status: DC | PRN
  Administered 2016-11-21: 2 mL via INTRATHECAL

## 2016-11-21 MED ORDER — DICYCLOMINE HCL 20 MG PO TABS: 20.0000 mg | ORAL_TABLET | Freq: Three times a day (TID) | ORAL | Status: DC | PRN

## 2016-11-21 MED ORDER — INSULIN ASPART 100 UNIT/ML ~~LOC~~ SOLN
0.0000 [IU] | Freq: Three times a day (TID) | SUBCUTANEOUS | Status: DC
  Administered 2016-11-21: 3 [IU] via SUBCUTANEOUS
  Administered 2016-11-21 – 2016-11-22 (×4): 4 [IU] via SUBCUTANEOUS
  Administered 2016-11-23 (×2): 3 [IU] via SUBCUTANEOUS
  Administered 2016-11-23: 4 [IU] via SUBCUTANEOUS

## 2016-11-21 MED ORDER — POTASSIUM CHLORIDE IN NACL 20-0.9 MEQ/L-% IV SOLN
INTRAVENOUS | Status: DC
  Administered 2016-11-21: 13:00:00 via INTRAVENOUS
  Filled 2016-11-21 (×2): qty 1000

## 2016-11-21 MED ORDER — ENOXAPARIN SODIUM 30 MG/0.3ML ~~LOC~~ SOLN
30.0000 mg | Freq: Two times a day (BID) | SUBCUTANEOUS | Status: DC
  Administered 2016-11-22 – 2016-11-24 (×5): 30 mg via SUBCUTANEOUS
  Filled 2016-11-21 (×5): qty 0.3

## 2016-11-21 MED ORDER — PROPOFOL 10 MG/ML IV BOLUS
INTRAVENOUS | Status: AC
Start: 2016-11-21 — End: 2016-11-21
  Filled 2016-11-21: qty 20

## 2016-11-21 MED ORDER — FENTANYL CITRATE (PF) 100 MCG/2ML IJ SOLN
INTRAMUSCULAR | Status: DC | PRN
  Administered 2016-11-21 (×2): 50 ug via INTRAVENOUS

## 2016-11-21 MED ORDER — ALUM & MAG HYDROXIDE-SIMETH 200-200-20 MG/5ML PO SUSP
30.0000 mL | ORAL | Status: DC | PRN
  Administered 2016-11-22 – 2016-11-23 (×2): 30 mL via ORAL
  Filled 2016-11-21 (×2): qty 30

## 2016-11-21 MED ORDER — PANTOPRAZOLE SODIUM 40 MG PO TBEC
40.0000 mg | DELAYED_RELEASE_TABLET | Freq: Every day | ORAL | Status: DC
  Administered 2016-11-21 – 2016-11-24 (×4): 40 mg via ORAL
  Filled 2016-11-21 (×4): qty 1

## 2016-11-21 MED ORDER — ONDANSETRON HCL 4 MG/2ML IJ SOLN
INTRAMUSCULAR | Status: DC | PRN
  Administered 2016-11-21: 4 mg via INTRAVENOUS

## 2016-11-21 MED ORDER — TRANEXAMIC ACID 1000 MG/10ML IV SOLN
1000.0000 mg | INTRAVENOUS | Status: AC
  Administered 2016-11-21: 1000 mg via INTRAVENOUS
  Filled 2016-11-21: qty 10

## 2016-11-21 MED ORDER — ACETAMINOPHEN 650 MG RE SUPP: 650.0000 mg | Freq: Four times a day (QID) | RECTAL | Status: DC | PRN

## 2016-11-21 MED ORDER — FENTANYL CITRATE (PF) 100 MCG/2ML IJ SOLN: 25.0000 ug | INTRAMUSCULAR | Status: DC | PRN

## 2016-11-21 MED ORDER — METOCLOPRAMIDE HCL 5 MG PO TABS: 5.0000 mg | ORAL_TABLET | Freq: Three times a day (TID) | ORAL | Status: DC | PRN

## 2016-11-21 MED ORDER — 0.9 % SODIUM CHLORIDE (POUR BTL) OPTIME
TOPICAL | Status: DC | PRN
  Administered 2016-11-21: 1000 mL

## 2016-11-21 MED ORDER — AMLODIPINE BESYLATE 10 MG PO TABS
10.0000 mg | ORAL_TABLET | Freq: Every day | ORAL | Status: DC
  Administered 2016-11-23 – 2016-11-24 (×2): 10 mg via ORAL
  Filled 2016-11-21 (×2): qty 1

## 2016-11-21 MED ORDER — ACETAMINOPHEN 325 MG PO TABS: 650.0000 mg | ORAL_TABLET | Freq: Four times a day (QID) | ORAL | Status: DC | PRN

## 2016-11-21 MED ORDER — STERILE WATER FOR IRRIGATION IR SOLN
Status: DC | PRN
  Administered 2016-11-21: 1000 mL

## 2016-11-21 SURGICAL SUPPLY — 66 items
BANDAGE ESMARK 6X9 LF (GAUZE/BANDAGES/DRESSINGS) ×1 IMPLANT
BENZOIN TINCTURE PRP APPL 2/3 (GAUZE/BANDAGES/DRESSINGS) ×3 IMPLANT
BLADE SAGITTAL 25.0X1.19X90 (BLADE) ×2 IMPLANT
BLADE SAGITTAL 25.0X1.19X90MM (BLADE) ×1
BLADE SAW SGTL 13X75X1.27 (BLADE) ×3 IMPLANT
BLADE SURG 10 STRL SS (BLADE) ×6 IMPLANT
BNDG ELASTIC 6X15 VLCR STRL LF (GAUZE/BANDAGES/DRESSINGS) ×3 IMPLANT
BNDG ESMARK 6X9 LF (GAUZE/BANDAGES/DRESSINGS) ×3
BOWL SMART MIX CTS (DISPOSABLE) ×3 IMPLANT
CAPT KNEE TOTAL 3 ATTUNE ×3 IMPLANT
CEMENT HV SMART SET (Cement) ×6 IMPLANT
CLOSURE WOUND 1/2 X4 (GAUZE/BANDAGES/DRESSINGS) ×1
COVER SURGICAL LIGHT HANDLE (MISCELLANEOUS) ×3 IMPLANT
CUFF TOURNIQUET SINGLE 34IN LL (TOURNIQUET CUFF) ×3 IMPLANT
CUFF TOURNIQUET SINGLE 44IN (TOURNIQUET CUFF) IMPLANT
DRAPE EXTREMITY T 121X128X90 (DRAPE) ×3 IMPLANT
DRAPE HALF SHEET 40X57 (DRAPES) ×3 IMPLANT
DRAPE PROXIMA HALF (DRAPES) ×3 IMPLANT
DRAPE U-SHAPE 47X51 STRL (DRAPES) ×3 IMPLANT
DRSG AQUACEL AG ADV 3.5X14 (GAUZE/BANDAGES/DRESSINGS) ×3 IMPLANT
DURAPREP 26ML APPLICATOR (WOUND CARE) ×6 IMPLANT
ELECT CAUTERY BLADE 6.4 (BLADE) ×3 IMPLANT
ELECT REM PT RETURN 9FT ADLT (ELECTROSURGICAL) ×3
ELECTRODE REM PT RTRN 9FT ADLT (ELECTROSURGICAL) ×1 IMPLANT
FACESHIELD WRAPAROUND (MASK) ×6 IMPLANT
GLOVE BIOGEL PI IND STRL 6.5 (GLOVE) ×1 IMPLANT
GLOVE BIOGEL PI IND STRL 7.0 (GLOVE) ×2 IMPLANT
GLOVE BIOGEL PI IND STRL 7.5 (GLOVE) ×1 IMPLANT
GLOVE BIOGEL PI INDICATOR 6.5 (GLOVE) ×2
GLOVE BIOGEL PI INDICATOR 7.0 (GLOVE) ×4
GLOVE BIOGEL PI INDICATOR 7.5 (GLOVE) ×2
GLOVE SURG SS PI 6.0 STRL IVOR (GLOVE) ×15 IMPLANT
GLOVE SURG SS PI 6.5 STRL IVOR (GLOVE) ×3 IMPLANT
GLOVE SURG SS PI 7.0 STRL IVOR (GLOVE) ×3 IMPLANT
GLOVE SURG SS PI 7.5 STRL IVOR (GLOVE) ×6 IMPLANT
GOWN STRL REUS W/ TWL LRG LVL3 (GOWN DISPOSABLE) ×3 IMPLANT
GOWN STRL REUS W/ TWL XL LVL3 (GOWN DISPOSABLE) ×1 IMPLANT
GOWN STRL REUS W/TWL LRG LVL3 (GOWN DISPOSABLE) ×6
GOWN STRL REUS W/TWL XL LVL3 (GOWN DISPOSABLE) ×2
HANDPIECE INTERPULSE COAX TIP (DISPOSABLE) ×2
HOOD PEEL AWAY FACE SHEILD DIS (HOOD) ×6 IMPLANT
IMMOBILIZER KNEE 22 UNIV (SOFTGOODS) ×3 IMPLANT
KIT BASIN OR (CUSTOM PROCEDURE TRAY) ×3 IMPLANT
KIT ROOM TURNOVER OR (KITS) ×3 IMPLANT
MANIFOLD NEPTUNE II (INSTRUMENTS) ×3 IMPLANT
MARKER SKIN DUAL TIP RULER LAB (MISCELLANEOUS) ×3 IMPLANT
NEEDLE 18GX1X1/2 (RX/OR ONLY) (NEEDLE) ×3 IMPLANT
NS IRRIG 1000ML POUR BTL (IV SOLUTION) ×3 IMPLANT
PACK TOTAL JOINT (CUSTOM PROCEDURE TRAY) ×3 IMPLANT
PAD ARMBOARD 7.5X6 YLW CONV (MISCELLANEOUS) ×6 IMPLANT
SET HNDPC FAN SPRY TIP SCT (DISPOSABLE) ×1 IMPLANT
STRIP CLOSURE SKIN 1/2X4 (GAUZE/BANDAGES/DRESSINGS) ×2 IMPLANT
SUCTION FRAZIER HANDLE 10FR (MISCELLANEOUS) ×2
SUCTION TUBE FRAZIER 10FR DISP (MISCELLANEOUS) ×1 IMPLANT
SUT MNCRL AB 3-0 PS2 18 (SUTURE) ×3 IMPLANT
SUT VIC AB 0 CT1 27 (SUTURE) ×6
SUT VIC AB 0 CT1 27XBRD ANBCTR (SUTURE) ×3 IMPLANT
SUT VIC AB 1 CT1 27 (SUTURE) ×2
SUT VIC AB 1 CT1 27XBRD ANBCTR (SUTURE) ×1 IMPLANT
SUT VIC AB 2-0 CT1 27 (SUTURE) ×4
SUT VIC AB 2-0 CT1 TAPERPNT 27 (SUTURE) ×2 IMPLANT
SYR 30ML LL (SYRINGE) ×3 IMPLANT
TOWEL OR 17X24 6PK STRL BLUE (TOWEL DISPOSABLE) ×3 IMPLANT
TOWEL OR 17X26 10 PK STRL BLUE (TOWEL DISPOSABLE) ×3 IMPLANT
TUBE CONNECTING 12'X1/4 (SUCTIONS) ×1
TUBE CONNECTING 12X1/4 (SUCTIONS) ×2 IMPLANT

## 2016-11-21 NOTE — Progress Notes (Signed)
Orthopedic Tech Progress Note Patient Details:  Bianca BoxerLinda P Campbell Feb 06, 1956 [id number]  CPM Right Knee CPM Right Knee: On Right Knee Flexion (Degrees): 90 Right Knee Extension (Degrees): 0 Additional Comments: foot roll   Saul FordyceJennifer C Nico Rogness 11/21/2016, 11:38 AM

## 2016-11-21 NOTE — Evaluation (Signed)
Physical Therapy Evaluation Patient Details Name: Bianca Campbell MRN: [id number] DOB: 27-Feb-1956 Today's Date: 11/21/2016   History of Present Illness   [name], 61 y.o. female, has a history of pain and functional disability in the right knee due to arthritis and has failed non-surgical conservative treatments for greater than 12 weeks to includeNSAID's and/or analgesics, corticosteriod injections, viscosupplementation injections, flexibility and strengthening excercises, supervised PT with diminished ADL's post treatment, weight reduction as appropriate and activity modification.    Right TKA with WBAT with knee immobilizer.    Clinical Impression  Pt admitted with above diagnosis. Pt currently with functional limitations due to the deficits listed below (see PT Problem List). Pt was somewhat lethargic today and limited treatment due to this.  Pt was able to get to chair with +2 assist. Will follow acutely.   Pt will benefit from skilled PT to increase their independence and safety with mobility to allow discharge to the venue listed below.      Follow Up Recommendations Home health PT;Supervision/Assistance - 24 hour    Equipment Recommendations  None recommended by PT    Recommendations for Other Services       Precautions / Restrictions Precautions Precautions: Fall Required Braces or Orthoses: Knee Immobilizer - Right Knee Immobilizer - Right: On at all times Restrictions Weight Bearing Restrictions: Yes RLE Weight Bearing: Weight bearing as tolerated      Mobility  Bed Mobility Overal bed mobility: Needs Assistance Bed Mobility: Supine to Sit     Supine to sit: Mod assist     General bed mobility comments: Needed assist for elevation of trunk and to move LEs off bed.  Transfers Overall transfer level: Needs assistance Equipment used: Rolling walker (2 wheeled) Transfers: Sit to/from [employer] Sit to Stand: Mod assist;+2 physical  assistance Stand pivot transfers: Mod assist;+2 physical assistance       General transfer comment: Pt needed mod assist due to pt somewhat lethargic (possibly due to meds) and also due to pt with poor knee stability.  Definitely needs to use right knee immobilizer at this time.   Ambulation/Gait             General Gait Details: Not ready due to pt nauseated and fatigued.  Stairs            Wheelchair Mobility    Modified Rankin (Stroke Patients Only)       Balance Overall balance assessment: Needs assistance Sitting-balance support: Bilateral upper extremity supported;Feet supported Sitting balance-Leahy Scale: Poor Sitting balance - Comments: relied on UEs for support at EOB.   Standing balance support: Bilateral upper extremity supported;During functional activity Standing balance-Leahy Scale: Poor Standing balance comment: relies on UEs for support                             Pertinent Vitals/Pain Pain Assessment: 0-10 Pain Score: 8  Pain Location: right knee Pain Descriptors / Indicators: Aching;Sore;Grimacing;Guarding;Operative site guarding Pain Intervention(s): Limited activity within patient's tolerance;Monitored during session;Premedicated before session;Repositioned  VSS    Home Living Family/patient expects to be discharged to:: Private residence Living Arrangements: Spouse/significant other;Children Available Help at Discharge: Family;Available 24 hours/day (husband and daughter) Type of Home: House Home Access: Stairs to enter Entrance Stairs-Rails: Right Entrance Stairs-Number of Steps: 1/2 step Home Layout: One level Home Equipment: Walker - 2 wheels;Cane - single point;Bedside commode      Prior Function Level of Independence: Independent  Hand Dominance        Extremity/Trunk Assessment   Upper Extremity Assessment Upper Extremity Assessment: Defer to OT evaluation    Lower Extremity  Assessment Lower Extremity Assessment: RLE deficits/detail RLE Deficits / Details: grossly 2-/5 RLE: Unable to fully assess due to pain    Cervical / Trunk Assessment Cervical / Trunk Assessment: Kyphotic  Communication   Communication: No difficulties  Cognition Arousal/Alertness: Lethargic;Suspect due to medications Behavior During Therapy: Flat affect Overall Cognitive Status: Impaired/Different from baseline Area of Impairment: Safety/judgement;Following commands;Problem solving       Following Commands: Follows one step commands inconsistently;Follows one step commands with increased time Safety/Judgement: Decreased awareness of safety;Decreased awareness of deficits   Problem Solving: Difficulty sequencing;Requires verbal cues;Requires tactile cues;Decreased initiation;Slow processing      General Comments      Exercises Total Joint Exercises Ankle Circles/Pumps: AAROM;Both;10 reps;Supine Quad Sets: AAROM;Both;10 reps;Supine Goniometric ROM: did not measure due to pt lethargy   Assessment/Plan    PT Assessment Patient needs continued PT services  PT Problem List Decreased activity tolerance;Decreased balance;Decreased strength;Decreased range of motion;Decreased mobility;Decreased knowledge of use of DME;Decreased cognition;Decreased safety awareness;Decreased knowledge of precautions;Pain          PT Treatment Interventions DME instruction;Gait training;Stair training;Functional mobility training;Therapeutic activities;Therapeutic exercise;Balance training;Patient/family education    PT Goals (Current goals can be found in the Care Plan section)  Acute Rehab PT Goals Patient Stated Goal: to go home PT Goal Formulation: With patient Time For Goal Achievement: 11/28/16 Potential to Achieve Goals: Good    Frequency 7X/week   Barriers to discharge        Co-evaluation               End of Session Equipment Utilized During Treatment: Gait  belt;Oxygen Activity Tolerance: Patient limited by fatigue;Patient limited by pain;Patient limited by lethargy Patient left: in chair;with call bell/phone within reach;with family/visitor present Nurse Communication: Mobility status         Time: 1501-1530 PT Time Calculation (min) (ACUTE ONLY): 29 min   Charges:   PT Evaluation $PT Eval Moderate Complexity: 1 Procedure PT Treatments $Therapeutic Exercise: 8-22 mins   PT G Codes:        Berline LopesDawn F Jearldean Gutt 11/21/2016, 4:30 PM Guilherme Schwenke,PT Acute Rehabilitation [phone] (pager)

## 2016-11-21 NOTE — Anesthesia Procedure Notes (Signed)
Spinal  Patient location during procedure: OR Start time: 11/21/2016 7:18 AM End time: 11/21/2016 7:23 AM Staffing Anesthesiologist: Cecile HearingURK, Kassidie Hendriks EDWARD Performed: anesthesiologist  Preanesthetic Checklist Completed: patient identified, surgical consent, pre-op evaluation, timeout performed, IV checked, risks and benefits discussed and monitors and equipment checked Spinal Block Patient position: sitting Prep: site prepped and draped and DuraPrep Patient monitoring: continuous pulse ox and blood pressure Approach: midline Location: L3-4 Needle Needle type: Pencan  Needle gauge: 24 G Needle length: 9 cm Assessment Sensory level: T8 Additional Notes Functioning IV was confirmed and monitors were applied. Sterile prep and drape, including hand hygiene, mask and sterile gloves were used. The patient was positioned and the spine was prepped. The skin was anesthetized with lidocaine.  Free flow of clear CSF was obtained prior to injecting local anesthetic into the CSF.  The spinal needle aspirated freely following injection.  The needle was carefully withdrawn.  The patient tolerated the procedure well. Consent was obtained prior to procedure with all questions answered and concerns addressed. Risks including but not limited to bleeding, infection, nerve damage, paralysis, failed block, inadequate analgesia, allergic reaction, high spinal, itching and headache were discussed and the patient wished to proceed.   Bianca AranStephen Yon Schiffman, MD

## 2016-11-21 NOTE — Interval H&P Note (Signed)
.  H&P done

## 2016-11-21 NOTE — Transfer of Care (Signed)
Immediate Anesthesia Transfer of Care Note  Patient: Bianca Campbell  Procedure(s) Performed: Procedure(s): RIGHT TOTAL KNEE ARTHROPLASTY (Right)  Patient Location: PACU  Anesthesia Type:Spinal  Level of Consciousness: awake, alert  and oriented  Airway & Oxygen Therapy: Patient Spontanous Breathing  Post-op Assessment: Report given to RN, Post -op Vital signs reviewed and stable and Patient moving all extremities X 4  Post vital signs: Reviewed and stable  Last Vitals:  Vitals:   11/21/16 0614 11/21/16 0953  BP: (!) 165/83   Pulse: 85 96  Resp: 20 17  Temp: 37 C 36.5 C    Last Pain:  Vitals:   11/21/16 0614  TempSrc: Oral  PainSc:       Patients Stated Pain Goal: 3 (11/21/16 0554)  Complications: No apparent anesthesia complications

## 2016-11-21 NOTE — Anesthesia Procedure Notes (Signed)
Procedure Name: MAC Date/Time: 11/21/2016 7:20 AM Performed by: Neldon Newport Pre-anesthesia Checklist: Timeout performed, Patient being monitored, Suction available, Emergency Drugs available and Patient identified Patient Re-evaluated:Patient Re-evaluated prior to inductionOxygen Delivery Method: Simple face mask

## 2016-11-21 NOTE — Op Note (Signed)
MRN:     [id number] DOB/AGE:    60-Sep-1957 / 60 y.o.       OPERATIVE REPORT    DATE OF PROCEDURE:  11/21/2016       PREOPERATIVE DIAGNOSIS:   PRIMARY LOCALIZED OSTEOARTHRITIS RIGHT KNEE      Estimated body mass index is 46.19 kg/m as calculated from the following:   Height as of this encounter: 4\' 10"  (1.473 m).   Weight as of this encounter: 221 lb (100.2 kg).                                                        POSTOPERATIVE DIAGNOSIS:   SAME                                                                      PROCEDURE:  Procedure(s): RIGHT TOTAL KNEE ARTHROPLASTY Using Depuy Attune RP implants #2 Femur, #3Tibia, 5mm  RP bearing, 29 Patella     SURGEON: Gracelee Stemmler A    ASSISTANT:  Kirstin Shepperson PA-C   (Present and scrubbed throughout the case, critical for assistance with exposure, retraction, instrumentation, and closure.)         ANESTHESIA: Spinal with Adductor Nerve Block     TOURNIQUET TIME: 90min   COMPLICATIONS:  None     SPECIMENS: None   INDICATIONS FOR PROCEDURE: The patient has  OSTEOARTHRITIS RIGHT KNEE, varus deformities, XR shows bone on bone arthritis. Patient has failed all conservative measures including anti-inflammatory medicines, narcotics, attempts at  exercise and weight loss, cortisone injections and viscosupplementation.  Risks and benefits of surgery have been discussed, questions answered.   DESCRIPTION OF PROCEDURE: The patient identified by armband, received  right femoral nerve block and IV antibiotics, [facility] Patient taken to the operating room, appropriate anesthetic  monitors were attached Spinal anesthesia induced with  the patient in supine position, Foley catheter was inserted. Tourniquet  applied high to the operative thigh. Lateral post and foot positioner  applied to the table, the lower extremity was then prepped and draped  in usual sterile fashion from the ankle to the tourniquet. Time-out  procedure was performed. The limb was wrapped with an Esmarch bandage and the tourniquet inflated to 365 mmHg. We began the operation by making the anterior midline incision starting at handbreadth above the patella going over the patella 1 cm medial to and  4 cm distal to the tibial tubercle. Small bleeders in the skin and the  subcutaneous tissue identified and cauterized. Transverse retinaculum was incised and reflected medially and a medial parapatellar arthrotomy was accomplished. the patella was everted and theprepatellar fat pad resected. The superficial medial collateral  ligament was then elevated from anterior to posterior along the proximal  flare of the tibia and anterior half of the menisci resected. The knee was hyperflexed exposing bone on bone arthritis. Peripheral and notch osteophytes as well as the cruciate ligaments were then resected. We continued to  work our way around posteriorly along the proximal tibia, and externally  rotated the tibia subluxing it  out from underneath the femur. A McHale  retractor was placed through the notch and a lateral Hohmann retractor  placed, and we then drilled through the proximal tibia in line with the  axis of the tibia followed by an intramedullary guide rod and 2-degree  posterior slope cutting guide. The tibial cutting guide was pinned into place  allowing resection of 4 mm of bone medially and about 6 mm of bone  laterally because of her varus deformity. Satisfied with the tibial resection, we then  entered the distal femur 2 mm anterior to the PCL origin with the  intramedullary guide rod and applied the distal femoral cutting guide  set at 11mm, with 5 degrees of valgus. This was pinned along the  epicondylar axis. At this point, the distal femoral cut was accomplished without difficulty. We then sized for a #2 femoral component and pinned the guide in 3 degrees of external rotation.The chamfer cutting guide was pinned into place. The  anterior, posterior, and chamfer cuts were accomplished without difficulty followed by  the  RP box cutting guide and the box cut. We also removed posterior osteophytes from the posterior femoral condyles. At this  time, the knee was brought into full extension. We checked our  extension and flexion gaps and found them symmetric at 5mm.  The patella thickness measured at 24 mm. We set the cutting guide at 15 and removed the posterior 9.5-10 mm  of the patella sized for 29 button and drilled the lollipop. The knee  was then once again hyperflexed exposing the proximal tibia. We sized for a #3 tibial base plate, applied the smokestack and the conical reamer followed by the the Delta fin keel punch. We then hammered into place the  RP trial femoral component, inserted a 1 trial bearing, trial patellar button, and took the knee through range of motion from 0-130 degrees. No thumb pressure was required for patellar  tracking. At this point, all trial components were removed, a double batch of DePuy HV cement  was mixed and applied to all bony metallic mating surfaces except for the posterior condyles of the femur itself. In order, we  hammered into place the tibial tray and removed excess cement, the femoral component and removed excess cement, a 5mm  RP bearing  was inserted, and the knee brought to full extension with compression.  The patellar button was clamped into place, and excess cement  removed. While the cement cured the wound was irrigated out with normal saline solution pulse lavage.. Ligament stability and patellar tracking were checked and found to be excellent.. The parapatellar arthrotomy was closed with  #1 Vicryl suture. The subcutaneous tissue with 0 and 2-0 undyed  Vicryl suture, and 4-0 Monocryl.. A dressing of Aquaseal,  4 x 4, dressing sponges, Webril, and Ace wrap applied. Needle and sponge count were correct times 2.The patient awakened, extubated, and taken to recovery room without  difficulty. Vascular status was normal, pulses 2+ and symmetric.   Briunna Leicht A 11/21/2016, 9:22 AM

## 2016-11-21 NOTE — Anesthesia Procedure Notes (Signed)
Anesthesia Regional Block:  Adductor canal block  Pre-Anesthetic Checklist: ,, timeout performed, Correct Patient, Correct Site, Correct Laterality, Correct Procedure, Correct Position, site marked, Risks and benefits discussed,  Surgical consent,  Pre-op evaluation,  At surgeon's request and post-op pain management  Laterality: Right  Prep: chloraprep       Needles:  Injection technique: Single-shot  Needle Type: Echogenic Needle     Needle Length: 9cm 9 cm Needle Gauge: 21 and 21 G    Additional Needles:  Procedures: ultrasound guided (picture in chart) Adductor canal block Narrative:  Start time: 11/21/2016 6:56 AM End time: 11/21/2016 7:01 AM Injection made incrementally with aspirations every 5 mL.  Performed by: Personally  Anesthesiologist: Cecile HearingURK, Crystelle Ferrufino EDWARD  Additional Notes: No pain on injection. No increased resistance to injection. Injection made in 5cc increments.  Good needle visualization.  Patient tolerated procedure well.

## 2016-11-21 NOTE — Progress Notes (Signed)
Orthopedic Tech Progress Note Patient Details:  Bianca BoxerLinda P Campbell Oct 18, 1955 [id number]  Ortho Devices Type of Ortho Device: Knee Immobilizer Ortho Device/Splint Interventions: Application   Saul FordyceJennifer C Jenah Vanasten 11/21/2016, 11:39 AM

## 2016-11-21 NOTE — Anesthesia Postprocedure Evaluation (Signed)
Anesthesia Post Note  Patient: Charlann BoxerLinda P Comrie  Procedure(s) Performed: Procedure(s) (LRB): RIGHT TOTAL KNEE ARTHROPLASTY (Right)  Patient location during evaluation: PACU Anesthesia Type: Spinal Level of consciousness: oriented and awake and alert Pain management: pain level controlled Vital Signs Assessment: post-procedure vital signs reviewed and stable Respiratory status: spontaneous breathing, respiratory function stable and patient connected to nasal cannula oxygen Cardiovascular status: blood pressure returned to baseline and stable Postop Assessment: no headache, no backache, spinal receding, no signs of nausea or vomiting and patient able to bend at knees Anesthetic complications: no       Last Vitals:  Vitals:   11/21/16 1136 11/21/16 1159  BP: 126/75 131/73  Pulse: 87 94  Resp: 13 15  Temp: 36.4 C 36.6 C    Last Pain:  Vitals:   11/21/16 1430  TempSrc:   PainSc: 8                  Cecile HearingStephen Edward Turk

## 2016-11-22 ENCOUNTER — Encounter (HOSPITAL_COMMUNITY): Payer: Self-pay | Admitting: Orthopedic Surgery

## 2016-11-22 LAB — CBC
HCT: 33.6 % — ABNORMAL LOW (ref 36.0–46.0)
HEMOGLOBIN: 10.7 g/dL — AB (ref 12.0–15.0)
MCH: 26.2 pg (ref 26.0–34.0)
MCHC: 31.8 g/dL (ref 30.0–36.0)
MCV: 82.4 fL (ref 78.0–100.0)
Platelets: 278 10*3/uL (ref 150–400)
RBC: 4.08 MIL/uL (ref 3.87–5.11)
RDW: 13.5 % (ref 11.5–15.5)
WBC: 8.3 10*3/uL (ref 4.0–10.5)

## 2016-11-22 LAB — BASIC METABOLIC PANEL
ANION GAP: 9 (ref 5–15)
BUN: 11 mg/dL (ref 6–20)
CHLORIDE: 106 mmol/L (ref 101–111)
CO2: 23 mmol/L (ref 22–32)
Calcium: 9 mg/dL (ref 8.9–10.3)
Creatinine, Ser: 0.62 mg/dL (ref 0.44–1.00)
GFR calc Af Amer: >60 mL/min (ref >60)
Glucose, Bld: 153 mg/dL — ABNORMAL HIGH (ref 65–99)
POTASSIUM: 4.9 mmol/L (ref 3.5–5.1)
SODIUM: 138 mmol/L (ref 135–145)

## 2016-11-22 LAB — GLUCOSE, CAPILLARY
GLUCOSE-CAPILLARY: 196 mg/dL — AB (ref 65–99)
GLUCOSE-CAPILLARY: 190 mg/dL — AB (ref 65–99)
GLUCOSE-CAPILLARY: 170 mg/dL — AB (ref 65–99)
Glucose-Capillary: 169 mg/dL — ABNORMAL HIGH (ref 65–99)

## 2016-11-22 LAB — HEMOGLOBIN A1C
Hgb A1c MFr Bld: 6.3 % — ABNORMAL HIGH (ref 4.8–5.6)
Mean Plasma Glucose: 134 mg/dL

## 2016-11-22 NOTE — Progress Notes (Signed)
Physical Therapy Treatment Patient Details Name: Bianca Campbell Batchelder MRN: [id number] DOB: 1956-06-02 Today's Date: 11/22/2016    History of Present Illness  Bianca Campbell Shahin, 61 y.o. female, has a history of pain and functional disability in the right knee due to arthritis and has failed non-surgical conservative treatments for greater than 12 weeks to includeNSAID's and/or analgesics, corticosteriod injections, viscosupplementation injections, flexibility and strengthening excercises, supervised PT with diminished ADL's post treatment, weight reduction as appropriate and activity modification.    Right TKA with WBAT with knee immobilizer.      PT Comments    Patient able to increase gait distance and practiced step. Pt needs to do rest of HEP next session-covered first page today. Current plan remains appropriate.   Follow Up Recommendations  Home health PT;Supervision/Assistance - 24 hour     Equipment Recommendations  None recommended by PT    Recommendations for Other Services       Precautions / Restrictions Precautions Precautions: Fall Required Braces or Orthoses: Knee Immobilizer - Right Knee Immobilizer - Right: On at all times;Other (comment) Restrictions Weight Bearing Restrictions: Yes RLE Weight Bearing: Weight bearing as tolerated    Mobility  Bed Mobility Overal bed mobility: Needs Assistance Bed Mobility: Sit to Supine     Supine to sit: Min guard Sit to supine: Min assist   General bed mobility comments: assist to bring R LE into bed  Transfers Overall transfer level: Needs assistance Equipment used: Rolling walker (2 wheeled) Transfers: Sit to/from Stand Sit to Stand: Min guard         General transfer comment: safe hand placement and technique  Ambulation/Gait Ambulation/Gait assistance: Min guard Ambulation Distance (Feet): 120 Feet Assistive device: Rolling walker (2 wheeled) Gait Pattern/deviations: Step-through pattern;Decreased stance time  - right;Decreased weight shift to right;Decreased step length - left Gait velocity: decreased   General Gait Details: cues for R knee flexion during swing phase and proximity of RW   Stairs Stairs: Yes   Stair Management: No rails;Backwards;With walker Number of Stairs: 1 (single step to enter home) General stair comments: cues for sequencing and technique  Wheelchair Mobility    Modified Rankin (Stroke Patients Only)       Balance Overall balance assessment: Needs assistance Sitting-balance support: Feet supported Sitting balance-Leahy Scale: Good     Standing balance support: Bilateral upper extremity supported;During functional activity Standing balance-Leahy Scale: Fair Standing balance comment: able to static stand without UE support                    Cognition Arousal/Alertness: Awake/alert Behavior During Therapy: WFL for tasks assessed/performed Overall Cognitive Status: Within Functional Limits for tasks assessed                      Exercises Total Joint Exercises Quad Sets: Both;10 reps;AROM Heel Slides: AAROM;Right;5 reps;Supine Goniometric ROM: 5-80    General Comments General comments (skin integrity, edema, etc.): husband present most of session      Pertinent Vitals/Pain Pain Assessment: Faces Faces Pain Scale: Hurts little more Pain Location: right knee Pain Descriptors / Indicators: Sore;Grimacing;Guarding Pain Intervention(s): Limited activity within patient's tolerance;Monitored during session;Premedicated before session;Repositioned    Home Living                      Prior Function            PT Goals (current goals can now be found in the care plan section) Acute  Rehab PT Goals Patient Stated Goal: to go home PT Goal Formulation: With patient Time For Goal Achievement: 11/28/16 Potential to Achieve Goals: Good Progress towards PT goals: Progressing toward goals    Frequency    7X/week      PT  Plan Current plan remains appropriate    Co-evaluation             End of Session Equipment Utilized During Treatment: Gait belt Activity Tolerance: Patient tolerated treatment well Patient left: with call bell/phone within reach;in bed;with SCD's reapplied     Time: 9604-5409 PT Time Calculation (min) (ACUTE ONLY): 31 min  Charges:  $Gait Training: 8-22 mins $Therapeutic Activity: 8-22 mins                    G Codes:      Derek Mound, PTA Pager: [phone]   11/22/2016, 3:49 PM

## 2016-11-22 NOTE — Evaluation (Signed)
Occupational Therapy Evaluation Patient Details Name: Bianca BoxerLinda P Campbell MRN: [id number] DOB: 1956-03-10 Today's Date: 11/22/2016    History of Present Illness  Bianca Campbell, 61 y.o. female, has a history of pain and functional disability in the right knee due to arthritis and has failed non-surgical conservative treatments for greater than 12 weeks to includeNSAID's and/or analgesics, corticosteriod injections, viscosupplementation injections, flexibility and strengthening excercises, supervised PT with diminished ADL's post treatment, weight reduction as appropriate and activity modification.    Right TKA with WBAT with knee immobilizer.     Clinical Impression   Pt with decline in function and safety with ADLs and ADL mobility with decreased balance and activity tolerance. Pt would benefit from acute OT services to address impairments to increase level of function and safety     Follow Up Recommendations  No OT follow up    Equipment Recommendations  Tub/shower bench;Other (comment);3 in 1 bedside commode Lexicographer(reacher)    Recommendations for Other Services       Precautions / Restrictions Precautions Precautions: Fall Required Braces or Orthoses: Knee Immobilizer - Right Restrictions Weight Bearing Restrictions: Yes RLE Weight Bearing: Weight bearing as tolerated      Mobility Bed Mobility Overal bed mobility: Needs Assistance Bed Mobility: Sit to Supine       Sit to supine: Mod assist   General bed mobility comments: mod A with R LE onto bed. Pt able to [facility] to [facility] without physical assist  Transfers Overall transfer level: Needs assistance Equipment used: Rolling walker (2 wheeled) Transfers: Sit to/from UGI CorporationStand;Stand Pivot Transfers Sit to Stand: Mod assist Stand pivot transfers: Min assist            Balance Overall balance assessment: Needs assistance Sitting-balance support: Bilateral upper extremity supported;Feet supported Sitting balance-Leahy Scale:  Fair     Standing balance support: Bilateral upper extremity supported;During functional activity Standing balance-Leahy Scale: Poor                              ADL Overall ADL's : Needs assistance/impaired     Grooming: Wash/dry hands;Wash/dry face;Min guard;Standing   Upper Body Bathing: Min guard;Sitting   Lower Body Bathing: Moderate assistance   Upper Body Dressing : Min guard;Sitting   Lower Body Dressing: Maximal assistance   Toilet Transfer: Moderate assistance;Minimal assistance;Stand-pivot;BSC;Ambulation;RW   Toileting- Clothing Manipulation and Hygiene: Moderate assistance       Functional mobility during ADLs: Moderate assistance;Minimal assistance;Rolling walker       Vision Vision Assessment?: No apparent visual deficits              Pertinent Vitals/Pain Pain Assessment: 0-10 Pain Score: 7  Pain Location: R knee Pain Descriptors / Indicators: Aching;Sore;Guarding Pain Intervention(s): Limited activity within patient's tolerance;Monitored during session;Premedicated before session;Repositioned     Hand Dominance Right   Extremity/Trunk Assessment Upper Extremity Assessment Upper Extremity Assessment: Overall WFL for tasks assessed   Lower Extremity Assessment Lower Extremity Assessment: Defer to PT evaluation       Communication Communication Communication: No difficulties   Cognition Arousal/Alertness: Awake/alert Behavior During Therapy: WFL for tasks assessed/performed Overall Cognitive Status: Within Functional Limits for tasks assessed                     General Comments   pt very pleasant and cooperative                 Home Living Family/patient expects  to be discharged to:: Private residence Living Arrangements: Spouse/significant other;Children Available Help at Discharge: Family;Available 24 hours/day Type of Home: House Home Access: Stairs to enter [employer] of Steps: 1/2  step Entrance Stairs-Rails: Right Home Layout: One level     Bathroom Shower/Tub: Chief Strategy Officer: Handicapped height     Home Equipment: Environmental consultant - 2 wheels;Cane - single point;Bedside commode          Prior Functioning/Environment Level of Independence: Independent                 OT Problem List: Impaired balance (sitting and/or standing);Pain;Decreased activity tolerance;Decreased knowledge of use of DME or AE;Obesity   OT Treatment/Interventions: Self-care/ADL training;DME and/or AE instruction;Therapeutic activities;Patient/family education    OT Goals(Current goals can be found in the care plan section) Acute Rehab OT Goals Patient Stated Goal: to go home OT Goal Formulation: With patient Time For Goal Achievement: 11/29/16 Potential to Achieve Goals: Good ADL Goals Pt Will Perform Grooming: with supervision;standing;with set-up Pt Will Perform Upper Body Bathing: with set-up;sitting Pt Will Perform Lower Body Bathing: with min assist;with caregiver independent in assisting;sitting/lateral leans;sit to/from stand Pt Will Perform Upper Body Dressing: with set-up;sitting Pt Will Perform Lower Body Dressing: with mod assist;with caregiver independent in assisting;sitting/lateral leans;sit to/from stand Pt Will Transfer to Toilet: with min assist;ambulating Pt Will Perform Toileting - Clothing Manipulation and hygiene: with min assist;with caregiver independent in assisting;sitting/lateral leans;sit to/from stand Pt Will Perform Tub/Shower Transfer: 3 in 1;tub bench;rolling walker;ambulating;with caregiver independent in assisting;with min assist  OT Frequency: Min 2X/week   Barriers to D/C:    no barriers                     End of Session Equipment Utilized During Treatment: Gait belt;Rolling walker;Other (comment);Right knee immobilizer (BSC) CPM Right Knee CPM Right Knee: Off Additional Comments: bone foam  Activity Tolerance:  Patient tolerated treatment well Patient left: in bed;with call bell/phone within reach   Time: 1610-9604 OT Time Calculation (min): 33 min Charges:  OT General Charges $OT Visit: 1 Procedure OT Evaluation $OT Eval Low Complexity: 1 Procedure OT Treatments $Self Care/Home Management : 8-22 mins G-Codes:    Galen Manila 11/22/2016, 10:02 AM

## 2016-11-22 NOTE — Progress Notes (Signed)
Orthopedic Tech Progress Note Patient Details:  Bianca BoxerLinda P Campbell [birth year] [id number]  CPM Right Knee CPM Right Knee: On Right Knee Flexion (Degrees): 90 Right Knee Extension (Degrees): 0 Additional Comments: bone foam   Saul FordyceJennifer C Bianca Campbell 11/22/2016, 6:39 PM

## 2016-11-22 NOTE — Progress Notes (Signed)
Physical Therapy Treatment Patient Details Name: Bianca BoxerLinda P Campbell MRN: [id number] DOB: 04-30-1956 Today's Date: 11/22/2016    History of Present Illness  Bianca Campbell, 61 y.o. female, has a history of pain and functional disability in the right knee due to arthritis and has failed non-surgical conservative treatments for greater than 12 weeks to includeNSAID's and/or analgesics, corticosteriod injections, viscosupplementation injections, flexibility and strengthening excercises, supervised PT with diminished ADL's post treatment, weight reduction as appropriate and activity modification.    Right TKA with WBAT with knee immobilizer.      PT Comments    Patient is progressing toward mobility goals and tolerated increased gait distance and therex this session. Continue to progress as tolerated with anticipated d/c home with HHPT.   Follow Up Recommendations  Home health PT;Supervision/Assistance - 24 hour     Equipment Recommendations  None recommended by PT    Recommendations for Other Services       Precautions / Restrictions Precautions Precautions: Fall Required Braces or Orthoses: Knee Immobilizer - Right Knee Immobilizer - Right: On at all times Restrictions Weight Bearing Restrictions: Yes RLE Weight Bearing: Weight bearing as tolerated    Mobility  Bed Mobility Overal bed mobility: Needs Assistance Bed Mobility: Supine to Sit     Supine to sit: Min guard Sit to supine: Mod assist   General bed mobility comments: min guard for safety; cues for technique  Transfers Overall transfer level: Needs assistance Equipment used: Rolling walker (2 wheeled) Transfers: Sit to/from Stand Sit to Stand: Min guard Stand pivot transfers: Min assist       General transfer comment: min guard for safety; cues for hand placement  Ambulation/Gait Ambulation/Gait assistance: Min guard Ambulation Distance (Feet): 80 Feet Assistive device: Rolling walker (2 wheeled) Gait  Pattern/deviations: Step-through pattern;Decreased stance time - right;Decreased step length - left;Decreased weight shift to right Gait velocity: decreased   General Gait Details: slow, steady gait; cues for sequencing, safe use of AD, and posture; worked on step through pattern with improved WB and step length symmetry with increased distance   Careers information officertairs            Wheelchair Mobility    Modified Rankin (Stroke Patients Only)       Balance Overall balance assessment: Needs assistance Sitting-balance support: Feet supported Sitting balance-Leahy Scale: Good     Standing balance support: Bilateral upper extremity supported;During functional activity Standing balance-Leahy Scale: Fair Standing balance comment: able to static stand without UE support                    Cognition Arousal/Alertness: Awake/alert Behavior During Therapy: WFL for tasks assessed/performed Overall Cognitive Status: Within Functional Limits for tasks assessed                      Exercises Total Joint Exercises Quad Sets: Both;10 reps;AROM Heel Slides: AAROM;Right;10 reps Goniometric ROM: 5-80    General Comments        Pertinent Vitals/Pain Pain Assessment: Faces Pain Score: 7  Faces Pain Scale: Hurts little more Pain Location: right knee Pain Descriptors / Indicators: Sore;Grimacing;Guarding Pain Intervention(s): Limited activity within patient's tolerance;Monitored during session;Premedicated before session;Repositioned    Home Living Family/patient expects to be discharged to:: Private residence Living Arrangements: Spouse/significant other;Children Available Help at Discharge: Family;Available 24 hours/day Type of Home: House Home Access: Stairs to enter Entrance Stairs-Rails: Right Home Layout: One level Home Equipment: Walker - 2 wheels;Cane - single point;Bedside commode  Prior Function Level of Independence: Independent          PT Goals (current  goals can now be found in the care plan section) Acute Rehab PT Goals Patient Stated Goal: to go home PT Goal Formulation: With patient Time For Goal Achievement: 11/28/16 Potential to Achieve Goals: Good Progress towards PT goals: Progressing toward goals    Frequency    7X/week      PT Plan Current plan remains appropriate    Co-evaluation             End of Session Equipment Utilized During Treatment: Gait belt Activity Tolerance: Patient tolerated treatment well Patient left: in chair;with call bell/phone within reach     Time: 1101-1146 PT Time Calculation (min) (ACUTE ONLY): 45 min  Charges:  $Gait Training: 8-22 mins $Therapeutic Exercise: 8-22 mins                    G Codes:      Derek Mound, PTA Pager: [phone]   11/22/2016, 12:56 PM

## 2016-11-22 NOTE — Progress Notes (Signed)
Subjective: 1 Day Post-Op Procedure(s) (LRB): RIGHT TOTAL KNEE ARTHROPLASTY (Right) Patient reports pain as 7 on 0-10 scale.    Objective: Vital signs in last 24 hours: Temp:  [97.5 F (36.4 C)-98.5 F (36.9 C)] 98.2 F (36.8 C) (02/06 0500) Pulse Rate:  [71-96] 75 (02/06 0500) Resp:  [12-18] 18 (02/06 0500) BP: (113-133)/(64-88) 124/67 (02/06 0500) SpO2:  [94 %-100 %] 100 % (02/06 0500)  Intake/Output from previous day: 02/05 0701 - 02/06 0700 In: 1775 [P.O.:30; I.V.:1585; IV Piggyback:160] Out: 2810 [Urine:2800; Blood:10] Intake/Output this shift: No intake/output data recorded.   Recent Labs  11/21/16 1205 11/22/16 0444  HGB 12.5 10.7*    Recent Labs  11/21/16 1205 11/22/16 0444  WBC 6.8 8.3  RBC 4.72 4.08  HCT 38.5 33.6*  PLT 283 278    Recent Labs  11/21/16 1205 11/22/16 0444  NA  --  138  K  --  4.9  CL  --  106  CO2  --  23  BUN  --  11  CREATININE 0.64 0.62  GLUCOSE  --  153*  CALCIUM  --  9.0   No results for input(s): LABPT, INR in the last 72 hours.  ABD soft Neurovascular intact Sensation intact distally Intact pulses distally Dorsiflexion/Plantar flexion intact Incision: dressing C/D/I  Assessment/Plan: 1 Day Post-Op Procedure(s) (LRB): RIGHT TOTAL KNEE ARTHROPLASTY (Right)  Principal Problem:   Primary localized osteoarthritis of right knee Active Problems:   Gout   Essential hypertension   GERD   Irritable bowel syndrome   Low back pain   Impaired glucose tolerance   Hypercalcemia  Advance diet Up with therapy D/C IV fluids Plan for discharge tomorrow Discharge home with home health  Patient got out of bed with the assist of side rails.  She ambulated from bed to chair.  She is having difficulty with pain control.  I encouraged zero degree knee.  0-90 on CPM and ambulation.  Seanpaul Preece J 11/22/2016, 8:30 AM

## 2016-11-23 ENCOUNTER — Encounter (HOSPITAL_COMMUNITY): Payer: Self-pay | Admitting: General Practice

## 2016-11-23 LAB — GLUCOSE, CAPILLARY
GLUCOSE-CAPILLARY: 157 mg/dL — AB (ref 65–99)
GLUCOSE-CAPILLARY: 129 mg/dL — AB (ref 65–99)
GLUCOSE-CAPILLARY: 168 mg/dL — AB (ref 65–99)
Glucose-Capillary: 123 mg/dL — ABNORMAL HIGH (ref 65–99)
Glucose-Capillary: 123 mg/dL — ABNORMAL HIGH (ref 65–99)

## 2016-11-23 LAB — CBC
HEMATOCRIT: 31.8 % — AB (ref 36.0–46.0)
Hemoglobin: 10.2 g/dL — ABNORMAL LOW (ref 12.0–15.0)
MCH: 26.4 pg (ref 26.0–34.0)
MCHC: 32.1 g/dL (ref 30.0–36.0)
MCV: 82.4 fL (ref 78.0–100.0)
Platelets: 246 10*3/uL (ref 150–400)
RBC: 3.86 MIL/uL — ABNORMAL LOW (ref 3.87–5.11)
RDW: 13.5 % (ref 11.5–15.5)
WBC: 10.8 10*3/uL — ABNORMAL HIGH (ref 4.0–10.5)

## 2016-11-23 LAB — BASIC METABOLIC PANEL
ANION GAP: 9 (ref 5–15)
BUN: 10 mg/dL (ref 6–20)
CALCIUM: 9.1 mg/dL (ref 8.9–10.3)
CO2: 27 mmol/L (ref 22–32)
CREATININE: 0.51 mg/dL (ref 0.44–1.00)
Chloride: 102 mmol/L (ref 101–111)
Glucose, Bld: 152 mg/dL — ABNORMAL HIGH (ref 65–99)
Potassium: 3.9 mmol/L (ref 3.5–5.1)
Sodium: 138 mmol/L (ref 135–145)

## 2016-11-23 MED ORDER — OXYCODONE HCL 5 MG PO TABS: 15.0000 mg | ORAL_TABLET | ORAL | Status: DC | PRN

## 2016-11-23 MED ORDER — OXYCODONE HCL 5 MG PO TABS
5.0000 mg | ORAL_TABLET | ORAL | Status: DC | PRN
  Administered 2016-11-23 – 2016-11-24 (×3): 10 mg via ORAL
  Filled 2016-11-23 (×3): qty 2

## 2016-11-23 MED ORDER — OXYCODONE HCL ER 20 MG PO T12A
20.0000 mg | EXTENDED_RELEASE_TABLET | Freq: Two times a day (BID) | ORAL | Status: DC
  Administered 2016-11-23 – 2016-11-24 (×2): 20 mg via ORAL
  Filled 2016-11-23 (×2): qty 1

## 2016-11-23 MED ORDER — CELECOXIB 200 MG PO CAPS
200.0000 mg | ORAL_CAPSULE | Freq: Every day | ORAL | Status: DC
  Administered 2016-11-24: 200 mg via ORAL
  Filled 2016-11-23: qty 1

## 2016-11-23 NOTE — Progress Notes (Signed)
Occupational Therapy Treatment Patient Details Name: Bianca Campbell MRN: [id number] DOB: 1956/07/06 Today's Date: 11/23/2016    History of present illness  Bianca Campbell, 61 y.o. female, has a history of pain and functional disability in the right knee due to arthritis and has failed non-surgical conservative treatments for greater than 12 weeks to includeNSAID's and/or analgesics, corticosteriod injections, viscosupplementation injections, flexibility and strengthening excercises, supervised PT with diminished ADL's post treatment, weight reduction as appropriate and activity modification.    Right TKA with WBAT with knee immobilizer.     OT comments  Pt making progress with functional goals and scheduled to d/c home this afternoon  Follow Up Recommendations  No OT follow up;Supervision - Intermittent    Equipment Recommendations  Tub/shower bench;Other (comment);3 in 1 bedside commode Lexicographer)    Recommendations for Other Services      Precautions / Restrictions Precautions Precautions: Fall Restrictions Weight Bearing Restrictions: Yes RLE Weight Bearing: Weight bearing as tolerated       Mobility Bed Mobility Overal bed mobility: Needs Assistance Bed Mobility: Sit to Supine     Supine to sit: Supervision        Transfers Overall transfer level: Needs assistance Equipment used: Rolling walker (2 wheeled) Transfers: Sit to/from Stand   Stand pivot transfers: Min guard            Balance Overall balance assessment: Needs assistance Sitting-balance support: Feet supported       Standing balance support: Bilateral upper extremity supported;During functional activity Standing balance-Leahy Scale: Fair                     ADL       Grooming: Wash/dry hands;Wash/dry face;Standing;Set up;Supervision/safety   Upper Body Bathing: Set up;Sitting (simulated)   Lower Body Bathing: Moderate assistance   Upper Body Dressing : Set up;Sitting    Lower Body Dressing: Moderate assistance;Maximal assistance   Toilet Transfer: Ambulation;RW;Comfort height toilet;Min guard   Toileting- Clothing Manipulation and Hygiene: Minimal assistance;Sit to/from stand   Tub/ Shower Transfer: 3 in 1;Rolling walker;Ambulation;Min guard   Functional mobility during ADLs: Rolling walker;Min guard                                        Cognition   Behavior During Therapy: WFL for tasks assessed/performed Overall Cognitive Status: Within Functional Limits for tasks assessed                       Extremity/Trunk Assessment   WFL                        General Comments  Pt pleasant and cooperative    Pertinent Vitals/ Pain       Faces Pain Scale: Hurts whole lot Pain Location: R knee with mobility Pain Descriptors / Indicators: Aching;Grimacing;Guarding;Moaning;Sore Pain Intervention(s): Limited activity within patient's tolerance;Monitored during session;Repositioned;Patient requesting pain meds-RN notified;Ice applied                                                          Frequency  Min 2X/week        Progress Toward Goals  OT Goals(current goals can  now be found in the care plan section)     Acute Rehab OT Goals Patient Stated Goal: to go home  Plan Discharge plan remains appropriate                     End of Session Equipment Utilized During Treatment: Gait belt;Rolling walker;Right knee immobilizer;Other (comment) (3 in 1 over toilet)   Activity Tolerance Patient limited by pain   Patient Left with call bell/phone within reach;in chair   Nurse Communication  Pain meds requested        Time: [id number] OT Time Calculation (min): 20 min  Charges: OT General Charges $OT Visit: 1 Procedure OT Treatments $Self Care/Home Management : 8-22 mins  Galen ManilaSpencer, Harace Mccluney Jeanette 11/23/2016, 10:00 AM

## 2016-11-23 NOTE — Progress Notes (Signed)
Physical Therapy Treatment Patient Details Name: Bianca BoxerLinda P Maiolo MRN: [id number] DOB: 30-Jul-1956 Today's Date: 11/23/2016    History of Present Illness  Bianca Campbell, 61 y.o. female, has a history of pain and functional disability in the right knee due to arthritis and has failed non-surgical conservative treatments for greater than 12 weeks to includeNSAID's and/or analgesics, corticosteriod injections, viscosupplementation injections, flexibility and strengthening excercises, supervised PT with diminished ADL's post treatment, weight reduction as appropriate and activity modification.    Right TKA with WBAT with knee immobilizer.      PT Comments    Patient was limited by pain this session however was able to increase gait distance. Continue to progress as tolerated with anticipated d/c home with HHPT.   Follow Up Recommendations  Home health PT;Supervision/Assistance - 24 hour     Equipment Recommendations  None recommended by PT    Recommendations for Other Services       Precautions / Restrictions Precautions Precautions: Fall Restrictions Weight Bearing Restrictions: Yes RLE Weight Bearing: Weight bearing as tolerated    Mobility  Bed Mobility Overal bed mobility: Needs Assistance Bed Mobility: Sit to Supine     Supine to sit: Supervision     General bed mobility comments: up in room with NT  Transfers Overall transfer level: Needs assistance Equipment used: Rolling walker (2 wheeled) Transfers: Sit to/from Stand Sit to Stand: Min guard Stand pivot transfers: Min guard       General transfer comment: safe hand placement and technique  Ambulation/Gait Ambulation/Gait assistance: Supervision Ambulation Distance (Feet): 165 Feet Assistive device: Rolling walker (2 wheeled) Gait Pattern/deviations: Step-through pattern;Decreased stance time - right;Decreased weight shift to right;Decreased step length - left Gait velocity: decreased   General Gait  Details: very slow, but steady gait; cues for posture, stride length, and proximity of RW; improved WB and step length symmetry with increased distance however pt did c/o increased pain    Stairs            Wheelchair Mobility    Modified Rankin (Stroke Patients Only)       Balance Overall balance assessment: Needs assistance Sitting-balance support: Feet supported Sitting balance-Leahy Scale: Good     Standing balance support: Bilateral upper extremity supported;During functional activity Standing balance-Leahy Scale: Fair Standing balance comment: able to static stand without UE support                    Cognition Arousal/Alertness: Awake/alert Behavior During Therapy: WFL for tasks assessed/performed Overall Cognitive Status: Within Functional Limits for tasks assessed                      Exercises Total Joint Exercises Quad Sets: AROM;Both;10 reps Heel Slides: AAROM;Right;10 reps Goniometric ROM:  (approx 5-90)    General Comments        Pertinent Vitals/Pain Pain Assessment: 0-10 Pain Score: 6  Faces Pain Scale: Hurts whole lot Pain Location: right knee Pain Descriptors / Indicators: Sore;Grimacing;Guarding Pain Intervention(s): Limited activity within patient's tolerance;Monitored during session;Repositioned;Patient requesting pain meds-RN notified;Ice applied    Home Living                      Prior Function            PT Goals (current goals can now be found in the care plan section) Acute Rehab PT Goals Patient Stated Goal: to go home PT Goal Formulation: With patient Time For Goal Achievement: 11/28/16  Potential to Achieve Goals: Good Progress towards PT goals: Progressing toward goals    Frequency    7X/week      PT Plan Current plan remains appropriate    Co-evaluation             End of Session Equipment Utilized During Treatment: Gait belt Activity Tolerance: Patient tolerated treatment  well Patient left: with call bell/phone within reach;in chair     Time: 1610-9604 PT Time Calculation (min) (ACUTE ONLY): 45 min  Charges:  $Gait Training: 23-37 mins $Therapeutic Exercise: 8-22 mins                    G Codes:      Derek Mound, PTA Pager: [phone]   11/23/2016, 1:31 PM

## 2016-11-23 NOTE — Progress Notes (Signed)
Physical Therapy Treatment Patient Details Name: Bianca BoxerLinda P Campbell MRN: [id number] DOB: [birth year] Today's Date: 11/23/2016    History of Present Illness  Bianca Campbell, 61 y.o. female, has a history of pain and functional disability in the right knee due to arthritis and has failed non-surgical conservative treatments for greater than 12 weeks to includeNSAID's and/or analgesics, corticosteriod injections, viscosupplementation injections, flexibility and strengthening excercises, supervised PT with diminished ADL's post treatment, weight reduction as appropriate and activity modification.    Right TKA with WBAT with knee immobilizer.      PT Comments    Patient limited by pain this session but willing to participate. Completed the rest of HEP. Husband present. Continue to progress as tolerated.   Follow Up Recommendations  Home health PT;Supervision/Assistance - 24 hour     Equipment Recommendations  None recommended by PT    Recommendations for Other Services       Precautions / Restrictions Precautions Precautions: Fall Restrictions Weight Bearing Restrictions: Yes RLE Weight Bearing: Weight bearing as tolerated    Mobility  Bed Mobility               General bed mobility comments: OOB in chair upon arrival  Transfers Overall transfer level: Needs assistance Equipment used: Rolling walker (2 wheeled) Transfers: Sit to/from Stand Sit to Stand: Min guard         General transfer comment: safe hand placement and technique  Ambulation/Gait Ambulation/Gait assistance: Supervision Ambulation Distance (Feet): 165 Feet Assistive device: Rolling walker (2 wheeled) Gait Pattern/deviations: Step-through pattern;Decreased stance time - right;Decreased weight shift to right;Decreased step length - left Gait velocity: decreased   General Gait Details: very slow, but steady gait; cues for posture, stride length, and proximity of RW; improved WB and step length symmetry  with increased distance however pt did c/o increased pain    Stairs            Wheelchair Mobility    Modified Rankin (Stroke Patients Only)       Balance Overall balance assessment: Needs assistance Sitting-balance support: Feet supported Sitting balance-Leahy Scale: Good     Standing balance support: Bilateral upper extremity supported;During functional activity Standing balance-Leahy Scale: Fair Standing balance comment: able to static stand without UE support                    Cognition Arousal/Alertness: Awake/alert Behavior During Therapy: WFL for tasks assessed/performed Overall Cognitive Status: Within Functional Limits for tasks assessed                      Exercises Total Joint Exercises Quad Sets: AROM;Both;10 reps Short Arc QuadBarbaraann Campbell: AAROM;Right;10 reps Heel Slides: AAROM;Right;10 reps Hip ABduction/ADduction: AROM;Right;10 reps Straight Leg Raises: AAROM;Right;5 reps Long Arc Quad: AROM;Right;5 reps Knee Flexion: AROM;Right;Seated;Other (comment) (2 reps 10 sec holds) Goniometric ROM:  (approx 5-90)    General Comments        Pertinent Vitals/Pain Pain Assessment: Faces Pain Score: 6  Faces Pain Scale: Hurts whole lot (with therex) Pain Location: right knee Pain Descriptors / Indicators: Sore;Grimacing;Guarding;Moaning Pain Intervention(s): Limited activity within patient's tolerance;Monitored during session;Repositioned;Patient requesting pain meds-RN notified    Home Living                      Prior Function            PT Goals (current goals can now be found in the care plan section) Acute Rehab PT Goals Patient  Stated Goal: to go home PT Goal Formulation: With patient Time For Goal Achievement: 11/28/16 Potential to Achieve Goals: Good Progress towards PT goals: Progressing toward goals    Frequency    7X/week      PT Plan Current plan remains appropriate    Co-evaluation             End  of Session Equipment Utilized During Treatment: Gait belt Activity Tolerance: Patient limited by pain Patient left: with call bell/phone within reach;in chair;with nursing/sitter in room;with family/visitor present     Time: 2956-2130 PT Time Calculation (min) (ACUTE ONLY): 22 min  Charges:   $Therapeutic Exercise: 8-22 mins                    G Codes:      Derek Mound, PTA Pager: [phone]   11/23/2016, 4:36 PM

## 2016-11-23 NOTE — Progress Notes (Signed)
Subjective: 2 Days Post-Op Procedure(s) (LRB): RIGHT TOTAL KNEE ARTHROPLASTY (Right) Patient reports pain as 9 on 0-10 scale.    Objective: Vital signs in last 24 hours: Temp:  [98.7 F (37.1 C)-99.1 F (37.3 C)] 98.9 F (37.2 C) (02/07 1436) Pulse Rate:  [75-79] 75 (02/07 1436) Resp:  [18] 18 (02/07 1436) BP: (141-146)/(67-80) 146/80 (02/07 1436) SpO2:  [99 %-100 %] 100 % (02/07 1436)  Intake/Output from previous day: 02/06 0701 - 02/07 0700 In: 30 [P.O.:30] Out: 700 [Urine:700] Intake/Output this shift: Total I/O In: 40 [P.O.:40] Out: -    Recent Labs  11/21/16 1205 11/22/16 0444 11/23/16 0700  HGB 12.5 10.7* 10.2*    Recent Labs  11/22/16 0444 11/23/16 0700  WBC 8.3 10.8*  RBC 4.08 3.86*  HCT 33.6* 31.8*  PLT 278 246    Recent Labs  11/22/16 0444 11/23/16 0700  NA 138 138  K 4.9 3.9  CL 106 102  CO2 23 27  BUN 11 10  CREATININE 0.62 0.51  GLUCOSE 153* 152*  CALCIUM 9.0 9.1   No results for input(s): LABPT, INR in the last 72 hours.  ABD soft Neurovascular intact Sensation intact distally Intact pulses distally Dorsiflexion/Plantar flexion intact Incision: dressing C/D/I  Assessment/Plan: 2 Days Post-Op Procedure(s) (LRB): RIGHT TOTAL KNEE ARTHROPLASTY (Right)  Principal Problem:   Primary localized osteoarthritis of right knee Active Problems:   Gout   Essential hypertension   GERD   Irritable bowel syndrome   Low back pain   Impaired glucose tolerance   Hypercalcemia  Advance diet Up with therapy Plan for discharge tomorrow  This patient is having significant difficulty with pain control   Will add a third Oxycodone and add Celebrex.  Mark Benecke J 11/23/2016, 5:14 PM

## 2016-11-24 ENCOUNTER — Other Ambulatory Visit: Payer: Self-pay

## 2016-11-24 LAB — BASIC METABOLIC PANEL
ANION GAP: 10 (ref 5–15)
BUN: 13 mg/dL (ref 6–20)
CALCIUM: 8.9 mg/dL (ref 8.9–10.3)
CO2: 26 mmol/L (ref 22–32)
CREATININE: 0.49 mg/dL (ref 0.44–1.00)
Chloride: 99 mmol/L — ABNORMAL LOW (ref 101–111)
GFR calc non Af Amer: >60 mL/min (ref >60)
Glucose, Bld: 111 mg/dL — ABNORMAL HIGH (ref 65–99)
Potassium: 4 mmol/L (ref 3.5–5.1)
SODIUM: 135 mmol/L (ref 135–145)

## 2016-11-24 LAB — CBC
HCT: 32.8 % — ABNORMAL LOW (ref 36.0–46.0)
HEMOGLOBIN: 10.4 g/dL — AB (ref 12.0–15.0)
MCH: 26.1 pg (ref 26.0–34.0)
MCHC: 31.7 g/dL (ref 30.0–36.0)
MCV: 82.4 fL (ref 78.0–100.0)
Platelets: 260 10*3/uL (ref 150–400)
RBC: 3.98 MIL/uL (ref 3.87–5.11)
RDW: 13.5 % (ref 11.5–15.5)
WBC: 10.4 10*3/uL (ref 4.0–10.5)

## 2016-11-24 LAB — GLUCOSE, CAPILLARY: GLUCOSE-CAPILLARY: 105 mg/dL — AB (ref 65–99)

## 2016-11-24 MED ORDER — OXYCODONE HCL ER 20 MG PO T12A: 20.0000 mg | EXTENDED_RELEASE_TABLET | Freq: Two times a day (BID) | ORAL | 0 refills | Status: DC

## 2016-11-24 MED ORDER — POLYETHYLENE GLYCOL 3350 17 G PO PACK: PACK | ORAL | 0 refills | Status: DC

## 2016-11-24 MED ORDER — OXYCODONE HCL 5 MG PO TABS: ORAL_TABLET | ORAL | 0 refills | Status: DC

## 2016-11-24 MED ORDER — ENOXAPARIN SODIUM 30 MG/0.3ML ~~LOC~~ SOLN: 30.0000 mg | Freq: Two times a day (BID) | SUBCUTANEOUS | 0 refills | Status: DC

## 2016-11-24 MED ORDER — DOCUSATE SODIUM 100 MG PO CAPS: ORAL_CAPSULE | ORAL | 0 refills | Status: DC

## 2016-11-24 MED ORDER — CELECOXIB 200 MG PO CAPS: ORAL_CAPSULE | ORAL | 0 refills | Status: DC

## 2016-11-24 NOTE — Progress Notes (Signed)
RN called for epigastric heaviness, given maalox and ginger ale prior to my arrival, EKG unremarkable. Pt states "this feels like when I have indigestion." Pt did not feel like it was chest pain, pt states these symptoms were identical to when she experiences indigestion.

## 2016-11-24 NOTE — Discharge Summary (Signed)
Patient ID: SCHELLY CHUBA MRN: [id number] DOB/AGE: [birth year] 60 y.o.  Admit date: 11/21/2016 Discharge date: 11/24/2016  Admission Diagnoses:  Principal Problem:   Primary localized osteoarthritis of right knee Active Problems:   Gout   Essential hypertension   GERD   Irritable bowel syndrome   Low back pain   Impaired glucose tolerance   Hypercalcemia   Discharge Diagnoses:  Same  Past Medical History:  Diagnosis Date  . ALLERGIC RHINITIS 09/11/2007  . Anemia, unspecified 08/31/2012  . Arthritis    "knees" (11/23/2016)  . BACK PAIN   . EXTERNAL OTITIS 09/11/2007  . GERD 06/06/2007  . GOUT 11/14/2007  . High cholesterol   . HYPERTENSION 06/06/2007  . INTERMITTENT VERTIGO 09/11/2007  . Irritable bowel syndrome 06/06/2007  . LOW BACK PAIN 06/06/2007  . OSTEOARTHRITIS 06/06/2007  . PONV (postoperative nausea and vomiting)   . Primary localized osteoarthritis of right knee   . SHOULDER PAIN, RIGHT 12/30/2008    Surgeries: Procedure(s): RIGHT TOTAL KNEE ARTHROPLASTY on 11/21/2016   Consultants:   Discharged Condition: Improved  Hospital Course: JANIN KOZLOWSKI is an 61 y.o. female who was admitted 11/21/2016 for operative treatment ofPrimary localized osteoarthritis of right knee. Patient has severe unremitting pain that affects sleep, daily activities, and work/hobbies. After pre-op clearance the patient was taken to the operating room on 11/21/2016 and underwent  Procedure(s): RIGHT TOTAL KNEE ARTHROPLASTY.    Patient was given perioperative antibiotics: Anti-infectives    Start     Dose/Rate Route Frequency Ordered Stop   11/21/16 1400  ceFAZolin (ANCEF) IVPB 2g/100 mL premix     2 g 200 mL/hr over 30 Minutes Intravenous Every 6 hours 11/21/16 1158 11/21/16 2101   11/21/16 0700  ceFAZolin (ANCEF) 3 g in dextrose 5 % 50 mL IVPB     3 g 130 mL/hr over 30 Minutes Intravenous To [facility] 11/18/16 1528 11/21/16 4098       Patient was given sequential compression  devices, early ambulation, and chemoprophylaxis to prevent DVT.  Patient benefited maximally from hospital stay and there were no complications.    Recent vital signs: Patient Vitals for the past 24 hrs:  BP Temp Temp src Pulse Resp SpO2  11/24/16 0447 111/68 98.3 F (36.8 C) Oral 74 18 99 %  11/24/16 0105 106/64 98.4 F (36.9 C) Oral 76 - 100 %  11/23/16 2105 (!) 144/75 97.5 F (36.4 C) Oral 81 18 97 %  11/23/16 1436 (!) 146/80 98.9 F (37.2 C) Oral 75 18 100 %     Recent laboratory studies:  Recent Labs  11/23/16 0700 11/24/16 0340  WBC 10.8* 10.4  HGB 10.2* 10.4*  HCT 31.8* 32.8*  PLT 246 260  NA 138 135  K 3.9 4.0  CL 102 99*  CO2 27 26  BUN 10 13  CREATININE 0.51 0.49  GLUCOSE 152* 111*  CALCIUM 9.1 8.9     Discharge Medications:   Allergies as of 11/24/2016      Reactions   Latex Itching      Medication List    STOP taking these medications   benzonatate 100 MG capsule Commonly known as:  TESSALON   nabumetone 500 MG tablet Commonly known as:  RELAFEN   omeprazole 20 MG capsule Commonly known as:  PRILOSEC   OVER THE COUNTER MEDICATION   traMADol 50 MG tablet Commonly known as:  ULTRAM     TAKE these medications   acetaminophen 650 MG CR tablet Commonly known as:  TYLENOL Take 1,300 mg by mouth every 8 (eight) hours as needed for pain.   amLODipine 10 MG tablet Commonly known as:  NORVASC TAKE ONE TABLET BY MOUTH ONCE DAILY   celecoxib 200 MG capsule Commonly known as:  CELEBREX 1 tab po q day with food for pain and  swelling   cyclobenzaprine 5 MG tablet Commonly known as:  FLEXERIL Take 1 tablet (5 mg total) by mouth 3 (three) times daily as needed for muscle spasms.   dicyclomine 20 MG tablet Commonly known as:  BENTYL TAKE ONE TABLET BY MOUTH THREE TIMES DAILY BEFORE MEAL(S) What changed:  See the new instructions.   docusate sodium 100 MG capsule Commonly known as:  COLACE 1 tab 2 times a day while on narcotics.  STOOL  SOFTENER   enoxaparin 30 MG/0.3ML injection Commonly known as:  LOVENOX Inject 0.3 mLs (30 mg total) into the skin every 12 (twelve) hours.   irbesartan 300 MG tablet Commonly known as:  AVAPRO TAKE ONE TABLET BY MOUTH ONCE DAILY   multivitamin tablet Take 1 tablet by mouth daily.   oxyCODONE 5 MG immediate release tablet Commonly known as:  Oxy IR/ROXICODONE 1-2 tablets every 4-6 hrs as needed for breakthrough pain   oxyCODONE 20 mg 12 hr tablet Commonly known as:  OXYCONTIN Take 1 tablet (20 mg total) by mouth every 12 (twelve) hours.   pantoprazole 40 MG tablet Commonly known as:  PROTONIX Take 1 tablet (40 mg total) by mouth daily.   polyethylene glycol packet Commonly known as:  MIRALAX / GLYCOLAX 17grams in 6 oz of water twice a day until bowel movement.  LAXITIVE.  Restart if two days since last bowel movement   Vitamin D3 5000 units Caps Take 5,000 Units by mouth daily.       Diagnostic Studies: No results found.  Disposition: 01-Home or Self Care  Discharge Instructions    CPM    Complete by:  As directed    Continuous passive motion machine (CPM):      Use the CPM from 0 to 90 for 6 hours per day.       You may break it up into 2 or 3 sessions per day.      Use CPM for 2 weeks or until you are told to stop.   Call MD / Call 911    Complete by:  As directed    If you experience chest pain or shortness of breath, CALL 911 and be transported to the hospital emergency room.  If you develope a fever above 101 F, pus (white drainage) or increased drainage or redness at the wound, or calf pain, call your surgeon's office.   Change dressing    Complete by:  As directed    DO NOT REMOVE BANDAGE OVER SURGICAL INCISION.  WASH WHOLE LEG INCLUDING OVER THE WATERPROOF BANDAGE WITH SOAP AND WATER EVERY DAY.   Constipation Prevention    Complete by:  As directed    Drink plenty of fluids.  Prune juice may be helpful.  You may use a stool softener, such as Colace (over  the counter) 100 mg twice a day.  Use MiraLax (over the counter) for constipation as needed.   Diet - low sodium heart healthy    Complete by:  As directed    Discharge instructions    Complete by:  As directed    INSTRUCTIONS AFTER JOINT REPLACEMENT   Remove items at home which could result in a fall.  This includes throw rugs or furniture in walking pathways ICE to the affected joint every three hours while awake for 30 minutes at a time, for at least the first 3-5 days, and then as needed for pain and swelling.  Continue to use ice for pain and swelling. You may notice swelling that will progress down to the foot and ankle.  This is normal after surgery.  Elevate your leg when you are not up walking on it.   Continue to use the breathing machine you got in the hospital (incentive spirometer) which will help keep your temperature down.  It is common for your temperature to cycle up and down following surgery, especially at night when you are not up moving around and exerting yourself.  The breathing machine keeps your lungs expanded and your temperature down.   DIET:  As you were doing prior to hospitalization, we recommend a well-balanced diet.  DRESSING / WOUND CARE / SHOWERING  Keep the surgical dressing until follow up.  The dressing is water proof, so you can shower without any extra covering.  IF THE DRESSING FALLS OFF or the wound gets wet inside, change the dressing with sterile gauze.  Please use good hand washing techniques before changing the dressing.  Do not use any lotions or creams on the incision until instructed by your surgeon.    ACTIVITY  Increase activity slowly as tolerated, but follow the weight bearing instructions below.   No driving for 6 weeks or until further direction given by your physician.  You cannot drive while taking narcotics.  No lifting or carrying greater than 10 lbs. until further directed by your surgeon. Avoid periods of inactivity such as sitting  longer than an hour when not asleep. This helps prevent blood clots.  You may return to work once you are authorized by your doctor.     WEIGHT BEARING   Weight bearing as tolerated with assist device (walker, cane, etc) as directed, use it as long as suggested by your surgeon or therapist, typically at least 2-3 weeks.   EXERCISES  Results after joint replacement surgery are often greatly improved when you follow the exercise, range of motion and muscle strengthening exercises prescribed by your doctor. Safety measures are also important to protect the joint from further injury. Any time any of these exercises cause you to have increased pain or swelling, decrease what you are doing until you are comfortable again and then slowly increase them. If you have problems or questions, call your caregiver or physical therapist for advice.   Rehabilitation is important following a joint replacement. After just a few days of immobilization, the muscles of the leg can become weakened and shrink (atrophy).  These exercises are designed to build up the tone and strength of the thigh and leg muscles and to improve motion. Often times heat used for twenty to thirty minutes before working out will loosen up your tissues and help with improving the range of motion but do not use heat for the first two weeks following surgery (sometimes heat can increase post-operative swelling).   These exercises can be done on a training (exercise) mat, on the floor, on a table or on a bed. Use whatever works the best and is most comfortable for you.    Use music or television while you are exercising so that the exercises are a pleasant break in your day. This will make your life better with the exercises acting as a break in your  routine that you can look forward to.   Perform all exercises about fifteen times, three times per day or as directed.  You should exercise both the operative leg and the other leg as well.    Exercises include:  Quad Sets - Tighten up the muscle on the front of the thigh (Quad) and hold for 5-10 seconds.   Straight Leg Raises - With your knee straight (if you were given a brace, keep it on), lift the leg to 60 degrees, hold for 3 seconds, and slowly lower the leg.  Perform this exercise against resistance later as your leg gets stronger.  Leg Slides: Lying on your back, slowly slide your foot toward your buttocks, bending your knee up off the floor (only go as far as is comfortable). Then slowly slide your foot back down until your leg is flat on the floor again.  Angel Wings: Lying on your back spread your legs to the side as far apart as you can without causing discomfort.  Hamstring Strength:  Lying on your back, push your heel against the floor with your leg straight by tightening up the muscles of your buttocks.  Repeat, but this time bend your knee to a comfortable angle, and push your heel against the floor.  You may put a pillow under the heel to make it more comfortable if necessary.   A rehabilitation program following joint replacement surgery can speed recovery and prevent re-injury in the future due to weakened muscles. Contact your doctor or a physical therapist for more information on knee rehabilitation.    CONSTIPATION  Constipation is defined medically as fewer than three stools per week and severe constipation as less than one stool per week.  Even if you have a regular bowel pattern at home, your normal regimen is likely to be disrupted due to multiple reasons following surgery.  Combination of anesthesia, postoperative narcotics, change in appetite and fluid intake all can affect your bowels.   YOU MUST use at least one of the following options; they are listed in order of increasing strength to get the job done.  They are all available over the counter, and you may need to use some, POSSIBLY even all of these options:    Drink plenty of fluids (prune juice may  be helpful) and high fiber foods Colace 100 mg by mouth twice a day  Senokot for constipation as directed and as needed Dulcolax (bisacodyl), take with full glass of water  Miralax (polyethylene glycol) once or twice a day as needed.  If you have tried all these things and are unable to have a bowel movement in the first 3-4 days after surgery call either your surgeon or your primary doctor.    If you experience loose stools or diarrhea, hold the medications until you stool forms back up.  If your symptoms do not get better within 1 week or if they get worse, check with your doctor.  If you experience "the worst abdominal pain ever" or develop nausea or vomiting, please contact the office immediately for further recommendations for treatment.   ITCHING:  If you experience itching with your medications, try taking only a single pain pill, or even half a pain pill at a time.  You can also use Benadryl over the counter for itching or also to help with sleep.   TED HOSE STOCKINGS:  Use stockings on both legs until for at least 2 weeks or as directed by physician office. They may be  removed at night for sleeping.  MEDICATIONS:  See your medication summary on the "After Visit Summary" that nursing will review with you.  You may have some home medications which will be placed on hold until you complete the course of blood thinner medication.  It is important for you to complete the blood thinner medication as prescribed.  PRECAUTIONS:  If you experience chest pain or shortness of breath - call 911 immediately for transfer to the hospital emergency department.   If you develop a fever greater that 101 F, purulent drainage from wound, increased redness or drainage from wound, foul odor from the wound/dressing, or calf pain - CONTACT YOUR SURGEON.                                                   FOLLOW-UP APPOINTMENTS:  If you do not already have a post-op appointment, please call the office for an  appointment to be seen by your surgeon.  Guidelines for how soon to be seen are listed in your "After Visit Summary", but are typically between 1-4 weeks after surgery.  OTHER INSTRUCTIONS:   Knee Replacement:  Do not place pillow under knee, focus on keeping the knee straight while resting. CPM instructions: 0-90 degrees, 2 hours in the morning, 2 hours in the afternoon, and 2 hours in the evening. Place foam block, curve side up under heel at all times except when in CPM or when walking.  DO NOT modify, tear, cut, or change the foam block in any way.  MAKE SURE YOU:  Understand these instructions.  Get help right away if you are not doing well or get worse.    Thank you for letting us be a part of your medical care team.  It is a privilege we respect greatly.  We hope these instructions will help you stay on track for a fast and full recovery!   Do not put a pillow under the knee. Place it under the heel.    Complete by:  As directed    Place gray foam block, curve side up under heel at all times except when in CPM or when walking.  DO NOT modify, tear, cut, or change in any way the gray foam block.   Increase activity slowly as tolerated    Complete by:  As directed    Patient may shower    Complete by:  As directed    Aquacel dressing is water proof    Wash over it and the whole leg with soap and water at the end of your shower   TED hose    Complete by:  As directed    Use stockings (TED hose) for 2 weeks on both leg(s).  You may remove them at night for sleeping.      Follow-up Information    Nilda Simmer, MD Follow up on 12/05/2016.   Specialty:  Orthopedic Surgery Why:  appt time 4:30pm Contact information: [address] [place] [address] [phone]            Signed: Pascal Lux 11/24/2016, 8:05 AM

## 2016-11-24 NOTE — Progress Notes (Signed)
Physical Therapy Treatment Patient Details Name: Bianca Campbell MRN: [id number] DOB: 02-Feb-1956 Today's Date: 11/24/2016    History of Present Illness  Bianca Campbell, 61 y.o. female, has a history of pain and functional disability in the right knee due to arthritis and has failed non-surgical conservative treatments for greater than 12 weeks to includeNSAID's and/or analgesics, corticosteriod injections, viscosupplementation injections, flexibility and strengthening excercises, supervised PT with diminished ADL's post treatment, weight reduction as appropriate and activity modification.    Right TKA with WBAT with knee immobilizer.      PT Comments    Patient continues to be limited by pain when performing therex and requires assist to perform HEP. Husband can provide assistance. Pt does however demonstrate improved gait mechanics and cadence this am. Current plan remains appropriate.   Follow Up Recommendations  Home health PT;Supervision/Assistance - 24 hour     Equipment Recommendations  None recommended by PT    Recommendations for Other Services       Precautions / Restrictions Precautions Precautions: Fall Restrictions Weight Bearing Restrictions: Yes RLE Weight Bearing: Weight bearing as tolerated    Mobility  Bed Mobility               General bed mobility comments: OOB in chair upon arrival  Transfers Overall transfer level: Needs assistance Equipment used: Rolling walker (2 wheeled) Transfers: Sit to/from BJ'sStand;Stand Pivot Transfers Sit to Stand: Supervision Stand pivot transfers: Supervision       General transfer comment: increased time; supervision for safety  Ambulation/Gait Ambulation/Gait assistance: Supervision Ambulation Distance (Feet): 20 Feet Assistive device: Rolling walker (2 wheeled) Gait Pattern/deviations: Step-through pattern;Decreased stride length Gait velocity: decreased   General Gait Details: pt with improved gait mechanics  and cadence   Stairs            Wheelchair Mobility    Modified Rankin (Stroke Patients Only)       Balance Overall balance assessment: Needs assistance Sitting-balance support: Feet supported Sitting balance-Leahy Scale: Good       Standing balance-Leahy Scale: Fair                      Cognition Arousal/Alertness: Awake/alert Behavior During Therapy: WFL for tasks assessed/performed Overall Cognitive Status: Within Functional Limits for tasks assessed                      Exercises Total Joint Exercises Long Arc Quad: Right;AAROM;AROM;10 reps;Seated Knee Flexion: AROM;Right;Seated;Other (comment);5 reps (10 sec holds)    General Comments General comments (skin integrity, edema, etc.): reviewed HEP frequency, use of zero degree foam, stair management, and precautions      Pertinent Vitals/Pain Pain Assessment: Faces Faces Pain Scale: Hurts even more Pain Location: right knee Pain Descriptors / Indicators: Sore;Grimacing;Guarding;Moaning Pain Intervention(s): Limited activity within patient's tolerance;Premedicated before session;Monitored during session;Repositioned    Home Living                      Prior Function            PT Goals (current goals can now be found in the care plan section) Acute Rehab PT Goals Patient Stated Goal: to go home Progress towards PT goals: Progressing toward goals    Frequency    7X/week      PT Plan Current plan remains appropriate    Co-evaluation             End of Session Equipment Utilized  During Treatment: Gait belt Activity Tolerance: Patient limited by pain Patient left: with call bell/phone within reach;in chair     Time: 1610-9604 PT Time Calculation (min) (ACUTE ONLY): 21 min  Charges:  $Gait Training: 8-22 mins                    G Codes:      Derek Mound, PTA Pager: [phone]   11/24/2016, 9:26 AM

## 2016-11-24 NOTE — Progress Notes (Signed)
Orthopedic Tech Progress Note Patient Details:  Bianca Campbell 1956-01-31 [id number]  Patient ID: Bianca BoxerLinda P Campbell, female   DOB: 1956-01-31, 61 y.o.   MRN: [id number] Applied cpm 0-64  Bianca Campbell, [name] 11/24/2016, 6:38 AM

## 2016-11-24 NOTE — Progress Notes (Signed)
Pt ready for discharge. Pt. Is alert and oriented. Pt is hemodynamically stable. AVS reviewed with pt. Capable of re verbalizing medication regimen. IV removed. Discharge plan appropriate and in place.

## 2016-11-25 NOTE — Care Management Note (Signed)
Case Management Note  Patient Details  Name: Bianca Campbell MRN: [id number] Date of Birth: [birth year]  Subjective/Objective:    61 yr old female s/p right total knee arthroplasty.                Action/Plan:  Patient was preoperatively setup with [facility], no changes. DME has been delivered to patient.    Expected Discharge Date:  11/23/16             Expected Discharge Plan:  Home w Home Health Services  In-House Referral:  NA  Discharge planning Services  CM Consult  Post Acute Care Choice:  Home Health Choice offered to:  Patient  DME Arranged:   CPM RW 3in1 DME Agency:   [facility]  HH Arranged:  PT HH Agency:  [facility] (formerly [facility])  Status of Service:  Completed, signed off  If discussed at [employer] Meetings, dates discussed:    Additional Comments:  Durenda GuthrieBrady, Alva Kuenzel Naomi, RN 11/25/2016, 3:34 PM

## 2016-11-26 DIAGNOSIS — I10 Essential (primary) hypertension: Secondary | ICD-10-CM | POA: Diagnosis not present

## 2016-11-26 DIAGNOSIS — Z96651 Presence of right artificial knee joint: Secondary | ICD-10-CM | POA: Diagnosis not present

## 2016-11-26 DIAGNOSIS — Z471 Aftercare following joint replacement surgery: Secondary | ICD-10-CM | POA: Diagnosis not present

## 2016-11-28 DIAGNOSIS — I10 Essential (primary) hypertension: Secondary | ICD-10-CM | POA: Diagnosis not present

## 2016-11-28 DIAGNOSIS — Z96651 Presence of right artificial knee joint: Secondary | ICD-10-CM | POA: Diagnosis not present

## 2016-11-28 DIAGNOSIS — Z471 Aftercare following joint replacement surgery: Secondary | ICD-10-CM | POA: Diagnosis not present

## 2016-11-30 DIAGNOSIS — Z471 Aftercare following joint replacement surgery: Secondary | ICD-10-CM | POA: Diagnosis not present

## 2016-11-30 DIAGNOSIS — Z96651 Presence of right artificial knee joint: Secondary | ICD-10-CM | POA: Diagnosis not present

## 2016-11-30 DIAGNOSIS — I10 Essential (primary) hypertension: Secondary | ICD-10-CM | POA: Diagnosis not present

## 2016-12-02 DIAGNOSIS — Z471 Aftercare following joint replacement surgery: Secondary | ICD-10-CM | POA: Diagnosis not present

## 2016-12-02 DIAGNOSIS — Z96651 Presence of right artificial knee joint: Secondary | ICD-10-CM | POA: Diagnosis not present

## 2016-12-02 DIAGNOSIS — I10 Essential (primary) hypertension: Secondary | ICD-10-CM | POA: Diagnosis not present

## 2016-12-05 DIAGNOSIS — I10 Essential (primary) hypertension: Secondary | ICD-10-CM | POA: Diagnosis not present

## 2016-12-05 DIAGNOSIS — Z471 Aftercare following joint replacement surgery: Secondary | ICD-10-CM | POA: Diagnosis not present

## 2016-12-05 DIAGNOSIS — Z96651 Presence of right artificial knee joint: Secondary | ICD-10-CM | POA: Diagnosis not present

## 2016-12-07 DIAGNOSIS — I10 Essential (primary) hypertension: Secondary | ICD-10-CM | POA: Diagnosis not present

## 2016-12-07 DIAGNOSIS — Z96651 Presence of right artificial knee joint: Secondary | ICD-10-CM | POA: Diagnosis not present

## 2016-12-07 DIAGNOSIS — Z471 Aftercare following joint replacement surgery: Secondary | ICD-10-CM | POA: Diagnosis not present

## 2016-12-09 DIAGNOSIS — Z96651 Presence of right artificial knee joint: Secondary | ICD-10-CM | POA: Diagnosis not present

## 2016-12-09 DIAGNOSIS — I10 Essential (primary) hypertension: Secondary | ICD-10-CM | POA: Diagnosis not present

## 2016-12-09 DIAGNOSIS — Z471 Aftercare following joint replacement surgery: Secondary | ICD-10-CM | POA: Diagnosis not present

## 2016-12-12 DIAGNOSIS — Z471 Aftercare following joint replacement surgery: Secondary | ICD-10-CM | POA: Diagnosis not present

## 2016-12-12 DIAGNOSIS — Z96651 Presence of right artificial knee joint: Secondary | ICD-10-CM | POA: Diagnosis not present

## 2016-12-12 DIAGNOSIS — I10 Essential (primary) hypertension: Secondary | ICD-10-CM | POA: Diagnosis not present

## 2016-12-13 ENCOUNTER — Ambulatory Visit (INDEPENDENT_AMBULATORY_CARE_PROVIDER_SITE_OTHER): Payer: 59 | Admitting: Internal Medicine

## 2016-12-13 ENCOUNTER — Encounter: Payer: Self-pay | Admitting: Internal Medicine

## 2016-12-13 VITALS — BP 140/90 | HR 89 | Temp 98.0°F | Ht <= 58 in | Wt 222.0 lb

## 2016-12-13 DIAGNOSIS — Z22322 Carrier or suspected carrier of Methicillin resistant Staphylococcus aureus: Secondary | ICD-10-CM | POA: Insufficient documentation

## 2016-12-13 DIAGNOSIS — J019 Acute sinusitis, unspecified: Secondary | ICD-10-CM | POA: Diagnosis not present

## 2016-12-13 DIAGNOSIS — B370 Candidal stomatitis: Secondary | ICD-10-CM | POA: Diagnosis not present

## 2016-12-13 DIAGNOSIS — I10 Essential (primary) hypertension: Secondary | ICD-10-CM | POA: Diagnosis not present

## 2016-12-13 MED ORDER — LEVOFLOXACIN 500 MG PO TABS: 500.0000 mg | ORAL_TABLET | Freq: Every day | ORAL | 0 refills | Status: AC

## 2016-12-13 MED ORDER — NYSTATIN 100000 UNIT/ML MT SUSP: 500000.0000 [IU] | Freq: Four times a day (QID) | OROMUCOSAL | 1 refills | Status: AC

## 2016-12-13 NOTE — Assessment & Plan Note (Signed)
S/p mupirocin tx recently, will hold on further tx

## 2016-12-13 NOTE — Progress Notes (Signed)
Subjective:    Patient ID: Bianca Campbell, female    DOB: 29-Jul-1956, 61 y.o.   MRN: [id number]  HPI   Here with 2-3 days acute onset fever, facial pain, pressure, headache, general weakness and malaise, and greenish d/c, with mild ST and cough, but pt denies chest pain, wheezing, increased sob or doe, orthopnea, PND, increased LE swelling, palpitations, dizziness or syncope. Incidentally had recent tx with mupirocin for MRSA + prior to right knee surgury x 3 wks.  Also with recent nystatin for thrush but no completely cleared and concerned may recur with further antibx Past Medical History:  Diagnosis Date  . ALLERGIC RHINITIS 09/11/2007  . Anemia, unspecified 08/31/2012  . Arthritis    "knees" (11/23/2016)  . BACK PAIN   . EXTERNAL OTITIS 09/11/2007  . GERD 06/06/2007  . GOUT 11/14/2007  . High cholesterol   . HYPERTENSION 06/06/2007  . INTERMITTENT VERTIGO 09/11/2007  . Irritable bowel syndrome 06/06/2007  . LOW BACK PAIN 06/06/2007  . OSTEOARTHRITIS 06/06/2007  . PONV (postoperative nausea and vomiting)   . Primary localized osteoarthritis of right knee   . SHOULDER PAIN, RIGHT 12/30/2008   Past Surgical History:  Procedure Laterality Date  . BILATERAL SALPINGOOPHORECTOMY    . CESAREAN SECTION  1987, 1991  . COLONOSCOPY     pt stated 2015 or 2016 unknown md  . EXPLORATORY LAPAROTOMY     bowel obstruction  . INGUINAL HERNIA REPAIR Bilateral    numerous hernia repairs 1996 to 2011  . JOINT REPLACEMENT    . KNEE ARTHROSCOPY Bilateral 2013 - 11/11/2015  . LAPAROSCOPIC CHOLECYSTECTOMY  1991  . s/p incarcerated hernia and ovary cyst Jan 2011  2011  . TOTAL KNEE ARTHROPLASTY Right 11/21/2016   Procedure: RIGHT TOTAL KNEE ARTHROPLASTY;  Surgeon: Salvatore Marvel, MD;  Location: [facility] OR;  Service: Orthopedics;  Laterality: Right;  . UMBILICAL HERNIA REPAIR     numerous hernia repairs 1996 to 2011    reports that she has never smoked. She has never used smokeless tobacco. She reports that  she does not drink alcohol or use drugs. family history includes Arthritis in her mother; Colon polyps (age of onset: 38) in her father. Allergies  Allergen Reactions  . Latex Itching   Current Outpatient Prescriptions on File Prior to Visit  Medication Sig Dispense Refill  . acetaminophen (TYLENOL) 650 MG CR tablet Take 1,300 mg by mouth every 8 (eight) hours as needed for pain.    Marland Kitchen amLODipine (NORVASC) 10 MG tablet TAKE ONE TABLET BY MOUTH ONCE DAILY 90 tablet 1  . celecoxib (CELEBREX) 200 MG capsule 1 tab po q day with food for pain and  swelling 30 capsule 0  . Cholecalciferol (VITAMIN D3) 5000 units CAPS Take 5,000 Units by mouth daily.     . cyclobenzaprine (FLEXERIL) 5 MG tablet Take 1 tablet (5 mg total) by mouth 3 (three) times daily as needed for muscle spasms. (Patient taking differently: Take 5 mg by mouth 3 (three) times daily as needed for muscle spasms. ) 60 tablet 1  . dicyclomine (BENTYL) 20 MG tablet TAKE ONE TABLET BY MOUTH THREE TIMES DAILY BEFORE MEAL(S) (Patient taking differently: TAKE ONE TABLET BY MOUTH THREE TIMES AS NEEDED FOR STOMACH CRAMPING) 50 tablet 1  . docusate sodium (COLACE) 100 MG capsule 1 tab 2 times a day while on narcotics.  STOOL SOFTENER 60 capsule 0  . enoxaparin (LOVENOX) 30 MG/0.3ML injection Inject 0.3 mLs (30 mg total) into the skin every  12 (twelve) hours. 30 Syringe 0  . irbesartan (AVAPRO) 300 MG tablet TAKE ONE TABLET BY MOUTH ONCE DAILY 90 tablet 1  . Multiple Vitamin (MULTIVITAMIN) tablet Take 1 tablet by mouth daily.    Marland Kitchen. oxyCODONE (OXY IR/ROXICODONE) 5 MG immediate release tablet 1-2 tablets every 4-6 hrs as needed for breakthrough pain 80 tablet 0  . oxyCODONE (OXYCONTIN) 20 mg 12 hr tablet Take 1 tablet (20 mg total) by mouth every 12 (twelve) hours. 14 tablet 0  . pantoprazole (PROTONIX) 40 MG tablet Take 1 tablet (40 mg total) by mouth daily. 90 tablet 3  . polyethylene glycol (MIRALAX / GLYCOLAX) packet 17grams in 6 oz of water twice  a day until bowel movement.  LAXITIVE.  Restart if two days since last bowel movement 14 each 0   No current facility-administered medications on file prior to visit.    Review of Systems  Constitutional: Negative for unusual diaphoresis or night sweats HENT: Negative for ear swelling or discharge Eyes: Negative for worsening visual haziness  Respiratory: Negative for choking and stridor.   Gastrointestinal: Negative for distension or worsening eructation Genitourinary: Negative for retention or change in urine volume.  Musculoskeletal: Negative for other MSK pain or swelling Skin: Negative for color change and worsening wound Neurological: Negative for tremors and numbness other than noted  Psychiatric/Behavioral: Negative for decreased concentration or agitation other than above   All other system neg per pt    Objective:   Physical Exam BP 140/90 (BP Location: Left Arm, Patient Position: Sitting, Cuff Size: Normal)   Pulse 89   Temp 98 F (36.7 C) (Oral)   Ht 4\' 10"  (1.473 m)   Wt 222 lb (100.7 kg)   SpO2 99%   BMI 46.40 kg/m  VS noted, mild ill Constitutional: Pt appears in no apparent distress HENT: Head: NCAT.  Right Ear: External ear normal.  Left Ear: External ear normal.  Eyes: . Pupils are equal, round, and reactive to light. Conjunctivae and EOM are normal Tongue - trace thrush still present white coating right lateral distal dorsal tongue Bilat tm's with mild erythema.  Max sinus areas mild tender.  Pharynx with mild erythema, no exudate Neck: Normal range of motion. Neck supple.No LA or swelling  Cardiovascular: Normal rate and regular rhythm.   Pulmonary/Chest: Effort normal and breath sounds without rales or wheezing.  Neurological: Pt is alert. Not confused , motor grossly intact Skin: Skin is warm. No rash, no LE edema Psychiatric: Pt behavior is normal. No agitation.  No other new exam findings    Assessment & Plan:

## 2016-12-13 NOTE — Assessment & Plan Note (Signed)
Not compeltely cleared, for repeat nystatin soln asd,  to f/u any worsening symptoms or concerns

## 2016-12-13 NOTE — Progress Notes (Signed)
Pre visit review using our clinic review tool, if applicable. No additional management support is needed unless otherwise documented below in the visit note. 

## 2016-12-13 NOTE — Assessment & Plan Note (Signed)
BP Readings from Last 3 Encounters:  12/13/16 140/90  11/24/16 (!) 135/96  11/10/16 (!) 146/84   stable overall by history and exam, recent data reviewed with pt, and pt to continue medical treatment as before,  to f/u any worsening symptoms or concerns

## 2016-12-13 NOTE — Assessment & Plan Note (Signed)
Mild to mod, for non MRSA covering antibx for now as had recent tx with mupirocin, consider change for any persistent or wrosening,  to f/u any worsening symptoms or concerns

## 2016-12-13 NOTE — Patient Instructions (Signed)
Please take all new medication as prescribed - the antibiotic, and nystatin  Please continue all other medications as before, and refills have been done if requested.  Please have the pharmacy call with any other refills you may need.  Please keep your appointments with your specialists as you may have planned

## 2016-12-14 DIAGNOSIS — Z471 Aftercare following joint replacement surgery: Secondary | ICD-10-CM | POA: Diagnosis not present

## 2016-12-14 DIAGNOSIS — Z96651 Presence of right artificial knee joint: Secondary | ICD-10-CM | POA: Diagnosis not present

## 2016-12-14 DIAGNOSIS — I10 Essential (primary) hypertension: Secondary | ICD-10-CM | POA: Diagnosis not present

## 2016-12-16 DIAGNOSIS — Z96651 Presence of right artificial knee joint: Secondary | ICD-10-CM | POA: Diagnosis not present

## 2016-12-16 DIAGNOSIS — I10 Essential (primary) hypertension: Secondary | ICD-10-CM | POA: Diagnosis not present

## 2016-12-16 DIAGNOSIS — Z471 Aftercare following joint replacement surgery: Secondary | ICD-10-CM | POA: Diagnosis not present

## 2016-12-19 DIAGNOSIS — M25661 Stiffness of right knee, not elsewhere classified: Secondary | ICD-10-CM | POA: Diagnosis not present

## 2016-12-19 DIAGNOSIS — M1711 Unilateral primary osteoarthritis, right knee: Secondary | ICD-10-CM | POA: Diagnosis not present

## 2016-12-19 DIAGNOSIS — M25561 Pain in right knee: Secondary | ICD-10-CM | POA: Diagnosis not present

## 2016-12-21 DIAGNOSIS — M25561 Pain in right knee: Secondary | ICD-10-CM | POA: Diagnosis not present

## 2016-12-21 DIAGNOSIS — M1711 Unilateral primary osteoarthritis, right knee: Secondary | ICD-10-CM | POA: Diagnosis not present

## 2016-12-21 DIAGNOSIS — M25661 Stiffness of right knee, not elsewhere classified: Secondary | ICD-10-CM | POA: Diagnosis not present

## 2016-12-22 DIAGNOSIS — M25561 Pain in right knee: Secondary | ICD-10-CM | POA: Diagnosis not present

## 2016-12-22 DIAGNOSIS — M1711 Unilateral primary osteoarthritis, right knee: Secondary | ICD-10-CM | POA: Diagnosis not present

## 2016-12-22 DIAGNOSIS — M25661 Stiffness of right knee, not elsewhere classified: Secondary | ICD-10-CM | POA: Diagnosis not present

## 2016-12-28 DIAGNOSIS — M25561 Pain in right knee: Secondary | ICD-10-CM | POA: Diagnosis not present

## 2016-12-28 DIAGNOSIS — M25661 Stiffness of right knee, not elsewhere classified: Secondary | ICD-10-CM | POA: Diagnosis not present

## 2016-12-28 DIAGNOSIS — M1711 Unilateral primary osteoarthritis, right knee: Secondary | ICD-10-CM | POA: Diagnosis not present

## 2017-01-02 DIAGNOSIS — M25661 Stiffness of right knee, not elsewhere classified: Secondary | ICD-10-CM | POA: Diagnosis not present

## 2017-01-02 DIAGNOSIS — M1711 Unilateral primary osteoarthritis, right knee: Secondary | ICD-10-CM | POA: Diagnosis not present

## 2017-01-02 DIAGNOSIS — M25561 Pain in right knee: Secondary | ICD-10-CM | POA: Diagnosis not present

## 2017-01-05 DIAGNOSIS — M25561 Pain in right knee: Secondary | ICD-10-CM | POA: Diagnosis not present

## 2017-01-05 DIAGNOSIS — M25661 Stiffness of right knee, not elsewhere classified: Secondary | ICD-10-CM | POA: Diagnosis not present

## 2017-01-05 DIAGNOSIS — M1711 Unilateral primary osteoarthritis, right knee: Secondary | ICD-10-CM | POA: Diagnosis not present

## 2017-01-16 DIAGNOSIS — M25661 Stiffness of right knee, not elsewhere classified: Secondary | ICD-10-CM | POA: Diagnosis not present

## 2017-01-16 DIAGNOSIS — M25561 Pain in right knee: Secondary | ICD-10-CM | POA: Diagnosis not present

## 2017-01-16 DIAGNOSIS — M1711 Unilateral primary osteoarthritis, right knee: Secondary | ICD-10-CM | POA: Diagnosis not present

## 2017-01-19 DIAGNOSIS — M1711 Unilateral primary osteoarthritis, right knee: Secondary | ICD-10-CM | POA: Diagnosis not present

## 2017-01-19 DIAGNOSIS — M25561 Pain in right knee: Secondary | ICD-10-CM | POA: Diagnosis not present

## 2017-01-19 DIAGNOSIS — M25661 Stiffness of right knee, not elsewhere classified: Secondary | ICD-10-CM | POA: Diagnosis not present

## 2017-01-24 DIAGNOSIS — M25661 Stiffness of right knee, not elsewhere classified: Secondary | ICD-10-CM | POA: Diagnosis not present

## 2017-01-24 DIAGNOSIS — M1711 Unilateral primary osteoarthritis, right knee: Secondary | ICD-10-CM | POA: Diagnosis not present

## 2017-01-24 DIAGNOSIS — M25561 Pain in right knee: Secondary | ICD-10-CM | POA: Diagnosis not present

## 2017-01-26 DIAGNOSIS — M1711 Unilateral primary osteoarthritis, right knee: Secondary | ICD-10-CM | POA: Diagnosis not present

## 2017-01-26 DIAGNOSIS — M25661 Stiffness of right knee, not elsewhere classified: Secondary | ICD-10-CM | POA: Diagnosis not present

## 2017-01-26 DIAGNOSIS — M25561 Pain in right knee: Secondary | ICD-10-CM | POA: Diagnosis not present

## 2017-02-01 DIAGNOSIS — M25561 Pain in right knee: Secondary | ICD-10-CM | POA: Diagnosis not present

## 2017-02-01 DIAGNOSIS — M25661 Stiffness of right knee, not elsewhere classified: Secondary | ICD-10-CM | POA: Diagnosis not present

## 2017-02-01 DIAGNOSIS — M1711 Unilateral primary osteoarthritis, right knee: Secondary | ICD-10-CM | POA: Diagnosis not present

## 2017-02-07 DIAGNOSIS — M25661 Stiffness of right knee, not elsewhere classified: Secondary | ICD-10-CM | POA: Diagnosis not present

## 2017-02-07 DIAGNOSIS — M25561 Pain in right knee: Secondary | ICD-10-CM | POA: Diagnosis not present

## 2017-02-07 DIAGNOSIS — R262 Difficulty in walking, not elsewhere classified: Secondary | ICD-10-CM | POA: Diagnosis not present

## 2017-02-09 DIAGNOSIS — M25561 Pain in right knee: Secondary | ICD-10-CM | POA: Diagnosis not present

## 2017-02-09 DIAGNOSIS — M1711 Unilateral primary osteoarthritis, right knee: Secondary | ICD-10-CM | POA: Diagnosis not present

## 2017-02-09 DIAGNOSIS — M25661 Stiffness of right knee, not elsewhere classified: Secondary | ICD-10-CM | POA: Diagnosis not present

## 2017-02-15 DIAGNOSIS — M1711 Unilateral primary osteoarthritis, right knee: Secondary | ICD-10-CM | POA: Diagnosis not present

## 2017-02-15 DIAGNOSIS — M25661 Stiffness of right knee, not elsewhere classified: Secondary | ICD-10-CM | POA: Diagnosis not present

## 2017-02-15 DIAGNOSIS — M25561 Pain in right knee: Secondary | ICD-10-CM | POA: Diagnosis not present

## 2017-02-17 DIAGNOSIS — M25661 Stiffness of right knee, not elsewhere classified: Secondary | ICD-10-CM | POA: Diagnosis not present

## 2017-02-17 DIAGNOSIS — R262 Difficulty in walking, not elsewhere classified: Secondary | ICD-10-CM | POA: Diagnosis not present

## 2017-02-17 DIAGNOSIS — M25561 Pain in right knee: Secondary | ICD-10-CM | POA: Diagnosis not present

## 2017-02-21 DIAGNOSIS — M25561 Pain in right knee: Secondary | ICD-10-CM | POA: Diagnosis not present

## 2017-02-21 DIAGNOSIS — R262 Difficulty in walking, not elsewhere classified: Secondary | ICD-10-CM | POA: Diagnosis not present

## 2017-02-21 DIAGNOSIS — M25661 Stiffness of right knee, not elsewhere classified: Secondary | ICD-10-CM | POA: Diagnosis not present

## 2017-02-23 DIAGNOSIS — M25661 Stiffness of right knee, not elsewhere classified: Secondary | ICD-10-CM | POA: Diagnosis not present

## 2017-02-23 DIAGNOSIS — M25561 Pain in right knee: Secondary | ICD-10-CM | POA: Diagnosis not present

## 2017-02-23 DIAGNOSIS — M1711 Unilateral primary osteoarthritis, right knee: Secondary | ICD-10-CM | POA: Diagnosis not present

## 2017-03-01 DIAGNOSIS — M25561 Pain in right knee: Secondary | ICD-10-CM | POA: Diagnosis not present

## 2017-03-01 DIAGNOSIS — M1711 Unilateral primary osteoarthritis, right knee: Secondary | ICD-10-CM | POA: Diagnosis not present

## 2017-03-01 DIAGNOSIS — M25661 Stiffness of right knee, not elsewhere classified: Secondary | ICD-10-CM | POA: Diagnosis not present

## 2017-03-08 DIAGNOSIS — Z01419 Encounter for gynecological examination (general) (routine) without abnormal findings: Secondary | ICD-10-CM | POA: Diagnosis not present

## 2017-03-08 DIAGNOSIS — Z1212 Encounter for screening for malignant neoplasm of rectum: Secondary | ICD-10-CM | POA: Diagnosis not present

## 2017-04-05 DIAGNOSIS — Z13820 Encounter for screening for osteoporosis: Secondary | ICD-10-CM | POA: Diagnosis not present

## 2017-05-09 ENCOUNTER — Other Ambulatory Visit: Payer: Self-pay | Admitting: Internal Medicine

## 2017-06-05 DIAGNOSIS — M545 Low back pain: Secondary | ICD-10-CM | POA: Diagnosis not present

## 2017-06-08 DIAGNOSIS — Z0100 Encounter for examination of eyes and vision without abnormal findings: Secondary | ICD-10-CM | POA: Diagnosis not present

## 2017-06-12 ENCOUNTER — Encounter (HOSPITAL_COMMUNITY): Payer: Self-pay

## 2017-06-12 ENCOUNTER — Inpatient Hospital Stay (HOSPITAL_COMMUNITY)
Admission: EM | Admit: 2017-06-12 | Discharge: 2017-06-17 | DRG: 389 | Disposition: A | Discharge status: Home / Self Care | Payer: 59 | Attending: Internal Medicine | Admitting: Internal Medicine

## 2017-06-12 DIAGNOSIS — K56609 Unspecified intestinal obstruction, unspecified as to partial versus complete obstruction: Principal | ICD-10-CM | POA: Diagnosis present

## 2017-06-12 DIAGNOSIS — M109 Gout, unspecified: Secondary | ICD-10-CM | POA: Diagnosis present

## 2017-06-12 DIAGNOSIS — K589 Irritable bowel syndrome without diarrhea: Secondary | ICD-10-CM | POA: Diagnosis present

## 2017-06-12 DIAGNOSIS — R109 Unspecified abdominal pain: Secondary | ICD-10-CM | POA: Diagnosis not present

## 2017-06-12 DIAGNOSIS — K56699 Other intestinal obstruction unspecified as to partial versus complete obstruction: Secondary | ICD-10-CM | POA: Diagnosis not present

## 2017-06-12 DIAGNOSIS — I10 Essential (primary) hypertension: Secondary | ICD-10-CM | POA: Diagnosis present

## 2017-06-12 DIAGNOSIS — Z91040 Latex allergy status: Secondary | ICD-10-CM

## 2017-06-12 DIAGNOSIS — Z0189 Encounter for other specified special examinations: Secondary | ICD-10-CM

## 2017-06-12 DIAGNOSIS — M1711 Unilateral primary osteoarthritis, right knee: Secondary | ICD-10-CM | POA: Diagnosis present

## 2017-06-12 DIAGNOSIS — Z8261 Family history of arthritis: Secondary | ICD-10-CM

## 2017-06-12 DIAGNOSIS — Z9049 Acquired absence of other specified parts of digestive tract: Secondary | ICD-10-CM

## 2017-06-12 DIAGNOSIS — Z96651 Presence of right artificial knee joint: Secondary | ICD-10-CM | POA: Diagnosis present

## 2017-06-12 DIAGNOSIS — Z7952 Long term (current) use of systemic steroids: Secondary | ICD-10-CM

## 2017-06-12 DIAGNOSIS — Z4659 Encounter for fitting and adjustment of other gastrointestinal appliance and device: Secondary | ICD-10-CM

## 2017-06-12 DIAGNOSIS — M542 Cervicalgia: Secondary | ICD-10-CM | POA: Diagnosis present

## 2017-06-12 DIAGNOSIS — E876 Hypokalemia: Secondary | ICD-10-CM | POA: Diagnosis present

## 2017-06-12 DIAGNOSIS — M545 Low back pain: Secondary | ICD-10-CM | POA: Diagnosis present

## 2017-06-12 DIAGNOSIS — Z79899 Other long term (current) drug therapy: Secondary | ICD-10-CM

## 2017-06-12 DIAGNOSIS — Z6841 Body Mass Index (BMI) 40.0 and over, adult: Secondary | ICD-10-CM

## 2017-06-12 DIAGNOSIS — G8929 Other chronic pain: Secondary | ICD-10-CM | POA: Diagnosis present

## 2017-06-12 DIAGNOSIS — E7800 Pure hypercholesterolemia, unspecified: Secondary | ICD-10-CM | POA: Diagnosis present

## 2017-06-12 DIAGNOSIS — K219 Gastro-esophageal reflux disease without esophagitis: Secondary | ICD-10-CM | POA: Diagnosis present

## 2017-06-12 LAB — COMPREHENSIVE METABOLIC PANEL
ALT: 21 U/L (ref 14–54)
AST: 19 U/L (ref 15–41)
Albumin: 4.3 g/dL (ref 3.5–5.0)
Alkaline Phosphatase: 90 U/L (ref 38–126)
Anion gap: 8 (ref 5–15)
BILIRUBIN TOTAL: 0.4 mg/dL (ref 0.3–1.2)
BUN: 14 mg/dL (ref 6–20)
CHLORIDE: 97 mmol/L — AB (ref 101–111)
CO2: 32 mmol/L (ref 22–32)
CREATININE: 0.6 mg/dL (ref 0.44–1.00)
Calcium: 10.1 mg/dL (ref 8.9–10.3)
Glucose, Bld: 135 mg/dL — ABNORMAL HIGH (ref 65–99)
Potassium: 3.6 mmol/L (ref 3.5–5.1)
Sodium: 137 mmol/L (ref 135–145)
TOTAL PROTEIN: 8.7 g/dL — AB (ref 6.5–8.1)

## 2017-06-12 LAB — CBC
HCT: 40.8 % (ref 36.0–46.0)
Hemoglobin: 13.6 g/dL (ref 12.0–15.0)
MCH: 27 pg (ref 26.0–34.0)
MCHC: 33.3 g/dL (ref 30.0–36.0)
MCV: 81 fL (ref 78.0–100.0)
Platelets: 415 10*3/uL — ABNORMAL HIGH (ref 150–400)
RBC: 5.04 MIL/uL (ref 3.87–5.11)
RDW: 13.9 % (ref 11.5–15.5)
WBC: 14.5 10*3/uL — AB (ref 4.0–10.5)

## 2017-06-12 LAB — URINALYSIS, ROUTINE W REFLEX MICROSCOPIC
Bilirubin Urine: NEGATIVE
GLUCOSE, UA: NEGATIVE mg/dL
Hgb urine dipstick: NEGATIVE
KETONES UR: NEGATIVE mg/dL
LEUKOCYTES UA: NEGATIVE
NITRITE: NEGATIVE
PROTEIN: NEGATIVE mg/dL
Specific Gravity, Urine: 1.02 (ref 1.005–1.030)
pH: 8 (ref 5.0–8.0)

## 2017-06-12 LAB — LIPASE, BLOOD: LIPASE: 58 U/L — AB (ref 11–51)

## 2017-06-12 NOTE — ED Triage Notes (Signed)
Patient states she took glucosamine and B12,Vitamin D,calcium supplements between 1330 and 1400

## 2017-06-12 NOTE — ED Triage Notes (Signed)
Patient arrives by POV with complaints of "bad stomach pains in the top of my stomach". Patient states she has had these symptoms in the past. Followed up with primary DrMarland Kitchen Andrey Farmer given Rx for muscle spasm-patient takes she took the flexeril tonight with no relief-states she is also on prednisone for a pinched nerve in her back-left hip area. Patient states pain started at 1800-ate 1600-leftover from [employer]. At 1900-she ate some baked porkchop, broccoli and creamed corn. Patient states having chills. Denies vomiting-states "my bowels moved 3 x in an hour and a half-states BM soft.

## 2017-06-12 NOTE — ED Provider Notes (Signed)
TIME SEEN: 11:59 PM  CHIEF COMPLAINT: abdominal pain  HPI: patient is a 61 year old female with history of hypertension, hyperlipidemia, previous history of bowel obstruction, several hernia repairs, 2 C-sections and a cholecystectomy who presents emergency department with diffuse abdominal pain that started at 6 PM. She describes it as a spasm and cramping pain and worse with lying on her back. She has had chills and nausea but no vomiting or diarrhea. Last bowel movement was earlier today. She states she is no longer passing gas. She denies any bloody stools or melena. No dysuria, hematuria, vaginal bleeding or discharge. She states this does feel similar to her previous bowel obstructions.  She reports she has had to have surgery for her bowel obstructions before.  ROS: See HPI Constitutional: no fever  Eyes: no drainage  ENT: no runny nose   Cardiovascular:  no chest pain  Resp: no SOB  GI: no vomiting GU: no dysuria Integumentary: no rash  Allergy: no hives  Musculoskeletal: no leg swelling  Neurological: no slurred speech ROS otherwise negative  PAST MEDICAL HISTORY/PAST SURGICAL HISTORY:  Past Medical History:  Diagnosis Date  . ALLERGIC RHINITIS 09/11/2007  . Anemia, unspecified 08/31/2012  . Arthritis    "knees" (11/23/2016)  . BACK PAIN   . EXTERNAL OTITIS 09/11/2007  . GERD 06/06/2007  . GOUT 11/14/2007  . High cholesterol   . HYPERTENSION 06/06/2007  . INTERMITTENT VERTIGO 09/11/2007  . Irritable bowel syndrome 06/06/2007  . LOW BACK PAIN 06/06/2007  . OSTEOARTHRITIS 06/06/2007  . PONV (postoperative nausea and vomiting)   . Primary localized osteoarthritis of right knee   . SHOULDER PAIN, RIGHT 12/30/2008    MEDICATIONS:  Prior to Admission medications   Medication Sig Start Date End Date Taking? Authorizing Provider  amLODipine (NORVASC) 10 MG tablet TAKE ONE TABLET BY MOUTH ONCE DAILY 05/09/17  Yes [name], MD  CALCIUM PO Take 1 tablet by mouth daily.   Yes  [provider]  Cholecalciferol (VITAMIN D3) 5000 units CAPS Take 5,000 Units by mouth daily.    Yes [provider]  cyclobenzaprine (FLEXERIL) 5 MG tablet Take 1 tablet (5 mg total) by mouth 3 (three) times daily as needed for muscle spasms. Patient taking differently: Take 5 mg by mouth 3 (three) times daily as needed for muscle spasms.  08/26/16  Yes [name], MD  Glucosamine HCl (GLUCOSAMINE PO) Take 1 tablet by mouth daily.   Yes [provider]  irbesartan (AVAPRO) 300 MG tablet TAKE ONE TABLET BY MOUTH ONCE DAILY 05/09/17  Yes [name], MD  Multiple Vitamin (MULTIVITAMIN) tablet Take 1 tablet by mouth daily.   Yes [provider]  predniSONE (DELTASONE) 10 MG tablet Take 10 mg by mouth 4 (four) times daily.  06/05/17  Yes [provider]  acetaminophen (TYLENOL) 650 MG CR tablet Take 1,300 mg by mouth every 8 (eight) hours as needed for pain.    [provider]  celecoxib (CELEBREX) 200 MG capsule 1 tab po q day with food for pain and  swelling Patient taking differently: Take 200 mg by mouth daily as needed for moderate pain.  11/24/16   Shepperson, Kirstin, PA-C  dicyclomine (BENTYL) 20 MG tablet TAKE ONE TABLET BY MOUTH THREE TIMES DAILY BEFORE MEAL(S) Patient taking differently: TAKE ONE TABLET BY MOUTH THREE TIMES AS NEEDED FOR STOMACH CRAMPING 07/14/16   [name], MD  docusate sodium (COLACE) 100 MG capsule 1 tab 2 times a day while  on narcotics.  STOOL SOFTENER Patient not taking: Reported on 06/12/2017 11/24/16   Shepperson, Kirstin, PA-C  enoxaparin (LOVENOX) 30 MG/0.3ML injection Inject 0.3 mLs (30 mg total) into the skin every 12 (twelve) hours. Patient not taking: Reported on 06/12/2017 11/24/16   Julien Girt, PA-C  oxyCODONE (OXY IR/ROXICODONE) 5 MG immediate release tablet 1-2 tablets every 4-6 hrs as needed for breakthrough pain Patient not taking: Reported on 06/12/2017 11/24/16   Julien Girt, PA-C   oxyCODONE (OXYCONTIN) 20 mg 12 hr tablet Take 1 tablet (20 mg total) by mouth every 12 (twelve) hours. Patient not taking: Reported on 06/12/2017 11/24/16   Julien Girt, PA-C  pantoprazole (PROTONIX) 40 MG tablet Take 1 tablet (40 mg total) by mouth daily. Patient not taking: Reported on 06/12/2017 01/12/16   [name], MD  polyethylene glycol [facility] / Ethelene Hal) packet 17grams in 6 oz of water twice a day until bowel movement.  LAXITIVE.  Restart if two days since last bowel movement Patient not taking: Reported on 06/12/2017 11/24/16   Julien Girt, PA-C    ALLERGIES:  Allergies  Allergen Reactions  . Latex Itching    SOCIAL HISTORY:  Social History  Substance Use Topics  . Smoking status: Never Smoker  . Smokeless tobacco: Never Used  . Alcohol use No    FAMILY HISTORY: Family History  Problem Relation Age of Onset  . Arthritis Mother        RA  . Colon polyps Father 44  . Colon cancer Neg Hx     EXAM: BP (!) 178/106 (BP Location: Right Arm)   Pulse 71   Temp 98.4 F (36.9 C) (Oral)   Resp 16   Ht 4' 11.5" (1.511 m)   Wt 100.7 kg (222 lb)   SpO2 100%   BMI 44.09 kg/m  CONSTITUTIONAL: Alert and oriented and responds appropriately to questions. Appears uncomfortable, afebrile, obese HEAD: Normocephalic EYES: Conjunctivae clear, pupils appear equal, EOMI ENT: normal nose; moist mucous membranes NECK: Supple, no meningismus, no nuchal rigidity, no LAD  CARD: RRR; S1 and S2 appreciated; no murmurs, no clicks, no rubs, no gallops RESP: Normal chest excursion without splinting or tachypnea; breath sounds clear and equal bilaterally; no wheezes, no rhonchi, no rales, no hypoxia or respiratory distress, speaking full sentences ABD/GI: Normal bowel sounds; non-distended; soft, diffusely tender throughout the abdomen with intermittent guarding, no fluid wave or tympany present, non-peritoneal abdomen BACK:  The back appears normal and is non-tender to  palpation, there is no CVA tenderness EXT: Normal ROM in all joints; non-tender to palpation; no edema; normal capillary refill; no cyanosis, no calf tenderness or swelling    SKIN: Normal color for age and race; warm; no rash NEURO: Moves all extremities equally PSYCH: The patient's mood and manner are appropriate. Grooming and personal hygiene are appropriate.  MEDICAL DECISION MAKING: patient here with diffuse abdominal pain. I'm concerned for possible bowel obstruction. Labs show mild leukocytosis and slightly elevated lipase. Differential also includes pancreatitis, colitis, diverticulitis, appendicitis. We'll give pain medication, nausea medication, IV fluids. We'll obtain CT of her abdomen and pelvis.  ED PROGRESS: patient CT scan shows small bowel obstruction from adhesions. We will place nasogastric tube. We'll discuss with surgery. She will need admission.   3:00 AM  D/w Dr. Fredricka Bonine with general surgery who will see patient consult. She is asked for medicine admission. Patient has been updated with this plan and we will place a nasogastric tube. Patient states she has had 2 previous  bowel obstructions in both time she has had to end up going to the operating room with Dr. Derrell Lolling. States she has failed medical management in the past.  3:06 AM Discussed patient's case with hospitalist, Dr. Toniann Fail.  I have recommended admission and patient (and family if present) agree with this plan. Admitting physician will place admission orders.   I reviewed all nursing notes, vitals, pertinent previous records, EKGs, lab and urine results, imaging (as available).      Taralee Marcus, Layla Maw, DO 06/13/17 [phone]

## 2017-06-12 NOTE — ED Notes (Signed)
Pt c/o abd pain and nausea but no emesis onset 18:00 tonight. Pt started taking prednisone and calcium citrate recently.

## 2017-06-13 ENCOUNTER — Inpatient Hospital Stay (HOSPITAL_COMMUNITY): Payer: 59

## 2017-06-13 ENCOUNTER — Encounter (HOSPITAL_COMMUNITY): Payer: Self-pay | Admitting: Radiology

## 2017-06-13 ENCOUNTER — Emergency Department (HOSPITAL_COMMUNITY): Payer: 59

## 2017-06-13 DIAGNOSIS — E876 Hypokalemia: Secondary | ICD-10-CM | POA: Diagnosis present

## 2017-06-13 DIAGNOSIS — Z7952 Long term (current) use of systemic steroids: Secondary | ICD-10-CM | POA: Diagnosis not present

## 2017-06-13 DIAGNOSIS — Z96651 Presence of right artificial knee joint: Secondary | ICD-10-CM | POA: Diagnosis present

## 2017-06-13 DIAGNOSIS — M109 Gout, unspecified: Secondary | ICD-10-CM | POA: Diagnosis present

## 2017-06-13 DIAGNOSIS — Z6841 Body Mass Index (BMI) 40.0 and over, adult: Secondary | ICD-10-CM | POA: Diagnosis not present

## 2017-06-13 DIAGNOSIS — K589 Irritable bowel syndrome without diarrhea: Secondary | ICD-10-CM | POA: Diagnosis present

## 2017-06-13 DIAGNOSIS — M542 Cervicalgia: Secondary | ICD-10-CM | POA: Diagnosis present

## 2017-06-13 DIAGNOSIS — E7800 Pure hypercholesterolemia, unspecified: Secondary | ICD-10-CM | POA: Diagnosis present

## 2017-06-13 DIAGNOSIS — K56609 Unspecified intestinal obstruction, unspecified as to partial versus complete obstruction: Secondary | ICD-10-CM | POA: Diagnosis present

## 2017-06-13 DIAGNOSIS — R109 Unspecified abdominal pain: Secondary | ICD-10-CM | POA: Diagnosis not present

## 2017-06-13 DIAGNOSIS — I10 Essential (primary) hypertension: Secondary | ICD-10-CM

## 2017-06-13 DIAGNOSIS — Z4659 Encounter for fitting and adjustment of other gastrointestinal appliance and device: Secondary | ICD-10-CM | POA: Diagnosis not present

## 2017-06-13 DIAGNOSIS — M545 Low back pain: Secondary | ICD-10-CM | POA: Diagnosis present

## 2017-06-13 DIAGNOSIS — Z4682 Encounter for fitting and adjustment of non-vascular catheter: Secondary | ICD-10-CM | POA: Diagnosis not present

## 2017-06-13 DIAGNOSIS — K219 Gastro-esophageal reflux disease without esophagitis: Secondary | ICD-10-CM | POA: Diagnosis present

## 2017-06-13 DIAGNOSIS — Z91040 Latex allergy status: Secondary | ICD-10-CM | POA: Diagnosis not present

## 2017-06-13 DIAGNOSIS — K5651 Intestinal adhesions [bands], with partial obstruction: Secondary | ICD-10-CM | POA: Diagnosis not present

## 2017-06-13 DIAGNOSIS — Z8261 Family history of arthritis: Secondary | ICD-10-CM | POA: Diagnosis not present

## 2017-06-13 DIAGNOSIS — M1711 Unilateral primary osteoarthritis, right knee: Secondary | ICD-10-CM | POA: Diagnosis present

## 2017-06-13 DIAGNOSIS — Z9049 Acquired absence of other specified parts of digestive tract: Secondary | ICD-10-CM | POA: Diagnosis not present

## 2017-06-13 DIAGNOSIS — G8929 Other chronic pain: Secondary | ICD-10-CM | POA: Diagnosis present

## 2017-06-13 DIAGNOSIS — Z0189 Encounter for other specified special examinations: Secondary | ICD-10-CM | POA: Diagnosis not present

## 2017-06-13 DIAGNOSIS — Z79899 Other long term (current) drug therapy: Secondary | ICD-10-CM | POA: Diagnosis not present

## 2017-06-13 LAB — CBC
HCT: 38.7 % (ref 36.0–46.0)
HEMOGLOBIN: 12.7 g/dL (ref 12.0–15.0)
MCH: 26.5 pg (ref 26.0–34.0)
MCHC: 32.8 g/dL (ref 30.0–36.0)
MCV: 80.6 fL (ref 78.0–100.0)
PLATELETS: 331 10*3/uL (ref 150–400)
RBC: 4.8 MIL/uL (ref 3.87–5.11)
RDW: 13.7 % (ref 11.5–15.5)
WBC: 13.3 10*3/uL — ABNORMAL HIGH (ref 4.0–10.5)

## 2017-06-13 LAB — BASIC METABOLIC PANEL
Anion gap: 11 (ref 5–15)
BUN: 11 mg/dL (ref 6–20)
CALCIUM: 9.3 mg/dL (ref 8.9–10.3)
CHLORIDE: 95 mmol/L — AB (ref 101–111)
CO2: 28 mmol/L (ref 22–32)
CREATININE: 0.58 mg/dL (ref 0.44–1.00)
GFR calc non Af Amer: >60 mL/min (ref >60)
GLUCOSE: 179 mg/dL — AB (ref 65–99)
Potassium: 3.3 mmol/L — ABNORMAL LOW (ref 3.5–5.1)
Sodium: 134 mmol/L — ABNORMAL LOW (ref 135–145)

## 2017-06-13 LAB — GLUCOSE, CAPILLARY
Glucose-Capillary: 157 mg/dL — ABNORMAL HIGH (ref 65–99)
Glucose-Capillary: 92 mg/dL (ref 65–99)

## 2017-06-13 LAB — HIV ANTIBODY (ROUTINE TESTING W REFLEX): HIV Screen 4th Generation wRfx: NONREACTIVE

## 2017-06-13 MED ORDER — ONDANSETRON HCL 4 MG/2ML IJ SOLN
4.0000 mg | Freq: Four times a day (QID) | INTRAMUSCULAR | Status: DC | PRN
  Administered 2017-06-13 (×2): 4 mg via INTRAVENOUS
  Filled 2017-06-13 (×2): qty 2

## 2017-06-13 MED ORDER — ACETAMINOPHEN 325 MG PO TABS: 650.0000 mg | ORAL_TABLET | Freq: Four times a day (QID) | ORAL | Status: DC | PRN

## 2017-06-13 MED ORDER — DIATRIZOATE MEGLUMINE & SODIUM 66-10 % PO SOLN
90.0000 mL | Freq: Once | ORAL | Status: AC
  Administered 2017-06-13: 90 mL via NASOGASTRIC
  Filled 2017-06-13: qty 90

## 2017-06-13 MED ORDER — ONDANSETRON HCL 4 MG/2ML IJ SOLN
4.0000 mg | Freq: Once | INTRAMUSCULAR | Status: AC
  Administered 2017-06-13: 4 mg via INTRAVENOUS
  Filled 2017-06-13: qty 2

## 2017-06-13 MED ORDER — IOPAMIDOL (ISOVUE-300) INJECTION 61%
INTRAVENOUS | Status: AC
  Filled 2017-06-13: qty 100

## 2017-06-13 MED ORDER — BENZOCAINE 20 % MT AERO
1.0000 "application " | INHALATION_SPRAY | Freq: Once | OROMUCOSAL | Status: AC
  Administered 2017-06-13: 1 via OROMUCOSAL
  Filled 2017-06-13: qty 57

## 2017-06-13 MED ORDER — MORPHINE SULFATE (PF) 2 MG/ML IV SOLN
1.0000 mg | INTRAVENOUS | Status: DC | PRN
  Administered 2017-06-13 – 2017-06-16 (×5): 1 mg via INTRAVENOUS
  Filled 2017-06-13 (×5): qty 1

## 2017-06-13 MED ORDER — HYDROMORPHONE HCL 1 MG/ML IJ SOLN
1.0000 mg | Freq: Once | INTRAMUSCULAR | Status: AC
  Administered 2017-06-13: 1 mg via INTRAVENOUS
  Filled 2017-06-13: qty 1

## 2017-06-13 MED ORDER — IOPAMIDOL (ISOVUE-300) INJECTION 61%
100.0000 mL | Freq: Once | INTRAVENOUS | Status: AC | PRN
  Administered 2017-06-13: 100 mL via INTRAVENOUS

## 2017-06-13 MED ORDER — HYDRALAZINE HCL 20 MG/ML IJ SOLN
10.0000 mg | INTRAMUSCULAR | Status: DC | PRN
  Administered 2017-06-13: 10 mg via INTRAVENOUS
  Filled 2017-06-13 (×3): qty 0.5

## 2017-06-13 MED ORDER — ONDANSETRON HCL 4 MG PO TABS: 4.0000 mg | ORAL_TABLET | Freq: Four times a day (QID) | ORAL | Status: DC | PRN

## 2017-06-13 MED ORDER — ACETAMINOPHEN 650 MG RE SUPP: 650.0000 mg | Freq: Four times a day (QID) | RECTAL | Status: DC | PRN

## 2017-06-13 MED ORDER — MENTHOL 3 MG MT LOZG
1.0000 | LOZENGE | OROMUCOSAL | Status: DC | PRN
  Administered 2017-06-13: 3 mg via ORAL
  Filled 2017-06-13: qty 9

## 2017-06-13 MED ORDER — LIP MEDEX EX OINT
TOPICAL_OINTMENT | CUTANEOUS | Status: DC | PRN
  Administered 2017-06-13: 14:00:00 via TOPICAL
  Filled 2017-06-13: qty 7

## 2017-06-13 MED ORDER — DEXTROSE-NACL 5-0.9 % IV SOLN
INTRAVENOUS | Status: DC
  Administered 2017-06-13: 07:00:00 via INTRAVENOUS

## 2017-06-13 MED ORDER — SODIUM CHLORIDE 0.9 % IV BOLUS (SEPSIS)
1000.0000 mL | Freq: Once | INTRAVENOUS | Status: AC
  Administered 2017-06-13: 1000 mL via INTRAVENOUS

## 2017-06-13 MED ORDER — PANTOPRAZOLE SODIUM 40 MG IV SOLR
40.0000 mg | INTRAVENOUS | Status: DC
  Administered 2017-06-13 – 2017-06-14 (×2): 40 mg via INTRAVENOUS
  Filled 2017-06-13 (×2): qty 40

## 2017-06-13 MED ORDER — SODIUM CHLORIDE 0.9 % IV SOLN: INTRAVENOUS | Status: DC

## 2017-06-13 MED ORDER — PHENOL 1.4 % MT LIQD
1.0000 | OROMUCOSAL | Status: DC | PRN
  Administered 2017-06-13: 1 via OROMUCOSAL
  Filled 2017-06-13: qty 177

## 2017-06-13 NOTE — Progress Notes (Signed)
Pt complains of chest pain. Vitals show that BP is elevated but HR is 85. Hydralazine is administered for BP, patient was repositioned in chair. Patient stated that she thinks this is indigestion.Pt became nauseous and zofran was given. MD was notified.

## 2017-06-13 NOTE — Consult Note (Signed)
Surgical Consultation Requesting provider: Dr. Toniann Fail  CC: abdominal pain  HPI: 61yo woman with obesity, hypertension and multiple prior surgeries (see below)/ bowel obstructions presented to the emergency department last night with abdominal pain. Quality is spasm-like. Location is diffuse. Aggravated by lying on her back. Associated chills and nausea but no emesis or diarrhea; last bowel movement was yesterday. No melena or hematochezia. Her pain is similar to previous obstructions. She had an NG placed this morning and feels a little better.   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 Lurene Shadow (he notes she'd had multiple previous operations already, including cholecystectomy and c-sections, the most recent being repair of lower midline hernia with marlex plug and mesh)  Nov 2002: repair of recurrent ventral hernia with prolene mesh (unclear if intraperitoneal...seprafilm was used)- Dr. Lurene Shadow  March 2004: repair of recurrent ventral hernia with incarcerated small bowel- seprafilm used, polypropylene mesh patch- Dr. Lurene Shadow  Nov 2005: repair of recurrent, incarcerated ventral hernia with onlay Proceed mesh -Dr. Derrell Lolling  Jan 2011: lap converted to open repair of recurrent incarcerated ventral hernia with physio mesh, 75 minutes of adhesiolysis, Dr Derrell Lolling. Dr. Vincente Poli performed BSO at that time.    Allergies  Allergen Reactions  . Latex Itching    Past Medical History:  Diagnosis Date  . ALLERGIC RHINITIS 09/11/2007  . Anemia, unspecified 08/31/2012  . Arthritis    "knees" (11/23/2016)  . BACK PAIN   . EXTERNAL OTITIS 09/11/2007  . GERD 06/06/2007  . GOUT 11/14/2007  . High cholesterol   . HYPERTENSION 06/06/2007  . INTERMITTENT VERTIGO 09/11/2007  . Irritable bowel syndrome 06/06/2007  . LOW BACK PAIN 06/06/2007  . OSTEOARTHRITIS 06/06/2007  . PONV (postoperative nausea and vomiting)   . Primary localized  osteoarthritis of right knee   . SHOULDER PAIN, RIGHT 12/30/2008    Past Surgical History:  Procedure Laterality Date  . BILATERAL SALPINGOOPHORECTOMY    . CESAREAN SECTION  1987, 1991  . COLONOSCOPY     pt stated 2015 or 2016 unknown md  . EXPLORATORY LAPAROTOMY     bowel obstruction  . INGUINAL HERNIA REPAIR Bilateral    numerous hernia repairs 1996 to 2011  . JOINT REPLACEMENT    . KNEE ARTHROSCOPY Bilateral 2013 - 11/11/2015  . LAPAROSCOPIC CHOLECYSTECTOMY  1991  . s/p incarcerated hernia and ovary cyst Jan 2011  2011  . TOTAL KNEE ARTHROPLASTY Right 11/21/2016   Procedure: RIGHT TOTAL KNEE ARTHROPLASTY;  Surgeon: Salvatore Marvel, MD;  Location: [facility] OR;  Service: Orthopedics;  Laterality: Right;  . UMBILICAL HERNIA REPAIR     numerous hernia repairs 1996 to 2011    Family History  Problem Relation Age of Onset  . Arthritis Mother        RA  . Colon polyps Father 20  . Colon cancer Neg Hx     Social History   Social History  . Marital status: Married    Spouse name: N/A  . Number of children: N/A  . Years of education: N/A   Occupational History  . unemployed recently laid off    Social History Main Topics  . Smoking status: Never Smoker  . Smokeless tobacco: Never Used  . Alcohol use No  . Drug use: No  . Sexual activity: No   Other Topics Concern  . None   Social History Narrative  . None    No current facility-administered medications on file prior to encounter.  Current Outpatient Prescriptions on File Prior to Encounter  Medication Sig Dispense Refill  . amLODipine (NORVASC) 10 MG tablet TAKE ONE TABLET BY MOUTH ONCE DAILY 90 tablet 1  . Cholecalciferol (VITAMIN D3) 5000 units CAPS Take 5,000 Units by mouth daily.     . cyclobenzaprine (FLEXERIL) 5 MG tablet Take 1 tablet (5 mg total) by mouth 3 (three) times daily as needed for muscle spasms. (Patient taking differently: Take 5 mg by mouth 3 (three) times daily as needed for muscle spasms. ) 60  tablet 1  . irbesartan (AVAPRO) 300 MG tablet TAKE ONE TABLET BY MOUTH ONCE DAILY 90 tablet 1  . Multiple Vitamin (MULTIVITAMIN) tablet Take 1 tablet by mouth daily.    Marland Kitchen acetaminophen (TYLENOL) 650 MG CR tablet Take 1,300 mg by mouth every 8 (eight) hours as needed for pain.    . celecoxib (CELEBREX) 200 MG capsule 1 tab po q day with food for pain and  swelling (Patient taking differently: Take 200 mg by mouth daily as needed for moderate pain. ) 30 capsule 0  . dicyclomine (BENTYL) 20 MG tablet TAKE ONE TABLET BY MOUTH THREE TIMES DAILY BEFORE MEAL(S) (Patient taking differently: TAKE ONE TABLET BY MOUTH THREE TIMES AS NEEDED FOR STOMACH CRAMPING) 50 tablet 1  . docusate sodium (COLACE) 100 MG capsule 1 tab 2 times a day while on narcotics.  STOOL SOFTENER (Patient not taking: Reported on 06/12/2017) 60 capsule 0  . enoxaparin (LOVENOX) 30 MG/0.3ML injection Inject 0.3 mLs (30 mg total) into the skin every 12 (twelve) hours. (Patient not taking: Reported on 06/12/2017) 30 Syringe 0  . oxyCODONE (OXY IR/ROXICODONE) 5 MG immediate release tablet 1-2 tablets every 4-6 hrs as needed for breakthrough pain (Patient not taking: Reported on 06/12/2017) 80 tablet 0  . oxyCODONE (OXYCONTIN) 20 mg 12 hr tablet Take 1 tablet (20 mg total) by mouth every 12 (twelve) hours. (Patient not taking: Reported on 06/12/2017) 14 tablet 0  . pantoprazole (PROTONIX) 40 MG tablet Take 1 tablet (40 mg total) by mouth daily. (Patient not taking: Reported on 06/12/2017) 90 tablet 3  . polyethylene glycol (MIRALAX / GLYCOLAX) packet 17grams in 6 oz of water twice a day until bowel movement.  LAXITIVE.  Restart if two days since last bowel movement (Patient not taking: Reported on 06/12/2017) 14 each 0    Review of Systems: a complete, 10pt review of systems was completed with pertinent positives and negatives as documented in the HPI.   Physical Exam: Vitals:   06/13/17 0430 06/13/17 0530  BP: (!) 166/87 (!) 149/89  Pulse: 64  63  Resp:  18  Temp:  (!) 97.5 F (36.4 C)  SpO2: 100% 98%   Gen: A&Ox3, no distress  Head: normocephalic, atraumatic, EOMI, anicteric.  Neck: supple without mass or thyromegaly Chest: unlabored respirations, symmetrical air entry Cardiovascular: RRR with palpable distal pulses Abdomen: Soft, obese, mildly distended. Diffusely mildly tender without peritoneal signs.  Extremities: warm, without edema, no deformities  Neuro: grossly intact Psych: appropriate mood and affect, insight intact Skin: warm and dry  CBC Latest Ref Rng & Units 06/13/2017 06/12/2017 11/24/2016  WBC 4.0 - 10.5 K/uL 13.3(H) 14.5(H) 10.4  Hemoglobin 12.0 - 15.0 g/dL 96.0 45.4 10.4(L)  Hematocrit 36.0 - 46.0 % 38.7 40.8 32.8(L)  Platelets 150 - 400 K/uL 331 415(H) 260    CMP Latest Ref Rng & Units 06/12/2017 11/24/2016 11/23/2016  Glucose 65 - 99 mg/dL 098(J) 191(Y) 782(N)  BUN 6 - 20 mg/dL  14 13 10   Creatinine 0.44 - 1.00 mg/dL 8.11 8.86 7.73  Sodium 135 - 145 mmol/L 137 135 138  Potassium 3.5 - 5.1 mmol/L 3.6 4.0 3.9  Chloride 101 - 111 mmol/L 97(L) 99(L) 102  CO2 22 - 32 mmol/L 32 26 27  Calcium 8.9 - 10.3 mg/dL 73.6 8.9 9.1  Total Protein 6.5 - 8.1 g/dL 6.8(D) - -  Total Bilirubin 0.3 - 1.2 mg/dL 0.4 - -  Alkaline Phos 38 - 126 U/L 90 - -  AST 15 - 41 U/L 19 - -  ALT 14 - 54 U/L 21 - -    Lab Results  Component Value Date   INR 1.06 11/10/2016    Imaging: CT ABDOMEN AND PELVIS WITH CONTRAST  TECHNIQUE: Multidetector CT imaging of the abdomen and pelvis was performed using the standard protocol following bolus administration of intravenous contrast.  CONTRAST:  ISOVUE-300 IOPAMIDOL (ISOVUE-300) INJECTION 61%  COMPARISON:  07/17/2012  FINDINGS: Lower chest: Lung bases demonstrate no acute consolidation or pleural effusion. Borderline cardiomegaly.  Hepatobiliary: No focal liver abnormality is seen. Status post cholecystectomy. No biliary dilatation.  Pancreas: Unremarkable. No  pancreatic ductal dilatation or surrounding inflammatory changes.  Spleen: Normal in size without focal abnormality.  Adrenals/Urinary Tract: Adrenal glands are within normal limits. Kidneys show no hydronephrosis. Nonspecific mild perinephric fat stranding. Bladder unremarkable.  Stomach/Bowel: Stomach is nonenlarged. Fluid-filled borderline enlarged small bowel within the anterior lower abdomen and pelvis measuring up to 3.1 cm. Dilated loops of small bowel are closely apposed to the anterior abdominal wall. Decompressed distal small bowel loops. Normal appendix. No colon wall thickening.  Vascular/Lymphatic: No significant vascular findings are present. No enlarged abdominal or pelvic lymph nodes.  Reproductive: Uterus and bilateral adnexa are unremarkable.  Other: Midline scarring.  Negative for free air or free fluid  Musculoskeletal: No acute or significant osseous findings.  IMPRESSION: 1. Multiple fluid-filled borderline to slightly enlarged loops of small bowel within the anterior abdomen and pelvis, findings are suspicious for developing bowel obstruction. Given that the dilated small bowel loops are closely apposed to the anterior abdominal wall, suspect that the findings are related to adhesions. 2. There are no other acute abnormalities visualized.   Electronically Signed   By: Jasmine Pang M.D.   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  -NPO, IVF -NGT decompression, SBO protocol -Serial labs/exams  Will continue to follow. Hopefully she will not require another operation.    Phylliss Blakes, MD [facility], [place] Pager [phone]

## 2017-06-13 NOTE — Progress Notes (Signed)
PROGRESS NOTE    Bianca Campbell  ZJQ:[id number] DOB: 02/27/1956 DOA: 06/12/2017 PCP: [name], MD  Brief Narrative:Bianca Campbell is a 61 y.o. female with history of hypertension, multiple abdominal surgeries and episodes of SBO admitted with recurrent small bowel obstruction. NG tube placed in the ER early this morning.   Assessment & Plan:   Principal Problem:   SBO (small bowel obstruction) (HCC) -Continue NG tube to intermittent low wall suction, nothing by mouth, IV fluids -On small bowel protocol per [facility] -may need surgery if she does not improve with conservative management    Essential hypertension -Continue PRN hydralazine for now  DVT prophylaxis: Add Lovenox Code Status: Full code Family Communication: No family at bedside Disposition Plan: Home when improved  Consultants:   CCS   Subjective: -Uncomfortable with NG tube in, nausea is little better, last bowel movement was prior to admission yesterday evening  Objective: Vitals:   06/13/17 0330 06/13/17 0429 06/13/17 0430 06/13/17 0530  BP: 116/82 (!) 152/92 (!) 166/87 (!) 149/89  Pulse: 64 (!) 57 64 63  Resp:  18  18  Temp:    (!) 97.5 F (36.4 C)  TempSrc:    Axillary  SpO2: 100% 100% 100% 98%  Weight:      Height:        Intake/Output Summary (Last 24 hours) at 06/13/17 1243 Last data filed at 06/13/17 1000  Gross per 24 hour  Intake              230 ml  Output              400 ml  Net             -170 ml   Filed Weights   06/12/17 2158  Weight: 100.7 kg (222 lb)    Examination:  General exam: Appears calm but uncomfortable and resting in bed, NG in situ Respiratory system: Clear to auscultation. Respiratory effort normal. Cardiovascular system: S1 & S2 heard, RRR. No JVD, murmurs, rubs Gastrointestinal system: Abdomen is Soft obese, slightly distended, bowel sounds diminished, nontender Central nervous system: Alert and oriented. No focal neurological deficits. Extremities:  Symmetric 5 x 5 power. Skin: No rashes, lesions or ulcers Psychiatry: Judgement and insight appear normal. Mood & affect appropriate.     Data Reviewed:   CBC:  Recent Labs Lab 06/12/17 2219 06/13/17 0605  WBC 14.5* 13.3*  HGB 13.6 12.7  HCT 40.8 38.7  MCV 81.0 80.6  PLT 415* 331   Basic Metabolic Panel:  Recent Labs Lab 06/12/17 2219 06/13/17 0605  NA 137 134*  K 3.6 3.3*  CL 97* 95*  CO2 32 28  GLUCOSE 135* 179*  BUN 14 11  CREATININE 0.60 0.58  CALCIUM 10.1 9.3   GFR: Estimated Creatinine Clearance: 79 mL/min (by C-G formula based on SCr of 0.58 mg/dL). Liver Function Tests:  Recent Labs Lab 06/12/17 2219  AST 19  ALT 21  ALKPHOS 90  BILITOT 0.4  PROT 8.7*  ALBUMIN 4.3    Recent Labs Lab 06/12/17 2219  LIPASE 58*   No results for input(s): AMMONIA in the last 168 hours. Coagulation Profile: No results for input(s): INR, PROTIME in the last 168 hours. Cardiac Enzymes: No results for input(s): CKTOTAL, CKMB, CKMBINDEX, TROPONINI in the last 168 hours. BNP (last 3 results) No results for input(s): PROBNP in the last 8760 hours. HbA1C: No results for input(s): HGBA1C in the last 72 hours. CBG: No results for  input(s): GLUCAP in the last 168 hours. Lipid Profile: No results for input(s): CHOL, HDL, LDLCALC, TRIG, CHOLHDL, LDLDIRECT in the last 72 hours. Thyroid Function Tests: No results for input(s): TSH, T4TOTAL, FREET4, T3FREE, THYROIDAB in the last 72 hours. Anemia Panel: No results for input(s): VITAMINB12, FOLATE, FERRITIN, TIBC, IRON, RETICCTPCT in the last 72 hours. Urine analysis:    Component Value Date/Time   COLORURINE YELLOW 06/12/2017 2218   APPEARANCEUR CLEAR 06/12/2017 2218   LABSPEC 1.020 06/12/2017 2218   PHURINE 8.0 06/12/2017 2218   GLUCOSEU NEGATIVE 06/12/2017 2218   GLUCOSEU NEGATIVE 01/12/2016 1716   HGBUR NEGATIVE 06/12/2017 2218   BILIRUBINUR NEGATIVE 06/12/2017 2218   KETONESUR NEGATIVE 06/12/2017 2218    PROTEINUR NEGATIVE 06/12/2017 2218   UROBILINOGEN 0.2 01/12/2016 1716   NITRITE NEGATIVE 06/12/2017 2218   LEUKOCYTESUR NEGATIVE 06/12/2017 2218   Sepsis Labs: @LABRCNTIP (procalcitonin:4,lacticidven:4)  )No results found for this or any previous visit (from the past 240 hour(s)).       Radiology Studies: Ct Abdomen Pelvis W Contrast  Result Date: 06/13/2017 CLINICAL DATA:  Upper abdominal spasms with nausea and loose stools EXAM: CT ABDOMEN AND PELVIS WITH CONTRAST TECHNIQUE: Multidetector CT imaging of the abdomen and pelvis was performed using the standard protocol following bolus administration of intravenous contrast. CONTRAST:  ISOVUE-300 IOPAMIDOL (ISOVUE-300) INJECTION 61% COMPARISON:  07/17/2012 FINDINGS: Lower chest: Lung bases demonstrate no acute consolidation or pleural effusion. Borderline cardiomegaly. Hepatobiliary: No focal liver abnormality is seen. Status post cholecystectomy. No biliary dilatation. Pancreas: Unremarkable. No pancreatic ductal dilatation or surrounding inflammatory changes. Spleen: Normal in size without focal abnormality. Adrenals/Urinary Tract: Adrenal glands are within normal limits. Kidneys show no hydronephrosis. Nonspecific mild perinephric fat stranding. Bladder unremarkable. Stomach/Bowel: Stomach is nonenlarged. Fluid-filled borderline enlarged small bowel within the anterior lower abdomen and pelvis measuring up to 3.1 cm. Dilated loops of small bowel are closely apposed to the anterior abdominal wall. Decompressed distal small bowel loops. Normal appendix. No colon wall thickening. Vascular/Lymphatic: No significant vascular findings are present. No enlarged abdominal or pelvic lymph nodes. Reproductive: Uterus and bilateral adnexa are unremarkable. Other: Midline scarring.  Negative for free air or free fluid Musculoskeletal: No acute or significant osseous findings. IMPRESSION: 1. Multiple fluid-filled borderline to slightly enlarged loops of  small bowel within the anterior abdomen and pelvis, findings are suspicious for developing bowel obstruction. Given that the dilated small bowel loops are closely apposed to the anterior abdominal wall, suspect that the findings are related to adhesions. 2. There are no other acute abnormalities visualized. Electronically Signed   By: Jasmine Pang M.D.   On: 06/13/2017 02:31   Dg Abd Portable 1v  Result Date: 06/13/2017 CLINICAL DATA:  NG tube placement EXAM: PORTABLE ABDOMEN - 1 VIEW COMPARISON:  None. FINDINGS: The tip and side port of the nasogastric tube are in the stomach. No acute abdominal findings. Excreted contrast material in the renal pelvises and urinary bladder%period% IMPRESSION: Tip and side port of NG tube are in the stomach. Electronically Signed   By: Deatra Robinson M.D.   On: 06/13/2017 06:30        Scheduled Meds: . iopamidol       Continuous Infusions: . dextrose 5 % and 0.9% NaCl 75 mL/hr at 06/13/17 0656     LOS: 0 days    Time spent:    Zannie Cove, MD [facility] Pager [phone]  If 7PM-7AM, please contact night-coverage [email or web address] Password [facility] 06/13/2017, 12:43 PM

## 2017-06-13 NOTE — H&P (Signed)
History and Physical    Bianca Campbell DOB: [birth year] DOA: 06/12/2017  PCP: [name], MD  Patient coming from: Home.  Chief Complaint: Abdominal pain.  HPI: Bianca Campbell is a 61 y.o. female with history of hypertension and recently was placed on prednisone for low back pain presents to the ER with complaints of abdominal pain and nausea. Patient's symptoms started last evening. Pain is diffuse spasm-like. Had nausea denies any vomiting.   ED Course: In the ER CT scan of the abdomen shows possible early signs of obstruction and on-call surgeon has been consulted. NG tube was placed and patient has been admitted for further management of possible developing small bowel obstruction. Patient's last bowel movement was last evening around 7:00 after the pain started.  Review of Systems: As per HPI, rest all negative.   Past Medical History:  Diagnosis Date  . ALLERGIC RHINITIS 09/11/2007  . Anemia, unspecified 08/31/2012  . Arthritis    "knees" (11/23/2016)  . BACK PAIN   . EXTERNAL OTITIS 09/11/2007  . GERD 06/06/2007  . GOUT 11/14/2007  . High cholesterol   . HYPERTENSION 06/06/2007  . INTERMITTENT VERTIGO 09/11/2007  . Irritable bowel syndrome 06/06/2007  . LOW BACK PAIN 06/06/2007  . OSTEOARTHRITIS 06/06/2007  . PONV (postoperative nausea and vomiting)   . Primary localized osteoarthritis of right knee   . SHOULDER PAIN, RIGHT 12/30/2008    Past Surgical History:  Procedure Laterality Date  . BILATERAL SALPINGOOPHORECTOMY    . CESAREAN SECTION  1987, 1991  . COLONOSCOPY     pt stated 2015 or 2016 unknown md  . EXPLORATORY LAPAROTOMY     bowel obstruction  . INGUINAL HERNIA REPAIR Bilateral    numerous hernia repairs 1996 to 2011  . JOINT REPLACEMENT    . KNEE ARTHROSCOPY Bilateral 2013 - 11/11/2015  . LAPAROSCOPIC CHOLECYSTECTOMY  1991  . s/p incarcerated hernia and ovary cyst Jan 2011  2011  . TOTAL KNEE ARTHROPLASTY Right 11/21/2016   Procedure:  RIGHT TOTAL KNEE ARTHROPLASTY;  Surgeon: Salvatore Marvel, MD;  Location: [facility] OR;  Service: Orthopedics;  Laterality: Right;  . UMBILICAL HERNIA REPAIR     numerous hernia repairs 1996 to 2011     reports that she has never smoked. She has never used smokeless tobacco. She reports that she does not drink alcohol or use drugs.  Allergies  Allergen Reactions  . Latex Itching    Family History  Problem Relation Age of Onset  . Arthritis Mother        RA  . Colon polyps Father 32  . Colon cancer Neg Hx     Prior to Admission medications   Medication Sig Start Date End Date Taking? Authorizing Provider  amLODipine (NORVASC) 10 MG tablet TAKE ONE TABLET BY MOUTH ONCE DAILY 05/09/17  Yes [name], MD  CALCIUM PO Take 1 tablet by mouth daily.   Yes [provider]  Cholecalciferol (VITAMIN D3) 5000 units CAPS Take 5,000 Units by mouth daily.    Yes [provider]  cyclobenzaprine (FLEXERIL) 5 MG tablet Take 1 tablet (5 mg total) by mouth 3 (three) times daily as needed for muscle spasms. Patient taking differently: Take 5 mg by mouth 3 (three) times daily as needed for muscle spasms.  08/26/16  Yes [name], MD  Glucosamine HCl (GLUCOSAMINE PO) Take 1 tablet by mouth daily.   Yes [provider]  irbesartan (AVAPRO) 300 MG tablet TAKE ONE TABLET  BY MOUTH ONCE DAILY 05/09/17  Yes [name], MD  Multiple Vitamin (MULTIVITAMIN) tablet Take 1 tablet by mouth daily.   Yes [provider]  predniSONE (DELTASONE) 10 MG tablet Take 10 mg by mouth 4 (four) times daily.  06/05/17  Yes [provider]  acetaminophen (TYLENOL) 650 MG CR tablet Take 1,300 mg by mouth every 8 (eight) hours as needed for pain.    [provider]  celecoxib (CELEBREX) 200 MG capsule 1 tab po q day with food for pain and  swelling Patient taking differently: Take 200 mg by mouth daily as needed for moderate pain.  11/24/16   Shepperson, Kirstin, PA-C  dicyclomine  (BENTYL) 20 MG tablet TAKE ONE TABLET BY MOUTH THREE TIMES DAILY BEFORE MEAL(S) Patient taking differently: TAKE ONE TABLET BY MOUTH THREE TIMES AS NEEDED FOR STOMACH CRAMPING 07/14/16   [name], MD  docusate sodium (COLACE) 100 MG capsule 1 tab 2 times a day while on narcotics.  STOOL SOFTENER Patient not taking: Reported on 06/12/2017 11/24/16   Shepperson, Kirstin, PA-C  enoxaparin (LOVENOX) 30 MG/0.3ML injection Inject 0.3 mLs (30 mg total) into the skin every 12 (twelve) hours. Patient not taking: Reported on 06/12/2017 11/24/16   Julien Girt, PA-C  oxyCODONE (OXY IR/ROXICODONE) 5 MG immediate release tablet 1-2 tablets every 4-6 hrs as needed for breakthrough pain Patient not taking: Reported on 06/12/2017 11/24/16   Julien Girt, PA-C  oxyCODONE (OXYCONTIN) 20 mg 12 hr tablet Take 1 tablet (20 mg total) by mouth every 12 (twelve) hours. Patient not taking: Reported on 06/12/2017 11/24/16   Julien Girt, PA-C  pantoprazole (PROTONIX) 40 MG tablet Take 1 tablet (40 mg total) by mouth daily. Patient not taking: Reported on 06/12/2017 01/12/16   [name], MD  polyethylene glycol [facility] Ethelene Hal) packet 17grams in 6 oz of water twice a day until bowel movement.  LAXITIVE.  Restart if two days since last bowel movement Patient not taking: Reported on 06/12/2017 11/24/16   Julien Girt, PA-C    Physical Exam: Vitals:   06/12/17 2158 06/13/17 0041 06/13/17 0045 06/13/17 0145  BP: (!) 178/106 114/70 (!) 159/80 (!) 152/92  Pulse: 71 69 63 64  Resp: 16 18    Temp: 98.4 F (36.9 C) 98 F (36.7 C)    TempSrc: Oral Oral    SpO2: 100% 100% 99% 100%  Weight: 100.7 kg (222 lb)     Height: 4' 11.5" (1.511 m)         Constitutional: Moderately built and nourished. Vitals:   06/12/17 2158 06/13/17 0041 06/13/17 0045 06/13/17 0145  BP: (!) 178/106 114/70 (!) 159/80 (!) 152/92  Pulse: 71 69 63 64  Resp: 16 18    Temp: 98.4 F (36.9 C) 98 F (36.7 C)    TempSrc:  Oral Oral    SpO2: 100% 100% 99% 100%  Weight: 100.7 kg (222 lb)     Height: 4' 11.5" (1.511 m)      Eyes: Anicteric no pallor. ENMT: No discharge from the ears eyes nose and mouth. Neck: No mass. No neck rigidity. Respiratory: No rhonchi or crepitations. Cardiovascular: S1 and S2 heard no murmurs appreciated. Abdomen: Distended nontender bowel sounds not appreciated. Musculoskeletal: No edema. Skin: No rash. Neurologic: Alert awake oriented to time place and person. Moves all extremitiees. Psychiatric: Appears normal.   Labs on Admission: I have personally reviewed following labs and imaging studies  CBC:  Recent Labs Lab 06/12/17 2219  WBC  14.5*  HGB 13.6  HCT 40.8  MCV 81.0  PLT 415*   Basic Metabolic Panel:  Recent Labs Lab 06/12/17 2219  NA 137  K 3.6  CL 97*  CO2 32  GLUCOSE 135*  BUN 14  CREATININE 0.60  CALCIUM 10.1   GFR: Estimated Creatinine Clearance: 79 mL/min (by C-G formula based on SCr of 0.6 mg/dL). Liver Function Tests:  Recent Labs Lab 06/12/17 2219  AST 19  ALT 21  ALKPHOS 90  BILITOT 0.4  PROT 8.7*  ALBUMIN 4.3    Recent Labs Lab 06/12/17 2219  LIPASE 58*   No results for input(s): AMMONIA in the last 168 hours. Coagulation Profile: No results for input(s): INR, PROTIME in the last 168 hours. Cardiac Enzymes: No results for input(s): CKTOTAL, CKMB, CKMBINDEX, TROPONINI in the last 168 hours. BNP (last 3 results) No results for input(s): PROBNP in the last 8760 hours. HbA1C: No results for input(s): HGBA1C in the last 72 hours. CBG: No results for input(s): GLUCAP in the last 168 hours. Lipid Profile: No results for input(s): CHOL, HDL, LDLCALC, TRIG, CHOLHDL, LDLDIRECT in the last 72 hours. Thyroid Function Tests: No results for input(s): TSH, T4TOTAL, FREET4, T3FREE, THYROIDAB in the last 72 hours. Anemia Panel: No results for input(s): VITAMINB12, FOLATE, FERRITIN, TIBC, IRON, RETICCTPCT in the last 72 hours. Urine  analysis:    Component Value Date/Time   COLORURINE YELLOW 06/12/2017 2218   APPEARANCEUR CLEAR 06/12/2017 2218   LABSPEC 1.020 06/12/2017 2218   PHURINE 8.0 06/12/2017 2218   GLUCOSEU NEGATIVE 06/12/2017 2218   GLUCOSEU NEGATIVE 01/12/2016 1716   HGBUR NEGATIVE 06/12/2017 2218   BILIRUBINUR NEGATIVE 06/12/2017 2218   KETONESUR NEGATIVE 06/12/2017 2218   PROTEINUR NEGATIVE 06/12/2017 2218   UROBILINOGEN 0.2 01/12/2016 1716   NITRITE NEGATIVE 06/12/2017 2218   LEUKOCYTESUR NEGATIVE 06/12/2017 2218   Sepsis Labs: @LABRCNTIP (procalcitonin:4,lacticidven:4) )No results found for this or any previous visit (from the past 240 hour(s)).   Radiological Exams on Admission: Ct Abdomen Pelvis W Contrast  Result Date: 06/13/2017 CLINICAL DATA:  Upper abdominal spasms with nausea and loose stools EXAM: CT ABDOMEN AND PELVIS WITH CONTRAST TECHNIQUE: Multidetector CT imaging of the abdomen and pelvis was performed using the standard protocol following bolus administration of intravenous contrast. CONTRAST:  ISOVUE-300 IOPAMIDOL (ISOVUE-300) INJECTION 61% COMPARISON:  07/17/2012 FINDINGS: Lower chest: Lung bases demonstrate no acute consolidation or pleural effusion. Borderline cardiomegaly. Hepatobiliary: No focal liver abnormality is seen. Status post cholecystectomy. No biliary dilatation. Pancreas: Unremarkable. No pancreatic ductal dilatation or surrounding inflammatory changes. Spleen: Normal in size without focal abnormality. Adrenals/Urinary Tract: Adrenal glands are within normal limits. Kidneys show no hydronephrosis. Nonspecific mild perinephric fat stranding. Bladder unremarkable. Stomach/Bowel: Stomach is nonenlarged. Fluid-filled borderline enlarged small bowel within the anterior lower abdomen and pelvis measuring up to 3.1 cm. Dilated loops of small bowel are closely apposed to the anterior abdominal wall. Decompressed distal small bowel loops. Normal appendix. No colon wall thickening.  Vascular/Lymphatic: No significant vascular findings are present. No enlarged abdominal or pelvic lymph nodes. Reproductive: Uterus and bilateral adnexa are unremarkable. Other: Midline scarring.  Negative for free air or free fluid Musculoskeletal: No acute or significant osseous findings. IMPRESSION: 1. Multiple fluid-filled borderline to slightly enlarged loops of small bowel within the anterior abdomen and pelvis, findings are suspicious for developing bowel obstruction. Given that the dilated small bowel loops are closely apposed to the anterior abdominal wall, suspect that the findings are related to adhesions. 2. There are  no other acute abnormalities visualized. Electronically Signed   By: Jasmine Pang M.D.   On: 06/13/2017 02:31     Assessment/Plan Principal Problem:   SBO (small bowel obstruction) (HCC) Active Problems:   Essential hypertension    1. Small bowel obstruction - appreciate general surgery consult. Patient is kept nothing by mouth on NG tube suction. Follow KUB. IV fluids and pain medications. Further recommendations per surgery. 2. Hypertension - since patient is nothing by mouth I have placed patient on when necessary IV hydralazine for now. 3. Recent use of prednisone for low back pain.  I have reviewed patient's old charts and labs.   DVT prophylaxis: SCDs in anticipation of procedure. Code Status: Full code.  Family Communication: Discussed with patient.  Disposition Plan: Home.  Consults called: General surgery.  Admission status: Inpatient.    Eduard Clos MD [facility] Pager [phone].  If 7PM-7AM, please contact night-coverage [email or web address] Password [email or web address]  06/13/2017, 3:11 AM

## 2017-06-14 ENCOUNTER — Inpatient Hospital Stay (HOSPITAL_COMMUNITY): Payer: 59

## 2017-06-14 DIAGNOSIS — Z0189 Encounter for other specified special examinations: Secondary | ICD-10-CM

## 2017-06-14 LAB — MAGNESIUM: Magnesium: 2 mg/dL (ref 1.7–2.4)

## 2017-06-14 LAB — GLUCOSE, CAPILLARY
GLUCOSE-CAPILLARY: 90 mg/dL (ref 65–99)
Glucose-Capillary: 122 mg/dL — ABNORMAL HIGH (ref 65–99)

## 2017-06-14 MED ORDER — KCL IN DEXTROSE-NACL 40-5-0.9 MEQ/L-%-% IV SOLN
INTRAVENOUS | Status: DC
Start: 2017-06-14 — End: 2017-06-16
  Administered 2017-06-14 – 2017-06-15 (×4): via INTRAVENOUS
  Filled 2017-06-14 (×5): qty 1000

## 2017-06-14 MED ORDER — MAGNESIUM SULFATE 2 GM/50ML IV SOLN
2.0000 g | Freq: Once | INTRAVENOUS | Status: AC
  Administered 2017-06-14: 2 g via INTRAVENOUS
  Filled 2017-06-14: qty 50

## 2017-06-14 MED ORDER — BISACODYL 10 MG RE SUPP
10.0000 mg | Freq: Once | RECTAL | Status: AC
  Administered 2017-06-14: 10 mg via RECTAL
  Filled 2017-06-14: qty 1

## 2017-06-14 MED ORDER — ENOXAPARIN SODIUM 40 MG/0.4ML ~~LOC~~ SOLN
40.0000 mg | SUBCUTANEOUS | Status: DC
  Administered 2017-06-14 – 2017-06-17 (×4): 40 mg via SUBCUTANEOUS
  Filled 2017-06-14 (×3): qty 0.4

## 2017-06-14 NOTE — Progress Notes (Signed)
Pt continuously complaining of throat pain. States the chlor-spray isnt effective. Paged on call and received order for cepacol lozenges.

## 2017-06-14 NOTE — Progress Notes (Signed)
[facility] PROGRESS NOTE  Bianca Campbell ZOX:[id number] DOB: [birth year] DOA: 06/12/2017   PCP: [name], MD     Assessment/Plan: Principal Problem:   SBO (small bowel obstruction) [facility]) Active Problems:   Essential hypertension   Encounter for imaging study to confirm nasogastric (NG) tube placement   Bianca Campbell is a 61 y.o. female with history of hypertension and recently was placed on prednisone for low back pain presents to the ER with complaints of abdominal pain and nausea. Patient's symptoms started last evening. Pain is diffuse spasm-like. Had nausea denies any vomiting.   ED Course: In the ER CT scan of the abdomen shows possible early signs of obstruction and on-call surgeon has been consulted. NG tube was placed and patient has been admitted for further management of possible developing small bowel obstruction. Patient's last bowel movement was last evening around 7:00 after the pain started.   Assessment and plan  1. Small bowel obstruction - appreciate general surgery consult. Patient is kept nothing by mouth on NG tube suction.   KUB results show slight improvement. IV fluids and pain medications. Conservative management per surgery.XR this AM showed contrast in the colon at the splenic flexure.  2. Hypertension - since patient is nothing by mouth I have placed patient on when necessary IV hydralazine for now. 3. Recent use of prednisone for low back pain. 4. Hypokalemia-replete   DVT prophylaxsis Lovenox  Code Status:  Full code    Family Communication: Discussed in detail with the patient, all imaging results, lab results explained to the patient   Disposition Plan:  2 to 3 days      Consultants:  General surgery  Procedures:  None  Antibiotics: Anti-infectives    None         HPI/Subjective: Had a small BM yesterday, KUB shows contrast in the colon at splenic flexure  Objective: Vitals:   06/13/17 2133  06/13/17 2225 06/14/17 0558 06/14/17 0800  BP: (!) 145/75 127/80 (!) 183/94 128/80  Pulse: 95 (!) 101 84 86  Resp: 18 18 18    Temp: 99 F (37.2 C) 99 F (37.2 C) 98.7 F (37.1 C)   TempSrc: Oral Oral Oral   SpO2: 98% 98% 96%   Weight:      Height:        Intake/Output Summary (Last 24 hours) at 06/14/17 0921 Last data filed at 06/14/17 8119  Gross per 24 hour  Intake             1655 ml  Output             1075 ml  Net              580 ml    Exam:  Examination:  General exam: Appears calm and comfortable  Respiratory system: Clear to auscultation. Respiratory effort normal. Cardiovascular system: S1 & S2 heard, RRR. No JVD, murmurs, rubs, gallops or clicks. No pedal edema. Gastrointestinal system: Abdomen is nondistended, soft and nontender. Decreased bowel sounds. Normal bowel sounds heard. Central nervous system: Alert and oriented. No focal neurological deficits. Extremities: Symmetric 5 x 5 power. Skin: No rashes, lesions or ulcers Psychiatry: Judgement and insight appear normal. Mood & affect appropriate.     Data Reviewed: I have personally reviewed following labs and imaging studies  Micro Results No results found for this or any previous visit (from the past 240 hour(s)).  Radiology Reports Ct Abdomen Pelvis W Contrast  Result Date: 06/13/2017 CLINICAL DATA:  Upper abdominal spasms with nausea and loose stools EXAM: CT ABDOMEN AND PELVIS WITH CONTRAST TECHNIQUE: Multidetector CT imaging of the abdomen and pelvis was performed using the standard protocol following bolus administration of intravenous contrast. CONTRAST:  ISOVUE-300 IOPAMIDOL (ISOVUE-300) INJECTION 61% COMPARISON:  07/17/2012 FINDINGS: Lower chest: Lung bases demonstrate no acute consolidation or pleural effusion. Borderline cardiomegaly. Hepatobiliary: No focal liver abnormality is seen. Status post cholecystectomy. No biliary dilatation. Pancreas: Unremarkable. No pancreatic ductal dilatation  or surrounding inflammatory changes. Spleen: Normal in size without focal abnormality. Adrenals/Urinary Tract: Adrenal glands are within normal limits. Kidneys show no hydronephrosis. Nonspecific mild perinephric fat stranding. Bladder unremarkable. Stomach/Bowel: Stomach is nonenlarged. Fluid-filled borderline enlarged small bowel within the anterior lower abdomen and pelvis measuring up to 3.1 cm. Dilated loops of small bowel are closely apposed to the anterior abdominal wall. Decompressed distal small bowel loops. Normal appendix. No colon wall thickening. Vascular/Lymphatic: No significant vascular findings are present. No enlarged abdominal or pelvic lymph nodes. Reproductive: Uterus and bilateral adnexa are unremarkable. Other: Midline scarring.  Negative for free air or free fluid Musculoskeletal: No acute or significant osseous findings. IMPRESSION: 1. Multiple fluid-filled borderline to slightly enlarged loops of small bowel within the anterior abdomen and pelvis, findings are suspicious for developing bowel obstruction. Given that the dilated small bowel loops are closely apposed to the anterior abdominal wall, suspect that the findings are related to adhesions. 2. There are no other acute abnormalities visualized. Electronically Signed   By: Jasmine Pang M.D.   On: 06/13/2017 02:31   Dg Abd Portable 1v-small Bowel Obstruction Protocol-initial, 8 Hr Delay  Result Date: 06/13/2017 CLINICAL DATA:  8 hour delay for small bowel obstruction protocol. EXAM: PORTABLE ABDOMEN - 1 VIEW COMPARISON:  06/05/2017 at 5:49 a.m. FINDINGS: Contrast lies within the proximal and mid stomach. The stomach is mildly distended. The nasogastric tube is stable, tip in the mid stomach. No contrast is seen within the bowel. There is a paucity of bowel gas, which limits assessment for the small-bowel obstruction suggested on the current CT. IMPRESSION: 1. Contrast lies within the stomach, but does not extend into the small bowel  or colon. This supports either a significant adynamic ileus or obstruction. Electronically Signed   By: Amie Portland M.D.   On: 06/13/2017 18:48   Dg Abd Portable 1v  Result Date: 06/13/2017 CLINICAL DATA:  NG tube placement EXAM: PORTABLE ABDOMEN - 1 VIEW COMPARISON:  None. FINDINGS: The tip and side port of the nasogastric tube are in the stomach. No acute abdominal findings. Excreted contrast material in the renal pelvises and urinary bladder%period% IMPRESSION: Tip and side port of NG tube are in the stomach. Electronically Signed   By: Deatra Robinson M.D.   On: 06/13/2017 06:30     CBC  Recent Labs Lab 06/12/17 2219 06/13/17 0605  WBC 14.5* 13.3*  HGB 13.6 12.7  HCT 40.8 38.7  PLT 415* 331  MCV 81.0 80.6  MCH 27.0 26.5  MCHC 33.3 32.8  RDW 13.9 13.7    Chemistries   Recent Labs Lab 06/12/17 2219 06/13/17 0605  NA 137 134*  K 3.6 3.3*  CL 97* 95*  CO2 32 28  GLUCOSE 135* 179*  BUN 14 11  CREATININE 0.60 0.58  CALCIUM 10.1 9.3  AST 19  --   ALT 21  --   ALKPHOS 90  --   BILITOT 0.4  --    ------------------------------------------------------------------------------------------------------------------ estimated creatinine clearance  is 79 mL/min (by C-G formula based on SCr of 0.58 mg/dL). ------------------------------------------------------------------------------------------------------------------ No results for input(s): HGBA1C in the last 72 hours. ------------------------------------------------------------------------------------------------------------------ No results for input(s): CHOL, HDL, LDLCALC, TRIG, CHOLHDL, LDLDIRECT in the last 72 hours. ------------------------------------------------------------------------------------------------------------------ No results for input(s): TSH, T4TOTAL, T3FREE, THYROIDAB in the last 72 hours.  Invalid input(s):  FREET3 ------------------------------------------------------------------------------------------------------------------ No results for input(s): VITAMINB12, FOLATE, FERRITIN, TIBC, IRON, RETICCTPCT in the last 72 hours.  Coagulation profile No results for input(s): INR, PROTIME in the last 168 hours.  No results for input(s): DDIMER in the last 72 hours.  Cardiac Enzymes No results for input(s): CKMB, TROPONINI, MYOGLOBIN in the last 168 hours.  Invalid input(s): CK ------------------------------------------------------------------------------------------------------------------ Invalid input(s): POCBNP   CBG:  Recent Labs Lab 06/13/17 1547 06/13/17 2342 06/14/17 0752  GLUCAP 92 157* 122*       Studies: Ct Abdomen Pelvis W Contrast  Result Date: 06/13/2017 CLINICAL DATA:  Upper abdominal spasms with nausea and loose stools EXAM: CT ABDOMEN AND PELVIS WITH CONTRAST TECHNIQUE: Multidetector CT imaging of the abdomen and pelvis was performed using the standard protocol following bolus administration of intravenous contrast. CONTRAST:  100mL ISOVUE-300 IOPAMIDOL (ISOVUE-300) INJECTION 61% COMPARISON:  07/17/2012 FINDINGS: Lower chest: Lung bases demonstrate no acute consolidation or pleural effusion. Borderline cardiomegaly. Hepatobiliary: No focal liver abnormality is seen. Status post cholecystectomy. No biliary dilatation. Pancreas: Unremarkable. No pancreatic ductal dilatation or surrounding inflammatory changes. Spleen: Normal in size without focal abnormality. Adrenals/Urinary Tract: Adrenal glands are within normal limits. Kidneys show no hydronephrosis. Nonspecific mild perinephric fat stranding. Bladder unremarkable. Stomach/Bowel: Stomach is nonenlarged. Fluid-filled borderline enlarged small bowel within the anterior lower abdomen and pelvis measuring up to 3.1 cm. Dilated loops of small bowel are closely apposed to the anterior abdominal wall. Decompressed distal small  bowel loops. Normal appendix. No colon wall thickening. Vascular/Lymphatic: No significant vascular findings are present. No enlarged abdominal or pelvic lymph nodes. Reproductive: Uterus and bilateral adnexa are unremarkable. Other: Midline scarring.  Negative for free air or free fluid Musculoskeletal: No acute or significant osseous findings. IMPRESSION: 1. Multiple fluid-filled borderline to slightly enlarged loops of small bowel within the anterior abdomen and pelvis, findings are suspicious for developing bowel obstruction. Given that the dilated small bowel loops are closely apposed to the anterior abdominal wall, suspect that the findings are related to adhesions. 2. There are no other acute abnormalities visualized. Electronically Signed   By: Jasmine PangKim  Fujinaga M.D.   On: 06/13/2017 02:31   Dg Abd Portable 1v-small Bowel Obstruction Protocol-initial, 8 Hr Delay  Result Date: 06/13/2017 CLINICAL DATA:  8 hour delay for small bowel obstruction protocol. EXAM: PORTABLE ABDOMEN - 1 VIEW COMPARISON:  06/05/2017 at 5:49 a.m. FINDINGS: Contrast lies within the proximal and mid stomach. The stomach is mildly distended. The nasogastric tube is stable, tip in the mid stomach. No contrast is seen within the bowel. There is a paucity of bowel gas, which limits assessment for the small-bowel obstruction suggested on the current CT. IMPRESSION: 1. Contrast lies within the stomach, but does not extend into the small bowel or colon. This supports either a significant adynamic ileus or obstruction. Electronically Signed   By: Amie Portlandavid  Ormond M.D.   On: 06/13/2017 18:48   Dg Abd Portable 1v  Result Date: 06/13/2017 CLINICAL DATA:  NG tube placement EXAM: PORTABLE ABDOMEN - 1 VIEW COMPARISON:  None. FINDINGS: The tip and side port of the nasogastric tube are in the stomach. No acute abdominal findings. Excreted contrast material in  the renal pelvises and urinary bladder%period% IMPRESSION: Tip and side port of NG tube are  in the stomach. Electronically Signed   By: Deatra Robinson M.D.   On: 06/13/2017 06:30      Lab Results  Component Value Date   HGBA1C 6.3 (H) 11/21/2016   HGBA1C 7.1 (H) 01/12/2016   HGBA1C 6.4 09/15/2015   Lab Results  Component Value Date   LDLCALC 99 01/12/2016   CREATININE 0.58 06/13/2017       Scheduled Meds: . pantoprazole (PROTONIX) IV  40 mg Intravenous Q24H   Continuous Infusions: . dextrose 5 % and 0.9% NaCl 75 mL/hr at 06/14/17 0600     LOS: 1 day    Time spent: >30 MINS    Richarda Overlie  [facility] Pager [phone]. If 7PM-7AM, please contact night-coverage at [email or web address], password [facility] 06/14/2017, 9:21 AM  LOS: 1 day

## 2017-06-14 NOTE — Progress Notes (Signed)
Patient ID: Bianca Campbell, female   DOB: September 21, 1956, 61 y.o.   MRN: [id number]  [facility] Surgery Progress Note     Subjective: CC- SBO Patient states that her pain is a little improved from yesterday, but she is still having some intermittent lower crampy abdominal pain. Denies n/v. No flatus or BM yet. NG with low output. XR this AM showed contrast in the colon at the splenic flexure.   Objective: Vital signs in last 24 hours: Temp:  [98.7 F (37.1 C)-99.1 F (37.3 C)] 98.7 F (37.1 C) (08/29 0558) Pulse Rate:  [80-101] 86 (08/29 0800) Resp:  [18] 18 (08/29 0558) BP: (127-183)/(75-101) 128/80 (08/29 0800) SpO2:  [96 %-99 %] 96 % (08/29 0558) Last BM Date: 06/12/17  Intake/Output from previous day: 08/28 0701 - 08/29 0700 In: 1655 [I.V.:1655] Out: 875 [Urine:725; Emesis/NG output:150] Intake/Output this shift: Total I/O In: 0  Out: 200 [Urine:200]  PE: Gen:  Alert, NAD, pleasant HEENT: EOM's intact, pupils equal and round Card:  RRR, no M/G/R heard Pulm:  CTAB, no W/R/R, effort normal Abd: obese, soft, mild distension, +BS, no HSM, no hernia, mild lower abdominal tenderness with no rebound or guarding Ext:  No erythema, edema, or tenderness BUE/BLE  Psych: A&Ox3  Skin: no rashes noted, warm and dry  Lab Results:   Recent Labs  06/12/17 2219 06/13/17 0605  WBC 14.5* 13.3*  HGB 13.6 12.7  HCT 40.8 38.7  PLT 415* 331   BMET  Recent Labs  06/12/17 2219 06/13/17 0605  NA 137 134*  K 3.6 3.3*  CL 97* 95*  CO2 32 28  GLUCOSE 135* 179*  BUN 14 11  CREATININE 0.60 0.58  CALCIUM 10.1 9.3   PT/INR No results for input(s): LABPROT, INR in the last 72 hours. CMP     Component Value Date/Time   NA 134 (L) 06/13/2017 0605   K 3.3 (L) 06/13/2017 0605   CL 95 (L) 06/13/2017 0605   CO2 28 06/13/2017 0605   GLUCOSE 179 (H) 06/13/2017 0605   BUN 11 06/13/2017 0605   CREATININE 0.58 06/13/2017 0605   CALCIUM 9.3 06/13/2017 0605   CALCIUM 9.7  06/24/2011 0952   PROT 8.7 (H) 06/12/2017 2219   ALBUMIN 4.3 06/12/2017 2219   AST 19 06/12/2017 2219   ALT 21 06/12/2017 2219   ALKPHOS 90 06/12/2017 2219   BILITOT 0.4 06/12/2017 2219   GFRNONAA >60 06/13/2017 0605   GFRAA >60 06/13/2017 0605   Lipase     Component Value Date/Time   LIPASE 58 (H) 06/12/2017 2219       Studies/Results: Ct Abdomen Pelvis W Contrast  Result Date: 06/13/2017 CLINICAL DATA:  Upper abdominal spasms with nausea and loose stools EXAM: CT ABDOMEN AND PELVIS WITH CONTRAST TECHNIQUE: Multidetector CT imaging of the abdomen and pelvis was performed using the standard protocol following bolus administration of intravenous contrast. CONTRAST:  ISOVUE-300 IOPAMIDOL (ISOVUE-300) INJECTION 61% COMPARISON:  07/17/2012 FINDINGS: Lower chest: Lung bases demonstrate no acute consolidation or pleural effusion. Borderline cardiomegaly. Hepatobiliary: No focal liver abnormality is seen. Status post cholecystectomy. No biliary dilatation. Pancreas: Unremarkable. No pancreatic ductal dilatation or surrounding inflammatory changes. Spleen: Normal in size without focal abnormality. Adrenals/Urinary Tract: Adrenal glands are within normal limits. Kidneys show no hydronephrosis. Nonspecific mild perinephric fat stranding. Bladder unremarkable. Stomach/Bowel: Stomach is nonenlarged. Fluid-filled borderline enlarged small bowel within the anterior lower abdomen and pelvis measuring up to 3.1 cm. Dilated loops of small bowel are closely apposed to  the anterior abdominal wall. Decompressed distal small bowel loops. Normal appendix. No colon wall thickening. Vascular/Lymphatic: No significant vascular findings are present. No enlarged abdominal or pelvic lymph nodes. Reproductive: Uterus and bilateral adnexa are unremarkable. Other: Midline scarring.  Negative for free air or free fluid Musculoskeletal: No acute or significant osseous findings. IMPRESSION: 1. Multiple fluid-filled  borderline to slightly enlarged loops of small bowel within the anterior abdomen and pelvis, findings are suspicious for developing bowel obstruction. Given that the dilated small bowel loops are closely apposed to the anterior abdominal wall, suspect that the findings are related to adhesions. 2. There are no other acute abnormalities visualized. Electronically Signed   By: Jasmine Pang M.D.   On: 06/13/2017 02:31   Dg Abd Portable 1v  Result Date: 06/14/2017 CLINICAL DATA:  Follow-up small bowel obstruction. 24 hour film following contrast administration. EXAM: PORTABLE ABDOMEN - 1 VIEW COMPARISON:  Abdominal radiograph of June 13, 2017 at 9:11 a.m. FINDINGS: Contrast has traversed the small bowel and inter the colon and proceed as far distally is a splenic flexure. No abnormal small or large bowel distention is observed. The esophagogastric tubes proximal port lies at the GE junction with the tip in the proximal body. Advancement by 5-10 cm would assure that the proximal port remains below the GE junction. IMPRESSION: There has been distal migration of the contrast to the level of the splenic flexure of the colon. No distended bowel loops are observed. Electronically Signed   By: David  [place] M.D.   On: 06/14/2017 09:23   Dg Abd Portable 1v-small Bowel Obstruction Protocol-initial, 8 Hr Delay  Result Date: 06/13/2017 CLINICAL DATA:  8 hour delay for small bowel obstruction protocol. EXAM: PORTABLE ABDOMEN - 1 VIEW COMPARISON:  06/05/2017 at 5:49 a.m. FINDINGS: Contrast lies within the proximal and mid stomach. The stomach is mildly distended. The nasogastric tube is stable, tip in the mid stomach. No contrast is seen within the bowel. There is a paucity of bowel gas, which limits assessment for the small-bowel obstruction suggested on the current CT. IMPRESSION: 1. Contrast lies within the stomach, but does not extend into the small bowel or colon. This supports either a significant adynamic ileus or  obstruction. Electronically Signed   By: Amie Portland M.D.   On: 06/13/2017 18:48   Dg Abd Portable 1v  Result Date: 06/13/2017 CLINICAL DATA:  NG tube placement EXAM: PORTABLE ABDOMEN - 1 VIEW COMPARISON:  None. FINDINGS: The tip and side port of the nasogastric tube are in the stomach. No acute abdominal findings. Excreted contrast material in the renal pelvises and urinary bladder%period% IMPRESSION: Tip and side port of NG tube are in the stomach. Electronically Signed   By: Deatra Robinson M.D.   On: 06/13/2017 06:30    Anti-infectives: Anti-infectives    None       Assessment/Plan HTN  SBO - previous h/o multiple abdominal surgeries - CT scan 8/28 showed multiple fluid-filled borderline to slightly enlarged loops of small bowel within the anterior abdomen and pelvis suspicious for developing bowel obstruction - NG with 150cc output over last 24 hours - XR this AM shows contrast at the splenic flexure of the colon, no distended bowel loops  ID - none FEN - IVF, NGT to LIWS VTE - SCDs, lovenox Foley - none Follow up - TBD  Plan - XR shows contrast travelling into the colon, but patient still without return in bowel function. Will continue NPO/NGT to [facility] for now. Continue ambulating.  LOS: 1 day    Franne Forts , [facility] 06/14/2017, 9:39 AM Pager: [phone] Consults: [phone] Mon-Fri 7:00 am-4:30 pm Sat-Sun 7:00 am-11:30 am

## 2017-06-15 LAB — GLUCOSE, CAPILLARY
GLUCOSE-CAPILLARY: 131 mg/dL — AB (ref 65–99)
GLUCOSE-CAPILLARY: 132 mg/dL — AB (ref 65–99)
GLUCOSE-CAPILLARY: 135 mg/dL — AB (ref 65–99)
Glucose-Capillary: 89 mg/dL (ref 65–99)

## 2017-06-15 MED ORDER — POLYETHYLENE GLYCOL 3350 17 G PO PACK
17.0000 g | PACK | Freq: Two times a day (BID) | ORAL | Status: DC
  Administered 2017-06-15 – 2017-06-17 (×5): 17 g via ORAL
  Filled 2017-06-15 (×5): qty 1

## 2017-06-15 MED ORDER — AMLODIPINE BESYLATE 5 MG PO TABS
5.0000 mg | ORAL_TABLET | Freq: Every day | ORAL | Status: DC
  Administered 2017-06-15 – 2017-06-17 (×3): 5 mg via ORAL
  Filled 2017-06-15 (×3): qty 1

## 2017-06-15 MED ORDER — PANTOPRAZOLE SODIUM 40 MG PO TBEC
40.0000 mg | DELAYED_RELEASE_TABLET | Freq: Every day | ORAL | Status: DC
  Administered 2017-06-15 – 2017-06-17 (×3): 40 mg via ORAL
  Filled 2017-06-15 (×3): qty 1

## 2017-06-15 NOTE — Progress Notes (Signed)
Patient ID: Bianca Campbell, female   DOB: 15-Dec-1955, 61 y.o.   MRN: [id number]  [facility] Surgery Progress Note     Subjective: CC- SBO Patient states that she had 2 BMs yesterday and 1 this morning. She is passing flatus. Abdominal pain improving. Denies n/v. Biggest complaint today is NG tube.  Objective: Vital signs in last 24 hours: Temp:  [98.4 F (36.9 C)-99.1 F (37.3 C)] 99.1 F (37.3 C) (08/30 0745) Pulse Rate:  [82-93] 82 (08/30 0745) Resp:  [16-18] 16 (08/30 0745) BP: (127-158)/(64-98) 127/64 (08/30 0745) SpO2:  [95 %-99 %] 99 % (08/30 0745) Last BM Date: 06/15/17 (patient reported this morning)  Intake/Output from previous day: 08/29 0701 - 08/30 0700 In: 2106.7 [I.V.:2106.7] Out: 500 [Urine:200; Emesis/NG output:300] Intake/Output this shift: No intake/output data recorded.  PE: Gen:  Alert, NAD, pleasant HEENT: EOM's intact, pupils equal and round Card:  RRR, no M/G/R heard Pulm:  CTAB, no W/R/R, effort normal Abd: obese, soft, mild distension, +BS, no HSM, no hernia, nontender Ext:  No erythema, edema, or tenderness BUE/BLE  Psych: A&Ox3  Skin: no rashes noted, warm and dry  Lab Results:   Recent Labs  06/12/17 2219 06/13/17 0605  WBC 14.5* 13.3*  HGB 13.6 12.7  HCT 40.8 38.7  PLT 415* 331   BMET  Recent Labs  06/12/17 2219 06/13/17 0605  NA 137 134*  K 3.6 3.3*  CL 97* 95*  CO2 32 28  GLUCOSE 135* 179*  BUN 14 11  CREATININE 0.60 0.58  CALCIUM 10.1 9.3   PT/INR No results for input(s): LABPROT, INR in the last 72 hours. CMP     Component Value Date/Time   NA 134 (L) 06/13/2017 0605   K 3.3 (L) 06/13/2017 0605   CL 95 (L) 06/13/2017 0605   CO2 28 06/13/2017 0605   GLUCOSE 179 (H) 06/13/2017 0605   BUN 11 06/13/2017 0605   CREATININE 0.58 06/13/2017 0605   CALCIUM 9.3 06/13/2017 0605   CALCIUM 9.7 06/24/2011 0952   PROT 8.7 (H) 06/12/2017 2219   ALBUMIN 4.3 06/12/2017 2219   AST 19 06/12/2017 2219   ALT 21  06/12/2017 2219   ALKPHOS 90 06/12/2017 2219   BILITOT 0.4 06/12/2017 2219   GFRNONAA >60 06/13/2017 0605   GFRAA >60 06/13/2017 0605   Lipase     Component Value Date/Time   LIPASE 58 (H) 06/12/2017 2219       Studies/Results: Dg Abd Portable 1v  Result Date: 06/14/2017 CLINICAL DATA:  Follow-up small bowel obstruction. 24 hour film following contrast administration. EXAM: PORTABLE ABDOMEN - 1 VIEW COMPARISON:  Abdominal radiograph of June 13, 2017 at 9:11 a.m. FINDINGS: Contrast has traversed the small bowel and inter the colon and proceed as far distally is a splenic flexure. No abnormal small or large bowel distention is observed. The esophagogastric tubes proximal port lies at the GE junction with the tip in the proximal body. Advancement by 5-10 cm would assure that the proximal port remains below the GE junction. IMPRESSION: There has been distal migration of the contrast to the level of the splenic flexure of the colon. No distended bowel loops are observed. Electronically Signed   By: David  [place] M.D.   On: 06/14/2017 09:23   Dg Abd Portable 1v-small Bowel Obstruction Protocol-initial, 8 Hr Delay  Result Date: 06/13/2017 CLINICAL DATA:  8 hour delay for small bowel obstruction protocol. EXAM: PORTABLE ABDOMEN - 1 VIEW COMPARISON:  06/05/2017 at 5:49 a.m. FINDINGS: Contrast  lies within the proximal and mid stomach. The stomach is mildly distended. The nasogastric tube is stable, tip in the mid stomach. No contrast is seen within the bowel. There is a paucity of bowel gas, which limits assessment for the small-bowel obstruction suggested on the current CT. IMPRESSION: 1. Contrast lies within the stomach, but does not extend into the small bowel or colon. This supports either a significant adynamic ileus or obstruction. Electronically Signed   By: Amie Portlandavid  Ormond M.D.   On: 06/13/2017 18:48    Anti-infectives: Anti-infectives    None       Assessment/Plan HTN  SBO -  previous h/o multiple abdominal surgeries - CT scan 8/28 showed multiple fluid-filled borderline to slightly enlarged loops of small bowel within the anterior abdomen and pelvis suspicious for developing bowel obstruction - NG with 300cc output over last 24 hours - BM x2 +flatus  ID - none FEN - IVF, clear liquids VTE - SCDs, lovenox Foley - none Follow up - TBD  Plan - D/c NG tube and advance to clear liquids. Continue ambulating.    LOS: 2 days    Franne FortsBROOKE A MEUTH , [facility] 06/15/2017, 9:59 AM Pager: [phone] Consults: [phone] Mon-Fri 7:00 am-4:30 pm Sat-Sun 7:00 am-11:30 am

## 2017-06-15 NOTE — Progress Notes (Signed)
[facility] PROGRESS NOTE  Bianca Campbell ZOX:[id number] DOB: 1956-09-16 DOA: 06/12/2017   PCP: [name], MD     Assessment/Plan: Principal Problem:   SBO (small bowel obstruction) [facility]) Active Problems:   Essential hypertension   Encounter for imaging study to confirm nasogastric (NG) tube placement   Bianca Campbell is a 61 y.o. female with history of hypertension and recently was placed on prednisone for low back pain presents to the ER with complaints of abdominal pain and nausea. Patient's symptoms started last evening. Pain is diffuse spasm-like. Had nausea denies any vomiting.    In the ER CT scan of the abdomen shows possible early signs of obstruction and on-call surgeon has been consulted. NG tube was placed and patient has been admitted for further management of possible developing small bowel obstruction. Patient's last bowel movement was last evening around 7:00 after the pain started.   Assessment and plan  1. Small bowel obstruction - appreciate general surgery consult. Seems to be improving. NG output decreasing. DC NG tube. Started on clear liquids by surgery.   Started on MiraLAX 2. Hypertension - continue  Prn IV hydralazine for now. Resume Norvasc 3. Recent use of prednisone for low back pain. 4. Hypokalemia-repleted   DVT prophylaxsis Lovenox general surgery surgery following  Code Status:  Full code    Family Communication: Discussed in detail with the patient, all imaging results, lab results explained to the patient   Disposition Plan:  2 to 3 days      Consultants:  General surgery  Procedures:  None  Antibiotics: Anti-infectives    None         HPI/Subjective: Patient had 2 BMs yesterday, denies any nausea, NGT out  Objective: Vitals:   06/14/17 2201 06/15/17 0529 06/15/17 0555 06/15/17 0745  BP: 127/78 (!) 158/98  127/64  Pulse: 93 91  82  Resp: 18 18  16   Temp:  98.6 F (37 C) 98.4 F (36.9 C)  99.1 F (37.3 C)  TempSrc: Oral Oral  Oral  SpO2: 95% 96%  99%  Weight:      Height:        Intake/Output Summary (Last 24 hours) at 06/15/17 1300 Last data filed at 06/15/17 1245  Gross per 24 hour  Intake          2126.67 ml  Output              600 ml  Net          1526.67 ml    Exam:  Examination:  General exam: Appears calm and comfortable  Respiratory system: Clear to auscultation. Respiratory effort normal. Cardiovascular system: S1 & S2 heard, RRR. No JVD, murmurs, rubs, gallops or clicks. No pedal edema. Gastrointestinal system: Abdomen is nondistended, soft and nontender. Decreased bowel sounds. Normal bowel sounds heard. Central nervous system: Alert and oriented. No focal neurological deficits. Extremities: Symmetric 5 x 5 power. Skin: No rashes, lesions or ulcers Psychiatry: Judgement and insight appear normal. Mood & affect appropriate.     Data Reviewed: I have personally reviewed following labs and imaging studies  Micro Results No results found for this or any previous visit (from the past 240 hour(s)).  Radiology Reports Ct Abdomen Pelvis W Contrast  Result Date: 06/13/2017 CLINICAL DATA:  Upper abdominal spasms with nausea and loose stools EXAM: CT ABDOMEN AND PELVIS WITH CONTRAST TECHNIQUE: Multidetector CT imaging of the abdomen and pelvis was performed using the  standard protocol following bolus administration of intravenous contrast. CONTRAST:  ISOVUE-300 IOPAMIDOL (ISOVUE-300) INJECTION 61% COMPARISON:  07/17/2012 FINDINGS: Lower chest: Lung bases demonstrate no acute consolidation or pleural effusion. Borderline cardiomegaly. Hepatobiliary: No focal liver abnormality is seen. Status post cholecystectomy. No biliary dilatation. Pancreas: Unremarkable. No pancreatic ductal dilatation or surrounding inflammatory changes. Spleen: Normal in size without focal abnormality. Adrenals/Urinary Tract: Adrenal glands are within normal limits. Kidneys show no  hydronephrosis. Nonspecific mild perinephric fat stranding. Bladder unremarkable. Stomach/Bowel: Stomach is nonenlarged. Fluid-filled borderline enlarged small bowel within the anterior lower abdomen and pelvis measuring up to 3.1 cm. Dilated loops of small bowel are closely apposed to the anterior abdominal wall. Decompressed distal small bowel loops. Normal appendix. No colon wall thickening. Vascular/Lymphatic: No significant vascular findings are present. No enlarged abdominal or pelvic lymph nodes. Reproductive: Uterus and bilateral adnexa are unremarkable. Other: Midline scarring.  Negative for free air or free fluid Musculoskeletal: No acute or significant osseous findings. IMPRESSION: 1. Multiple fluid-filled borderline to slightly enlarged loops of small bowel within the anterior abdomen and pelvis, findings are suspicious for developing bowel obstruction. Given that the dilated small bowel loops are closely apposed to the anterior abdominal wall, suspect that the findings are related to adhesions. 2. There are no other acute abnormalities visualized. Electronically Signed   By: Jasmine Pang M.D.   On: 06/13/2017 02:31   Dg Abd Portable 1v  Result Date: 06/14/2017 CLINICAL DATA:  Follow-up small bowel obstruction. 24 hour film following contrast administration. EXAM: PORTABLE ABDOMEN - 1 VIEW COMPARISON:  Abdominal radiograph of June 13, 2017 at 9:11 a.m. FINDINGS: Contrast has traversed the small bowel and inter the colon and proceed as far distally is a splenic flexure. No abnormal small or large bowel distention is observed. The esophagogastric tubes proximal port lies at the GE junction with the tip in the proximal body. Advancement by 5-10 cm would assure that the proximal port remains below the GE junction. IMPRESSION: There has been distal migration of the contrast to the level of the splenic flexure of the colon. No distended bowel loops are observed. Electronically Signed   By: David  [place]  M.D.   On: 06/14/2017 09:23   Dg Abd Portable 1v-small Bowel Obstruction Protocol-initial, 8 Hr Delay  Result Date: 06/13/2017 CLINICAL DATA:  8 hour delay for small bowel obstruction protocol. EXAM: PORTABLE ABDOMEN - 1 VIEW COMPARISON:  06/05/2017 at 5:49 a.m. FINDINGS: Contrast lies within the proximal and mid stomach. The stomach is mildly distended. The nasogastric tube is stable, tip in the mid stomach. No contrast is seen within the bowel. There is a paucity of bowel gas, which limits assessment for the small-bowel obstruction suggested on the current CT. IMPRESSION: 1. Contrast lies within the stomach, but does not extend into the small bowel or colon. This supports either a significant adynamic ileus or obstruction. Electronically Signed   By: Amie Portland M.D.   On: 06/13/2017 18:48   Dg Abd Portable 1v  Result Date: 06/13/2017 CLINICAL DATA:  NG tube placement EXAM: PORTABLE ABDOMEN - 1 VIEW COMPARISON:  None. FINDINGS: The tip and side port of the nasogastric tube are in the stomach. No acute abdominal findings. Excreted contrast material in the renal pelvises and urinary bladder%period% IMPRESSION: Tip and side port of NG tube are in the stomach. Electronically Signed   By: Deatra Robinson M.D.   On: 06/13/2017 06:30     CBC  Recent Labs Lab 06/12/17 2219 06/13/17 8657  WBC 14.5* 13.3*  HGB 13.6 12.7  HCT 40.8 38.7  PLT 415* 331  MCV 81.0 80.6  MCH 27.0 26.5  MCHC 33.3 32.8  RDW 13.9 13.7    Chemistries   Recent Labs Lab 06/12/17 2219 06/13/17 0605 06/14/17 1055  NA 137 134*  --   K 3.6 3.3*  --   CL 97* 95*  --   CO2 32 28  --   GLUCOSE 135* 179*  --   BUN 14 11  --   CREATININE 0.60 0.58  --   CALCIUM 10.1 9.3  --   MG  --   --  2.0  AST 19  --   --   ALT 21  --   --   ALKPHOS 90  --   --   BILITOT 0.4  --   --    ------------------------------------------------------------------------------------------------------------------ estimated creatinine  clearance is 79 mL/min (by C-G formula based on SCr of 0.58 mg/dL). ------------------------------------------------------------------------------------------------------------------ No results for input(s): HGBA1C in the last 72 hours. ------------------------------------------------------------------------------------------------------------------ No results for input(s): CHOL, HDL, LDLCALC, TRIG, CHOLHDL, LDLDIRECT in the last 72 hours. ------------------------------------------------------------------------------------------------------------------ No results for input(s): TSH, T4TOTAL, T3FREE, THYROIDAB in the last 72 hours.  Invalid input(s): FREET3 ------------------------------------------------------------------------------------------------------------------ No results for input(s): VITAMINB12, FOLATE, FERRITIN, TIBC, IRON, RETICCTPCT in the last 72 hours.  Coagulation profile No results for input(s): INR, PROTIME in the last 168 hours.  No results for input(s): DDIMER in the last 72 hours.  Cardiac Enzymes No results for input(s): CKMB, TROPONINI, MYOGLOBIN in the last 168 hours.  Invalid input(s): CK ------------------------------------------------------------------------------------------------------------------ Invalid input(s): POCBNP   CBG:  Recent Labs Lab 06/13/17 2342 06/14/17 0752 06/14/17 1612 06/15/17 0028 06/15/17 0744  GLUCAP 157* 122* 90 131* 132*       Studies: Dg Abd Portable 1v  Result Date: 06/14/2017 CLINICAL DATA:  Follow-up small bowel obstruction. 24 hour film following contrast administration. EXAM: PORTABLE ABDOMEN - 1 VIEW COMPARISON:  Abdominal radiograph of June 13, 2017 at 9:11 a.m. FINDINGS: Contrast has traversed the small bowel and inter the colon and proceed as far distally is a splenic flexure. No abnormal small or large bowel distention is observed. The esophagogastric tubes proximal port lies at the GE junction with the tip in  the proximal body. Advancement by 5-10 cm would assure that the proximal port remains below the GE junction. IMPRESSION: There has been distal migration of the contrast to the level of the splenic flexure of the colon. No distended bowel loops are observed. Electronically Signed   By: David  [place] M.D.   On: 06/14/2017 09:23   Dg Abd Portable 1v-small Bowel Obstruction Protocol-initial, 8 Hr Delay  Result Date: 06/13/2017 CLINICAL DATA:  8 hour delay for small bowel obstruction protocol. EXAM: PORTABLE ABDOMEN - 1 VIEW COMPARISON:  06/05/2017 at 5:49 a.m. FINDINGS: Contrast lies within the proximal and mid stomach. The stomach is mildly distended. The nasogastric tube is stable, tip in the mid stomach. No contrast is seen within the bowel. There is a paucity of bowel gas, which limits assessment for the small-bowel obstruction suggested on the current CT. IMPRESSION: 1. Contrast lies within the stomach, but does not extend into the small bowel or colon. This supports either a significant adynamic ileus or obstruction. Electronically Signed   By: Amie Portlandavid  Ormond M.D.   On: 06/13/2017 18:48      Lab Results  Component Value Date   HGBA1C 6.3 (H) 11/21/2016   HGBA1C 7.1 (H) 01/12/2016   HGBA1C  6.4 09/15/2015   Lab Results  Component Value Date   LDLCALC 99 01/12/2016   CREATININE 0.58 06/13/2017       Scheduled Meds: . enoxaparin (LOVENOX) injection  40 mg Subcutaneous Q24H  . pantoprazole (PROTONIX) IV  40 mg Intravenous Q24H   Continuous Infusions: . dextrose 5 % and 0.9 % NaCl with KCl 40 mEq/L 100 mL/hr at 06/15/17 0938     LOS: 2 days    Time spent: >30 MINS    Richarda Overlie  [facility] Pager [phone]. If 7PM-7AM, please contact night-coverage at [email or web address], password [facility] 06/15/2017, 1:00 PM  LOS: 2 days

## 2017-06-16 LAB — CBC
HEMATOCRIT: 37.8 % (ref 36.0–46.0)
Hemoglobin: 12.5 g/dL (ref 12.0–15.0)
MCH: 27.4 pg (ref 26.0–34.0)
MCHC: 33.1 g/dL (ref 30.0–36.0)
MCV: 82.7 fL (ref 78.0–100.0)
PLATELETS: 314 10*3/uL (ref 150–400)
RBC: 4.57 MIL/uL (ref 3.87–5.11)
RDW: 14.3 % (ref 11.5–15.5)
WBC: 7.4 10*3/uL (ref 4.0–10.5)

## 2017-06-16 LAB — COMPREHENSIVE METABOLIC PANEL
ALT: 25 U/L (ref 14–54)
ANION GAP: 9 (ref 5–15)
AST: 27 U/L (ref 15–41)
Albumin: 3.9 g/dL (ref 3.5–5.0)
Alkaline Phosphatase: 83 U/L (ref 38–126)
BUN: <5 mg/dL — ABNORMAL LOW (ref 6–20)
CHLORIDE: 105 mmol/L (ref 101–111)
CO2: 22 mmol/L (ref 22–32)
CREATININE: 0.56 mg/dL (ref 0.44–1.00)
Calcium: 9.1 mg/dL (ref 8.9–10.3)
Glucose, Bld: 124 mg/dL — ABNORMAL HIGH (ref 65–99)
POTASSIUM: 3.8 mmol/L (ref 3.5–5.1)
SODIUM: 136 mmol/L (ref 135–145)
Total Bilirubin: 1.2 mg/dL (ref 0.3–1.2)
Total Protein: 8.2 g/dL — ABNORMAL HIGH (ref 6.5–8.1)

## 2017-06-16 LAB — GLUCOSE, CAPILLARY
GLUCOSE-CAPILLARY: 132 mg/dL — AB (ref 65–99)
GLUCOSE-CAPILLARY: 120 mg/dL — AB (ref 65–99)
Glucose-Capillary: 78 mg/dL (ref 65–99)

## 2017-06-16 MED ORDER — METHOCARBAMOL 500 MG PO TABS: 500.0000 mg | ORAL_TABLET | Freq: Three times a day (TID) | ORAL | Status: DC | PRN

## 2017-06-16 NOTE — Progress Notes (Signed)
Patient ID: Bianca Campbell, female   DOB: 10-Jan-1956, 61 y.o.   MRN: [id number]  [facility] Surgery Progress Note     Subjective: CC- SBO Tolerating clear liquids. Denies n/v. Passing flatus. States that she had some mild crampy abdominal pain last night but she is feeling better this morning. Main complaint today is right shoulder/neck pain that started last night.   Objective: Vital signs in last 24 hours: Temp:  [98.6 F (37 C)-99.4 F (37.4 C)] 98.6 F (37 C) (08/31 0555) Pulse Rate:  [80-87] 83 (08/31 0555) Resp:  [15-16] 16 (08/31 0555) BP: (112-129)/(58-75) 112/58 (08/31 0555) SpO2:  [98 %-100 %] 98 % (08/31 0555) Last BM Date: 06/15/17  Intake/Output from previous day: 08/30 0701 - 08/31 0700 In: 3260 [P.O.:1260; I.V.:2000] Out: 2900 [Urine:2900] Intake/Output this shift: No intake/output data recorded.  PE: Gen: Alert, NAD, pleasant HEENT: EOM's intact, pupils equal and round Card: RRR, no M/G/R heard Pulm: CTAB, no W/R/R, effort normal Abd: obese, soft, mild distension, +BS in all 4 quadrants, no HSM, no hernia, nontender Ext: No erythema, edema, or tenderness BUE/BLE. TTP upper trap Psych: A&Ox3  Skin: no rashes noted, warm and dry  Lab Results:  No results for input(s): WBC, HGB, HCT, PLT in the last 72 hours. BMET No results for input(s): NA, K, CL, CO2, GLUCOSE, BUN, CREATININE, CALCIUM in the last 72 hours. PT/INR No results for input(s): LABPROT, INR in the last 72 hours. CMP     Component Value Date/Time   NA 134 (L) 06/13/2017 0605   K 3.3 (L) 06/13/2017 0605   CL 95 (L) 06/13/2017 0605   CO2 28 06/13/2017 0605   GLUCOSE 179 (H) 06/13/2017 0605   BUN 11 06/13/2017 0605   CREATININE 0.58 06/13/2017 0605   CALCIUM 9.3 06/13/2017 0605   CALCIUM 9.7 06/24/2011 0952   PROT 8.7 (H) 06/12/2017 2219   ALBUMIN 4.3 06/12/2017 2219   AST 19 06/12/2017 2219   ALT 21 06/12/2017 2219   ALKPHOS 90 06/12/2017 2219   BILITOT 0.4 06/12/2017 2219    GFRNONAA >60 06/13/2017 0605   GFRAA >60 06/13/2017 0605   Lipase     Component Value Date/Time   LIPASE 58 (H) 06/12/2017 2219       Studies/Results: Dg Abd Portable 1v  Result Date: 06/14/2017 CLINICAL DATA:  Follow-up small bowel obstruction. 24 hour film following contrast administration. EXAM: PORTABLE ABDOMEN - 1 VIEW COMPARISON:  Abdominal radiograph of June 13, 2017 at 9:11 a.m. FINDINGS: Contrast has traversed the small bowel and inter the colon and proceed as far distally is a splenic flexure. No abnormal small or large bowel distention is observed. The esophagogastric tubes proximal port lies at the GE junction with the tip in the proximal body. Advancement by 5-10 cm would assure that the proximal port remains below the GE junction. IMPRESSION: There has been distal migration of the contrast to the level of the splenic flexure of the colon. No distended bowel loops are observed. Electronically Signed   By: David  [place] M.D.   On: 06/14/2017 09:23    Anti-infectives: Anti-infectives    None       Assessment/Plan HTN  SBO - previous h/o multiple abdominal surgeries - CT scan 8/28 showed multiple fluid-filled borderline to slightly enlarged loops of small bowel within the anterior abdomen and pelvis suspicious for developing bowel obstruction - tolerating clears, passing flatus  ID - none FEN - decrease IVF, full liquids VTE - SCDs, lovenox Foley -  none Follow up- TBD  Plan - advance to full liquids. Continue ambulating. Add heat and robaxin for right shoulder/neck pain.     LOS: 3 days    Franne FortsBROOKE A Autumm Hattery , [facility] 06/16/2017, 7:36 AM Pager: [phone] Consults: [phone] Mon-Fri 7:00 am-4:30 pm Sat-Sun 7:00 am-11:30 am

## 2017-06-16 NOTE — Progress Notes (Signed)
[facility] PROGRESS NOTE  Bianca Campbell ZOX:[id number] DOB: Aug 11, 1956 DOA: 06/12/2017   PCP: [name], MD     Assessment/Plan: Principal Problem:   SBO (small bowel obstruction) [facility]) Active Problems:   Essential hypertension   Encounter for imaging study to confirm nasogastric (NG) tube placement   Bianca Campbell is a 60 y.o. female with history of hypertension and recently was placed on prednisone for low back pain presents to the ER with complaints of abdominal pain and nausea.   In the ER CT scan of the abdomen shows possible early signs of obstruction and on-call surgeon has been consulted. NG tube was placed and patient has been admitted for further management of possible developing small bowel obstruction. General surgery was consulted   Assessment and plan  1. Small bowel obstruction - appreciate general surgery consult. Seems to be improving.   DC'd NG tube. Started on clear liquids by surgery. Advance to full liquid diet today. Patient had 2 bowel movements today. Continue MiraLAX 2. Hypertension - continue  Prn IV hydralazine for now. Resume Norvasc 3. Chronic neck pain, back pain Recent use of prednisone for low back pain. Added Robaxin 4. Hypokalemia-repleted, recheck labs   DVT prophylaxsis Lovenox general surgery surgery following  Code Status:  Full code    Family Communication: Discussed in detail with the patient, all imaging results, lab results explained to the patient   Disposition Plan: Anticipate discharge tomorrow if stable      Consultants:  General surgery  Procedures:  None  Antibiotics: Anti-infectives    None         HPI/Subjective: liquid BM today . No Blood , no nausea , still not eating much  Objective: Vitals:   06/15/17 0745 06/15/17 1349 06/15/17 2157 06/16/17 0555  BP: 127/64 129/75 123/74 (!) 112/58  Pulse: 82 80 87 83  Resp: 16 15 16 16   Temp: 99.1 F (37.3 C) 98.7 F (37.1 C) 99.4 F  (37.4 C) 98.6 F (37 C)  TempSrc: Oral Oral Oral Oral  SpO2: 99% 100% 98% 98%  Weight:      Height:        Intake/Output Summary (Last 24 hours) at 06/16/17 0840 Last data filed at 06/16/17 0600  Gross per 24 hour  Intake             3260 ml  Output             2900 ml  Net              360 ml    Exam:  Examination:  General exam: Appears calm and comfortable  Respiratory system: Clear to auscultation. Respiratory effort normal. Cardiovascular system: S1 & S2 heard, RRR. No JVD, murmurs, rubs, gallops or clicks. No pedal edema. Gastrointestinal system: Abdomen is nondistended, soft and nontender. Decreased bowel sounds. Normal bowel sounds heard. Central nervous system: Alert and oriented. No focal neurological deficits. Extremities: Symmetric 5 x 5 power. Skin: No rashes, lesions or ulcers Psychiatry: Judgement and insight appear normal. Mood & affect appropriate.     Data Reviewed: I have personally reviewed following labs and imaging studies  Micro Results No results found for this or any previous visit (from the past 240 hour(s)).  Radiology Reports Ct Abdomen Pelvis W Contrast  Result Date: 06/13/2017 CLINICAL DATA:  Upper abdominal spasms with nausea and loose stools EXAM: CT ABDOMEN AND PELVIS WITH CONTRAST TECHNIQUE: Multidetector CT imaging of the abdomen  and pelvis was performed using the standard protocol following bolus administration of intravenous contrast. CONTRAST:  ISOVUE-300 IOPAMIDOL (ISOVUE-300) INJECTION 61% COMPARISON:  07/17/2012 FINDINGS: Lower chest: Lung bases demonstrate no acute consolidation or pleural effusion. Borderline cardiomegaly. Hepatobiliary: No focal liver abnormality is seen. Status post cholecystectomy. No biliary dilatation. Pancreas: Unremarkable. No pancreatic ductal dilatation or surrounding inflammatory changes. Spleen: Normal in size without focal abnormality. Adrenals/Urinary Tract: Adrenal glands are within normal limits.  Kidneys show no hydronephrosis. Nonspecific mild perinephric fat stranding. Bladder unremarkable. Stomach/Bowel: Stomach is nonenlarged. Fluid-filled borderline enlarged small bowel within the anterior lower abdomen and pelvis measuring up to 3.1 cm. Dilated loops of small bowel are closely apposed to the anterior abdominal wall. Decompressed distal small bowel loops. Normal appendix. No colon wall thickening. Vascular/Lymphatic: No significant vascular findings are present. No enlarged abdominal or pelvic lymph nodes. Reproductive: Uterus and bilateral adnexa are unremarkable. Other: Midline scarring.  Negative for free air or free fluid Musculoskeletal: No acute or significant osseous findings. IMPRESSION: 1. Multiple fluid-filled borderline to slightly enlarged loops of small bowel within the anterior abdomen and pelvis, findings are suspicious for developing bowel obstruction. Given that the dilated small bowel loops are closely apposed to the anterior abdominal wall, suspect that the findings are related to adhesions. 2. There are no other acute abnormalities visualized. Electronically Signed   By: Jasmine Pang M.D.   On: 06/13/2017 02:31   Dg Abd Portable 1v  Result Date: 06/14/2017 CLINICAL DATA:  Follow-up small bowel obstruction. 24 hour film following contrast administration. EXAM: PORTABLE ABDOMEN - 1 VIEW COMPARISON:  Abdominal radiograph of June 13, 2017 at 9:11 a.m. FINDINGS: Contrast has traversed the small bowel and inter the colon and proceed as far distally is a splenic flexure. No abnormal small or large bowel distention is observed. The esophagogastric tubes proximal port lies at the GE junction with the tip in the proximal body. Advancement by 5-10 cm would assure that the proximal port remains below the GE junction. IMPRESSION: There has been distal migration of the contrast to the level of the splenic flexure of the colon. No distended bowel loops are observed. Electronically Signed    By: David  [place] M.D.   On: 06/14/2017 09:23   Dg Abd Portable 1v-small Bowel Obstruction Protocol-initial, 8 Hr Delay  Result Date: 06/13/2017 CLINICAL DATA:  8 hour delay for small bowel obstruction protocol. EXAM: PORTABLE ABDOMEN - 1 VIEW COMPARISON:  06/05/2017 at 5:49 a.m. FINDINGS: Contrast lies within the proximal and mid stomach. The stomach is mildly distended. The nasogastric tube is stable, tip in the mid stomach. No contrast is seen within the bowel. There is a paucity of bowel gas, which limits assessment for the small-bowel obstruction suggested on the current CT. IMPRESSION: 1. Contrast lies within the stomach, but does not extend into the small bowel or colon. This supports either a significant adynamic ileus or obstruction. Electronically Signed   By: Amie Portland M.D.   On: 06/13/2017 18:48   Dg Abd Portable 1v  Result Date: 06/13/2017 CLINICAL DATA:  NG tube placement EXAM: PORTABLE ABDOMEN - 1 VIEW COMPARISON:  None. FINDINGS: The tip and side port of the nasogastric tube are in the stomach. No acute abdominal findings. Excreted contrast material in the renal pelvises and urinary bladder%period% IMPRESSION: Tip and side port of NG tube are in the stomach. Electronically Signed   By: Deatra Robinson M.D.   On: 06/13/2017 06:30     CBC  Recent Labs  Lab 06/12/17 2219 06/13/17 0605  WBC 14.5* 13.3*  HGB 13.6 12.7  HCT 40.8 38.7  PLT 415* 331  MCV 81.0 80.6  MCH 27.0 26.5  MCHC 33.3 32.8  RDW 13.9 13.7    Chemistries   Recent Labs Lab 06/12/17 2219 06/13/17 0605 06/14/17 1055  NA 137 134*  --   K 3.6 3.3*  --   CL 97* 95*  --   CO2 32 28  --   GLUCOSE 135* 179*  --   BUN 14 11  --   CREATININE 0.60 0.58  --   CALCIUM 10.1 9.3  --   MG  --   --  2.0  AST 19  --   --   ALT 21  --   --   ALKPHOS 90  --   --   BILITOT 0.4  --   --    ------------------------------------------------------------------------------------------------------------------ estimated  creatinine clearance is 79 mL/min (by C-G formula based on SCr of 0.58 mg/dL). ------------------------------------------------------------------------------------------------------------------ No results for input(s): HGBA1C in the last 72 hours. ------------------------------------------------------------------------------------------------------------------ No results for input(s): CHOL, HDL, LDLCALC, TRIG, CHOLHDL, LDLDIRECT in the last 72 hours. ------------------------------------------------------------------------------------------------------------------ No results for input(s): TSH, T4TOTAL, T3FREE, THYROIDAB in the last 72 hours.  Invalid input(s): FREET3 ------------------------------------------------------------------------------------------------------------------ No results for input(s): VITAMINB12, FOLATE, FERRITIN, TIBC, IRON, RETICCTPCT in the last 72 hours.  Coagulation profile No results for input(s): INR, PROTIME in the last 168 hours.  No results for input(s): DDIMER in the last 72 hours.  Cardiac Enzymes No results for input(s): CKMB, TROPONINI, MYOGLOBIN in the last 168 hours.  Invalid input(s): CK ------------------------------------------------------------------------------------------------------------------ Invalid input(s): POCBNP   CBG:  Recent Labs Lab 06/15/17 0028 06/15/17 0744 06/15/17 1617 06/15/17 2353 06/16/17 0815  GLUCAP 131* 132* 89 135* 132*       Studies: Dg Abd Portable 1v  Result Date: 06/14/2017 CLINICAL DATA:  Follow-up small bowel obstruction. 24 hour film following contrast administration. EXAM: PORTABLE ABDOMEN - 1 VIEW COMPARISON:  Abdominal radiograph of June 13, 2017 at 9:11 a.m. FINDINGS: Contrast has traversed the small bowel and inter the colon and proceed as far distally is a splenic flexure. No abnormal small or large bowel distention is observed. The esophagogastric tubes proximal port lies at the GE junction with  the tip in the proximal body. Advancement by 5-10 cm would assure that the proximal port remains below the GE junction. IMPRESSION: There has been distal migration of the contrast to the level of the splenic flexure of the colon. No distended bowel loops are observed. Electronically Signed   By: David  [place] M.D.   On: 06/14/2017 09:23      Lab Results  Component Value Date   HGBA1C 6.3 (H) 11/21/2016   HGBA1C 7.1 (H) 01/12/2016   HGBA1C 6.4 09/15/2015   Lab Results  Component Value Date   LDLCALC 99 01/12/2016   CREATININE 0.58 06/13/2017       Scheduled Meds: . amLODipine  5 mg Oral Daily  . enoxaparin (LOVENOX) injection  40 mg Subcutaneous Q24H  . pantoprazole  40 mg Oral Daily  . polyethylene glycol  17 g Oral BID   Continuous Infusions: . dextrose 5 % and 0.9 % NaCl with KCl 40 mEq/L 100 mL/hr at 06/15/17 1944     LOS: 3 days    Time spent: >30 MINS    Richarda OverlieNayana Littie Chiem  [facility] Pager [phone]. If 7PM-7AM, please contact night-coverage at [email or web address], password [email or web address] 06/16/2017, 8:40 AM  LOS: 3 days

## 2017-06-17 DIAGNOSIS — Z4659 Encounter for fitting and adjustment of other gastrointestinal appliance and device: Secondary | ICD-10-CM

## 2017-06-17 LAB — GLUCOSE, CAPILLARY: GLUCOSE-CAPILLARY: 127 mg/dL — AB (ref 65–99)

## 2017-06-17 NOTE — Progress Notes (Signed)
Nurse reviewed discharge instructions with pt.  Pt verbalized understanding of discharge instruction and follow up appointment.  No concerns at time of discharge.

## 2017-06-17 NOTE — Progress Notes (Signed)
   Subjective/Chief Complaint: No complaints. Tolerating fulls   Objective: Vital signs in last 24 hours: Temp:  [98 F (36.7 C)-98.7 F (37.1 C)] 98 F (36.7 C) (09/01 0700) Pulse Rate:  [72-91] 72 (09/01 0700) Resp:  [18] 18 (09/01 0700) BP: (134-151)/(80-93) 134/86 (09/01 0700) SpO2:  [100 %] 100 % (09/01 0700) Last BM Date: 06/16/17  Intake/Output from previous day: 08/31 0701 - 09/01 0700 In: 1440 [P.O.:1440] Out: 2600 [Urine:2600] Intake/Output this shift: No intake/output data recorded.  General appearance: alert and cooperative Resp: clear to auscultation bilaterally Cardio: regular rate and rhythm GI: soft, nontender. passing flatus  Lab Results:   Recent Labs  06/16/17 0921  WBC 7.4  HGB 12.5  HCT 37.8  PLT 314   BMET  Recent Labs  06/16/17 0921  NA 136  K 3.8  CL 105  CO2 22  GLUCOSE 124*  BUN <5*  CREATININE 0.56  CALCIUM 9.1   PT/INR No results for input(s): LABPROT, INR in the last 72 hours. ABG No results for input(s): PHART, HCO3 in the last 72 hours.  Invalid input(s): PCO2, PO2  Studies/Results: No results found.  Anti-infectives: Anti-infectives    None      Assessment/Plan: s/p * No surgery found * Advance diet. Start soft foods. If she can tolerate then she is ok for d/c from surgery standpoint. F/u with PCP  LOS: 4 days    TOTH III,PAUL S 06/17/2017

## 2017-06-17 NOTE — Discharge Summary (Signed)
Physician Discharge Summary  STERLING UCCI WJX:[id number] DOB: 08-Mar-1956 DOA: 06/12/2017  PCP: [name], MD  Admit date: 06/12/2017 Discharge date: 06/17/2017  Admitted From: Home Disposition:  Home  Recommendations for Outpatient Follow-up:  1. Follow up with PCP in 1-2 weeks   Discharge Condition:Stable CODE STATUS:Full Diet recommendation: Soft, advance as tolerated   Brief/Interim Summary: 61 y.o.femalewith history of hypertension and recently was placed on prednisone for low back pain presents to the ER with complaints of abdominal pain and nausea.   In the ER CT scan of the abdomen shows possible early signs of obstruction and on-call surgeon has been consulted. NG tube was placed and patient has been admitted for further management of possible developing small bowel obstruction. General surgery was consulted    1. Small bowel obstruction - appreciate general surgery consult. Much improved.   DC'd NG tube. Diet was successfully advanced 2. Hypertension - continued  Prn IV hydralazine while admitted. Resumed Norvasc 3. Chronic neck pain, back pain Recent use of prednisone for low back pain. Added Robaxin while inpatient 4. Hypokalemia-repleted  Discharge Diagnoses:  Principal Problem:   SBO (small bowel obstruction) (HCC) Active Problems:   Essential hypertension   Encounter for imaging study to confirm nasogastric (NG) tube placement    Discharge Instructions   Allergies as of 06/17/2017      Reactions   Latex Itching      Medication List    TAKE these medications   acetaminophen 650 MG CR tablet Commonly known as:  TYLENOL Take 1,300 mg by mouth every 8 (eight) hours as needed for pain.   amLODipine 10 MG tablet Commonly known as:  NORVASC TAKE ONE TABLET BY MOUTH ONCE DAILY   CALCIUM PO Take 1 tablet by mouth daily.   celecoxib 200 MG capsule Commonly known as:  CELEBREX 1 tab po q day with food for pain and  swelling What changed:  how  much to take  how to take this  when to take this  reasons to take this  additional instructions   cyclobenzaprine 5 MG tablet Commonly known as:  FLEXERIL Take 1 tablet (5 mg total) by mouth 3 (three) times daily as needed for muscle spasms.   dicyclomine 20 MG tablet Commonly known as:  BENTYL TAKE ONE TABLET BY MOUTH THREE TIMES DAILY BEFORE MEAL(S) What changed:  See the new instructions.   docusate sodium 100 MG capsule Commonly known as:  COLACE 1 tab 2 times a day while on narcotics.  STOOL SOFTENER   enoxaparin 30 MG/0.3ML injection Commonly known as:  LOVENOX Inject 0.3 mLs (30 mg total) into the skin every 12 (twelve) hours.   GLUCOSAMINE PO Take 1 tablet by mouth daily.   irbesartan 300 MG tablet Commonly known as:  AVAPRO TAKE ONE TABLET BY MOUTH ONCE DAILY   multivitamin tablet Take 1 tablet by mouth daily.   oxyCODONE 5 MG immediate release tablet Commonly known as:  Oxy IR/ROXICODONE 1-2 tablets every 4-6 hrs as needed for breakthrough pain   oxyCODONE 20 mg 12 hr tablet Commonly known as:  OXYCONTIN Take 1 tablet (20 mg total) by mouth every 12 (twelve) hours.   pantoprazole 40 MG tablet Commonly known as:  PROTONIX Take 1 tablet (40 mg total) by mouth daily.   polyethylene glycol packet Commonly known as:  MIRALAX / GLYCOLAX 17grams in 6 oz of water twice a day until bowel movement.  LAXITIVE.  Restart if two days since last bowel movement  predniSONE 10 MG tablet Commonly known as:  DELTASONE Take 10 mg by mouth 4 (four) times daily.   Vitamin D3 5000 units Caps Take 5,000 Units by mouth daily.      Follow-up Information    [name], MD. Schedule an appointment as soon as possible for a visit in 1 week(s).   Specialties:  Internal Medicine, Radiology Contact information: [address] [place] Schwalbe [facility] [place] [address] [phone]          Allergies  Allergen Reactions  . Latex Itching    Consultations:  General  Surgery  Procedures/Studies: Ct Abdomen Pelvis W Contrast  Result Date: 06/13/2017 CLINICAL DATA:  Upper abdominal spasms with nausea and loose stools EXAM: CT ABDOMEN AND PELVIS WITH CONTRAST TECHNIQUE: Multidetector CT imaging of the abdomen and pelvis was performed using the standard protocol following bolus administration of intravenous contrast. CONTRAST:  ISOVUE-300 IOPAMIDOL (ISOVUE-300) INJECTION 61% COMPARISON:  07/17/2012 FINDINGS: Lower chest: Lung bases demonstrate no acute consolidation or pleural effusion. Borderline cardiomegaly. Hepatobiliary: No focal liver abnormality is seen. Status post cholecystectomy. No biliary dilatation. Pancreas: Unremarkable. No pancreatic ductal dilatation or surrounding inflammatory changes. Spleen: Normal in size without focal abnormality. Adrenals/Urinary Tract: Adrenal glands are within normal limits. Kidneys show no hydronephrosis. Nonspecific mild perinephric fat stranding. Bladder unremarkable. Stomach/Bowel: Stomach is nonenlarged. Fluid-filled borderline enlarged small bowel within the anterior lower abdomen and pelvis measuring up to 3.1 cm. Dilated loops of small bowel are closely apposed to the anterior abdominal wall. Decompressed distal small bowel loops. Normal appendix. No colon wall thickening. Vascular/Lymphatic: No significant vascular findings are present. No enlarged abdominal or pelvic lymph nodes. Reproductive: Uterus and bilateral adnexa are unremarkable. Other: Midline scarring.  Negative for free air or free fluid Musculoskeletal: No acute or significant osseous findings. IMPRESSION: 1. Multiple fluid-filled borderline to slightly enlarged loops of small bowel within the anterior abdomen and pelvis, findings are suspicious for developing bowel obstruction. Given that the dilated small bowel loops are closely apposed to the anterior abdominal wall, suspect that the findings are related to adhesions. 2. There are no other acute  abnormalities visualized. Electronically Signed   By: Jasmine Pang M.D.   On: 06/13/2017 02:31   Dg Abd Portable 1v  Result Date: 06/14/2017 CLINICAL DATA:  Follow-up small bowel obstruction. 24 hour film following contrast administration. EXAM: PORTABLE ABDOMEN - 1 VIEW COMPARISON:  Abdominal radiograph of June 13, 2017 at 9:11 a.m. FINDINGS: Contrast has traversed the small bowel and inter the colon and proceed as far distally is a splenic flexure. No abnormal small or large bowel distention is observed. The esophagogastric tubes proximal port lies at the GE junction with the tip in the proximal body. Advancement by 5-10 cm would assure that the proximal port remains below the GE junction. IMPRESSION: There has been distal migration of the contrast to the level of the splenic flexure of the colon. No distended bowel loops are observed. Electronically Signed   By: David  [place] M.D.   On: 06/14/2017 09:23   Dg Abd Portable 1v-small Bowel Obstruction Protocol-initial, 8 Hr Delay  Result Date: 06/13/2017 CLINICAL DATA:  8 hour delay for small bowel obstruction protocol. EXAM: PORTABLE ABDOMEN - 1 VIEW COMPARISON:  06/05/2017 at 5:49 a.m. FINDINGS: Contrast lies within the proximal and mid stomach. The stomach is mildly distended. The nasogastric tube is stable, tip in the mid stomach. No contrast is seen within the bowel. There is a paucity of bowel gas, which limits  assessment for the small-bowel obstruction suggested on the current CT. IMPRESSION: 1. Contrast lies within the stomach, but does not extend into the small bowel or colon. This supports either a significant adynamic ileus or obstruction. Electronically Signed   By: Amie Portlandavid  Ormond M.D.   On: 06/13/2017 18:48   Dg Abd Portable 1v  Result Date: 06/13/2017 CLINICAL DATA:  NG tube placement EXAM: PORTABLE ABDOMEN - 1 VIEW COMPARISON:  None. FINDINGS: The tip and side port of the nasogastric tube are in the stomach. No acute abdominal findings.  Excreted contrast material in the renal pelvises and urinary bladder%period% IMPRESSION: Tip and side port of NG tube are in the stomach. Electronically Signed   By: Deatra RobinsonKevin  Herman M.D.   On: 06/13/2017 06:30    Subjective: Eager to go home  Discharge Exam: Vitals:   06/16/17 2118 06/17/17 0700  BP: (!) 151/93 134/86  Pulse: 90 72  Resp: 18 18  Temp: 98.6 F (37 C) 98 F (36.7 C)  SpO2: 100% 100%   Vitals:   06/16/17 0555 06/16/17 1400 06/16/17 2118 06/17/17 0700  BP: (!) 112/58 (!) 145/80 (!) 151/93 134/86  Pulse: 83 91 90 72  Resp: 16 18 18 18   Temp: 98.6 F (37 C) 98.7 F (37.1 C) 98.6 F (37 C) 98 F (36.7 C)  TempSrc: Oral Oral Oral Oral  SpO2: 98% 100% 100% 100%  Weight:      Height:        General: Pt is alert, awake, not in acute distress Cardiovascular: RRR, S1/S2 +, no rubs, no gallops Respiratory: CTA bilaterally, no wheezing, no rhonchi Abdominal: Soft, NT, ND, bowel sounds + Extremities: no edema, no cyanosis   The results of significant diagnostics from this hospitalization (including imaging, microbiology, ancillary and laboratory) are listed below for reference.     Microbiology: No results found for this or any previous visit (from the past 240 hour(s)).   Labs: BNP (last 3 results) No results for input(s): BNP in the last 8760 hours. Basic Metabolic Panel:  Recent Labs Lab 06/12/17 2219 06/13/17 0605 06/14/17 1055 06/16/17 0921  NA 137 134*  --  136  K 3.6 3.3*  --  3.8  CL 97* 95*  --  105  CO2 32 28  --  22  GLUCOSE 135* 179*  --  124*  BUN 14 11  --  <5*  CREATININE 0.60 0.58  --  0.56  CALCIUM 10.1 9.3  --  9.1  MG  --   --  2.0  --    Liver Function Tests:  Recent Labs Lab 06/12/17 2219 06/16/17 0921  AST 19 27  ALT 21 25  ALKPHOS 90 83  BILITOT 0.4 1.2  PROT 8.7* 8.2*  ALBUMIN 4.3 3.9    Recent Labs Lab 06/12/17 2219  LIPASE 58*   No results for input(s): AMMONIA in the last 168 hours. CBC:  Recent  Labs Lab 06/12/17 2219 06/13/17 0605 06/16/17 0921  WBC 14.5* 13.3* 7.4  HGB 13.6 12.7 12.5  HCT 40.8 38.7 37.8  MCV 81.0 80.6 82.7  PLT 415* 331 314   Cardiac Enzymes: No results for input(s): CKTOTAL, CKMB, CKMBINDEX, TROPONINI in the last 168 hours. BNP: Invalid input(s): POCBNP CBG:  Recent Labs Lab 06/15/17 2353 06/16/17 0815 06/16/17 1638 06/16/17 2122 06/17/17 0807  GLUCAP 135* 132* 78 120* 127*   D-Dimer No results for input(s): DDIMER in the last 72 hours. Hgb A1c No results for input(s): HGBA1C in the  last 72 hours. Lipid Profile No results for input(s): CHOL, HDL, LDLCALC, TRIG, CHOLHDL, LDLDIRECT in the last 72 hours. Thyroid function studies No results for input(s): TSH, T4TOTAL, T3FREE, THYROIDAB in the last 72 hours.  Invalid input(s): FREET3 Anemia work up No results for input(s): VITAMINB12, FOLATE, FERRITIN, TIBC, IRON, RETICCTPCT in the last 72 hours. Urinalysis    Component Value Date/Time   COLORURINE YELLOW 06/12/2017 2218   APPEARANCEUR CLEAR 06/12/2017 2218   LABSPEC 1.020 06/12/2017 2218   PHURINE 8.0 06/12/2017 2218   GLUCOSEU NEGATIVE 06/12/2017 2218   GLUCOSEU NEGATIVE 01/12/2016 1716   HGBUR NEGATIVE 06/12/2017 2218   BILIRUBINUR NEGATIVE 06/12/2017 2218   KETONESUR NEGATIVE 06/12/2017 2218   PROTEINUR NEGATIVE 06/12/2017 2218   UROBILINOGEN 0.2 01/12/2016 1716   NITRITE NEGATIVE 06/12/2017 2218   LEUKOCYTESUR NEGATIVE 06/12/2017 2218   Sepsis Labs Invalid input(s): PROCALCITONIN,  WBC,  LACTICIDVEN Microbiology No results found for this or any previous visit (from the past 240 hour(s)).   SIGNED:   Jerald Kief, MD  [facility] 06/17/2017, 12:11 PM  If 7PM-7AM, please contact night-coverage [email or web address] Password [email or web address]

## 2017-06-27 ENCOUNTER — Encounter: Payer: Self-pay | Admitting: Internal Medicine

## 2017-06-27 ENCOUNTER — Ambulatory Visit (INDEPENDENT_AMBULATORY_CARE_PROVIDER_SITE_OTHER): Payer: 59 | Admitting: Internal Medicine

## 2017-06-27 ENCOUNTER — Other Ambulatory Visit (INDEPENDENT_AMBULATORY_CARE_PROVIDER_SITE_OTHER): Payer: 59

## 2017-06-27 VITALS — BP 142/94 | HR 86 | Temp 98.3°F | Ht 59.5 in | Wt 221.0 lb

## 2017-06-27 DIAGNOSIS — E876 Hypokalemia: Secondary | ICD-10-CM | POA: Insufficient documentation

## 2017-06-27 DIAGNOSIS — R7302 Impaired glucose tolerance (oral): Secondary | ICD-10-CM

## 2017-06-27 DIAGNOSIS — Z0000 Encounter for general adult medical examination without abnormal findings: Secondary | ICD-10-CM

## 2017-06-27 DIAGNOSIS — K56609 Unspecified intestinal obstruction, unspecified as to partial versus complete obstruction: Secondary | ICD-10-CM

## 2017-06-27 DIAGNOSIS — I10 Essential (primary) hypertension: Secondary | ICD-10-CM

## 2017-06-27 DIAGNOSIS — R079 Chest pain, unspecified: Secondary | ICD-10-CM | POA: Diagnosis not present

## 2017-06-27 LAB — CBC WITH DIFFERENTIAL/PLATELET
BASOS PCT: 1 % (ref 0.0–3.0)
Basophils Absolute: 0 10*3/uL (ref 0.0–0.1)
Eosinophils Absolute: 0.2 10*3/uL (ref 0.0–0.7)
Eosinophils Relative: 3.8 % (ref 0.0–5.0)
HEMATOCRIT: 38 % (ref 36.0–46.0)
Hemoglobin: 12.1 g/dL (ref 12.0–15.0)
LYMPHS PCT: 41.7 % (ref 12.0–46.0)
Lymphs Abs: 2 10*3/uL (ref 0.7–4.0)
MCHC: 31.9 g/dL (ref 30.0–36.0)
MCV: 82.5 fl (ref 78.0–100.0)
MONOS PCT: 5.2 % (ref 3.0–12.0)
Monocytes Absolute: 0.3 10*3/uL (ref 0.1–1.0)
NEUTROS ABS: 2.4 10*3/uL (ref 1.4–7.7)
Neutrophils Relative %: 48.3 % (ref 43.0–77.0)
PLATELETS: 268 10*3/uL (ref 150.0–400.0)
RBC: 4.6 Mil/uL (ref 3.87–5.11)
RDW: 14 % (ref 11.5–15.5)
WBC: 4.9 10*3/uL (ref 4.0–10.5)

## 2017-06-27 LAB — BASIC METABOLIC PANEL
BUN: 9 mg/dL (ref 6–23)
CO2: 28 meq/L (ref 19–32)
Calcium: 10.1 mg/dL (ref 8.4–10.5)
Chloride: 103 mEq/L (ref 96–112)
Creatinine, Ser: 0.57 mg/dL (ref 0.40–1.20)
GFR: 139.03 mL/min (ref >60.00)
Glucose, Bld: 105 mg/dL — ABNORMAL HIGH (ref 70–99)
Potassium: 3.6 mEq/L (ref 3.5–5.1)
SODIUM: 140 meq/L (ref 135–145)

## 2017-06-27 LAB — URINALYSIS, ROUTINE W REFLEX MICROSCOPIC
Bilirubin Urine: NEGATIVE
HGB URINE DIPSTICK: NEGATIVE
Ketones, ur: NEGATIVE
Leukocytes, UA: NEGATIVE
Nitrite: NEGATIVE
RBC / HPF: NONE SEEN (ref 0–?)
SPECIFIC GRAVITY, URINE: 1.02 (ref 1.000–1.030)
Total Protein, Urine: NEGATIVE
URINE GLUCOSE: NEGATIVE
UROBILINOGEN UA: 0.2 (ref 0.0–1.0)
pH: 7 (ref 5.0–8.0)

## 2017-06-27 LAB — HEPATIC FUNCTION PANEL
ALBUMIN: 4.1 g/dL (ref 3.5–5.2)
ALK PHOS: 90 U/L (ref 39–117)
ALT: 13 U/L (ref 0–35)
AST: 14 U/L (ref 0–37)
BILIRUBIN DIRECT: 0.1 mg/dL (ref 0.0–0.3)
TOTAL PROTEIN: 7.8 g/dL (ref 6.0–8.3)
Total Bilirubin: 0.4 mg/dL (ref 0.2–1.2)

## 2017-06-27 LAB — LIPID PANEL
Cholesterol: 182 mg/dL (ref 0–200)
HDL: 65.8 mg/dL (ref >39.00)
LDL Cholesterol: 100 mg/dL — ABNORMAL HIGH (ref 0–99)
NONHDL: 116.66
Total CHOL/HDL Ratio: 3
Triglycerides: 81 mg/dL (ref 0.0–149.0)
VLDL: 16.2 mg/dL (ref 0.0–40.0)

## 2017-06-27 LAB — HEMOGLOBIN A1C: HEMOGLOBIN A1C: 6.5 % (ref 4.6–6.5)

## 2017-06-27 LAB — TSH: TSH: 1.08 u[IU]/mL (ref 0.35–4.50)

## 2017-06-27 MED ORDER — METHOCARBAMOL 500 MG PO TABS: 500.0000 mg | ORAL_TABLET | Freq: Four times a day (QID) | ORAL | 5 refills | Status: DC

## 2017-06-27 NOTE — Assessment & Plan Note (Addendum)
Atypical, etiology unclear, ecg reviewed, for cxr, also stress testing ordered  In addition to the time spent performing CPE, I spent an additional 25 minutes face to face,in which greater than 50% of this time was spent in counseling and coordination of care for patient's acute illness as documented. including the differential dx, tx, further evaluation and other management of atypical CP, HTN, hyperglycemia, hypoK, and SBO

## 2017-06-27 NOTE — Assessment & Plan Note (Signed)
stable overall by history and exam, recent data reviewed with pt, and pt to continue medical treatment as before,  to f/u any worsening symptoms or concerns BP Readings from Last 3 Encounters:  06/27/17 (!) 142/94  06/17/17 134/86  12/13/16 140/90

## 2017-06-27 NOTE — Progress Notes (Signed)
Subjective:    Patient ID: Bianca BoxerLinda P Aceituno, female    DOB: 25-Apr-1956, 61 y.o.   MRN: [id number]  HPI  Here for wellness and f/u recent hospn;  Overall doing ok;  Marland Kitchen.  Pt denies neurological change such as new headache, facial or extremity weakness.  Pt denies polydipsia, polyuria, or low sugar symptoms. Pt states overall good compliance with treatment and medications, good tolerability, and has been trying to follow appropriate diet.  Pt denies worsening depressive symptoms, suicidal ideation or panic. No fever, night sweats, wt loss, loss of appetite, or other constitutional symptoms.  Pt states good ability with ADL's, has low fall risk, home safety reviewed and adequate, no other significant changes in hearing or vision, and only occasionally active with exercise. Denies worsening reflux, abd pain, dysphagia, n/v, bowel change or blood, after recent hospn with SBO resolved with conservative management, and low K replaced.  Pt does today c/o intermittent anterior chest pain, dull pressure like without radiation, but with mild sob, but no wheezing, orthopnea, PND, increased LE swelling, palpitations, dizziness or syncope. BP Readings from Last 3 Encounters:  06/27/17 (!) 142/94  06/17/17 134/86  12/13/16 140/90   Past Medical History:  Diagnosis Date  . ALLERGIC RHINITIS 09/11/2007  . Anemia, unspecified 08/31/2012  . Arthritis    "knees" (11/23/2016)  . BACK PAIN   . EXTERNAL OTITIS 09/11/2007  . GERD 06/06/2007  . GOUT 11/14/2007  . High cholesterol   . HYPERTENSION 06/06/2007  . INTERMITTENT VERTIGO 09/11/2007  . Irritable bowel syndrome 06/06/2007  . LOW BACK PAIN 06/06/2007  . OSTEOARTHRITIS 06/06/2007  . PONV (postoperative nausea and vomiting)   . Primary localized osteoarthritis of right knee   . SHOULDER PAIN, RIGHT 12/30/2008   Past Surgical History:  Procedure Laterality Date  . BILATERAL SALPINGOOPHORECTOMY    . CESAREAN SECTION  1987, 1991  . COLONOSCOPY     pt stated 2015  or 2016 unknown md  . EXPLORATORY LAPAROTOMY     bowel obstruction  . INGUINAL HERNIA REPAIR Bilateral    numerous hernia repairs 1996 to 2011  . JOINT REPLACEMENT    . KNEE ARTHROSCOPY Bilateral 2013 - 11/11/2015  . LAPAROSCOPIC CHOLECYSTECTOMY  1991  . s/p incarcerated hernia and ovary cyst Jan 2011  2011  . TOTAL KNEE ARTHROPLASTY Right 11/21/2016   Procedure: RIGHT TOTAL KNEE ARTHROPLASTY;  Surgeon: Salvatore Marvelobert Wainer, MD;  [facility];  Service: Orthopedics;  Laterality: Right;  . UMBILICAL HERNIA REPAIR     numerous hernia repairs 1996 to 2011    reports that she has never smoked. She has never used smokeless tobacco. She reports that she does not drink alcohol or use drugs. family history includes Arthritis in her mother; Colon polyps (age of onset: 4568) in her father. Allergies  Allergen Reactions  . Latex Itching   Current Outpatient Prescriptions on File Prior to Visit  Medication Sig Dispense Refill  . acetaminophen (TYLENOL) 650 MG CR tablet Take 1,300 mg by mouth every 8 (eight) hours as needed for pain.    Marland Kitchen. amLODipine (NORVASC) 10 MG tablet TAKE ONE TABLET BY MOUTH ONCE DAILY 90 tablet 1  . CALCIUM PO Take 1 tablet by mouth daily.    . celecoxib (CELEBREX) 200 MG capsule 1 tab po q day with food for pain and  swelling (Patient taking differently: Take 200 mg by mouth daily as needed for moderate pain. ) 30 capsule 0  . Cholecalciferol (VITAMIN D3) 5000 units CAPS Take  5,000 Units by mouth daily.     Marland Kitchen dicyclomine (BENTYL) 20 MG tablet TAKE ONE TABLET BY MOUTH THREE TIMES DAILY BEFORE MEAL(S) (Patient taking differently: TAKE ONE TABLET BY MOUTH THREE TIMES AS NEEDED FOR STOMACH CRAMPING) 50 tablet 1  . docusate sodium (COLACE) 100 MG capsule 1 tab 2 times a day while on narcotics.  STOOL SOFTENER 60 capsule 0  . enoxaparin (LOVENOX) 30 MG/0.3ML injection Inject 0.3 mLs (30 mg total) into the skin every 12 (twelve) hours. 30 Syringe 0  . Glucosamine HCl (GLUCOSAMINE PO) Take 1  tablet by mouth daily.    . irbesartan (AVAPRO) 300 MG tablet TAKE ONE TABLET BY MOUTH ONCE DAILY 90 tablet 1  . Multiple Vitamin (MULTIVITAMIN) tablet Take 1 tablet by mouth daily.    Marland Kitchen oxyCODONE (OXY IR/ROXICODONE) 5 MG immediate release tablet 1-2 tablets every 4-6 hrs as needed for breakthrough pain 80 tablet 0  . oxyCODONE (OXYCONTIN) 20 mg 12 hr tablet Take 1 tablet (20 mg total) by mouth every 12 (twelve) hours. 14 tablet 0  . pantoprazole (PROTONIX) 40 MG tablet Take 1 tablet (40 mg total) by mouth daily. 90 tablet 3  . polyethylene glycol (MIRALAX / GLYCOLAX) packet 17grams in 6 oz of water twice a day until bowel movement.  LAXITIVE.  Restart if two days since last bowel movement 14 each 0  . predniSONE (DELTASONE) 10 MG tablet Take 10 mg by mouth 4 (four) times daily.      No current facility-administered medications on file prior to visit.    Review of Systems Constitutional: Negative for other unusual diaphoresis, sweats, appetite or weight changes HENT: Negative for other worsening hearing loss, ear pain, facial swelling, mouth sores or neck stiffness.   Eyes: Negative for other worsening pain, redness or other visual disturbance.  Respiratory: Negative for other stridor or swelling Cardiovascular: Negative for other palpitations or other chest pain  Gastrointestinal: Negative for worsening diarrhea or loose stools, blood in stool, distention or other pain Genitourinary: Negative for hematuria, flank pain or other change in urine volume.  Musculoskeletal: Negative for myalgias or other joint swelling.  Skin: Negative for other color change, or other wound or worsening drainage.  Neurological: Negative for other syncope or numbness. Hematological: Negative for other adenopathy or swelling Psychiatric/Behavioral: Negative for hallucinations, other worsening agitation, SI, self-injury, or new decreased concentration All other system neg per pt    Objective:   Physical Exam BP  (!) 142/94   Pulse 86   Temp 98.3 F (36.8 C) (Oral)   Ht 4' 11.5" (1.511 m)   Wt 221 lb (100.2 kg)   SpO2 100%   BMI 43.89 kg/m  VS noted,  Constitutional: Pt is oriented to person, place, and time. Appears well-developed and well-nourished, in no significant distress and comfortable Head: Normocephalic and atraumatic  Eyes: Conjunctivae and EOM are normal. Pupils are equal, round, and reactive to light Right Ear: External ear normal without discharge Left Ear: External ear normal without discharge Nose: Nose without discharge or deformity Mouth/Throat: Oropharynx is without other ulcerations and moist  Neck: Normal range of motion. Neck supple. No JVD present. No tracheal deviation present or significant neck LA or mass Cardiovascular: Normal rate, regular rhythm, normal heart sounds and intact distal pulses.   Pulmonary/Chest: WOB normal and breath sounds without rales or wheezing  Abdominal: Soft. Bowel sounds are normal. NT. No HSM  Musculoskeletal: Normal range of motion. Exhibits no edema Lymphadenopathy: Has no other cervical adenopathy.  Neurological: Pt is alert and oriented to person, place, and time. Pt has normal reflexes. No cranial nerve deficit. Motor grossly intact, Gait intact Skin: Skin is warm and dry. No rash noted or new ulcerations Psychiatric:  Has normal mood and affect. Behavior is normal without agitation No other exam findings  ECG today I have personally interpreted NSR with non specific T abnormality Lab Results  Component Value Date   WBC 4.9 06/27/2017   HGB 12.1 06/27/2017   HCT 38.0 06/27/2017   PLT 268.0 06/27/2017   GLUCOSE 105 (H) 06/27/2017   CHOL 182 06/27/2017   TRIG 81.0 06/27/2017   HDL 65.80 06/27/2017   LDLDIRECT 114.4 03/13/2013   LDLCALC 100 (H) 06/27/2017   ALT 13 06/27/2017   AST 14 06/27/2017   NA 140 06/27/2017   K 3.6 06/27/2017   CL 103 06/27/2017   CREATININE 0.57 06/27/2017   BUN 9 06/27/2017   CO2 28 06/27/2017    TSH 1.08 06/27/2017   INR 1.06 11/10/2016   HGBA1C 6.5 06/27/2017       Assessment & Plan:

## 2017-06-27 NOTE — Patient Instructions (Addendum)
Your EKG was OK today  Please continue all other medications as before, and refills have been done if requested.  Please have the pharmacy call with any other refills you may need.  Please continue your efforts at being more active, low cholesterol diet, and weight control.  You are otherwise up to date with prevention measures today.  Please keep your appointments with your specialists as you may have planned  You will be contacted regarding the referral for: Stress test  Please go to the LAB in the Basement (turn left off the elevator) for the tests to be done today  You will be contacted by phone if any changes need to be made immediately.  Otherwise, you will receive a letter about your results with an explanation, but please check with MyChart first.  Please remember to sign up for MyChart if you have not done so, as this will be important to you in the future with finding out test results, communicating by private email, and scheduling acute appointments online when needed.  Please return in 6 months, or sooner if needed

## 2017-06-27 NOTE — Assessment & Plan Note (Signed)
Clinically resolved,

## 2017-06-27 NOTE — Assessment & Plan Note (Signed)
stable overall by history and exam, recent data reviewed with pt, and pt to continue medical treatment as before,  to f/u any worsening symptoms or concerns Lab Results  Component Value Date   HGBA1C 6.5 06/27/2017

## 2017-06-27 NOTE — Assessment & Plan Note (Signed)
Lab Results  Component Value Date   K 3.6 06/27/2017  stable overall by history and exam, recent data reviewed with pt, and pt to continue medical treatment as before,  to f/u any worsening symptoms or concerns

## 2017-06-27 NOTE — Assessment & Plan Note (Signed)

## 2017-06-28 ENCOUNTER — Telehealth (HOSPITAL_COMMUNITY): Payer: Self-pay | Admitting: Internal Medicine

## 2017-06-28 NOTE — Addendum Note (Signed)
Addended by: Corwin LevinsJOHN, Derius Ghosh W on: 06/28/2017 06:35 PM   Modules accepted: Orders

## 2017-06-30 ENCOUNTER — Telehealth (HOSPITAL_COMMUNITY): Payer: Self-pay | Admitting: *Deleted

## 2017-06-30 NOTE — Telephone Encounter (Signed)
Left message on voicemail in reference to upcoming appointment scheduled for 07/03/17. Phone number given for a call back so details instructions can be given. Daneil Dolin

## 2017-06-30 NOTE — Telephone Encounter (Signed)
User: Trina Ao A Date/time: 06/28/17 2:41 PM  Comment: Called pt and lmsg for her to CB to get r/s for echo.   Context:  Outcome: Left Message  Phone number: [phone] Phone Type: Home Phone  Comm. type: Telephone Call type: Outgoing  Contact: Lurene Shadow P Relation to patient: Self

## 2017-07-03 ENCOUNTER — Ambulatory Visit (HOSPITAL_COMMUNITY): Payer: 59 | Attending: Cardiology

## 2017-07-03 DIAGNOSIS — R079 Chest pain, unspecified: Secondary | ICD-10-CM | POA: Insufficient documentation

## 2017-07-03 MED ORDER — TECHNETIUM TC 99M TETROFOSMIN IV KIT
32.9000 | PACK | Freq: Once | INTRAVENOUS | Status: AC | PRN
  Administered 2017-07-03: 32.9 via INTRAVENOUS
  Filled 2017-07-03: qty 33

## 2017-07-04 ENCOUNTER — Ambulatory Visit (HOSPITAL_COMMUNITY): Payer: 59 | Attending: Cardiology

## 2017-07-04 LAB — MYOCARDIAL PERFUSION IMAGING
CHL CUP NUCLEAR SDS: 2
CHL CUP RESTING HR STRESS: 85 {beats}/min
CHL RATE OF PERCEIVED EXERTION: 18
CSEPED: 4 min
CSEPPHR: 155 {beats}/min
Estimated workload: 6.4 METS
Exercise duration (sec): 30 s
LHR: 0.31
LV dias vol: 80 mL (ref 46–106)
LVSYSVOL: 29 mL
MPHR: 160 {beats}/min
Percent HR: 98 %
SRS: 1
SSS: 3
TID: 0.75

## 2017-07-04 MED ORDER — TECHNETIUM TC 99M TETROFOSMIN IV KIT
32.0000 | PACK | Freq: Once | INTRAVENOUS | Status: AC | PRN
  Administered 2017-07-04: 32 via INTRAVENOUS
  Filled 2017-07-04: qty 32

## 2017-07-07 ENCOUNTER — Encounter (HOSPITAL_COMMUNITY): Payer: 59

## 2017-09-18 ENCOUNTER — Encounter: Payer: Self-pay | Admitting: Internal Medicine

## 2017-09-18 ENCOUNTER — Ambulatory Visit: Payer: 59 | Admitting: Internal Medicine

## 2017-09-18 VITALS — BP 120/82 | HR 93 | Ht 59.5 in | Wt 229.0 lb

## 2017-09-18 DIAGNOSIS — I10 Essential (primary) hypertension: Secondary | ICD-10-CM

## 2017-09-18 DIAGNOSIS — M545 Low back pain, unspecified: Secondary | ICD-10-CM

## 2017-09-18 DIAGNOSIS — M7711 Lateral epicondylitis, right elbow: Secondary | ICD-10-CM | POA: Insufficient documentation

## 2017-09-18 MED ORDER — MELOXICAM 15 MG PO TABS: 15.0000 mg | ORAL_TABLET | Freq: Every day | ORAL | 1 refills | Status: DC | PRN

## 2017-09-18 MED ORDER — PREDNISONE 10 MG PO TABS: ORAL_TABLET | ORAL | 0 refills | Status: DC

## 2017-09-18 NOTE — Patient Instructions (Signed)
Please take all new medication as prescribed - the anti-inflammatory and prednsione  You can also take Tart Cherry and/or Tumeric otc for pain and inflammation as well  You will be contacted regarding the referral for: Sports Medicine  Please continue all other medications as before, and refills have been done if requested.  Please have the pharmacy call with any other refills you may need.  Please keep your appointments with your specialists as you may have planned

## 2017-09-18 NOTE — Progress Notes (Signed)
Subjective:    Patient ID: Bianca BoxerLinda P Campbell, female    DOB: Jan 05, 1956, 61 y.o.   MRN: [id number]  HPI  Here with recurring right lower back pain, Overall 1 yr onset, mild intermittent and without significant anyway radiating pain, but now 2 wks onset severe low midline back pain/sacral pain with radiation to the right buttock and ischial area, but no other radiation more distal.  Worse to bend, twist and stand up.  Better to sit , but worse to lie down, has to sleep on right side only and turning in bed can be excruciating.  OTC Equate not working,  And old rx of muscle relaxer not working as well. 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  Also s/p right knee TKR, has not worked for about 5 yrs since then.  Has f/u appt in Jan 2019 with Dr Alisa GraffWainer/ortho  Also today incidentally has right elbow pain and tenderness but no swelling, worse to use the right hand to work for several wks.  No dysuria or diarrhea.  Pt denies chest pain, increased sob or doe, wheezing, orthopnea, PND, increased LE swelling, palpitations, dizziness or syncope. Past Medical History:  Diagnosis Date  . ALLERGIC RHINITIS 09/11/2007  . Anemia, unspecified 08/31/2012  . Arthritis    "knees" (11/23/2016)  . BACK PAIN   . EXTERNAL OTITIS 09/11/2007  . GERD 06/06/2007  . GOUT 11/14/2007  . High cholesterol   . HYPERTENSION 06/06/2007  . INTERMITTENT VERTIGO 09/11/2007  . Irritable bowel syndrome 06/06/2007  . LOW BACK PAIN 06/06/2007  . OSTEOARTHRITIS 06/06/2007  . PONV (postoperative nausea and vomiting)   . Primary localized osteoarthritis of right knee   . SHOULDER PAIN, RIGHT 12/30/2008   Past Surgical History:  Procedure Laterality Date  . BILATERAL SALPINGOOPHORECTOMY    . CESAREAN SECTION  1987, 1991  . COLONOSCOPY     pt stated 2015 or 2016 unknown md  . EXPLORATORY LAPAROTOMY     bowel obstruction  . INGUINAL HERNIA REPAIR Bilateral    numerous hernia repairs 1996 to 2011  . JOINT REPLACEMENT    . KNEE  ARTHROSCOPY Bilateral 2013 - 11/11/2015  . LAPAROSCOPIC CHOLECYSTECTOMY  1991  . s/p incarcerated hernia and ovary cyst Jan 2011  2011  . TOTAL KNEE ARTHROPLASTY Right 11/21/2016   Procedure: RIGHT TOTAL KNEE ARTHROPLASTY;  Surgeon: Salvatore Marvelobert Wainer, MD;  [facility];  Service: Orthopedics;  Laterality: Right;  . UMBILICAL HERNIA REPAIR     numerous hernia repairs 1996 to 2011    reports that  has never smoked. she has never used smokeless tobacco. She reports that she does not drink alcohol or use drugs. family history includes Arthritis in her mother; Colon polyps (age of onset: 1568) in her father. Allergies  Allergen Reactions  . Latex Itching   Current Outpatient Medications on File Prior to Visit  Medication Sig Dispense Refill  . acetaminophen (TYLENOL) 650 MG CR tablet Take 1,300 mg by mouth every 8 (eight) hours as needed for pain.    Marland Kitchen. amLODipine (NORVASC) 10 MG tablet TAKE ONE TABLET BY MOUTH ONCE DAILY 90 tablet 1  . CALCIUM PO Take 1 tablet by mouth daily.    . celecoxib (CELEBREX) 200 MG capsule 1 tab po q day with food for pain and  swelling (Patient taking differently: Take 200 mg by mouth daily as needed for moderate pain. ) 30 capsule 0  . Cholecalciferol (VITAMIN D3) 5000 units CAPS Take 5,000 Units by mouth  daily.     . dicyclomine (BENTYL) 20 MG tablet TAKE ONE TABLET BY MOUTH THREE TIMES DAILY BEFORE MEAL(S) (Patient taking differently: TAKE ONE TABLET BY MOUTH THREE TIMES AS NEEDED FOR STOMACH CRAMPING) 50 tablet 1  . docusate sodium (COLACE) 100 MG capsule 1 tab 2 times a day while on narcotics.  STOOL SOFTENER 60 capsule 0  . enoxaparin (LOVENOX) 30 MG/0.3ML injection Inject 0.3 mLs (30 mg total) into the skin every 12 (twelve) hours. 30 Syringe 0  . Glucosamine HCl (GLUCOSAMINE PO) Take 1 tablet by mouth daily.    . irbesartan (AVAPRO) 300 MG tablet TAKE ONE TABLET BY MOUTH ONCE DAILY 90 tablet 1  . methocarbamol (ROBAXIN) 500 MG tablet Take 1 tablet (500 mg total) by  mouth 4 (four) times daily. 60 tablet 5  . Multiple Vitamin (MULTIVITAMIN) tablet Take 1 tablet by mouth daily.    Marland Kitchen. oxyCODONE (OXY IR/ROXICODONE) 5 MG immediate release tablet 1-2 tablets every 4-6 hrs as needed for breakthrough pain 80 tablet 0  . oxyCODONE (OXYCONTIN) 20 mg 12 hr tablet Take 1 tablet (20 mg total) by mouth every 12 (twelve) hours. 14 tablet 0  . pantoprazole (PROTONIX) 40 MG tablet Take 1 tablet (40 mg total) by mouth daily. 90 tablet 3  . polyethylene glycol (MIRALAX / GLYCOLAX) packet 17grams in 6 oz of water twice a day until bowel movement.  LAXITIVE.  Restart if two days since last bowel movement 14 each 0   No current facility-administered medications on file prior to visit.    Review of Systems  Constitutional: Negative for other unusual diaphoresis or sweats HENT: Negative for ear discharge or swelling Eyes: Negative for other worsening visual disturbances Respiratory: Negative for stridor or other swelling  Gastrointestinal: Negative for worsening distension or other blood Genitourinary: Negative for retention or other urinary change Musculoskeletal: Negative for other MSK pain or swelling Skin: Negative for color change or other new lesions Neurological: Negative for worsening tremors and other numbness  Psychiatric/Behavioral: Negative for worsening agitation or other fatigue All other system neg per pt    Objective:   Physical Exam BP 120/82   Pulse 93   Ht 4' 11.5" (1.511 m)   Wt 229 lb (103.9 kg)   SpO2 100%   BMI 45.48 kg/m  VS noted,  Constitutional: Pt appears in NAD HENT: Head: NCAT.  Right Ear: External ear normal.  Left Ear: External ear normal.  Eyes: . Pupils are equal, round, and reactive to light. Conjunctivae and EOM are normal Nose: without d/c or deformity Neck: Neck supple. Gross normal ROM Cardiovascular: Normal rate and regular rhythm.   Pulmonary/Chest: Effort normal and breath sounds without rales or wheezing.  Abd:  Soft,  NT, ND, + BS, no organomegaly Right lateral epicondylar area mild tender without rash or swelling Spine:  Has moderate tenderness to lowest lumbar midline as well as right paravertebral without rash or sweling Neurological: Pt is alert. At baseline orientation, motor grossly intact Skin: Skin is warm. No rashes, other new lesions, no LE edema Psychiatric: Pt behavior is normal without agitation  No other exam findings  LUMBAR SPINE - COMPLETE 4+ VIEW - 08/26/2016 IMPRESSION: - summary only Osteoarthritic change in the lower lumbar spine region, primarily at L4-5. No fracture or spondylolisthesis     Assessment & Plan:

## 2017-09-21 NOTE — Assessment & Plan Note (Signed)
stable overall by history and exam, recent data reviewed with pt, and pt to continue medical treatment as before,  to f/u any worsening symptoms or concerns BP Readings from Last 3 Encounters:  09/18/17 120/82  06/27/17 (!) 142/94  06/17/17 134/86

## 2017-09-21 NOTE — Assessment & Plan Note (Signed)
Pt states tramadol does not really work for her and does not want schedule II or higher meds; ok for nsaid prn, predpac asd, tart cherry and tumeric prn, consider f/u with Sport medicine in this office if not improved

## 2017-09-21 NOTE — Assessment & Plan Note (Signed)
Mild, for nsaid prn, rest and f/u sport medicine if not improved

## 2017-11-30 ENCOUNTER — Other Ambulatory Visit: Payer: Self-pay | Admitting: Orthopedic Surgery

## 2017-11-30 DIAGNOSIS — M1711 Unilateral primary osteoarthritis, right knee: Secondary | ICD-10-CM | POA: Diagnosis not present

## 2017-11-30 DIAGNOSIS — M545 Low back pain: Secondary | ICD-10-CM | POA: Diagnosis not present

## 2017-12-09 ENCOUNTER — Encounter: Payer: Self-pay | Admitting: Radiology

## 2017-12-09 ENCOUNTER — Ambulatory Visit
Admission: RE | Admit: 2017-12-09 | Discharge: 2017-12-09 | Disposition: A | Discharge status: Home / Self Care | Payer: 59 | Source: Ambulatory Visit | Attending: Orthopedic Surgery | Admitting: Orthopedic Surgery

## 2017-12-09 DIAGNOSIS — M48061 Spinal stenosis, lumbar region without neurogenic claudication: Secondary | ICD-10-CM | POA: Diagnosis not present

## 2017-12-09 DIAGNOSIS — M545 Low back pain: Secondary | ICD-10-CM

## 2017-12-26 DIAGNOSIS — M545 Low back pain: Secondary | ICD-10-CM | POA: Diagnosis not present

## 2017-12-26 DIAGNOSIS — M4316 Spondylolisthesis, lumbar region: Secondary | ICD-10-CM | POA: Diagnosis not present

## 2017-12-26 DIAGNOSIS — M47816 Spondylosis without myelopathy or radiculopathy, lumbar region: Secondary | ICD-10-CM | POA: Diagnosis not present

## 2018-02-13 DIAGNOSIS — M4316 Spondylolisthesis, lumbar region: Secondary | ICD-10-CM | POA: Diagnosis not present

## 2018-02-13 DIAGNOSIS — M545 Low back pain: Secondary | ICD-10-CM | POA: Diagnosis not present

## 2018-02-13 DIAGNOSIS — M47816 Spondylosis without myelopathy or radiculopathy, lumbar region: Secondary | ICD-10-CM | POA: Diagnosis not present

## 2018-05-10 DIAGNOSIS — Z01419 Encounter for gynecological examination (general) (routine) without abnormal findings: Secondary | ICD-10-CM | POA: Diagnosis not present

## 2018-06-19 ENCOUNTER — Ambulatory Visit: Payer: Self-pay | Admitting: Internal Medicine

## 2018-06-19 NOTE — Telephone Encounter (Signed)
Pt. Reports Sunday morning after washing her hair she developed dizziness and nausea. The nausea has stopped, but is still dizzy. States she has had vertigo in the past. Reports she has also had elevated BP readings. This morning it is  153/104. Pulse has been in the 80\'s. Appointment made for tomorrow morning. Reason for Disposition . [1] MODERATE dizziness (e.g., interferes with normal activities) AND [2] has NOT been evaluated by physician for this  (Exception: dizziness caused by heat exposure, sudden standing, or poor fluid intake)  Answer Assessment - Initial Assessment Questions 1. DESCRIPTION: "Describe your dizziness."     Lightheaed 2. LIGHTHEADED: "Do you feel lightheaded?" (e.g., somewhat faint, woozy, weak upon standing)     Worse when standing up 3. VERTIGO: "Do you feel like either you or the room is spinning or tilting?" (i.e. vertigo)     No 4. SEVERITY: "How bad is it?"  "Do you feel like you are going to faint?" "Can you stand and walk?"   - MILD - walking normally   - MODERATE - interferes with normal activities (e.g., work, school)    - SEVERE - unable to stand, requires support to walk, feels like passing out now.      Moderate 5. ONSET:  "When did the dizziness begin?"     Sunday 6. AGGRAVATING FACTORS: "Does anything make it worse?" (e.g., standing, change in head position)     Changing position 7. HEART RATE: "Can you tell me your heart rate?" "How many beats in 15 seconds?"  (Note: not all patients can do this)       80  8. CAUSE: "What do you think is causing the dizziness?"     Unsure 9. RECURRENT SYMPTOM: "Have you had dizziness before?" If so, ask: "When was the last time?" "What happened that time?"     Yes 10. OTHER SYMPTOMS: "Do you have any other symptoms?" (e.g., fever, chest pain, vomiting, diarrhea, bleeding)       Elevated BP  153/104 11. PREGNANCY: "Is there any chance you are pregnant?" "When was your last menstrual period?"       No  Protocols  used: DIZZINESS [facility]

## 2018-06-20 ENCOUNTER — Other Ambulatory Visit (INDEPENDENT_AMBULATORY_CARE_PROVIDER_SITE_OTHER): Payer: 59

## 2018-06-20 ENCOUNTER — Encounter: Payer: Self-pay | Admitting: Internal Medicine

## 2018-06-20 ENCOUNTER — Ambulatory Visit: Payer: 59 | Admitting: Internal Medicine

## 2018-06-20 VITALS — BP 164/102 | HR 84 | Temp 97.8°F | Ht 59.5 in | Wt 228.0 lb

## 2018-06-20 DIAGNOSIS — R002 Palpitations: Secondary | ICD-10-CM | POA: Diagnosis not present

## 2018-06-20 DIAGNOSIS — H8149 Vertigo of central origin, unspecified ear: Secondary | ICD-10-CM | POA: Diagnosis not present

## 2018-06-20 DIAGNOSIS — R739 Hyperglycemia, unspecified: Secondary | ICD-10-CM

## 2018-06-20 DIAGNOSIS — Z0001 Encounter for general adult medical examination with abnormal findings: Secondary | ICD-10-CM | POA: Diagnosis not present

## 2018-06-20 DIAGNOSIS — I10 Essential (primary) hypertension: Secondary | ICD-10-CM | POA: Diagnosis not present

## 2018-06-20 DIAGNOSIS — M25572 Pain in left ankle and joints of left foot: Secondary | ICD-10-CM | POA: Diagnosis not present

## 2018-06-20 DIAGNOSIS — Z0000 Encounter for general adult medical examination without abnormal findings: Secondary | ICD-10-CM

## 2018-06-20 DIAGNOSIS — H814 Vertigo of central origin: Secondary | ICD-10-CM | POA: Insufficient documentation

## 2018-06-20 HISTORY — DX: Vertigo of central origin: H81.4

## 2018-06-20 LAB — HEPATIC FUNCTION PANEL
ALK PHOS: 94 U/L (ref 39–117)
ALT: 12 U/L (ref 0–35)
AST: 14 U/L (ref 0–37)
Albumin: 4.3 g/dL (ref 3.5–5.2)
BILIRUBIN DIRECT: 0.2 mg/dL (ref 0.0–0.3)
BILIRUBIN TOTAL: 0.9 mg/dL (ref 0.2–1.2)
TOTAL PROTEIN: 8.4 g/dL — AB (ref 6.0–8.3)

## 2018-06-20 LAB — CBC WITH DIFFERENTIAL/PLATELET
BASOS ABS: 0 10*3/uL (ref 0.0–0.1)
Basophils Relative: 0.8 % (ref 0.0–3.0)
Eosinophils Absolute: 0.2 10*3/uL (ref 0.0–0.7)
Eosinophils Relative: 3.7 % (ref 0.0–5.0)
HEMATOCRIT: 40.8 % (ref 36.0–46.0)
HEMOGLOBIN: 13.4 g/dL (ref 12.0–15.0)
LYMPHS PCT: 35.8 % (ref 12.0–46.0)
Lymphs Abs: 1.9 10*3/uL (ref 0.7–4.0)
MCHC: 32.8 g/dL (ref 30.0–36.0)
MCV: 80.6 fl (ref 78.0–100.0)
MONOS PCT: 7.4 % (ref 3.0–12.0)
Monocytes Absolute: 0.4 10*3/uL (ref 0.1–1.0)
Neutro Abs: 2.7 10*3/uL (ref 1.4–7.7)
Neutrophils Relative %: 52.3 % (ref 43.0–77.0)
Platelets: 307 10*3/uL (ref 150.0–400.0)
RBC: 5.07 Mil/uL (ref 3.87–5.11)
RDW: 13.5 % (ref 11.5–15.5)
WBC: 5.2 10*3/uL (ref 4.0–10.5)

## 2018-06-20 LAB — URINALYSIS, ROUTINE W REFLEX MICROSCOPIC
Bilirubin Urine: NEGATIVE
Hgb urine dipstick: NEGATIVE
Ketones, ur: NEGATIVE
Leukocytes, UA: NEGATIVE
NITRITE: NEGATIVE
RBC / HPF: NONE SEEN (ref 0–?)
Specific Gravity, Urine: >=1.03 — AB (ref 1.000–1.030)
Total Protein, Urine: NEGATIVE
Urine Glucose: NEGATIVE
Urobilinogen, UA: 0.2 (ref 0.0–1.0)
WBC, UA: NONE SEEN (ref 0–?)
pH: 5.5 (ref 5.0–8.0)

## 2018-06-20 LAB — LIPID PANEL
CHOLESTEROL: 185 mg/dL (ref 0–200)
HDL: 71 mg/dL (ref >39.00)
LDL CALC: 101 mg/dL — AB (ref 0–99)
NONHDL: 114.42
Total CHOL/HDL Ratio: 3
Triglycerides: 66 mg/dL (ref 0.0–149.0)
VLDL: 13.2 mg/dL (ref 0.0–40.0)

## 2018-06-20 LAB — BASIC METABOLIC PANEL
BUN: 8 mg/dL (ref 6–23)
CO2: 30 mEq/L (ref 19–32)
CREATININE: 0.59 mg/dL (ref 0.40–1.20)
Calcium: 10.1 mg/dL (ref 8.4–10.5)
Chloride: 102 mEq/L (ref 96–112)
GFR: 133.17 mL/min (ref >60.00)
GLUCOSE: 119 mg/dL — AB (ref 70–99)
Potassium: 3.8 mEq/L (ref 3.5–5.1)
Sodium: 139 mEq/L (ref 135–145)

## 2018-06-20 LAB — HEMOGLOBIN A1C: HEMOGLOBIN A1C: 6.9 % — AB (ref 4.6–6.5)

## 2018-06-20 LAB — TSH: TSH: 2.22 u[IU]/mL (ref 0.35–4.50)

## 2018-06-20 MED ORDER — CETIRIZINE HCL 10 MG PO TABS: 10.0000 mg | ORAL_TABLET | Freq: Every day | ORAL | 11 refills | Status: DC

## 2018-06-20 MED ORDER — HYDROCHLOROTHIAZIDE 25 MG PO TABS: 25.0000 mg | ORAL_TABLET | Freq: Every day | ORAL | 3 refills | Status: DC

## 2018-06-20 MED ORDER — TRIAMCINOLONE ACETONIDE 55 MCG/ACT NA AERO: 2.0000 | INHALATION_SPRAY | Freq: Every day | NASAL | 12 refills | Status: DC

## 2018-06-20 MED ORDER — MECLIZINE HCL 12.5 MG PO TABS: 12.5000 mg | ORAL_TABLET | Freq: Three times a day (TID) | ORAL | 1 refills | Status: DC | PRN

## 2018-06-20 MED ORDER — ONDANSETRON HCL 4 MG PO TABS: 4.0000 mg | ORAL_TABLET | Freq: Three times a day (TID) | ORAL | 0 refills | Status: DC | PRN

## 2018-06-20 NOTE — Assessment & Plan Note (Signed)
stable overall by history and exam, recent data reviewed with pt, and pt to continue medical treatment as before,  to f/u any worsening symptoms or concerns  

## 2018-06-20 NOTE — Assessment & Plan Note (Signed)
?   Etiology, tsh ok, for cardiac event monitor

## 2018-06-20 NOTE — Assessment & Plan Note (Addendum)
For meclizine prn,.  For MRI head, to f/u any worsening symptoms or concerns  In addition to the time spent performing CPE, I spent an additional 25 minutes face to face,in which greater than 50% of this time was spent in counseling and coordination of care for patient's acute illness as documented, including the differential dx, treatment, further evaluation and other management of vertigo, left ankle pain, hyperglycemia, and palpitations, HTN

## 2018-06-20 NOTE — Assessment & Plan Note (Signed)
Mild to mod, for f/u sports med if this persists, to f/u any worsening symptoms or concerns

## 2018-06-20 NOTE — Patient Instructions (Addendum)
Please take all new medication as prescribed - the hct 25 mg per day for blood pressure  Please take all new medication as prescribed  - the zyrtec and nasacort for allergies, and meclizine for the vertigo  You can also take Mucinex (or it's generic off brand) for congestion, and tylenol as needed for pain.  You will be contacted regarding the referral for: Head MRI, and sports medicine in this office, as well as the heart monitor  Please continue all other medications as before, and refills have been done if requested.  Please have the pharmacy call with any other refills you may need.  Please continue your efforts at being more active, low cholesterol diet, and weight control.  You are otherwise up to date with prevention measures today.  Please keep your appointments with your specialists as you may have planned  Please go to the LAB in the Basement (turn left off the elevator) for the tests to be done today  You will be contacted by phone if any changes need to be made immediately.  Otherwise, you will receive a letter about your results with an explanation, but please check with MyChart first.  Please remember to sign up for MyChart if you have not done so, as this will be important to you in the future with finding out test results, communicating by private email, and scheduling acute appointments online when needed.  Please return in 6 months, or sooner if needed

## 2018-06-20 NOTE — Progress Notes (Signed)
Subjective:    Patient ID: Bianca Campbell, female    DOB: 1956/04/05, 62 y.o.   MRN: [id number]  HPI  Here for wellness and f/u;  Overall doing ok;  Pt denies Chest pain, worsening SOB, DOE, wheezing, orthopnea, PND, worsening LE edema, dizziness or syncope.  Pt denies neurological change such as new headache, facial or extremity weakness.  Pt denies polydipsia, polyuria, or low sugar symptoms. Pt states overall good compliance with treatment and medications, good tolerability, and has been trying to follow appropriate diet.  Pt denies worsening depressive symptoms, suicidal ideation or panic. No fever, night sweats, wt loss, loss of appetite, or other constitutional symptoms.  Pt states good ability with ADL's, has low fall risk, home safety reviewed and adequate, no other significant changes in hearing or vision, and occasionally active with exercise.  Going to the gym 3 times per wk. Does recumbent bike due to hx or right knee replacement. Also BP at home has been 140-160's and DBP 85-95 in the past wk. Has had some vertigo with head position change for several days.  Also has left medial ankle pain worse to walk, sharp, mild to mod, nothing else makes better or worse.  Also with recurrent episodes heart racing random about 1-2 times per wk, not assoc with other symptoms Wt Readings from Last 3 Encounters:  06/20/18 228 lb (103.4 kg)  09/18/17 229 lb (103.9 kg)  07/03/17 221 lb (100.2 kg)   Past Medical History:  Diagnosis Date  . ALLERGIC RHINITIS 09/11/2007  . Anemia, unspecified 08/31/2012  . Arthritis    "knees" (11/23/2016)  . BACK PAIN   . EXTERNAL OTITIS 09/11/2007  . GERD 06/06/2007  . GOUT 11/14/2007  . High cholesterol   . HYPERTENSION 06/06/2007  . INTERMITTENT VERTIGO 09/11/2007  . Irritable bowel syndrome 06/06/2007  . LOW BACK PAIN 06/06/2007  . OSTEOARTHRITIS 06/06/2007  . PONV (postoperative nausea and vomiting)   . Primary localized osteoarthritis of right knee   .  SHOULDER PAIN, RIGHT 12/30/2008   Past Surgical History:  Procedure Laterality Date  . BILATERAL SALPINGOOPHORECTOMY    . CESAREAN SECTION  1987, 1991  . COLONOSCOPY     pt stated 2015 or 2016 unknown md  . EXPLORATORY LAPAROTOMY     bowel obstruction  . INGUINAL HERNIA REPAIR Bilateral    numerous hernia repairs 1996 to 2011  . JOINT REPLACEMENT    . KNEE ARTHROSCOPY Bilateral 2013 - 11/11/2015  . LAPAROSCOPIC CHOLECYSTECTOMY  1991  . s/p incarcerated hernia and ovary cyst Jan 2011  2011  . TOTAL KNEE ARTHROPLASTY Right 11/21/2016   Procedure: RIGHT TOTAL KNEE ARTHROPLASTY;  Surgeon: Salvatore Marvel, MD;  Location: [facility] OR;  Service: Orthopedics;  Laterality: Right;  . UMBILICAL HERNIA REPAIR     numerous hernia repairs 1996 to 2011    reports that she has never smoked. She has never used smokeless tobacco. She reports that she does not drink alcohol or use drugs. family history includes Arthritis in her mother; Colon polyps (age of onset: 18) in her father. Allergies  Allergen Reactions  . Latex Itching   Current Outpatient Medications on File Prior to Visit  Medication Sig Dispense Refill  . acetaminophen (TYLENOL) 650 MG CR tablet Take 1,300 mg by mouth every 8 (eight) hours as needed for pain.    Marland Kitchen amLODipine (NORVASC) 10 MG tablet TAKE ONE TABLET BY MOUTH ONCE DAILY 90 tablet 1  . CALCIUM PO Take 1 tablet by mouth daily.    Marland Kitchen  Cholecalciferol (VITAMIN D3) 5000 units CAPS Take 5,000 Units by mouth daily.     Marland Kitchen dicyclomine (BENTYL) 20 MG tablet TAKE ONE TABLET BY MOUTH THREE TIMES DAILY BEFORE MEAL(S) (Patient taking differently: TAKE ONE TABLET BY MOUTH THREE TIMES AS NEEDED FOR STOMACH CRAMPING) 50 tablet 1  . docusate sodium (COLACE) 100 MG capsule 1 tab 2 times a day while on narcotics.  STOOL SOFTENER 60 capsule 0  . Glucosamine HCl (GLUCOSAMINE PO) Take 1 tablet by mouth daily.    . irbesartan (AVAPRO) 300 MG tablet TAKE ONE TABLET BY MOUTH ONCE DAILY 90 tablet 1  . meloxicam  (MOBIC) 15 MG tablet Take 1 tablet (15 mg total) by mouth daily as needed for pain. 90 tablet 1  . Multiple Vitamin (MULTIVITAMIN) tablet Take 1 tablet by mouth daily.    . polyethylene glycol (MIRALAX / GLYCOLAX) packet 17grams in 6 oz of water twice a day until bowel movement.  LAXITIVE.  Restart if two days since last bowel movement 14 each 0   No current facility-administered medications on file prior to visit.    Review of Systems Constitutional: Negative for other unusual diaphoresis, sweats, appetite or weight changes HENT: Negative for other worsening hearing loss, ear pain, facial swelling, mouth sores or neck stiffness.   Eyes: Negative for other worsening pain, redness or other visual disturbance.  Respiratory: Negative for other stridor or swelling Cardiovascular: Negative for other palpitations or other chest pain  Gastrointestinal: Negative for worsening diarrhea or loose stools, blood in stool, distention or other pain Genitourinary: Negative for hematuria, flank pain or other change in urine volume.  Musculoskeletal: Negative for myalgias or other joint swelling.  Skin: Negative for other color change, or other wound or worsening drainage.  Neurological: Negative for other syncope or numbness. Hematological: Negative for other adenopathy or swelling Psychiatric/Behavioral: Negative for hallucinations, other worsening agitation, SI, self-injury, or new decreased concentration All other system neg per pt    Objective:   Physical Exam BP (!) 164/102   Pulse 84   Temp 97.8 F (36.6 C) (Oral)   Ht 4' 11.5" (1.511 m)   Wt 228 lb (103.4 kg)   SpO2 96%   BMI 45.28 kg/m  VS noted,  Constitutional: Pt is oriented to person, place, and time. Appears well-developed and well-nourished, in no significant distress and comfortable Head: Normocephalic and atraumatic  Eyes: Conjunctivae and EOM are normal. Pupils are equal, round, and reactive to light Right Ear: External ear normal  without discharge Left Ear: External ear normal without discharge Nose: Nose without discharge or deformity Mouth/Throat: Oropharynx is without other ulcerations and moist  Neck: Normal range of motion. Neck supple. No JVD present. No tracheal deviation present or significant neck LA or mass Cardiovascular: Normal rate, regular rhythm, normal heart sounds and intact distal pulses.   Pulmonary/Chest: WOB normal and breath sounds without rales or wheezing  Abdominal: Soft. Bowel sounds are normal. NT. No HSM  Musculoskeletal: Normal range of motion. Exhibits no edema Lymphadenopathy: Has no other cervical adenopathy.  Neurological: Pt is alert and oriented to person, place, and time. Pt has normal reflexes. No cranial nerve deficit. Motor grossly intact, Gait intact Skin: Skin is warm and dry. No rash noted or new ulcerations Psychiatric:  Has normal mood and affect. Behavior is normal without agitation No other exam findings Lab Results  Component Value Date   WBC 5.2 06/20/2018   HGB 13.4 06/20/2018   HCT 40.8 06/20/2018   PLT 307.0 06/20/2018  GLUCOSE 119 (H) 06/20/2018   CHOL 185 06/20/2018   TRIG 66.0 06/20/2018   HDL 71.00 06/20/2018   LDLDIRECT 114.4 03/13/2013   LDLCALC 101 (H) 06/20/2018   ALT 12 06/20/2018   AST 14 06/20/2018   NA 139 06/20/2018   K 3.8 06/20/2018   CL 102 06/20/2018   CREATININE 0.59 06/20/2018   BUN 8 06/20/2018   CO2 30 06/20/2018   TSH 2.22 06/20/2018   INR 1.06 11/10/2016   HGBA1C 6.9 (H) 06/20/2018       Assessment & Plan:

## 2018-06-20 NOTE — Assessment & Plan Note (Signed)

## 2018-06-20 NOTE — Assessment & Plan Note (Signed)
Uncontrolled - for add hct 25

## 2018-06-22 ENCOUNTER — Encounter: Payer: Self-pay | Admitting: Family Medicine

## 2018-06-22 ENCOUNTER — Ambulatory Visit: Payer: 59 | Admitting: Family Medicine

## 2018-06-22 VITALS — BP 138/84 | HR 86 | Ht 59.5 in | Wt 222.0 lb

## 2018-06-22 DIAGNOSIS — M79672 Pain in left foot: Secondary | ICD-10-CM | POA: Diagnosis not present

## 2018-06-22 MED ORDER — DICLOFENAC SODIUM 2 % TD SOLN: 1.0000 "application " | Freq: Two times a day (BID) | TRANSDERMAL | 3 refills | Status: DC

## 2018-06-22 NOTE — Addendum Note (Signed)
Addended by: Scarlett Presto on: 06/22/2018 03:25 PM   Modules accepted: Orders

## 2018-06-22 NOTE — Patient Instructions (Signed)
Nice to meet you  Please try the pennsaid Please try the heel lifts  Please follow up with m ein 3-4 weeks if your pain hasn't improved.

## 2018-06-22 NOTE — Progress Notes (Signed)
Bianca Campbell - 62 y.o. female MRN [id number]  Date of birth: [birth year]  SUBJECTIVE:  Including CC & ROS.  Chief Complaint  Patient presents with  . Foot Pain    Bianca Campbell is a 62 y.o. female that is presenting with left foot pain. Ongoing for three months. Pain is located at her achilles and proximal lateral malleolus.  Admits to swelling. Pain is mild to severe during dorsiflexion and plantar extension. Worse when standing. Denies trauma or injury. She has not taken anything for the pain.      Review of Systems  Constitutional: Negative for fever.  HENT: Negative for congestion.   Respiratory: Negative for cough.   Cardiovascular: Negative for chest pain.  Gastrointestinal: Negative for abdominal pain.  Musculoskeletal: Negative for back pain.  Neurological: Negative for weakness.  Hematological: Negative for adenopathy.  Psychiatric/Behavioral: Negative for agitation.    HISTORY: Past Medical, Surgical, Social, and Family History Reviewed & Updated per EMR.   Pertinent Historical Findings include:  Past Medical History:  Diagnosis Date  . ALLERGIC RHINITIS 09/11/2007  . Anemia, unspecified 08/31/2012  . Arthritis    "knees" (11/23/2016)  . BACK PAIN   . EXTERNAL OTITIS 09/11/2007  . GERD 06/06/2007  . GOUT 11/14/2007  . High cholesterol   . HYPERTENSION 06/06/2007  . INTERMITTENT VERTIGO 09/11/2007  . Irritable bowel syndrome 06/06/2007  . LOW BACK PAIN 06/06/2007  . OSTEOARTHRITIS 06/06/2007  . PONV (postoperative nausea and vomiting)   . Primary localized osteoarthritis of right knee   . SHOULDER PAIN, RIGHT 12/30/2008    Past Surgical History:  Procedure Laterality Date  . BILATERAL SALPINGOOPHORECTOMY    . CESAREAN SECTION  1987, 1991  . COLONOSCOPY     pt stated 2015 or 2016 unknown md  . EXPLORATORY LAPAROTOMY     bowel obstruction  . INGUINAL HERNIA REPAIR Bilateral    numerous hernia repairs 1996 to 2011  . JOINT REPLACEMENT    . KNEE  ARTHROSCOPY Bilateral 2013 - 11/11/2015  . LAPAROSCOPIC CHOLECYSTECTOMY  1991  . s/p incarcerated hernia and ovary cyst Jan 2011  2011  . TOTAL KNEE ARTHROPLASTY Right 11/21/2016   Procedure: RIGHT TOTAL KNEE ARTHROPLASTY;  Surgeon: Salvatore Marvel, MD;  Location: [facility] OR;  Service: Orthopedics;  Laterality: Right;  . UMBILICAL HERNIA REPAIR     numerous hernia repairs 1996 to 2011    Allergies  Allergen Reactions  . Latex Itching    Family History  Problem Relation Age of Onset  . Arthritis Mother        RA  . Colon polyps Father 53  . Colon cancer Neg Hx      Social History   Socioeconomic History  . Marital status: Married    Spouse name: Not on file  . Number of children: Not on file  . Years of education: Not on file  . Highest education level: Not on file  Occupational History  . Occupation: unemployed recently laid off  Social Needs  . Financial resource strain: Not on file  . Food insecurity:    Worry: Not on file    Inability: Not on file  . Transportation needs:    Medical: Not on file    Non-medical: Not on file  Tobacco Use  . Smoking status: Never Smoker  . Smokeless tobacco: Never Used  Substance and Sexual Activity  . Alcohol use: No  . Drug use: No  . Sexual activity: Never  Lifestyle  . Physical activity:  Days per week: Not on file    Minutes per session: Not on file  . Stress: Not on file  Relationships  . Social connections:    Talks on phone: Not on file    Gets together: Not on file    Attends religious service: Not on file    Active member of club or organization: Not on file    Attends meetings of clubs or organizations: Not on file    Relationship status: Not on file  . Intimate partner violence:    Fear of current or ex partner: Not on file    Emotionally abused: Not on file    Physically abused: Not on file    Forced sexual activity: Not on file  Other Topics Concern  . Not on file  Social History Narrative  . Not on file      PHYSICAL EXAM:  VS: BP 138/84   Pulse 86   Ht 4' 11.5" (1.511 m)   Wt 222 lb (100.7 kg)   SpO2 98%   BMI 44.09 kg/m  Physical Exam Gen: NAD, alert, cooperative with exam, well-appearing ENT: normal lips, normal nasal mucosa,  Eye: normal EOM, normal conjunctiva and lids CV:  no edema, +2 pedal pulses   Resp: no accessory muscle use, non-labored,  Skin: no rashes, no areas of induration  Neuro: normal tone, normal sensation to touch Psych:  normal insight, alert and oriented MSK:  Left foot:  Medial subluxation of the subtalar joint  Normal ROM of ankle  Normal strength to resistance  neurovascularly intact    Limited ultrasound: left foot:  Normal-appearing posterior tibialis tendon. Normal-appearing Achilles.  There is a hypoechoic change deep to the Achilles to suggest a retrocalcaneal bursitis. Normal-appearing plantar fascia  Summary: Findings suggestive of a retrocalcaneal bursitis.  Ultrasound and interpretation by Clare Gandy, MD          ASSESSMENT & PLAN:   Left foot pain Pain seems to be suggestive of the retrocalcaneal bursitis.  Her gait and hindfoot structural changes could be contributing. -Pennsaid -Counseled on supportive care -Can try some insoles -If no improvement will consider injection.

## 2018-06-25 DIAGNOSIS — M79672 Pain in left foot: Secondary | ICD-10-CM | POA: Insufficient documentation

## 2018-06-25 NOTE — Assessment & Plan Note (Signed)
Pain seems to be suggestive of the retrocalcaneal bursitis.  Her gait and hindfoot structural changes could be contributing. -Pennsaid -Counseled on supportive care -Can try some insoles -If no improvement will consider injection.

## 2018-07-11 ENCOUNTER — Ambulatory Visit (INDEPENDENT_AMBULATORY_CARE_PROVIDER_SITE_OTHER): Payer: 59

## 2018-07-11 DIAGNOSIS — R002 Palpitations: Secondary | ICD-10-CM

## 2018-08-13 ENCOUNTER — Ambulatory Visit
Admission: RE | Admit: 2018-08-13 | Discharge: 2018-08-13 | Disposition: A | Discharge status: Home / Self Care | Payer: 59 | Source: Ambulatory Visit | Attending: Internal Medicine | Admitting: Internal Medicine

## 2018-08-13 DIAGNOSIS — R42 Dizziness and giddiness: Secondary | ICD-10-CM | POA: Diagnosis not present

## 2018-08-13 DIAGNOSIS — H814 Vertigo of central origin: Secondary | ICD-10-CM

## 2018-08-15 ENCOUNTER — Other Ambulatory Visit: Payer: Self-pay | Admitting: Internal Medicine

## 2018-08-15 ENCOUNTER — Telehealth: Payer: Self-pay

## 2018-08-15 DIAGNOSIS — R9089 Other abnormal findings on diagnostic imaging of central nervous system: Secondary | ICD-10-CM

## 2018-08-15 NOTE — Telephone Encounter (Signed)
-----   Message from [name], MD sent at 08/15/2018  4:04 AM EDT ----- Also be sure to mention the aspirin 81 mg per day - very important

## 2018-08-15 NOTE — Telephone Encounter (Signed)
Pt has been informed of results and expressed understanding.  °

## 2018-08-15 NOTE — Telephone Encounter (Signed)
-----   Message from [name], MD sent at 08/15/2018  3:53 AM EDT ----- OK to let pt know that the monitor did not show any signficant heart rhythm abnormality, and no further specific evaluation or tx is needed from these results; her symptoms do not appear to be associated with heart rhythm abnormality

## 2018-08-20 ENCOUNTER — Encounter: Payer: Self-pay | Admitting: Neurology

## 2018-08-25 ENCOUNTER — Observation Stay (HOSPITAL_COMMUNITY): Payer: 59

## 2018-08-25 ENCOUNTER — Observation Stay (HOSPITAL_COMMUNITY)
Admission: EM | Admit: 2018-08-25 | Discharge: 2018-08-28 | Disposition: A | Discharge status: Home / Self Care | Payer: 59 | Attending: Family Medicine | Admitting: Family Medicine

## 2018-08-25 ENCOUNTER — Emergency Department (HOSPITAL_COMMUNITY): Payer: 59

## 2018-08-25 ENCOUNTER — Other Ambulatory Visit: Payer: Self-pay

## 2018-08-25 ENCOUNTER — Encounter (HOSPITAL_COMMUNITY): Payer: Self-pay | Admitting: *Deleted

## 2018-08-25 DIAGNOSIS — R1013 Epigastric pain: Secondary | ICD-10-CM | POA: Diagnosis present

## 2018-08-25 DIAGNOSIS — K56609 Unspecified intestinal obstruction, unspecified as to partial versus complete obstruction: Secondary | ICD-10-CM | POA: Diagnosis not present

## 2018-08-25 DIAGNOSIS — M545 Low back pain: Secondary | ICD-10-CM | POA: Diagnosis not present

## 2018-08-25 DIAGNOSIS — Z23 Encounter for immunization: Secondary | ICD-10-CM | POA: Diagnosis not present

## 2018-08-25 DIAGNOSIS — Z96651 Presence of right artificial knee joint: Secondary | ICD-10-CM | POA: Insufficient documentation

## 2018-08-25 DIAGNOSIS — R109 Unspecified abdominal pain: Secondary | ICD-10-CM | POA: Diagnosis not present

## 2018-08-25 DIAGNOSIS — K56600 Partial intestinal obstruction, unspecified as to cause: Secondary | ICD-10-CM | POA: Diagnosis not present

## 2018-08-25 DIAGNOSIS — G8929 Other chronic pain: Secondary | ICD-10-CM | POA: Insufficient documentation

## 2018-08-25 DIAGNOSIS — Z79899 Other long term (current) drug therapy: Secondary | ICD-10-CM | POA: Diagnosis not present

## 2018-08-25 DIAGNOSIS — Z0189 Encounter for other specified special examinations: Secondary | ICD-10-CM

## 2018-08-25 DIAGNOSIS — Z91040 Latex allergy status: Secondary | ICD-10-CM | POA: Insufficient documentation

## 2018-08-25 DIAGNOSIS — I10 Essential (primary) hypertension: Secondary | ICD-10-CM | POA: Diagnosis not present

## 2018-08-25 DIAGNOSIS — R1084 Generalized abdominal pain: Secondary | ICD-10-CM | POA: Diagnosis not present

## 2018-08-25 DIAGNOSIS — K56699 Other intestinal obstruction unspecified as to partial versus complete obstruction: Principal | ICD-10-CM | POA: Insufficient documentation

## 2018-08-25 DIAGNOSIS — R112 Nausea with vomiting, unspecified: Secondary | ICD-10-CM | POA: Diagnosis not present

## 2018-08-25 LAB — COMPREHENSIVE METABOLIC PANEL
ALT: 18 U/L (ref 0–44)
ANION GAP: 11 (ref 5–15)
AST: 27 U/L (ref 15–41)
Albumin: 4.5 g/dL (ref 3.5–5.0)
Alkaline Phosphatase: 82 U/L (ref 38–126)
BUN: 10 mg/dL (ref 8–23)
CHLORIDE: 102 mmol/L (ref 98–111)
CO2: 23 mmol/L (ref 22–32)
Calcium: 9.5 mg/dL (ref 8.9–10.3)
Creatinine, Ser: 0.65 mg/dL (ref 0.44–1.00)
GFR calc non Af Amer: >60 mL/min (ref >60)
Glucose, Bld: 154 mg/dL — ABNORMAL HIGH (ref 70–99)
POTASSIUM: 4.3 mmol/L (ref 3.5–5.1)
SODIUM: 136 mmol/L (ref 135–145)
Total Bilirubin: 1.3 mg/dL — ABNORMAL HIGH (ref 0.3–1.2)
Total Protein: 8.5 g/dL — ABNORMAL HIGH (ref 6.5–8.1)

## 2018-08-25 LAB — URINALYSIS, ROUTINE W REFLEX MICROSCOPIC
Bilirubin Urine: NEGATIVE
GLUCOSE, UA: NEGATIVE mg/dL
Hgb urine dipstick: NEGATIVE
KETONES UR: 20 mg/dL — AB
LEUKOCYTES UA: NEGATIVE
NITRITE: NEGATIVE
PH: 7 (ref 5.0–8.0)
Protein, ur: NEGATIVE mg/dL
Specific Gravity, Urine: 1.015 (ref 1.005–1.030)

## 2018-08-25 LAB — CBC
HEMATOCRIT: 41.3 % (ref 36.0–46.0)
HEMOGLOBIN: 12.8 g/dL (ref 12.0–15.0)
MCH: 26.4 pg (ref 26.0–34.0)
MCHC: 31 g/dL (ref 30.0–36.0)
MCV: 85.2 fL (ref 80.0–100.0)
NRBC: 0 % (ref 0.0–0.2)
Platelets: 285 10*3/uL (ref 150–400)
RBC: 4.85 MIL/uL (ref 3.87–5.11)
RDW: 13.6 % (ref 11.5–15.5)
WBC: 7.3 10*3/uL (ref 4.0–10.5)

## 2018-08-25 LAB — LIPASE, BLOOD: LIPASE: 22 U/L (ref 11–51)

## 2018-08-25 MED ORDER — LIDOCAINE HCL URETHRAL/MUCOSAL 2 % EX GEL
1.0000 "application " | Freq: Once | CUTANEOUS | Status: AC
  Administered 2018-08-25: 1 via TOPICAL
  Filled 2018-08-25: qty 5

## 2018-08-25 MED ORDER — HYDROMORPHONE HCL 1 MG/ML IJ SOLN
0.5000 mg | Freq: Once | INTRAMUSCULAR | Status: AC
  Administered 2018-08-25: 0.5 mg via INTRAVENOUS
  Filled 2018-08-25: qty 1

## 2018-08-25 MED ORDER — ENOXAPARIN SODIUM 40 MG/0.4ML ~~LOC~~ SOLN
40.0000 mg | Freq: Every day | SUBCUTANEOUS | Status: DC
  Administered 2018-08-27: 40 mg via SUBCUTANEOUS
  Filled 2018-08-25: qty 0.4

## 2018-08-25 MED ORDER — SODIUM CHLORIDE 0.9 % IV SOLN
INTRAVENOUS | Status: DC
  Administered 2018-08-25 – 2018-08-27 (×4): via INTRAVENOUS

## 2018-08-25 MED ORDER — IOPAMIDOL (ISOVUE-300) INJECTION 61%
INTRAVENOUS | Status: AC
  Filled 2018-08-25: qty 100

## 2018-08-25 MED ORDER — MORPHINE SULFATE (PF) 2 MG/ML IV SOLN
2.0000 mg | INTRAVENOUS | Status: DC | PRN
  Administered 2018-08-25 – 2018-08-26 (×4): 2 mg via INTRAVENOUS
  Filled 2018-08-25 (×4): qty 1

## 2018-08-25 MED ORDER — DIATRIZOATE MEGLUMINE & SODIUM 66-10 % PO SOLN
90.0000 mL | Freq: Once | ORAL | Status: AC
  Administered 2018-08-25: 90 mL via NASOGASTRIC
  Filled 2018-08-25: qty 90

## 2018-08-25 MED ORDER — PHENOL 1.4 % MT LIQD
1.0000 | OROMUCOSAL | Status: DC | PRN
  Filled 2018-08-25: qty 177

## 2018-08-25 MED ORDER — SODIUM CHLORIDE (PF) 0.9 % IJ SOLN
INTRAMUSCULAR | Status: AC
  Filled 2018-08-25: qty 50

## 2018-08-25 MED ORDER — METOCLOPRAMIDE HCL 5 MG/ML IJ SOLN
10.0000 mg | Freq: Once | INTRAMUSCULAR | Status: AC
  Administered 2018-08-25: 10 mg via INTRAVENOUS
  Filled 2018-08-25: qty 2

## 2018-08-25 MED ORDER — DIPHENHYDRAMINE HCL 50 MG/ML IJ SOLN
12.5000 mg | Freq: Once | INTRAMUSCULAR | Status: AC
  Administered 2018-08-25: 12.5 mg via INTRAVENOUS
  Filled 2018-08-25: qty 1

## 2018-08-25 MED ORDER — HYDRALAZINE HCL 20 MG/ML IJ SOLN: 10.0000 mg | Freq: Four times a day (QID) | INTRAMUSCULAR | Status: DC | PRN

## 2018-08-25 MED ORDER — ONDANSETRON HCL 4 MG/2ML IJ SOLN
4.0000 mg | Freq: Four times a day (QID) | INTRAMUSCULAR | Status: DC | PRN
  Administered 2018-08-25: 4 mg via INTRAVENOUS
  Filled 2018-08-25: qty 2

## 2018-08-25 MED ORDER — INFLUENZA VAC SPLIT QUAD 0.5 ML IM SUSY
0.5000 mL | PREFILLED_SYRINGE | INTRAMUSCULAR | Status: AC
  Administered 2018-08-27: 0.5 mL via INTRAMUSCULAR
  Filled 2018-08-25: qty 0.5

## 2018-08-25 MED ORDER — IOPAMIDOL (ISOVUE-300) INJECTION 61%
100.0000 mL | Freq: Once | INTRAVENOUS | Status: AC | PRN
  Administered 2018-08-25: 100 mL via INTRAVENOUS

## 2018-08-25 NOTE — ED Provider Notes (Signed)
[facility]-EMERGENCY DEPT Provider Note   CSN: [id number] Arrival date & time: 08/25/18  0137     History   Chief Complaint Chief Complaint  Patient presents with  . Abdominal Pain    HPI Bianca Campbell is a 62 y.o. female.  Patient with history of GERD, HTN, intermittent vertigo, chronic low back pain, HLD presents with sudden onset of epigastric abdominal pain last evening around midnight. She had later onset of nausea and vomiting. No diarrhea. Last bowel movement was yesterday and she reports 2 separate nonbloody bowel movements. No fever, chest pain, SOB, cough. The pain radiates into her back. No history of similar symptoms. She is not aware of risk of contaminated food.      Past Medical History:  Diagnosis Date  . ALLERGIC RHINITIS 09/11/2007  . Anemia, unspecified 08/31/2012  . Arthritis    "knees" (11/23/2016)  . BACK PAIN   . EXTERNAL OTITIS 09/11/2007  . GERD 06/06/2007  . GOUT 11/14/2007  . High cholesterol   . HYPERTENSION 06/06/2007  . INTERMITTENT VERTIGO 09/11/2007  . Irritable bowel syndrome 06/06/2007  . LOW BACK PAIN 06/06/2007  . OSTEOARTHRITIS 06/06/2007  . PONV (postoperative nausea and vomiting)   . Primary localized osteoarthritis of right knee   . SHOULDER PAIN, RIGHT 12/30/2008    Patient Active Problem List   Diagnosis Date Noted  . Left foot pain 06/25/2018  . Vertigo of central origin 06/20/2018  . Left ankle pain 06/20/2018  . Palpitations 06/20/2018  . Hyperglycemia 06/20/2018  . Lateral epicondylitis, right elbow 09/18/2017  . Hypokalemia 06/27/2017  . Encounter for imaging study to confirm nasogastric (NG) tube placement   . SBO (small bowel obstruction) (HCC) 06/13/2017  . 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 Thrush 12/13/2016  . Primary localized osteoarthritis of right knee   . Abdominal pain 01/15/2016  . Orthostasis 09/15/2015  . Pain of multiple sites 01/15/2015  . Bunion, right foot 12/30/2014  . Elevated  total protein 09/09/2014  . Hyperlipidemia 03/13/2013  . Unspecified vitamin D deficiency 03/13/2013  . Bilateral foot pain 08/31/2012  . Anemia, unspecified 08/31/2012  . Pain in both feet 07/10/2012  . Medial epicondylitis of left elbow 07/10/2012  . Medial epicondylitis of right elbow 07/10/2012  . Left knee pain 07/10/2012  . Chest pain 05/22/2012  . Right shoulder pain 05/22/2012  . Right foot strain 05/22/2012  . Right knee pain 03/25/2012  . Encounter for well adult exam with abnormal findings 06/24/2011  . Impaired glucose tolerance 06/24/2011  . Gastroenteritis 06/24/2011  . Hypercalcemia 06/24/2011  . SHOULDER PAIN, RIGHT 12/30/2008  . Gout 11/14/2007  . Backache 11/14/2007  . ALLERGIC RHINITIS 09/11/2007  . INTERMITTENT VERTIGO 09/11/2007  . Essential hypertension 06/06/2007  . GERD 06/06/2007  . Irritable bowel syndrome 06/06/2007  . OSTEOARTHRITIS 06/06/2007  . Low back pain 06/06/2007    Past Surgical History:  Procedure Laterality Date  . BILATERAL SALPINGOOPHORECTOMY    . CESAREAN SECTION  1987, 1991  . COLONOSCOPY     pt stated 2015 or 2016 unknown md  . EXPLORATORY LAPAROTOMY     bowel obstruction  . INGUINAL HERNIA REPAIR Bilateral    numerous hernia repairs 1996 to 2011  . JOINT REPLACEMENT    . KNEE ARTHROSCOPY Bilateral 2013 - 11/11/2015  . LAPAROSCOPIC CHOLECYSTECTOMY  1991  . s/p incarcerated hernia and ovary cyst Jan 2011  2011  . TOTAL KNEE ARTHROPLASTY Right 11/21/2016   Procedure: RIGHT TOTAL KNEE ARTHROPLASTY;  Surgeon:  Salvatore Marvel, MD;  Location: [facility] OR;  Service: Orthopedics;  Laterality: Right;  . UMBILICAL HERNIA REPAIR     numerous hernia repairs 1996 to 2011     OB History   None      Home Medications    Prior to Admission medications   Medication Sig Start Date End Date Taking? Authorizing Provider  amLODipine (NORVASC) 10 MG tablet TAKE ONE TABLET BY MOUTH ONCE DAILY Patient taking differently: Take 10 mg by mouth daily.   05/09/17  Yes [name], MD  calcium carbonate (TUMS - DOSED IN MG ELEMENTAL CALCIUM) 500 MG chewable tablet Chew 1 tablet by mouth daily.   Yes [provider]  cetirizine (ZYRTEC) 10 MG tablet Take 1 tablet (10 mg total) by mouth daily. 06/20/18 06/20/19 Yes [name], MD  Cholecalciferol (VITAMIN D3) 5000 units CAPS Take 5,000 Units by mouth daily.    Yes [provider]  diclofenac (VOLTAREN) 75 MG EC tablet Take 75 mg by mouth daily as needed for mild pain.   Yes [provider]  dicyclomine (BENTYL) 20 MG tablet TAKE ONE TABLET BY MOUTH THREE TIMES DAILY BEFORE MEAL(S) Patient taking differently: Take 20 mg by mouth 3 (three) times daily before meals.  07/14/16  Yes [name], MD  irbesartan (AVAPRO) 300 MG tablet TAKE ONE TABLET BY MOUTH ONCE DAILY 05/09/17  Yes [name], MD  meclizine (ANTIVERT) 12.5 MG tablet Take 1 tablet (12.5 mg total) by mouth 3 (three) times daily as needed for dizziness. 06/20/18 06/20/19 Yes [name], MD  meloxicam (MOBIC) 15 MG tablet Take 1 tablet (15 mg total) by mouth daily as needed for pain. 09/18/17  Yes [name], MD  methocarbamol (ROBAXIN) 500 MG tablet Take 500 mg by mouth every 6 (six) hours as needed for muscle spasms.  06/22/18  Yes [provider]  ondansetron (ZOFRAN) 4 MG tablet Take 1 tablet (4 mg total) by mouth every 8 (eight) hours as needed for nausea or vomiting. 06/20/18  Yes [name], MD  OVER THE COUNTER MEDICATION Take 1 tablet by mouth daily. Product name: Curamin (OTC organic pain reliever)   Yes [provider]  polyethylene glycol (MIRALAX / GLYCOLAX) packet 17grams in 6 oz of water twice a day until bowel movement.  LAXITIVE.  Restart if two days since last bowel movement 11/24/16  Yes Shepperson, Kirstin, PA-C  Diclofenac Sodium (PENNSAID) 2 % SOLN Place 1 application onto the skin 2 (two) times daily. Patient not taking: Reported on 08/25/2018 06/22/18   Myra Rude, MD    docusate sodium (COLACE) 100 MG capsule 1 tab 2 times a day while on narcotics.  STOOL SOFTENER Patient not taking: Reported on 08/25/2018 11/24/16   Shepperson, Kirstin, PA-C  hydrochlorothiazide (HYDRODIURIL) 25 MG tablet Take 1 tablet (25 mg total) by mouth daily. Patient not taking: Reported on 08/25/2018 06/20/18   [name], MD  triamcinolone (NASACORT) 55 MCG/ACT AERO nasal inhaler Place 2 sprays into the nose daily. Patient not taking: Reported on 08/25/2018 06/20/18   [name], MD    Family History Family History  Problem Relation Age of Onset  . Arthritis Mother        RA  . Colon polyps Father 33  . Colon cancer Neg Hx     Social History Social History   Tobacco Use  . Smoking status: Never Smoker  . Smokeless tobacco: Never Used  Substance Use Topics  .  Alcohol use: No  . Drug use: No     Allergies   Latex   Review of Systems Review of Systems  Constitutional: Negative for chills and fever.  HENT: Negative.   Respiratory: Negative.  Negative for shortness of breath.   Cardiovascular: Negative.  Negative for chest pain.  Gastrointestinal: Positive for abdominal pain, nausea and vomiting. Negative for blood in stool, constipation and diarrhea.  Genitourinary: Negative.  Negative for decreased urine volume and dysuria.  Musculoskeletal: Negative.   Skin: Negative.   Neurological: Negative.  Negative for dizziness, weakness and light-headedness.     Physical Exam Updated Vital Signs BP 116/70   Pulse 86   Temp 98.5 F (36.9 C) (Oral)   Resp 15   SpO2 98%   Physical Exam  Constitutional: She appears well-developed and well-nourished.  Uncomfortable appearing.  HENT:  Head: Normocephalic.  Neck: Normal range of motion. Neck supple.  Cardiovascular: Normal rate and regular rhythm.  No murmur heard. Pulmonary/Chest: Effort normal and breath sounds normal. She has no wheezes. She has no rhonchi. She has no rales.  Abdominal: Soft. Bowel sounds are  decreased. There is tenderness in the epigastric area. There is guarding. There is no rebound. No hernia.  Musculoskeletal: Normal range of motion.  Neurological: She is alert. No cranial nerve deficit.  Skin: Skin is warm and dry. No rash noted.  Psychiatric: She has a normal mood and affect.  Nursing note and vitals reviewed.    ED Treatments / Results  Labs (all labs ordered are listed, but only abnormal results are displayed) Labs Reviewed  COMPREHENSIVE METABOLIC PANEL - Abnormal; Notable for the following components:      Result Value   Glucose, Bld 154 (*)    Total Protein 8.5 (*)    Total Bilirubin 1.3 (*)    All other components within normal limits  URINALYSIS, ROUTINE W REFLEX MICROSCOPIC - Abnormal; Notable for the following components:   Color, Urine STRAW (*)    Ketones, ur 20 (*)    All other components within normal limits  LIPASE, BLOOD  CBC    EKG None  Radiology Dg Abdomen Acute W/chest  Result Date: 08/25/2018 CLINICAL DATA:  Acute onset of generalized abdominal spasms, nausea and vomiting. EXAM: DG ABDOMEN ACUTE W/ 1V CHEST COMPARISON:  Chest radiograph performed 01/05/2016, and abdominal radiograph performed 06/14/2017 FINDINGS: There is elevation of the right hemidiaphragm. There is no evidence of focal opacification, pleural effusion or pneumothorax. The cardiomediastinal silhouette is borderline normal in size. The visualized bowel gas pattern is unremarkable. Scattered stool and air are seen within the colon; there is no evidence of small bowel dilatation to suggest obstruction. No free intra-abdominal air is identified on the provided upright view. Clips are noted within the right upper quadrant, reflecting prior cholecystectomy. No acute osseous abnormalities are seen; the sacroiliac joints are unremarkable in appearance. IMPRESSION: 1. Unremarkable bowel gas pattern; no free intra-abdominal air seen. Small to moderate amount of stool noted in the colon.  2. No acute cardiopulmonary process seen. Electronically Signed   By: Roanna Raider M.D.   On: 08/25/2018 05:39    Procedures Procedures (including critical care time)  Medications Ordered in ED Medications  HYDROmorphone (DILAUDID) injection 0.5 mg (0.5 mg Intravenous Given 08/25/18 0521)  metoCLOPramide (REGLAN) injection 10 mg (10 mg Intravenous Given 08/25/18 0520)  diphenhydrAMINE (BENADRYL) injection 12.5 mg (12.5 mg Intravenous Given 08/25/18 0520)     Initial Impression / Assessment and Plan / ED Course  I have reviewed the triage vital signs and the nursing notes.  Pertinent labs & imaging results that were available during my care of the patient were reviewed by me and considered in my medical decision making (see chart for details).     Patient arrives with c/o upper abdominal pain, N, V with sudden onset around midnight.   Medications for pain and nausea provided with improvement in her symptoms. Exam concerning for significant abdominal tenderness. Nausea and vomiting are controlled.   Labs are reassuring. No leukocytosis, mild hyperglycemia. Abdominal series done and is unremarkable with some stool and normal BGP. She continues to be significantly tender to palpation. CT scan ordered for further evaluation. She is seen and evaluated by Dr. Preston Fleeting who agrees.   Patient care signed out to [facility], PA-C, pending re-examination, review of CT scan and appropriate disposition.   Final Clinical Impressions(s) / ED Diagnoses   Final diagnoses:  None   1. Abdominal pain 2. Nausea and vomiting  ED Discharge Orders    None       Elpidio Anis, Cordelia Poche 08/25/18 0729    Dione Booze, MD 08/25/18 [phone]

## 2018-08-25 NOTE — ED Notes (Signed)
I have just given report to Minkler, RN on [address]. Will transport now.

## 2018-08-25 NOTE — ED Notes (Signed)
ED TO INPATIENT HANDOFF REPORT  Name/Age/Gender Bianca Campbell 62 y.o. female  Code Status    Code Status Orders  (From admission, onward)         Start     Ordered   08/25/18 1018  Full code  Continuous     08/25/18 1018        Code Status History    Date Active Date Inactive Code Status Order ID Comments User Context   06/13/2017 0311 06/17/2017 1630 Full Code [id number]  Rise Patience, MD ED   11/21/2016 1158 11/24/2016 1428 Full Code [id number]  Matthew Saras, PA-C Inpatient      Home/SNF/Other Home  Chief Complaint abdominal pain  Level of Care/Admitting Diagnosis ED Disposition    ED Disposition Condition [facility] Area: [facility] [[id number]]  Level of Care: Med-Surg [16]  Diagnosis: SBO (small bowel obstruction) [facility]) [[id number]]  Admitting Physician: Shelly Coss [[id number]]  Attending Physician: Shelly Coss [[id number]]  PT Class (Do Not Modify): Observation [104]  PT Acc Code (Do Not Modify): Observation [10022]       Medical History Past Medical History:  Diagnosis Date  . ALLERGIC RHINITIS 09/11/2007  . Anemia, unspecified 08/31/2012  . Arthritis    "knees" (11/23/2016)  . BACK PAIN   . EXTERNAL OTITIS 09/11/2007  . GERD 06/06/2007  . GOUT 11/14/2007  . High cholesterol   . HYPERTENSION 06/06/2007  . INTERMITTENT VERTIGO 09/11/2007  . Irritable bowel syndrome 06/06/2007  . LOW BACK PAIN 06/06/2007  . OSTEOARTHRITIS 06/06/2007  . PONV (postoperative nausea and vomiting)   . Primary localized osteoarthritis of right knee   . SHOULDER PAIN, RIGHT 12/30/2008    Allergies Allergies  Allergen Reactions  . Latex Itching    IV Location/Drains/Wounds Patient Lines/Drains/Airways Status   Active Line/Drains/Airways    Name:   Placement date:   Placement time:   Site:   Days:   Peripheral IV 08/25/18 Left Antecubital   08/25/18    0245    Antecubital   less than 1          Labs/Imaging Results for  orders placed or performed during the hospital encounter of 08/25/18 (from the past 48 hour(s))  Lipase, blood     Status: None   Collection Time: 08/25/18  2:41 AM  Result Value Ref Range   Lipase 22 11 - 51 U/L    Comment: Performed at [facility], [place] [address], [place], [place] [address]  Comprehensive metabolic panel     Status: Abnormal   Collection Time: 08/25/18  2:41 AM  Result Value Ref Range   Sodium 136 135 - 145 mmol/L   Potassium 4.3 3.5 - 5.1 mmol/L   Chloride 102 98 - 111 mmol/L   CO2 23 22 - 32 mmol/L   Glucose, Bld 154 (H) 70 - 99 mg/dL   BUN 10 8 - 23 mg/dL   Creatinine, Ser 0.65 0.44 - 1.00 mg/dL   Calcium 9.5 8.9 - 10.3 mg/dL   Total Protein 8.5 (H) 6.5 - 8.1 g/dL   Albumin 4.5 3.5 - 5.0 g/dL   AST 27 15 - 41 U/L   ALT 18 0 - 44 U/L   Alkaline Phosphatase 82 38 - 126 U/L   Total Bilirubin 1.3 (H) 0.3 - 1.2 mg/dL   GFR calc non Af Amer >60 >60 mL/min   GFR calc Af Amer >60 >60 mL/min    Comment: (NOTE) The eGFR  has been calculated using the CKD EPI equation. This calculation has not been validated in all clinical situations. eGFR's persistently <60 mL/min signify possible Chronic Kidney Disease.    Anion gap 11 5 - 15    Comment: Performed at [facility], [place] [address] [place], [place] [address]  CBC     Status: None   Collection Time: 08/25/18  2:41 AM  Result Value Ref Range   WBC 7.3 4.0 - 10.5 K/uL   RBC 4.85 3.87 - 5.11 MIL/uL   Hemoglobin 12.8 12.0 - 15.0 g/dL   HCT 41.3 36.0 - 46.0 %   MCV 85.2 80.0 - 100.0 fL   MCH 26.4 26.0 - 34.0 pg   MCHC 31.0 30.0 - 36.0 g/dL   RDW 13.6 11.5 - 15.5 %   Platelets 285 150 - 400 K/uL   nRBC 0.0 0.0 - 0.2 %    Comment: Performed at [facility], [place] [address] [place], [place] [address]  Urinalysis, Routine w reflex microscopic     Status: Abnormal   Collection Time: 08/25/18  2:41 AM  Result Value Ref Range   Color, Urine STRAW (A) YELLOW    APPearance CLEAR CLEAR   Specific Gravity, Urine 1.015 1.005 - 1.030   pH 7.0 5.0 - 8.0   Glucose, UA NEGATIVE NEGATIVE mg/dL   Hgb urine dipstick NEGATIVE NEGATIVE   Bilirubin Urine NEGATIVE NEGATIVE   Ketones, ur 20 (A) NEGATIVE mg/dL   Protein, ur NEGATIVE NEGATIVE mg/dL   Nitrite NEGATIVE NEGATIVE   Leukocytes, UA NEGATIVE NEGATIVE    Comment: Performed at [place] [address] [place], [place] [address]   Ct Abdomen Pelvis W Contrast  Result Date: 08/25/2018 CLINICAL DATA:  Abdominal pain, nausea and vomiting. EXAM: CT ABDOMEN AND PELVIS WITH CONTRAST TECHNIQUE: Multidetector CT imaging of the abdomen and pelvis was performed using the standard protocol following bolus administration of intravenous contrast. CONTRAST:  169m ISOVUE-300 IOPAMIDOL (ISOVUE-300) INJECTION 61% COMPARISON:  06/13/2017 and 07/17/2012 FINDINGS: Lower chest: Lung bases are unremarkable with minimal linear atelectasis/scarring. Mild stable cardiomegaly. Hepatobiliary: Previous cholecystectomy. Liver and biliary tree are normal. Pancreas: Normal. Spleen: Normal. Adrenals/Urinary Tract: Adrenal glands are normal. Kidneys are normal in size without hydronephrosis or nephrolithiasis. Ureters and bladder are normal. Stomach/Bowel: Stomach is normal. There are a few fluid-filled minimally dilated small bowel loops over the lower abdomen unchanged and clustered mostly over the midline lower abdomen/pelvis and unchanged from the previous exam. Appendix is normal. Air is present throughout the colon. The descending colon is somewhat decompressed. Vascular/Lymphatic: Normal. Reproductive: Unremarkable. Other: No significant free fluid or focal inflammatory change. Postsurgical changes along the midline anterior lower abdominal wall. Musculoskeletal: Minimal degenerate change of the spine and hips. IMPRESSION: Several air and fluid-filled mildly dilated small bowel loops mostly clustered over the midline  lower abdomen/pelvis unchanged from the previous exam likely a stable chronic finding. Recurrent early/partial small bowel obstructive process is possible. Electronically Signed   By: DMarin OlpM.D.   On: 08/25/2018 07:31   Dg Abdomen Acute W/chest  Result Date: 08/25/2018 CLINICAL DATA:  Acute onset of generalized abdominal spasms, nausea and vomiting. EXAM: DG ABDOMEN ACUTE W/ 1V CHEST COMPARISON:  Chest radiograph performed 01/05/2016, and abdominal radiograph performed 06/14/2017 FINDINGS: There is elevation of the right hemidiaphragm. There is no evidence of focal opacification, pleural effusion or pneumothorax. The cardiomediastinal silhouette is borderline normal in size. The visualized bowel gas pattern is unremarkable. Scattered stool  and air are seen within the colon; there is no evidence of small bowel dilatation to suggest obstruction. No free intra-abdominal air is identified on the provided upright view. Clips are noted within the right upper quadrant, reflecting prior cholecystectomy. No acute osseous abnormalities are seen; the sacroiliac joints are unremarkable in appearance. IMPRESSION: 1. Unremarkable bowel gas pattern; no free intra-abdominal air seen. Small to moderate amount of stool noted in the colon. 2. No acute cardiopulmonary process seen. Electronically Signed   By: Garald Balding M.D.   On: 08/25/2018 05:39   None  Pending Labs Unresulted Labs (From admission, onward)    Start     Ordered   08/26/18 3220  Basic metabolic panel  Tomorrow morning,   R     08/25/18 1018   08/26/18 0500  CBC  Tomorrow morning,   R     08/25/18 1018   08/25/18 1018  HIV antibody (Routine Testing)  Once,   R     08/25/18 1018          Vitals/Pain Today's Vitals   08/25/18 0836 08/25/18 0900 08/25/18 0930 08/25/18 1006  BP:  120/65 107/62 (!) 152/86  Pulse:  83 74 81  Resp:    17  Temp:      TempSrc:      SpO2:  98% 99% 95%  PainSc: 1        Isolation Precautions No active  isolations  Medications Medications  iopamidol (ISOVUE-300) 61 % injection (has no administration in time range)  sodium chloride (PF) 0.9 % injection (has no administration in time range)  morphine 2 MG/ML injection 2 mg (has no administration in time range)  ondansetron (ZOFRAN) injection 4 mg (has no administration in time range)  0.9 %  sodium chloride infusion (has no administration in time range)  hydrALAZINE (APRESOLINE) injection 10 mg (has no administration in time range)  enoxaparin (LOVENOX) injection 40 mg (has no administration in time range)  HYDROmorphone (DILAUDID) injection 0.5 mg (0.5 mg Intravenous Given 08/25/18 0521)  metoCLOPramide (REGLAN) injection 10 mg (10 mg Intravenous Given 08/25/18 0520)  diphenhydrAMINE (BENADRYL) injection 12.5 mg (12.5 mg Intravenous Given 08/25/18 0520)  iopamidol (ISOVUE-300) 61 % injection 100 mL (100 mLs Intravenous Contrast Given 08/25/18 0705)  HYDROmorphone (DILAUDID) injection 0.5 mg (0.5 mg Intravenous Given 08/25/18 0748)    Mobility walks

## 2018-08-25 NOTE — ED Notes (Signed)
Assisted patient to the RR via wheelchair.

## 2018-08-25 NOTE — Consult Note (Signed)
Reason for Consult: partial SBO Referring Physician: Dr Prescilla Sours Bianca Campbell is an 62 y.o. female.  HPI: 62yo woman with obesity, hypertension and multiple prior surgeries (see below)/ bowel obstructions presented to the emergency department with abdominal pain, nausea and vomiting.  last bowel movement was this morning. No melena or hematochezia. Her pain is similar to previous obstructions.   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 Bubba Camp (he notes she'd had multiple previous operations already, including cholecystectomy and c-sections, the most recent being repair of lower midline hernia with marlex plug and mesh)  Nov 2002: repair of recurrent ventral hernia with prolene mesh (unclear if intraperitoneal...seprafilm was used)- Dr. Bubba Camp  March 2004: repair of recurrent ventral hernia with incarcerated small bowel- seprafilm used, polypropylene mesh patch- Dr. Bubba Camp  Nov 2005: repair of recurrent, incarcerated ventral hernia with onlay Proceed mesh -Dr. Dalbert Batman  Jan 2011: lap converted to open repair of recurrent incarcerated ventral hernia with physio mesh, 75 minutes of adhesiolysis, Dr Dalbert Batman. Dr. Helane Rima performed BSO at that time.   Past Medical History:  Diagnosis Date  . ALLERGIC RHINITIS 09/11/2007  . Anemia, unspecified 08/31/2012  . Arthritis    "knees" (11/23/2016)  . BACK PAIN   . EXTERNAL OTITIS 09/11/2007  . GERD 06/06/2007  . GOUT 11/14/2007  . High cholesterol   . HYPERTENSION 06/06/2007  . INTERMITTENT VERTIGO 09/11/2007  . Irritable bowel syndrome 06/06/2007  . LOW BACK PAIN 06/06/2007  . OSTEOARTHRITIS 06/06/2007  . PONV (postoperative nausea and vomiting)   . Primary localized osteoarthritis of right knee   . SHOULDER PAIN, RIGHT 12/30/2008    Past Surgical History:  Procedure Laterality Date  . BILATERAL SALPINGOOPHORECTOMY    . [place]  . COLONOSCOPY     pt stated 2015 or 2016 unknown md  . EXPLORATORY LAPAROTOMY     bowel obstruction  . INGUINAL HERNIA REPAIR Bilateral    numerous hernia repairs 1996 to 2011  . JOINT REPLACEMENT    . KNEE ARTHROSCOPY Bilateral 2013 - 11/11/2015  . LAPAROSCOPIC CHOLECYSTECTOMY  1991  . s/p incarcerated hernia and ovary cyst Jan 2011  2011  . TOTAL KNEE ARTHROPLASTY Right 11/21/2016   Procedure: RIGHT TOTAL KNEE ARTHROPLASTY;  Surgeon: Elsie Saas, MD;  Location: [facility];  Service: Orthopedics;  Laterality: Right;  . UMBILICAL HERNIA REPAIR     numerous hernia repairs 1996 to 2011    Family History  Problem Relation Age of Onset  . Arthritis Mother        RA  . Colon polyps Father 37  . Colon cancer Neg Hx     Social History:  reports that she has never smoked. She has never used smokeless tobacco. She reports that she does not drink alcohol or use drugs.  Allergies:  Allergies  Allergen Reactions  . Latex Itching    Medications: I have reviewed the patient's current medications.  Results for orders placed or performed during the hospital encounter of 08/25/18 (from the past 48 hour(s))  Lipase, blood     Status: None   Collection Time: 08/25/18  2:41 AM  Result Value Ref Range   Lipase 22 11 - 51 U/L    Comment: Performed at [facility], [place] [address] [place], [place] [address]  Comprehensive metabolic panel     Status: Abnormal   Collection Time: 08/25/18  2:41 AM  Result Value Ref Range  Sodium 136 135 - 145 mmol/L   Potassium 4.3 3.5 - 5.1 mmol/L   Chloride 102 98 - 111 mmol/L   CO2 23 22 - 32 mmol/L   Glucose, Bld 154 (H) 70 - 99 mg/dL   BUN 10 8 - 23 mg/dL   Creatinine, Ser 0.65 0.44 - 1.00 mg/dL   Calcium 9.5 8.9 - 10.3 mg/dL   Total Protein 8.5 (H) 6.5 - 8.1 g/dL   Albumin 4.5 3.5 - 5.0 g/dL   AST 27 15 - 41 U/L   ALT 18 0 - 44 U/L   Alkaline Phosphatase 82 38 - 126 U/L   Total Bilirubin 1.3 (H) 0.3 - 1.2 mg/dL   GFR calc  non Af Amer >60 >60 mL/min   GFR calc Af Amer >60 >60 mL/min    Comment: (NOTE) The eGFR has been calculated using the CKD EPI equation. This calculation has not been validated in all clinical situations. eGFR's persistently <60 mL/min signify possible Chronic Kidney Disease.    Anion gap 11 5 - 15    Comment: Performed at [facility], [place] [address], [place], [place] [address]  CBC     Status: None   Collection Time: 08/25/18  2:41 AM  Result Value Ref Range   WBC 7.3 4.0 - 10.5 K/uL   RBC 4.85 3.87 - 5.11 MIL/uL   Hemoglobin 12.8 12.0 - 15.0 g/dL   HCT 41.3 36.0 - 46.0 %   MCV 85.2 80.0 - 100.0 fL   MCH 26.4 26.0 - 34.0 pg   MCHC 31.0 30.0 - 36.0 g/dL   RDW 13.6 11.5 - 15.5 %   Platelets 285 150 - 400 K/uL   nRBC 0.0 0.0 - 0.2 %    Comment: Performed at [facility], [place] [address] [place], [place] [address]  Urinalysis, Routine w reflex microscopic     Status: Abnormal   Collection Time: 08/25/18  2:41 AM  Result Value Ref Range   Color, Urine STRAW (A) YELLOW   APPearance CLEAR CLEAR   Specific Gravity, Urine 1.015 1.005 - 1.030   pH 7.0 5.0 - 8.0   Glucose, UA NEGATIVE NEGATIVE mg/dL   Hgb urine dipstick NEGATIVE NEGATIVE   Bilirubin Urine NEGATIVE NEGATIVE   Ketones, ur 20 (A) NEGATIVE mg/dL   Protein, ur NEGATIVE NEGATIVE mg/dL   Nitrite NEGATIVE NEGATIVE   Leukocytes, UA NEGATIVE NEGATIVE    Comment: Performed at [place] [address] [place],  [address]    Ct Abdomen Pelvis W Contrast  Result Date: 08/25/2018 CLINICAL DATA:  Abdominal pain, nausea and vomiting. EXAM: CT ABDOMEN AND PELVIS WITH CONTRAST TECHNIQUE: Multidetector CT imaging of the abdomen and pelvis was performed using the standard protocol following bolus administration of intravenous contrast. CONTRAST:  170m ISOVUE-300 IOPAMIDOL (ISOVUE-300) INJECTION 61% COMPARISON:  06/13/2017 and 07/17/2012 FINDINGS: Lower chest: Lung  bases are unremarkable with minimal linear atelectasis/scarring. Mild stable cardiomegaly. Hepatobiliary: Previous cholecystectomy. Liver and biliary tree are normal. Pancreas: Normal. Spleen: Normal. Adrenals/Urinary Tract: Adrenal glands are normal. Kidneys are normal in size without hydronephrosis or nephrolithiasis. Ureters and bladder are normal. Stomach/Bowel: Stomach is normal. There are a few fluid-filled minimally dilated small bowel loops over the lower abdomen unchanged and clustered mostly over the midline lower abdomen/pelvis and unchanged from the previous exam. Appendix is normal. Air is present throughout the colon. The descending colon is somewhat decompressed. Vascular/Lymphatic: Normal. Reproductive: Unremarkable. Other: No significant free fluid or focal  inflammatory change. Postsurgical changes along the midline anterior lower abdominal wall. Musculoskeletal: Minimal degenerate change of the spine and hips. IMPRESSION: Several air and fluid-filled mildly dilated small bowel loops mostly clustered over the midline lower abdomen/pelvis unchanged from the previous exam likely a stable chronic finding. Recurrent early/partial small bowel obstructive process is possible. Electronically Signed   By: Marin Olp M.D.   On: 08/25/2018 07:31   Dg Abdomen Acute W/chest  Result Date: 08/25/2018 CLINICAL DATA:  Acute onset of generalized abdominal spasms, nausea and vomiting. EXAM: DG ABDOMEN ACUTE W/ 1V CHEST COMPARISON:  Chest radiograph performed 01/05/2016, and abdominal radiograph performed 06/14/2017 FINDINGS: There is elevation of the right hemidiaphragm. There is no evidence of focal opacification, pleural effusion or pneumothorax. The cardiomediastinal silhouette is borderline normal in size. The visualized bowel gas pattern is unremarkable. Scattered stool and air are seen within the colon; there is no evidence of small bowel dilatation to suggest obstruction. No free intra-abdominal air is  identified on the provided upright view. Clips are noted within the right upper quadrant, reflecting prior cholecystectomy. No acute osseous abnormalities are seen; the sacroiliac joints are unremarkable in appearance. IMPRESSION: 1. Unremarkable bowel gas pattern; no free intra-abdominal air seen. Small to moderate amount of stool noted in the colon. 2. No acute cardiopulmonary process seen. Electronically Signed   By: Garald Balding M.D.   On: 08/25/2018 05:39    Review of Systems  Constitutional: Negative for chills and fever.  HENT: Negative for hearing loss.   Eyes: Negative for blurred vision.  Respiratory: Negative for cough and shortness of breath.   Cardiovascular: Negative for chest pain and palpitations.  Gastrointestinal: Positive for abdominal pain, nausea and vomiting.  Genitourinary: Negative for dysuria and urgency.   Blood pressure (!) 152/86, pulse 81, temperature 98.5 F (36.9 C), temperature source Oral, resp. rate 17, SpO2 95 %. Physical Exam  Constitutional: She is oriented to person, place, and time. She appears well-developed and well-nourished.  HENT:  Head: Normocephalic and atraumatic.  Eyes: Pupils are equal, round, and reactive to light. Conjunctivae and EOM are normal.  Neck: Normal range of motion. Neck supple.  Cardiovascular: Normal rate and regular rhythm.  Respiratory: Effort normal. No respiratory distress.  GI: Soft. She exhibits distension. There is no tenderness.  Musculoskeletal: Normal range of motion.  Neurological: She is alert and oriented to person, place, and time.  Skin: Skin is warm and dry.    Assessment/Plan: 62 y.o. F with recurrent SBO.  Place NG tube.  SBO protocol started.  Pt would be a poor surgical candidate due to her h/o multiple abd surgeries.    Boots Mc[place]own C. 57/01/9354, 21:74 AM

## 2018-08-25 NOTE — H&P (Signed)
History and Physical    [name]:[id number] DOB: Apr 05, 1956 DOA: 08/25/2018  PCP: [name], MD   Patient coming from: Home  Chief Complaint: Abdominal pain  HPI: Bianca Campbell is a 62 y.o. female with medical history significant of hypertension, multiple abdominal surgeries,previous bowel obstruction who presents to the emergency department from home with complaints of upper abdominal pain that he started last midnight.  Patient also reported of associated  nausea and vomiting.   CT was obtained in the emergency department which showed several air and fluid-filled mildly dilated small bowel loops suggesting early/partial small bowel obstructive process . Patient seen and examined the bedside in the emergency department.  She denies any chest pain, shortness of breath, palpitations, dysuria, diarrhea, fever, chills. When she presented she had moderate tenderness on her upper abdomen at the epigastrium and right lower quadrant.Her last bowel movement was around 3 am .  She has not been passing gas.  ED Course: CT obtained and finding as above.  General surgery consulted.  Planned for admission for management of small bowel obstruction.   Past Medical History:  Diagnosis Date  . ALLERGIC RHINITIS 09/11/2007  . Anemia, unspecified 08/31/2012  . Arthritis    "knees" (11/23/2016)  . BACK PAIN   . EXTERNAL OTITIS 09/11/2007  . GERD 06/06/2007  . GOUT 11/14/2007  . High cholesterol   . HYPERTENSION 06/06/2007  . INTERMITTENT VERTIGO 09/11/2007  . Irritable bowel syndrome 06/06/2007  . LOW BACK PAIN 06/06/2007  . OSTEOARTHRITIS 06/06/2007  . PONV (postoperative nausea and vomiting)   . Primary localized osteoarthritis of right knee   . SHOULDER PAIN, RIGHT 12/30/2008    Past Surgical History:  Procedure Laterality Date  . BILATERAL SALPINGOOPHORECTOMY    . CESAREAN SECTION  1987, 1991  . COLONOSCOPY     pt stated 2015 or 2016 unknown md  . EXPLORATORY LAPAROTOMY     bowel obstruction  . INGUINAL HERNIA REPAIR Bilateral    numerous hernia repairs 1996 to 2011  . JOINT REPLACEMENT    . KNEE ARTHROSCOPY Bilateral 2013 - 11/11/2015  . LAPAROSCOPIC CHOLECYSTECTOMY  1991  . s/p incarcerated hernia and ovary cyst Jan 2011  2011  . TOTAL KNEE ARTHROPLASTY Right 11/21/2016   Procedure: RIGHT TOTAL KNEE ARTHROPLASTY;  Surgeon: Salvatore Marvel, MD;  Location: [facility] OR;  Service: Orthopedics;  Laterality: Right;  . UMBILICAL HERNIA REPAIR     numerous hernia repairs 1996 to 2011     reports that she has never smoked. She has never used smokeless tobacco. She reports that she does not drink alcohol or use drugs.  Allergies  Allergen Reactions  . Latex Itching    Family History  Problem Relation Age of Onset  . Arthritis Mother        RA  . Colon polyps Father 22  . Colon cancer Neg Hx      Prior to Admission medications   Medication Sig Start Date End Date Taking? Authorizing Provider  amLODipine (NORVASC) 10 MG tablet TAKE ONE TABLET BY MOUTH ONCE DAILY Patient taking differently: Take 10 mg by mouth daily.  05/09/17  Yes [name], MD  calcium carbonate (TUMS - DOSED IN MG ELEMENTAL CALCIUM) 500 MG chewable tablet Chew 1 tablet by mouth daily.   Yes [provider]  cetirizine (ZYRTEC) 10 MG tablet Take 1 tablet (10 mg total) by mouth daily. 06/20/18 06/20/19 Yes [name], MD  Cholecalciferol (VITAMIN D3) 5000 units CAPS Take  5,000 Units by mouth daily.    Yes [provider]  diclofenac (VOLTAREN) 75 MG EC tablet Take 75 mg by mouth daily as needed for mild pain.   Yes [provider]  dicyclomine (BENTYL) 20 MG tablet TAKE ONE TABLET BY MOUTH THREE TIMES DAILY BEFORE MEAL(S) Patient taking differently: Take 20 mg by mouth 3 (three) times daily before meals.  07/14/16  Yes [name], MD  irbesartan (AVAPRO) 300 MG tablet TAKE ONE TABLET BY MOUTH ONCE DAILY 05/09/17  Yes [name], MD  meclizine (ANTIVERT) 12.5 MG  tablet Take 1 tablet (12.5 mg total) by mouth 3 (three) times daily as needed for dizziness. 06/20/18 06/20/19 Yes [name], MD  meloxicam (MOBIC) 15 MG tablet Take 1 tablet (15 mg total) by mouth daily as needed for pain. 09/18/17  Yes [name], MD  methocarbamol (ROBAXIN) 500 MG tablet Take 500 mg by mouth every 6 (six) hours as needed for muscle spasms.  06/22/18  Yes [provider]  ondansetron (ZOFRAN) 4 MG tablet Take 1 tablet (4 mg total) by mouth every 8 (eight) hours as needed for nausea or vomiting. 06/20/18  Yes [name], MD  OVER THE COUNTER MEDICATION Take 1 tablet by mouth daily. Product name: Curamin (OTC organic pain reliever)   Yes [provider]  polyethylene glycol (MIRALAX / GLYCOLAX) packet 17grams in 6 oz of water twice a day until bowel movement.  LAXITIVE.  Restart if two days since last bowel movement 11/24/16  Yes Shepperson, Kirstin, PA-C  Diclofenac Sodium (PENNSAID) 2 % SOLN Place 1 application onto the skin 2 (two) times daily. Patient not taking: Reported on 08/25/2018 06/22/18   Myra Rude, MD  docusate sodium (COLACE) 100 MG capsule 1 tab 2 times a day while on narcotics.  STOOL SOFTENER Patient not taking: Reported on 08/25/2018 11/24/16   Shepperson, Kirstin, PA-C  hydrochlorothiazide (HYDRODIURIL) 25 MG tablet Take 1 tablet (25 mg total) by mouth daily. Patient not taking: Reported on 08/25/2018 06/20/18   [name], MD  triamcinolone (NASACORT) 55 MCG/ACT AERO nasal inhaler Place 2 sprays into the nose daily. Patient not taking: Reported on 08/25/2018 06/20/18   [name], MD    Physical Exam: Vitals:   08/25/18 0600 08/25/18 0630 08/25/18 0700 08/25/18 0742  BP: 116/70 138/82 110/72 106/65  Pulse: 86 88 82 78  Resp: 15 16    Temp:      TempSrc:      SpO2: 98% 99% 98% 99%    Constitutional: Not in distress,obese Vitals:   08/25/18 0600 08/25/18 0630 08/25/18 0700 08/25/18 0742  BP: 116/70 138/82 110/72 106/65  Pulse: 86 88  82 78  Resp: 15 16    Temp:      TempSrc:      SpO2: 98% 99% 98% 99%   Eyes: PERRL, lids and conjunctivae normal ENMT: Mucous membranes are moist. Posterior pharynx clear of any exudate or lesions.Normal dentition.  Neck: normal, supple, no masses, no thyromegaly Respiratory: clear to auscultation bilaterally, no wheezing, no crackles. Normal respiratory effort. No accessory muscle use.  Cardiovascular: Regular rate and rhythm, no murmurs / rubs / gallops. No extremity edema. 2+ pedal pulses. No carotid bruits.  Abdomen: epigastric tenderness, no masses palpated. No hepatosplenomegaly.Sluggish  Bowel sounds .  Musculoskeletal: no clubbing / cyanosis. No joint deformity upper and lower extremities. Good ROM, no contractures. Normal muscle tone.  Skin: no rashes, lesions, ulcers. No induration  Neurologic: CN 2-12 grossly intact. Sensation intact, DTR normal. Strength 5/5 in all 4.  Psychiatric: Normal judgment and insight. Alert and oriented x 3. Normal mood.   Foley Catheter:None  Labs on Admission: I have personally reviewed following labs and imaging studies  CBC: Recent Labs  Lab 08/25/18 0241  WBC 7.3  HGB 12.8  HCT 41.3  MCV 85.2  PLT 285   Basic Metabolic Panel: Recent Labs  Lab 08/25/18 0241  NA 136  K 4.3  CL 102  CO2 23  GLUCOSE 154*  BUN 10  CREATININE 0.65  CALCIUM 9.5   GFR: CrCl cannot be calculated (Unknown ideal weight.). Liver Function Tests: Recent Labs  Lab 08/25/18 0241  AST 27  ALT 18  ALKPHOS 82  BILITOT 1.3*  PROT 8.5*  ALBUMIN 4.5   Recent Labs  Lab 08/25/18 0241  LIPASE 22   No results for input(s): AMMONIA in the last 168 hours. Coagulation Profile: No results for input(s): INR, PROTIME in the last 168 hours. Cardiac Enzymes: No results for input(s): CKTOTAL, CKMB, CKMBINDEX, TROPONINI in the last 168 hours. BNP (last 3 results) No results for input(s): PROBNP in the last 8760 hours. HbA1C: No results for input(s): HGBA1C  in the last 72 hours. CBG: No results for input(s): GLUCAP in the last 168 hours. Lipid Profile: No results for input(s): CHOL, HDL, LDLCALC, TRIG, CHOLHDL, LDLDIRECT in the last 72 hours. Thyroid Function Tests: No results for input(s): TSH, T4TOTAL, FREET4, T3FREE, THYROIDAB in the last 72 hours. Anemia Panel: No results for input(s): VITAMINB12, FOLATE, FERRITIN, TIBC, IRON, RETICCTPCT in the last 72 hours. Urine analysis:    Component Value Date/Time   COLORURINE STRAW (A) 08/25/2018 0241   APPEARANCEUR CLEAR 08/25/2018 0241   LABSPEC 1.015 08/25/2018 0241   PHURINE 7.0 08/25/2018 0241   GLUCOSEU NEGATIVE 08/25/2018 0241   GLUCOSEU NEGATIVE 06/20/2018 1101   HGBUR NEGATIVE 08/25/2018 0241   BILIRUBINUR NEGATIVE 08/25/2018 0241   KETONESUR 20 (A) 08/25/2018 0241   PROTEINUR NEGATIVE 08/25/2018 0241   UROBILINOGEN 0.2 06/20/2018 1101   NITRITE NEGATIVE 08/25/2018 0241   LEUKOCYTESUR NEGATIVE 08/25/2018 0241    Radiological Exams on Admission: Ct Abdomen Pelvis W Contrast  Result Date: 08/25/2018 CLINICAL DATA:  Abdominal pain, nausea and vomiting. EXAM: CT ABDOMEN AND PELVIS WITH CONTRAST TECHNIQUE: Multidetector CT imaging of the abdomen and pelvis was performed using the standard protocol following bolus administration of intravenous contrast. CONTRAST:  ISOVUE-300 IOPAMIDOL (ISOVUE-300) INJECTION 61% COMPARISON:  06/13/2017 and 07/17/2012 FINDINGS: Lower chest: Lung bases are unremarkable with minimal linear atelectasis/scarring. Mild stable cardiomegaly. Hepatobiliary: Previous cholecystectomy. Liver and biliary tree are normal. Pancreas: Normal. Spleen: Normal. Adrenals/Urinary Tract: Adrenal glands are normal. Kidneys are normal in size without hydronephrosis or nephrolithiasis. Ureters and bladder are normal. Stomach/Bowel: Stomach is normal. There are a few fluid-filled minimally dilated small bowel loops over the lower abdomen unchanged and clustered mostly over the  midline lower abdomen/pelvis and unchanged from the previous exam. Appendix is normal. Air is present throughout the colon. The descending colon is somewhat decompressed. Vascular/Lymphatic: Normal. Reproductive: Unremarkable. Other: No significant free fluid or focal inflammatory change. Postsurgical changes along the midline anterior lower abdominal wall. Musculoskeletal: Minimal degenerate change of the spine and hips. IMPRESSION: Several air and fluid-filled mildly dilated small bowel loops mostly clustered over the midline lower abdomen/pelvis unchanged from the previous exam likely a stable chronic finding. Recurrent early/partial small bowel obstructive process is possible. Electronically Signed   By: Reuel Boom  Micheline Maze M.D.   On: 08/25/2018 07:31   Dg Abdomen Acute W/chest  Result Date: 08/25/2018 CLINICAL DATA:  Acute onset of generalized abdominal spasms, nausea and vomiting. EXAM: DG ABDOMEN ACUTE W/ 1V CHEST COMPARISON:  Chest radiograph performed 01/05/2016, and abdominal radiograph performed 06/14/2017 FINDINGS: There is elevation of the right hemidiaphragm. There is no evidence of focal opacification, pleural effusion or pneumothorax. The cardiomediastinal silhouette is borderline normal in size. The visualized bowel gas pattern is unremarkable. Scattered stool and air are seen within the colon; there is no evidence of small bowel dilatation to suggest obstruction. No free intra-abdominal air is identified on the provided upright view. Clips are noted within the right upper quadrant, reflecting prior cholecystectomy. No acute osseous abnormalities are seen; the sacroiliac joints are unremarkable in appearance. IMPRESSION: 1. Unremarkable bowel gas pattern; no free intra-abdominal air seen. Small to moderate amount of stool noted in the colon. 2. No acute cardiopulmonary process seen. Electronically Signed   By: Roanna Raider M.D.   On: 08/25/2018 05:39     Assessment/Plan Active Problems:    Essential hypertension  Small bowel obstruction: CT showed several air and fluid-filled mildly dilated small bowel loops mostly clustered over the midline lower suggesting  early/partial small bowel obstructive process.She has several abdominal surgeries in the past.  Most likely this is from the adhesions. General surgery consulted.  Plan for conservative management with NG tube insertion .  Keep n.p.o. .Continue IV fluids. Continue pain management, antiemetics.  Hypertension: Currently blood pressure stable.  Will use as needed meds for blood pressure as she is n.p.o.   Severity of Illness: The appropriate patient status for this patient is OBSERVATION.    DVT prophylaxis: Lovenox Code Status: Full Family Communication: None present at the bedside Consults called: General surgery     Burnadette Pop MD [facility] Pager [phone]  If 7PM-7AM, please contact night-coverage [email or web address] Password [email or web address]  08/25/2018, 10:08 AM

## 2018-08-25 NOTE — ED Triage Notes (Signed)
Pt c/o "spasm like" abdominal pain that started around midnight tonight, associated with nausea.

## 2018-08-25 NOTE — ED Notes (Signed)
Waiting on pt to return from restroom to complete triage.

## 2018-08-25 NOTE — ED Provider Notes (Signed)
07:30: Assumed care from Bianca Anis PA-C at change of shift pending CT abdomen/pelvis.   Please see prior provider note for full H&P.   Briefly patient is a 62 year old female with a hx of HTN, IBS, hypercholesterolemia, GERD prior SBO,  prior bilateral inguinal as well as umbilical hernia repair, and prior ex lab who presented to the ED with abdominal pain that started at midnight the night prior. Later developed nausea and vomiting. Has had 2 normal bowel movements since onset of sxs.    Physical Exam  BP 138/82   Pulse 88   Temp 98.5 F (36.9 C) (Oral)   Resp 16   SpO2 99%   Physical Exam  Constitutional: She appears well-developed and well-nourished. No distress.  HENT:  Head: Normocephalic and atraumatic.  Eyes: Conjunctivae are normal. Right eye exhibits no discharge. Left eye exhibits no discharge.  Abdominal: Bowel sounds are normal. There is tenderness (epigastric, periumbilical, RUQ/RLQ). There is no rigidity, no rebound and no guarding.  Neurological: She is alert.  Clear speech.   Psychiatric: She has a normal mood and affect. Her behavior is normal. Thought content normal.  Nursing note and vitals reviewed.   ED Course/Procedures     Procedures  Results for orders placed or performed during the hospital encounter of 08/25/18  Lipase, blood  Result Value Ref Range   Lipase 22 11 - 51 U/L  Comprehensive metabolic panel  Result Value Ref Range   Sodium 136 135 - 145 mmol/L   Potassium 4.3 3.5 - 5.1 mmol/L   Chloride 102 98 - 111 mmol/L   CO2 23 22 - 32 mmol/L   Glucose, Bld 154 (H) 70 - 99 mg/dL   BUN 10 8 - 23 mg/dL   Creatinine, Ser 1.61 0.44 - 1.00 mg/dL   Calcium 9.5 8.9 - 09.6 mg/dL   Total Protein 8.5 (H) 6.5 - 8.1 g/dL   Albumin 4.5 3.5 - 5.0 g/dL   AST 27 15 - 41 U/L   ALT 18 0 - 44 U/L   Alkaline Phosphatase 82 38 - 126 U/L   Total Bilirubin 1.3 (H) 0.3 - 1.2 mg/dL   GFR calc non Af Amer >60 >60 mL/min   GFR calc Af Amer >60 >60 mL/min    Anion gap 11 5 - 15  CBC  Result Value Ref Range   WBC 7.3 4.0 - 10.5 K/uL   RBC 4.85 3.87 - 5.11 MIL/uL   Hemoglobin 12.8 12.0 - 15.0 g/dL   HCT 04.5 40.9 - 81.1 %   MCV 85.2 80.0 - 100.0 fL   MCH 26.4 26.0 - 34.0 pg   MCHC 31.0 30.0 - 36.0 g/dL   RDW 91.4 78.2 - 95.6 %   Platelets 285 150 - 400 K/uL   nRBC 0.0 0.0 - 0.2 %  Urinalysis, Routine w reflex microscopic  Result Value Ref Range   Color, Urine STRAW (A) YELLOW   APPearance CLEAR CLEAR   Specific Gravity, Urine 1.015 1.005 - 1.030   pH 7.0 5.0 - 8.0   Glucose, UA NEGATIVE NEGATIVE mg/dL   Hgb urine dipstick NEGATIVE NEGATIVE   Bilirubin Urine NEGATIVE NEGATIVE   Ketones, ur 20 (A) NEGATIVE mg/dL   Protein, ur NEGATIVE NEGATIVE mg/dL   Nitrite NEGATIVE NEGATIVE   Leukocytes, UA NEGATIVE NEGATIVE   Mr Brain Wo Contrast  Result Date: 08/13/2018 CLINICAL DATA:  Vertigo, persistent, central. EXAM: MRI HEAD WITHOUT CONTRAST TECHNIQUE: Multiplanar, multiecho pulse sequences of the brain and  surrounding structures were obtained without intravenous contrast. COMPARISON:  None. FINDINGS: Brain: The diffusion-weighted images demonstrate no acute or subacute infarction. No acute hemorrhage or mass lesion is present. Scattered subcortical T2 changes are mildly advanced for age. The ventricles are proportionate to the degree of atrophy. No significant extra-axial fluid collection is present. A remote left paramedian pontine infarct is present. White matter changes are present within the brainstem. The cerebellum is within normal limits. Vascular: Flow is present in the major intracranial arteries. Skull and upper cervical spine: The skull base is within normal limits. There is straightening and reversal of the normal cervical lordosis at C4-5. There is reversal of the normal cervical lordosis. Central canal stenosis is present at C3-4. Sinuses/Orbits: The left maxillary sinus is chronically occluded. The remaining paranasal sinuses and the  mastoid air cells are clear. Globes and orbits are within normal limits bilaterally. IMPRESSION: 1. Scattered subcortical white matter changes are mildly advanced for age. The finding is nonspecific but can be seen in the setting of chronic microvascular ischemia, a demyelinating process such as multiple sclerosis, vasculitis, complicated migraine headaches, or as the sequelae of a prior infectious or inflammatory process. 2. Remote left paramedian pontine nonhemorrhagic infarct. 3. Straightening and reversal of normal cervical lordosis. 4. Focal central canal stenosis at C3-4. 5. Chronic left maxillary sinus occlusion. Electronically Signed   By: Marin Roberts M.D.   On: 08/13/2018 12:54   Ct Abdomen Pelvis W Contrast  Result Date: 08/25/2018 CLINICAL DATA:  Abdominal pain, nausea and vomiting. EXAM: CT ABDOMEN AND PELVIS WITH CONTRAST TECHNIQUE: Multidetector CT imaging of the abdomen and pelvis was performed using the standard protocol following bolus administration of intravenous contrast. CONTRAST:  ISOVUE-300 IOPAMIDOL (ISOVUE-300) INJECTION 61% COMPARISON:  06/13/2017 and 07/17/2012 FINDINGS: Lower chest: Lung bases are unremarkable with minimal linear atelectasis/scarring. Mild stable cardiomegaly. Hepatobiliary: Previous cholecystectomy. Liver and biliary tree are normal. Pancreas: Normal. Spleen: Normal. Adrenals/Urinary Tract: Adrenal glands are normal. Kidneys are normal in size without hydronephrosis or nephrolithiasis. Ureters and bladder are normal. Stomach/Bowel: Stomach is normal. There are a few fluid-filled minimally dilated small bowel loops over the lower abdomen unchanged and clustered mostly over the midline lower abdomen/pelvis and unchanged from the previous exam. Appendix is normal. Air is present throughout the colon. The descending colon is somewhat decompressed. Vascular/Lymphatic: Normal. Reproductive: Unremarkable. Other: No significant free fluid or focal inflammatory  change. Postsurgical changes along the midline anterior lower abdominal wall. Musculoskeletal: Minimal degenerate change of the spine and hips. IMPRESSION: Several air and fluid-filled mildly dilated small bowel loops mostly clustered over the midline lower abdomen/pelvis unchanged from the previous exam likely a stable chronic finding. Recurrent early/partial small bowel obstructive process is possible. Electronically Signed   By: Bianca Campbell M.D.   On: 08/25/2018 07:31   Dg Abdomen Acute W/chest  Result Date: 08/25/2018 CLINICAL DATA:  Acute onset of generalized abdominal spasms, nausea and vomiting. EXAM: DG ABDOMEN ACUTE W/ 1V CHEST COMPARISON:  Chest radiograph performed 01/05/2016, and abdominal radiograph performed 06/14/2017 FINDINGS: There is elevation of the right hemidiaphragm. There is no evidence of focal opacification, pleural effusion or pneumothorax. The cardiomediastinal silhouette is borderline normal in size. The visualized bowel gas pattern is unremarkable. Scattered stool and air are seen within the colon; there is no evidence of small bowel dilatation to suggest obstruction. No free intra-abdominal air is identified on the provided upright view. Clips are noted within the right upper quadrant, reflecting prior cholecystectomy. No acute osseous abnormalities are  seen; the sacroiliac joints are unremarkable in appearance. IMPRESSION: 1. Unremarkable bowel gas pattern; no free intra-abdominal air seen. Small to moderate amount of stool noted in the colon. 2. No acute cardiopulmonary process seen. Electronically Signed   By: Bianca Campbell M.D.   On: 08/25/2018 05:39    MDM   Patient presents to the emergency department with abdominal pain associated nausea and vomiting.  Patient has a history of several previous abdominal surgeries.  Her laboratory work-up is fairly unremarkable in the emergency department.  Her CT scan shows several air and fluid-filled mildly dilated small bowel loops  mostly clustered over the midline lower abdomen/pelvis unchanged from the previous exam likely a stable chronic finding. Recurrent early/partial small bowel obstructive process is possible.  Given patient is having increased pain that is not chronic in nature there is concern for possible recurrent early/partial SBO. Discussed with attending Dr. Preston Fleeting who has evaluated patient- plan for general surgery consultation and likely medical admission.   08:25: CONSULT: Discussed case with general surgeon Dr. Romie Levee- recommends providing patient option of admission w/ NG tube and SBO protocol vs. Discharge home with 48 hours of clear liquid diet.   08:40: RE-EVAL: Patient resting comfortably, no nausea, states pain is improved but remains present at a 4-5/10. Discussed options at length- she would like to discuss with her husband and then make a decision.   Patient would prefer admission. Consultation to hospitalist service placed. NG tube ordered per discussion with general surgery.   09:39: CONSULT: Discussed case with hospitalst Dr. Renford Dills who accepts admission.   09:57: CONSULT: Discussed case with Dr. Maisie Fus who is aware of patient admission and will consult.      Desmond Lope 08/25/18 1026    Dione Booze, MD 08/25/18 2236

## 2018-08-26 DIAGNOSIS — K56600 Partial intestinal obstruction, unspecified as to cause: Secondary | ICD-10-CM | POA: Diagnosis not present

## 2018-08-26 DIAGNOSIS — K56609 Unspecified intestinal obstruction, unspecified as to partial versus complete obstruction: Secondary | ICD-10-CM | POA: Diagnosis not present

## 2018-08-26 LAB — CBC
HCT: 37 % (ref 36.0–46.0)
Hemoglobin: 11.4 g/dL — ABNORMAL LOW (ref 12.0–15.0)
MCH: 26.8 pg (ref 26.0–34.0)
MCHC: 30.8 g/dL (ref 30.0–36.0)
MCV: 86.9 fL (ref 80.0–100.0)
PLATELETS: 259 10*3/uL (ref 150–400)
RBC: 4.26 MIL/uL (ref 3.87–5.11)
RDW: 13.8 % (ref 11.5–15.5)
WBC: 5.2 10*3/uL (ref 4.0–10.5)
nRBC: 0 % (ref 0.0–0.2)

## 2018-08-26 LAB — BASIC METABOLIC PANEL
Anion gap: 8 (ref 5–15)
BUN: 6 mg/dL — AB (ref 8–23)
CALCIUM: 8.4 mg/dL — AB (ref 8.9–10.3)
CO2: 24 mmol/L (ref 22–32)
CREATININE: 0.48 mg/dL (ref 0.44–1.00)
Chloride: 105 mmol/L (ref 98–111)
GFR calc Af Amer: >60 mL/min (ref >60)
GLUCOSE: 106 mg/dL — AB (ref 70–99)
Potassium: 3.2 mmol/L — ABNORMAL LOW (ref 3.5–5.1)
Sodium: 137 mmol/L (ref 135–145)

## 2018-08-26 LAB — HIV ANTIBODY (ROUTINE TESTING W REFLEX): HIV SCREEN 4TH GENERATION: NONREACTIVE

## 2018-08-26 MED ORDER — SALINE SPRAY 0.65 % NA SOLN
1.0000 | NASAL | Status: DC | PRN
  Filled 2018-08-26: qty 44

## 2018-08-26 MED ORDER — POTASSIUM CHLORIDE 10 MEQ/100ML IV SOLN
10.0000 meq | INTRAVENOUS | Status: AC
  Administered 2018-08-26 (×2): 10 meq via INTRAVENOUS
  Filled 2018-08-26 (×2): qty 100

## 2018-08-26 MED ORDER — ALUM & MAG HYDROXIDE-SIMETH 200-200-20 MG/5ML PO SUSP
15.0000 mL | Freq: Four times a day (QID) | ORAL | Status: DC | PRN
  Administered 2018-08-26: 15 mL via ORAL
  Filled 2018-08-26: qty 30

## 2018-08-26 MED ORDER — MAGNESIUM SULFATE 2 GM/50ML IV SOLN
2.0000 g | Freq: Once | INTRAVENOUS | Status: AC
  Administered 2018-08-26: 2 g via INTRAVENOUS
  Filled 2018-08-26: qty 50

## 2018-08-26 MED ORDER — POTASSIUM CHLORIDE 10 MEQ/100ML IV SOLN
10.0000 meq | INTRAVENOUS | Status: AC
  Administered 2018-08-26 (×2): 10 meq via INTRAVENOUS
  Filled 2018-08-26: qty 100

## 2018-08-26 NOTE — Progress Notes (Signed)
SBO (small bowel obstruction) (HCC)  Subjective: Passing flatus, pain is better, had a BM  Objective: Vital signs in last 24 hours: Temp:  [97.6 F (36.4 C)-99.1 F (37.3 C)] 98.8 F (37.1 C) (11/10 0528) Pulse Rate:  [74-89] 89 (11/10 0528) Resp:  [16-17] 16 (11/10 0528) BP: (106-152)/(57-91) 152/84 (11/10 0528) SpO2:  [92 %-99 %] 92 % (11/10 0528) Last BM Date: 08/25/18  Intake/Output from previous day: 11/09 0701 - 11/10 0700 In: 1098.7 [I.V.:1078.7; NG/GT:20] Out: 950 [Urine:300; Emesis/NG output:650] Intake/Output this shift: Total I/O In: -  Out: 400 [Urine:300; Emesis/NG output:100]  General appearance: alert and cooperative GI: normal findings: soft, non-tender  Lab Results:  Results for orders placed or performed during the hospital encounter of 08/25/18 (from the past 24 hour(s))  Basic metabolic panel     Status: Abnormal   Collection Time: 08/26/18  5:07 AM  Result Value Ref Range   Sodium 137 135 - 145 mmol/L   Potassium 3.2 (L) 3.5 - 5.1 mmol/L   Chloride 105 98 - 111 mmol/L   CO2 24 22 - 32 mmol/L   Glucose, Bld 106 (H) 70 - 99 mg/dL   BUN 6 (L) 8 - 23 mg/dL   Creatinine, Ser 1.61 0.44 - 1.00 mg/dL   Calcium 8.4 (L) 8.9 - 10.3 mg/dL   GFR calc non Af Amer >60 >60 mL/min   GFR calc Af Amer >60 >60 mL/min   Anion gap 8 5 - 15  CBC     Status: Abnormal   Collection Time: 08/26/18  5:07 AM  Result Value Ref Range   WBC 5.2 4.0 - 10.5 K/uL   RBC 4.26 3.87 - 5.11 MIL/uL   Hemoglobin 11.4 (L) 12.0 - 15.0 g/dL   HCT 09.6 04.5 - 40.9 %   MCV 86.9 80.0 - 100.0 fL   MCH 26.8 26.0 - 34.0 pg   MCHC 30.8 30.0 - 36.0 g/dL   RDW 81.1 91.4 - 78.2 %   Platelets 259 150 - 400 K/uL   nRBC 0.0 0.0 - 0.2 %     Studies/Results Radiology     MEDS, Scheduled . enoxaparin (LOVENOX) injection  40 mg Subcutaneous Daily  . Influenza vac split quadrivalent PF  0.5 mL Intramuscular Tomorrow-1000     Assessment: SBO (small bowel obstruction) (HCC) AXR shows  contrast in the colon.    Plan: D/C NG and start clears.  Advance diet as tolerated.  Should be ready for d/c in AM    LOS: 0 days    Vanita Panda, MD [facility], [place] [phone]   08/26/2018 7:41 AM

## 2018-08-26 NOTE — Progress Notes (Signed)
PROGRESS NOTE    Bianca Campbell  ZOX:[id number] DOB: 1956/01/05 DOA: 08/25/2018 PCP: [name], MD    Brief Narrative 62 y.o. female with medical history significant of hypertension, multiple abdominal surgeries,previous bowel obstruction who presents to the emergency department from home with complaints of upper abdominal pain that he started last midnight.  Patient also reported of associated  nausea and vomiting.   CT was obtained in the emergency department which showed several air and fluid-filled mildly dilated small bowel loops suggesting early/partial small bowel obstructive process . Patient seen and examined the bedside in the emergency department.  She denies any chest pain, shortness of breath, palpitations, dysuria, diarrhea, fever, chills. When she presented she had moderate tenderness on her upper abdomen at the epigastrium and right lower quadrant.Her last bowel movement was around 3 am .  She has not been passing gas.  ED Course: CT obtained and finding as above.  General surgery consulted.  Planned for admission for management of small bowel obstruction.   Assessment & Plan:   Principal Problem:   SBO (small bowel obstruction) (HCC) Active Problems:   Essential hypertension   1]recurrent sbo better passing flatus.one bm overnight.ambulating in room.appreciate surgery input.clears today  2]hypokalemia replete   3]hypertension-restart home meds  Estimated body mass index is 44.09 kg/m as calculated from the following:   Height as of 06/22/18: 4' 11.5" (1.511 m).   Weight as of 06/22/18: 100.7 kg.  DVT prophylaxis: lovenox Code Status: full Family Communication none  Disposition Plan Pending clinical improvement  Consultants: surgery  Procedures:none Antimicrobials:none Subjective: Feels better passing gas   Objective: Vitals:   08/25/18 1100 08/25/18 1155 08/25/18 2102 08/26/18 0528  BP: (!) 142/91 133/78 137/83 (!) 152/84  Pulse: 81 78 82 89    Resp:  16 16 16   Temp:  97.6 F (36.4 C) 99.1 F (37.3 C) 98.8 F (37.1 C)  TempSrc:  Oral Oral Oral  SpO2: 99% 97% 96% 92%    Intake/Output Summary (Last 24 hours) at 08/26/2018 0907 Last data filed at 08/26/2018 8119 Gross per 24 hour  Intake 1098.7 ml  Output 1350 ml  Net -251.3 ml   There were no vitals filed for this visit.  Examination:  General exam: Appears calm and comfortable  Respiratory system: Clear to auscultation. Respiratory effort normal. Cardiovascular system: S1 & S2 heard, RRR. No JVD, murmurs, rubs, gallops or clicks. No pedal edema. Gastrointestinal system: Abdomen is nondistended, soft and nontender. No organomegaly or masses felt. High ptched bowel sounds heard. Central nervous system: Alert and oriented. No focal neurological deficits. Extremities: Symmetric 5 x 5 power. Skin: No rashes, lesions or ulcers Psychiatry: Judgement and insight appear normal. Mood & affect appropriate.     Data Reviewed: I have personally reviewed following labs and imaging studies  CBC: Recent Labs  Lab 08/25/18 0241 08/26/18 0507  WBC 7.3 5.2  HGB 12.8 11.4*  HCT 41.3 37.0  MCV 85.2 86.9  PLT 285 259   Basic Metabolic Panel: Recent Labs  Lab 08/25/18 0241 08/26/18 0507  NA 136 137  K 4.3 3.2*  CL 102 105  CO2 23 24  GLUCOSE 154* 106*  BUN 10 6*  CREATININE 0.65 0.48  CALCIUM 9.5 8.4*   GFR: CrCl cannot be calculated (Unknown ideal weight.). Liver Function Tests: Recent Labs  Lab 08/25/18 0241  AST 27  ALT 18  ALKPHOS 82  BILITOT 1.3*  PROT 8.5*  ALBUMIN 4.5   Recent Labs  Lab  08/25/18 0241  LIPASE 22   No results for input(s): AMMONIA in the last 168 hours. Coagulation Profile: No results for input(s): INR, PROTIME in the last 168 hours. Cardiac Enzymes: No results for input(s): CKTOTAL, CKMB, CKMBINDEX, TROPONINI in the last 168 hours. BNP (last 3 results) No results for input(s): PROBNP in the last 8760 hours. HbA1C: No  results for input(s): HGBA1C in the last 72 hours. CBG: No results for input(s): GLUCAP in the last 168 hours. Lipid Profile: No results for input(s): CHOL, HDL, LDLCALC, TRIG, CHOLHDL, LDLDIRECT in the last 72 hours. Thyroid Function Tests: No results for input(s): TSH, T4TOTAL, FREET4, T3FREE, THYROIDAB in the last 72 hours. Anemia Panel: No results for input(s): VITAMINB12, FOLATE, FERRITIN, TIBC, IRON, RETICCTPCT in the last 72 hours. Sepsis Labs: No results for input(s): PROCALCITON, LATICACIDVEN in the last 168 hours.  No results found for this or any previous visit (from the past 240 hour(s)).       Radiology Studies: Ct Abdomen Pelvis W Contrast  Result Date: 08/25/2018 CLINICAL DATA:  Abdominal pain, nausea and vomiting. EXAM: CT ABDOMEN AND PELVIS WITH CONTRAST TECHNIQUE: Multidetector CT imaging of the abdomen and pelvis was performed using the standard protocol following bolus administration of intravenous contrast. CONTRAST:  ISOVUE-300 IOPAMIDOL (ISOVUE-300) INJECTION 61% COMPARISON:  06/13/2017 and 07/17/2012 FINDINGS: Lower chest: Lung bases are unremarkable with minimal linear atelectasis/scarring. Mild stable cardiomegaly. Hepatobiliary: Previous cholecystectomy. Liver and biliary tree are normal. Pancreas: Normal. Spleen: Normal. Adrenals/Urinary Tract: Adrenal glands are normal. Kidneys are normal in size without hydronephrosis or nephrolithiasis. Ureters and bladder are normal. Stomach/Bowel: Stomach is normal. There are a few fluid-filled minimally dilated small bowel loops over the lower abdomen unchanged and clustered mostly over the midline lower abdomen/pelvis and unchanged from the previous exam. Appendix is normal. Air is present throughout the colon. The descending colon is somewhat decompressed. Vascular/Lymphatic: Normal. Reproductive: Unremarkable. Other: No significant free fluid or focal inflammatory change. Postsurgical changes along the midline anterior  lower abdominal wall. Musculoskeletal: Minimal degenerate change of the spine and hips. IMPRESSION: Several air and fluid-filled mildly dilated small bowel loops mostly clustered over the midline lower abdomen/pelvis unchanged from the previous exam likely a stable chronic finding. Recurrent early/partial small bowel obstructive process is possible. Electronically Signed   By: Elberta Fortis M.D.   On: 08/25/2018 07:31   Dg Abdomen Acute W/chest  Result Date: 08/25/2018 CLINICAL DATA:  Acute onset of generalized abdominal spasms, nausea and vomiting. EXAM: DG ABDOMEN ACUTE W/ 1V CHEST COMPARISON:  Chest radiograph performed 01/05/2016, and abdominal radiograph performed 06/14/2017 FINDINGS: There is elevation of the right hemidiaphragm. There is no evidence of focal opacification, pleural effusion or pneumothorax. The cardiomediastinal silhouette is borderline normal in size. The visualized bowel gas pattern is unremarkable. Scattered stool and air are seen within the colon; there is no evidence of small bowel dilatation to suggest obstruction. No free intra-abdominal air is identified on the provided upright view. Clips are noted within the right upper quadrant, reflecting prior cholecystectomy. No acute osseous abnormalities are seen; the sacroiliac joints are unremarkable in appearance. IMPRESSION: 1. Unremarkable bowel gas pattern; no free intra-abdominal air seen. Small to moderate amount of stool noted in the colon. 2. No acute cardiopulmonary process seen. Electronically Signed   By: Roanna Raider M.D.   On: 08/25/2018 05:39   Dg Abd Portable 1v-small Bowel Obstruction Protocol-initial, 8 Hr Delay  Result Date: 08/25/2018 CLINICAL DATA:  Small-bowel obstruction protocol, 8 hour delayed radiograph. EXAM:  PORTABLE ABDOMEN - 1 VIEW COMPARISON:  Abdominal radiograph performed earlier today at 11:06 a.m. FINDINGS: The patient's enteric tube is noted ending overlying the antrum of the stomach. Contrast has  progressed to the level of the colon, without evidence for bowel obstruction. The visualized bowel gas pattern is grossly unremarkable. No free intra-abdominal air is seen, though evaluation for free air is limited on supine views. The visualized osseous structures are within normal limits; the sacroiliac joints are unremarkable in appearance. IMPRESSION: Enteric tube noted ending overlying the antrum of the stomach. Contrast has progressed to the level of the colon, without evidence for bowel obstruction. Electronically Signed   By: Roanna Raider M.D.   On: 08/25/2018 22:24   Dg Abd Portable 1v-small Bowel Protocol-position Verification  Result Date: 08/25/2018 CLINICAL DATA:  Pt c/o "spasm like" abdominal pain that started around midnight last night associated with nausea. Pt reported previous abdominal surgeries. EXAM: PORTABLE ABDOMEN - 1 VIEW COMPARISON:  CT, 08/25/2018 at 7:07 a.m. FINDINGS: Normal bowel gas pattern. No radiographic findings of obstruction on this single supine image. There is residual contrast in nondilated intrarenal collecting systems, and within the bladder. Nasogastric tube extends into the central abdomen consistent with location in the mid to distal stomach. Clips are upper quadrant reflect a prior cholecystectomy. No other soft tissue abnormality. IMPRESSION: 1. No acute findings. No radiographic evidence of bowel obstruction. Electronically Signed   By: Amie Portland M.D.   On: 08/25/2018 11:41        Scheduled Meds: . enoxaparin (LOVENOX) injection  40 mg Subcutaneous Daily  . Influenza vac split quadrivalent PF  0.5 mL Intramuscular Tomorrow-1000   Continuous Infusions: . sodium chloride 100 mL/hr at 08/25/18 2127  . magnesium sulfate 1 - 4 g bolus IVPB    . potassium chloride       LOS: 0 days     Alwyn Ren, MD [facility]  If 7PM-7AM, please contact night-coverage [email or web address] Password [email or web address] 08/26/2018, 9:07 AM

## 2018-08-27 DIAGNOSIS — K56609 Unspecified intestinal obstruction, unspecified as to partial versus complete obstruction: Secondary | ICD-10-CM | POA: Diagnosis not present

## 2018-08-27 DIAGNOSIS — I10 Essential (primary) hypertension: Secondary | ICD-10-CM

## 2018-08-27 DIAGNOSIS — K56600 Partial intestinal obstruction, unspecified as to cause: Secondary | ICD-10-CM | POA: Diagnosis not present

## 2018-08-27 NOTE — Progress Notes (Signed)
[facility] Progress Note     Subjective: CC:  Still with mild, intermittent "sqeezing" abdominal pain, sometimes exacerbated by movement. Denies nausea or vomiting. +flatus. One very small, formed stool this AM.  Objective: Vital signs in last 24 hours: Temp:  [98.3 F (36.8 C)-98.5 F (36.9 C)] 98.3 F (36.8 C) (11/11 0517) Pulse Rate:  [69-79] 69 (11/11 0517) Resp:  [16-18] 18 (11/11 0517) BP: (109-126)/(65-77) 126/77 (11/11 0517) SpO2:  [94 %-97 %] 94 % (11/11 0517) Last BM Date: 08/26/18  Intake/Output from previous day: 11/10 0701 - 11/11 0700 In: 3490 [P.O.:480; I.V.:3010] Out: 2000 [Urine:1900; Emesis/NG output:100] Intake/Output this shift: No intake/output data recorded.  PE: Gen:  Alert, NAD, pleasant Card:  Regular rate and rhythm, pedal pulses 2+ BL Pulm:  Normal effort, clear to auscultation bilaterally Abd: Soft, non-tender, mid distention, +BS  Skin: warm and dry, no rashes  Psych: A&Ox3   Lab Results:  Recent Labs    08/25/18 0241 08/26/18 0507  WBC 7.3 5.2  HGB 12.8 11.4*  HCT 41.3 37.0  PLT 285 259   BMET Recent Labs    08/25/18 0241 08/26/18 0507  NA 136 137  K 4.3 3.2*  CL 102 105  CO2 23 24  GLUCOSE 154* 106*  BUN 10 6*  CREATININE 0.65 0.48  CALCIUM 9.5 8.4*   PT/INR No results for input(s): LABPROT, INR in the last 72 hours. CMP     Component Value Date/Time   NA 137 08/26/2018 0507   K 3.2 (L) 08/26/2018 0507   CL 105 08/26/2018 0507   CO2 24 08/26/2018 0507   GLUCOSE 106 (H) 08/26/2018 0507   BUN 6 (L) 08/26/2018 0507   CREATININE 0.48 08/26/2018 0507   CALCIUM 8.4 (L) 08/26/2018 0507   CALCIUM 9.7 06/24/2011 0952   PROT 8.5 (H) 08/25/2018 0241   ALBUMIN 4.5 08/25/2018 0241   AST 27 08/25/2018 0241   ALT 18 08/25/2018 0241   ALKPHOS 82 08/25/2018 0241   BILITOT 1.3 (H) 08/25/2018 0241   GFRNONAA >60 08/26/2018 0507   GFRAA >60 08/26/2018 0507   Lipase     Component Value Date/Time   LIPASE 22  08/25/2018 0241       Studies/Results: Dg Abd Portable 1v-small Bowel Obstruction Protocol-initial, 8 Hr Delay  Result Date: 08/25/2018 CLINICAL DATA:  Small-bowel obstruction protocol, 8 hour delayed radiograph. EXAM: PORTABLE ABDOMEN - 1 VIEW COMPARISON:  Abdominal radiograph performed earlier today at 11:06 a.m. FINDINGS: The patient's enteric tube is noted ending overlying the antrum of the stomach. Contrast has progressed to the level of the colon, without evidence for bowel obstruction. The visualized bowel gas pattern is grossly unremarkable. No free intra-abdominal air is seen, though evaluation for free air is limited on supine views. The visualized osseous structures are within normal limits; the sacroiliac joints are unremarkable in appearance. IMPRESSION: Enteric tube noted ending overlying the antrum of the stomach. Contrast has progressed to the level of the colon, without evidence for bowel obstruction. Electronically Signed   By: Roanna Raider M.D.   On: 08/25/2018 22:24   Dg Abd Portable 1v-small Bowel Protocol-position Verification  Result Date: 08/25/2018 CLINICAL DATA:  Pt c/o "spasm like" abdominal pain that started around midnight last night associated with nausea. Pt reported previous abdominal surgeries. EXAM: PORTABLE ABDOMEN - 1 VIEW COMPARISON:  CT, 08/25/2018 at 7:07 a.m. FINDINGS: Normal bowel gas pattern. No radiographic findings of obstruction on this single supine image. There is residual contrast in nondilated  intrarenal collecting systems, and within the bladder. Nasogastric tube extends into the central abdomen consistent with location in the mid to distal stomach. Clips are upper quadrant reflect a prior cholecystectomy. No other soft tissue abnormality. IMPRESSION: 1. No acute findings. No radiographic evidence of bowel obstruction. Electronically Signed   By: Amie Portland M.D.   On: 08/25/2018 11:41    Anti-infectives: Anti-infectives (From admission, onward)    None     Assessment/Plan SBO  - PMH mult abd surgeries - NG removed 11/10, tolerating clears, advance to fulls and then SOFT at dinner if tolerates - recommend discharge home tomorrow AM if patient abd discomfort continued to improve and she is tolerating SOFT diet. - OOB and mobilize 4-5 times daily - we discussed maintaining a low residue diet for 7-10 days and then transitioning back to a high fiber diet. Patient encouraged to stay hydrated and chew her food very well.   FEN - fulls, SOFT at dinner ID - none VTE - SCD;s, lovenox    LOS: 0 days    Hosie Spangle, [facility] Pager: [phone]

## 2018-08-27 NOTE — Progress Notes (Signed)
  PROGRESS NOTE  Bianca Campbell ZOX:[id number] DOB: Jul 30, 1956 DOA: 08/25/2018 PCP: [name], MD  Brief Narrative: 62 year old woman PMH multiple abdominal surgeries, previous bowel obstruction, presented with abdominal pain.  Admitted for small bowel obstruction 11/9.  Seen by general surgery.  11/10 NG tube was removed and diet was advanced.  Assessment/Plan SBO.  PMH bilateral inguinal, as well as umbilical hernia repair. --On liquid diet per general surgery.  No NG tube present. --Diet and further management per general surgery.  Essential hypertension --Stable.  Resume Avapro on discharge.   Seems to be improved.  Further recommendations per general surgery, should be able to go home as soon as cleared by general surgery, no active medical issues.  Patient trying to make a lifestyle change in her diet, consult dictation for further recommendations.  DVT prophylaxis: enoxaparin  Code Status: full Family Communication: none Disposition Plan: home    Brendia Sacks, MD  [facility] Direct contact: [phone] --Via amion app OR  --[email or web address]; password [email or web address]  7PM-7AM contact night coverage as above 08/27/2018, 9:06 AM  LOS: 0 days   Consultants:  General surgery   Procedures:    Antimicrobials:    Interval history/Subjective: Overall feels better but still has some general abdominal discomfort.  Tolerating liquids.  No nausea or vomiting.  Bowel movement reported.  Objective: Vitals:  Vitals:   08/26/18 2124 08/27/18 0517  BP: 123/74 126/77  Pulse: 78 69  Resp: 18 18  Temp: 98.5 F (36.9 C) 98.3 F (36.8 C)  SpO2: 95% 94%    Exam:  Constitutional:  . Appears calm and comfortable Respiratory:  . CTA bilaterally, no w/r/r.  . Respiratory effort normal.  Cardiovascular:  . RRR, no m/r/g . No LE extremity edema   Abdomen:  . Soft, nondistended, mild generalized tenderness Psychiatric:  . Mental status o Mood, affect  appropriate  I have personally reviewed the following:   Data: . Labs 11/10 potassium 3.2, remainder BMP unremarkable.  CBC unremarkable.  Scheduled Meds: . enoxaparin (LOVENOX) injection  40 mg Subcutaneous Daily  . Influenza vac split quadrivalent PF  0.5 mL Intramuscular Tomorrow-1000   Continuous Infusions:   Principal Problem:   SBO (small bowel obstruction) (HCC) Active Problems:   Essential hypertension   LOS: 0 days

## 2018-08-28 ENCOUNTER — Telehealth: Payer: Self-pay | Admitting: *Deleted

## 2018-08-28 DIAGNOSIS — K56609 Unspecified intestinal obstruction, unspecified as to partial versus complete obstruction: Secondary | ICD-10-CM | POA: Diagnosis not present

## 2018-08-28 DIAGNOSIS — I10 Essential (primary) hypertension: Secondary | ICD-10-CM | POA: Diagnosis not present

## 2018-08-28 DIAGNOSIS — K56600 Partial intestinal obstruction, unspecified as to cause: Secondary | ICD-10-CM | POA: Diagnosis not present

## 2018-08-28 NOTE — Telephone Encounter (Signed)
Pt was on TCM report admitted 08/25/18 for small bowel obstruction. On 11/10 NG tube was removed and diet was advanced. Pt D/C 06/28/18, and will follow-up w/surgeon. Does not need TCM appt w/PCP.Marland KitchenRaechel Chute

## 2018-08-28 NOTE — Progress Notes (Addendum)
[name]  BP IS 151/106. She didn't receive her home meds while here. I have spoken to Dr Irene LimboGoodrich . H e is aware of her BP and is aware that she will take  Her BP  meds when she gets home.   Ms Bianca Campbell was D/C home with her husband via a W/C. Marland Kitchen. She verbalized understanding of D/C instructions

## 2018-08-28 NOTE — Discharge Summary (Signed)
Physician Discharge Summary  Bianca Campbell GNF:[id number] DOB: 12/09/1955 DOA: 08/25/2018  PCP: [name], MD  Admit date: 08/25/2018 Discharge date: 08/28/2018  Recommendations for Outpatient Follow-up:  Maintain a low residue diet for 7-10 days and then transition back to a high fiber diet. Patient encouraged to stay hydrated and chew food very well.    Discharge Diagnoses:  1. Small bowel obstruction PRINCIPAL 2. Essential hypertension  Discharge Condition: Improved Disposition: Home  Diet recommendation: Maintain a low residue diet for 7-10 days and then transition back to a high fiber diet. Patient encouraged to stay hydrated and chew food very well.   History of present illness:  62 year old woman PMH multiple abdominal surgeries, previous bowel obstruction, presented with abdominal pain.  Admitted for small bowel obstruction 11/9.  Seen by general surgery.  11/10 NG tube was removed and diet was advanced.  Hospital Course:  Patient was treated conservatively with NG tube and bowel rest.  Condition spontaneously improved, NG tube was removed, diet advanced and tolerated.  Followed by general surgery, now stable for discharge with recommendations for diet as documented above.  This was also discussed with the patient at the bedside.  Consultants:  General surgery   Today's assessment: S: Feels well, no complaints. O: Vitals:  Vitals:   08/27/18 2134 08/28/18 0513  BP: 124/78 121/74  Pulse: 72 69  Resp: 18 17  Temp: 98.3 F (36.8 C) 98.1 F (36.7 C)  SpO2: 100% 100%    Constitutional:  . Appears calm and comfortable Respiratory:  . CTA bilaterally, no w/r/r.  . Respiratory effort normal.  Cardiovascular:  . RRR, no m/r/g Abdomen: Soft, nontender, nondistended Psychiatric:  . Mental status o Mood, affect appropriate  No new data  Discharge Instructions Maintain a low residue diet for 7-10 days and then transition back to a high fiber diet. Patient  encouraged to stay hydrated and chew food very well.   Discharge Instructions    Activity as tolerated - No restrictions   Complete by:  As directed    Discharge instructions   Complete by:  As directed    Call your physician or seek immediate medical attention for pain, vomiting, abdominal bloating, constipation or worsening of condition. Maintain a low residue diet for 7-10 days and then transition back to a high fiber diet. Stay hydrated and chew food very well.     Allergies as of 08/28/2018      Reactions   Latex Itching      Medication List    TAKE these medications   amLODipine 10 MG tablet Commonly known as:  NORVASC TAKE ONE TABLET BY MOUTH ONCE DAILY   calcium carbonate 500 MG chewable tablet Commonly known as:  TUMS - dosed in mg elemental calcium Chew 1 tablet by mouth daily.   cetirizine 10 MG tablet Commonly known as:  ZYRTEC Take 1 tablet (10 mg total) by mouth daily.   diclofenac 75 MG EC tablet Commonly known as:  VOLTAREN Take 75 mg by mouth daily as needed for mild pain.   dicyclomine 20 MG tablet Commonly known as:  BENTYL TAKE ONE TABLET BY MOUTH THREE TIMES DAILY BEFORE MEAL(S) What changed:  See the new instructions.   irbesartan 300 MG tablet Commonly known as:  AVAPRO TAKE ONE TABLET BY MOUTH ONCE DAILY   meclizine 12.5 MG tablet Commonly known as:  ANTIVERT Take 1 tablet (12.5 mg total) by mouth 3 (three) times daily as needed for dizziness.   meloxicam  15 MG tablet Commonly known as:  MOBIC Take 1 tablet (15 mg total) by mouth daily as needed for pain.   methocarbamol 500 MG tablet Commonly known as:  ROBAXIN Take 500 mg by mouth every 6 (six) hours as needed for muscle spasms.   ondansetron 4 MG tablet Commonly known as:  ZOFRAN Take 1 tablet (4 mg total) by mouth every 8 (eight) hours as needed for nausea or vomiting.   OVER THE COUNTER MEDICATION Take 1 tablet by mouth daily. Product name: Curamin (OTC organic pain reliever)     polyethylene glycol packet Commonly known as:  MIRALAX / GLYCOLAX 17grams in 6 oz of water twice a day until bowel movement.  LAXITIVE.  Restart if two days since last bowel movement   Vitamin D3 125 MCG (5000 UT) Caps Take 5,000 Units by mouth daily.      Allergies  Allergen Reactions  . Latex Itching    The results of significant diagnostics from this hospitalization (including imaging, microbiology, ancillary and laboratory) are listed below for reference.    Significant Diagnostic Studies: Ct Abdomen Pelvis W Contrast  Result Date: 08/25/2018 CLINICAL DATA:  Abdominal pain, nausea and vomiting. EXAM: CT ABDOMEN AND PELVIS WITH CONTRAST TECHNIQUE: Multidetector CT imaging of the abdomen and pelvis was performed using the standard protocol following bolus administration of intravenous contrast. CONTRAST:  100mL ISOVUE-300 IOPAMIDOL (ISOVUE-300) INJECTION 61% COMPARISON:  06/13/2017 and 07/17/2012 FINDINGS: Lower chest: Lung bases are unremarkable with minimal linear atelectasis/scarring. Mild stable cardiomegaly. Hepatobiliary: Previous cholecystectomy. Liver and biliary tree are normal. Pancreas: Normal. Spleen: Normal. Adrenals/Urinary Tract: Adrenal glands are normal. Kidneys are normal in size without hydronephrosis or nephrolithiasis. Ureters and bladder are normal. Stomach/Bowel: Stomach is normal. There are a few fluid-filled minimally dilated small bowel loops over the lower abdomen unchanged and clustered mostly over the midline lower abdomen/pelvis and unchanged from the previous exam. Appendix is normal. Air is present throughout the colon. The descending colon is somewhat decompressed. Vascular/Lymphatic: Normal. Reproductive: Unremarkable. Other: No significant free fluid or focal inflammatory change. Postsurgical changes along the midline anterior lower abdominal wall. Musculoskeletal: Minimal degenerate change of the spine and hips. IMPRESSION: Several air and fluid-filled  mildly dilated small bowel loops mostly clustered over the midline lower abdomen/pelvis unchanged from the previous exam likely a stable chronic finding. Recurrent early/partial small bowel obstructive process is possible. Electronically Signed   By: Elberta Fortisaniel  Boyle M.D.   On: 08/25/2018 07:31   Dg Abdomen Acute W/chest  Result Date: 08/25/2018 CLINICAL DATA:  Acute onset of generalized abdominal spasms, nausea and vomiting. EXAM: DG ABDOMEN ACUTE W/ 1V CHEST COMPARISON:  Chest radiograph performed 01/05/2016, and abdominal radiograph performed 06/14/2017 FINDINGS: There is elevation of the right hemidiaphragm. There is no evidence of focal opacification, pleural effusion or pneumothorax. The cardiomediastinal silhouette is borderline normal in size. The visualized bowel gas pattern is unremarkable. Scattered stool and air are seen within the colon; there is no evidence of small bowel dilatation to suggest obstruction. No free intra-abdominal air is identified on the provided upright view. Clips are noted within the right upper quadrant, reflecting prior cholecystectomy. No acute osseous abnormalities are seen; the sacroiliac joints are unremarkable in appearance. IMPRESSION: 1. Unremarkable bowel gas pattern; no free intra-abdominal air seen. Small to moderate amount of stool noted in the colon. 2. No acute cardiopulmonary process seen. Electronically Signed   By: Roanna RaiderJeffery  Chang M.D.   On: 08/25/2018 05:39   Dg Abd Portable 1v-small Bowel Obstruction Protocol-initial,  8 Hr Delay  Result Date: 08/25/2018 CLINICAL DATA:  Small-bowel obstruction protocol, 8 hour delayed radiograph. EXAM: PORTABLE ABDOMEN - 1 VIEW COMPARISON:  Abdominal radiograph performed earlier today at 11:06 a.m. FINDINGS: The patient's enteric tube is noted ending overlying the antrum of the stomach. Contrast has progressed to the level of the colon, without evidence for bowel obstruction. The visualized bowel gas pattern is grossly  unremarkable. No free intra-abdominal air is seen, though evaluation for free air is limited on supine views. The visualized osseous structures are within normal limits; the sacroiliac joints are unremarkable in appearance. IMPRESSION: Enteric tube noted ending overlying the antrum of the stomach. Contrast has progressed to the level of the colon, without evidence for bowel obstruction. Electronically Signed   By: Roanna Raider M.D.   On: 08/25/2018 22:24   Dg Abd Portable 1v-small Bowel Protocol-position Verification  Result Date: 08/25/2018 CLINICAL DATA:  Pt c/o "spasm like" abdominal pain that started around midnight last night associated with nausea. Pt reported previous abdominal surgeries. EXAM: PORTABLE ABDOMEN - 1 VIEW COMPARISON:  CT, 08/25/2018 at 7:07 a.m. FINDINGS: Normal bowel gas pattern. No radiographic findings of obstruction on this single supine image. There is residual contrast in nondilated intrarenal collecting systems, and within the bladder. Nasogastric tube extends into the central abdomen consistent with location in the mid to distal stomach. Clips are upper quadrant reflect a prior cholecystectomy. No other soft tissue abnormality. IMPRESSION: 1. No acute findings. No radiographic evidence of bowel obstruction. Electronically Signed   By: Amie Portland M.D.   On: 08/25/2018 11:41   Labs: Basic Metabolic Panel: Recent Labs  Lab 08/25/18 0241 08/26/18 0507  NA 136 137  K 4.3 3.2*  CL 102 105  CO2 23 24  GLUCOSE 154* 106*  BUN 10 6*  CREATININE 0.65 0.48  CALCIUM 9.5 8.4*   Liver Function Tests: Recent Labs  Lab 08/25/18 0241  AST 27  ALT 18  ALKPHOS 82  BILITOT 1.3*  PROT 8.5*  ALBUMIN 4.5   Recent Labs  Lab 08/25/18 0241  LIPASE 22    Recent Labs  Lab 08/25/18 0241 08/26/18 0507  WBC 7.3 5.2  HGB 12.8 11.4*  HCT 41.3 37.0  MCV 85.2 86.9  PLT 285 259    Principal Problem:   SBO (small bowel obstruction) (HCC) Active Problems:   Essential  hypertension   Time coordinating discharge: 35 minutes  Signed:  Brendia Sacks, MD [facility] 08/28/2018, 10:13 AM

## 2018-08-28 NOTE — Progress Notes (Signed)
[facility] Progress Note     Subjective: CC:  Reports feeling better. Denies pain. Tolerating PO without N/V. +flatus and loose BMs.   Objective: Vital signs in last 24 hours: Temp:  [98.1 F (36.7 C)-98.4 F (36.9 C)] 98.1 F (36.7 C) (11/12 0513) Pulse Rate:  [69-79] 69 (11/12 0513) Resp:  [16-18] 17 (11/12 0513) BP: (121-126)/(68-78) 121/74 (11/12 0513) SpO2:  [100 %] 100 % (11/12 0513) Last BM Date: 08/26/18  Intake/Output from previous day: 11/11 0701 - 11/12 0700 In: 595 [P.O.:595] Out: 600 [Urine:600] Intake/Output this shift: No intake/output data recorded.  PE: Gen:  Alert, NAD, pleasant Card:  Regular rate and rhythm, pedal pulses 2+ BL Pulm:  Normal effort, clear to auscultation bilaterally Abd: Soft, mild upper abdominal tenderness without peritonitis, non-distended, +BS  Skin: warm and dry, no rashes  Psych: A&Ox3   Lab Results:  Recent Labs    08/26/18 0507  WBC 5.2  HGB 11.4*  HCT 37.0  PLT 259   BMET Recent Labs    08/26/18 0507  NA 137  K 3.2*  CL 105  CO2 24  GLUCOSE 106*  BUN 6*  CREATININE 0.48  CALCIUM 8.4*   PT/INR No results for input(s): LABPROT, INR in the last 72 hours. CMP     Component Value Date/Time   NA 137 08/26/2018 0507   K 3.2 (L) 08/26/2018 0507   CL 105 08/26/2018 0507   CO2 24 08/26/2018 0507   GLUCOSE 106 (H) 08/26/2018 0507   BUN 6 (L) 08/26/2018 0507   CREATININE 0.48 08/26/2018 0507   CALCIUM 8.4 (L) 08/26/2018 0507   CALCIUM 9.7 06/24/2011 0952   PROT 8.5 (H) 08/25/2018 0241   ALBUMIN 4.5 08/25/2018 0241   AST 27 08/25/2018 0241   ALT 18 08/25/2018 0241   ALKPHOS 82 08/25/2018 0241   BILITOT 1.3 (H) 08/25/2018 0241   GFRNONAA >60 08/26/2018 0507   GFRAA >60 08/26/2018 0507   Lipase     Component Value Date/Time   LIPASE 22 08/25/2018 0241       Studies/Results: No results found.  Anti-infectives: Anti-infectives (From admission, onward)   None        Assessment/Plan SBO  - PMH mult abd surgeries - NG removed 11/10, tolerating SOFT diet - stable for discharge from surgical perspective, we discussed maintaining a low residue diet for 7-10 days and then transitioning back to a high fiber diet. Patient encouraged to stay hydrated and chew her food very well.   FEN - SOFT ID - none VTE - SCD;s, lovenox   LOS: 0 days    Hosie SpangleElizabeth Leanard Dimaio, [facility] Pager: [phone]

## 2018-09-08 ENCOUNTER — Other Ambulatory Visit: Payer: Self-pay | Admitting: Internal Medicine

## 2018-09-25 DIAGNOSIS — Z0100 Encounter for examination of eyes and vision without abnormal findings: Secondary | ICD-10-CM | POA: Diagnosis not present

## 2018-10-30 ENCOUNTER — Ambulatory Visit: Payer: 59 | Admitting: Neurology

## 2018-10-30 NOTE — Progress Notes (Signed)
Bianca Campbell was seen today in neurologic consultation at the request of [name], MD.  The consultation is for the evaluation of abnormal brain scan.  This patient is accompanied in the office by her spouse who supplements the history.  The records that were made available to me were reviewed.  MRI brain was originally ordered due to symptoms of dizziness/vertigo in early september which was treated with meclizine.  That sx has resolved.  MRI was completed on 08/13/18.  I personally reviewed this.  There were a few rare scattered T2 hyperintensities.  There is a L paramedian pontine hyperintensity that was referred to by radiology as prior infarct.  Pt has never had known stroke.  No hx of lateralizing weakness or paresthesias.  No hx of vision loss, speech change.  She has had HTN for over 30 years.  She has had hyperlipidemia, "off and on", but is not on any medications for that.  She is not a smoker and has never been.     ALLERGIES:   Allergies  Allergen Reactions  . Latex Itching    CURRENT MEDICATIONS:  Outpatient Encounter Medications as of 10/31/2018  Medication Sig  . amLODipine (NORVASC) 10 MG tablet TAKE 1 TABLET BY MOUTH ONCE DAILY  . calcium carbonate (TUMS - DOSED IN MG ELEMENTAL CALCIUM) 500 MG chewable tablet Chew 1 tablet by mouth daily.  . cetirizine (ZYRTEC) 10 MG tablet Take 1 tablet (10 mg total) by mouth daily.  . Cholecalciferol (VITAMIN D3) 5000 units CAPS Take 5,000 Units by mouth daily.   . diclofenac (VOLTAREN) 75 MG EC tablet Take 75 mg by mouth daily as needed for mild pain.  Marland Kitchen dicyclomine (BENTYL) 20 MG tablet TAKE ONE TABLET BY MOUTH THREE TIMES DAILY BEFORE MEAL(S) (Patient taking differently: Take 20 mg by mouth 3 (three) times daily before meals. )  . irbesartan (AVAPRO) 300 MG tablet TAKE 1 TABLET BY MOUTH ONCE DAILY  . meclizine (ANTIVERT) 12.5 MG tablet Take 1 tablet (12.5 mg total) by mouth 3 (three) times daily as needed for dizziness.  .  meloxicam (MOBIC) 15 MG tablet Take 1 tablet (15 mg total) by mouth daily as needed for pain.  . methocarbamol (ROBAXIN) 500 MG tablet Take 500 mg by mouth every 6 (six) hours as needed for muscle spasms.   . ondansetron (ZOFRAN) 4 MG tablet Take 1 tablet (4 mg total) by mouth every 8 (eight) hours as needed for nausea or vomiting.  Marland Kitchen OVER THE COUNTER MEDICATION Take 1 tablet by mouth daily. Product name: Curamin (OTC organic pain reliever)  . polyethylene glycol (MIRALAX / GLYCOLAX) packet 17grams in 6 oz of water twice a day until bowel movement.  LAXITIVE.  Restart if two days since last bowel movement   No facility-administered encounter medications on file as of 10/31/2018.     PAST MEDICAL HISTORY:   Past Medical History:  Diagnosis Date  . ALLERGIC RHINITIS 09/11/2007  . Anemia, unspecified 08/31/2012  . Arthritis    "knees" (11/23/2016)  . BACK PAIN   . EXTERNAL OTITIS 09/11/2007  . GERD 06/06/2007  . GOUT 11/14/2007  . High cholesterol   . HYPERTENSION 06/06/2007  . INTERMITTENT VERTIGO 09/11/2007  . Irritable bowel syndrome 06/06/2007  . LOW BACK PAIN 06/06/2007  . OSTEOARTHRITIS 06/06/2007  . PONV (postoperative nausea and vomiting)   . Primary localized osteoarthritis of right knee   . SHOULDER PAIN, RIGHT 12/30/2008    PAST SURGICAL HISTORY:   Past  Surgical History:  Procedure Laterality Date  . BILATERAL SALPINGOOPHORECTOMY    . CESAREAN SECTION  1987, 1991  . COLONOSCOPY     pt stated 2015 or 2016 unknown md  . EXPLORATORY LAPAROTOMY     bowel obstruction  . INGUINAL HERNIA REPAIR Bilateral    numerous hernia repairs 1996 to 2011  . JOINT REPLACEMENT    . KNEE ARTHROSCOPY Bilateral 2013 - 11/11/2015  . LAPAROSCOPIC CHOLECYSTECTOMY  1991  . s/p incarcerated hernia and ovary cyst Jan 2011  2011  . TOTAL KNEE ARTHROPLASTY Right 11/21/2016   Procedure: RIGHT TOTAL KNEE ARTHROPLASTY;  Surgeon: Salvatore Marvelobert Wainer, MD;  [facility];  Service: Orthopedics;  Laterality: Right;   . UMBILICAL HERNIA REPAIR     numerous hernia repairs 1996 to 2011    SOCIAL HISTORY:   Social History   Socioeconomic History  . Marital status: Married    Spouse name: Not on file  . Number of children: Not on file  . Years of education: Not on file  . Highest education level: Not on file  Occupational History  . Occupation: unemployed recently laid off  Social Needs  . Financial resource strain: Not on file  . Food insecurity:    Worry: Not on file    Inability: Not on file  . Transportation needs:    Medical: Not on file    Non-medical: Not on file  Tobacco Use  . Smoking status: Never Smoker  . Smokeless tobacco: Never Used  Substance and Sexual Activity  . Alcohol use: No  . Drug use: No  . Sexual activity: Never  Lifestyle  . Physical activity:    Days per week: Not on file    Minutes per session: Not on file  . Stress: Not on file  Relationships  . Social connections:    Talks on phone: Not on file    Gets together: Not on file    Attends religious service: Not on file    Active member of club or organization: Not on file    Attends meetings of clubs or organizations: Not on file    Relationship status: Not on file  . Intimate partner violence:    Fear of current or ex partner: Not on file    Emotionally abused: Not on file    Physically abused: Not on file    Forced sexual activity: Not on file  Other Topics Concern  . Not on file  Social History Narrative  . Not on file    FAMILY HISTORY:   Family Status  Relation Name Status  . Mother  Alive  . Father  Alive  . Sister x2 Alive  . Brother x3 Alive  . Son  Alive  . Daughter  Alive  . Neg Hx  (Not Specified)    ROS:  Review of Systems  Constitutional: Negative.   HENT: Negative.   Eyes: Negative.   Cardiovascular: Negative.   Gastrointestinal: Negative.   Genitourinary: Negative.   Musculoskeletal: Negative.   Skin: Negative.   Endo/Heme/Allergies: Negative.     Psychiatric/Behavioral: Negative.     PHYSICAL EXAMINATION:    VITALS:   Vitals:   10/31/18 0900  BP: (!) 158/86  Pulse: 88  SpO2: 96%  Weight: 223 lb (101.2 kg)  Height: 4' 11.5" (1.511 m)    GEN:  Normal appears female in no acute distress.  Appears stated age. HEENT:  Normocephalic, atraumatic. The mucous membranes are moist. The superficial temporal arteries are without  ropiness or tenderness. Cardiovascular: Regular rate and rhythm. Lungs: Clear to auscultation bilaterally. Neck/Heme: There are no carotid bruits noted bilaterally.  NEUROLOGICAL: Orientation:  The patient is alert and oriented x 3.  Fund of knowledge is appropriate.  Recent and remote memory intact.  Attention span and concentration normal.  Repeats and names without difficulty. Cranial nerves: There is good facial symmetry. The pupils are equal round and reactive to light bilaterally. Fundoscopic exam reveals clear disc margins bilaterally. Extraocular muscles are intact and visual fields are full to confrontational testing. Speech is fluent and clear. Soft palate rises symmetrically and there is no tongue deviation. Hearing is intact to conversational tone. Tone: Tone is good throughout. Sensation: Sensation is intact to light touch and pinprick throughout (facial, trunk, extremities). Vibration is intact at the bilateral big toe. There is no extinction with double simultaneous stimulation. There is no sensory dermatomal level identified. Coordination:  The patient has no difficulty with RAM's or FNF bilaterally. Motor: Strength is 5/5 in the bilateral upper and lower extremities.  Shoulder shrug is equal and symmetric. There is no pronator drift.  There are no fasciculations noted. DTR's: Deep tendon reflexes are 2/4 at the bilateral biceps, triceps, brachioradialis, 1/4 at the bilateral patella and absent at the bilateral achilles.  Plantar responses are downgoing bilaterally. Gait and Station: The patient is  able to ambulate without difficulty. The patient is able to heel toe walk without any difficulty. The patient is able to ambulate in a tandem fashion. The patient is able to stand in the Romberg position.  Lab Results  Component Value Date   CHOL 185 06/20/2018   CHOL 182 06/27/2017   CHOL 172 01/12/2016   Lab Results  Component Value Date   HDL 71.00 06/20/2018   HDL 65.80 06/27/2017   HDL 61.50 01/12/2016   Lab Results  Component Value Date   LDLCALC 101 (H) 06/20/2018   LDLCALC 100 (H) 06/27/2017   LDLCALC 99 01/12/2016   Lab Results  Component Value Date   TRIG 66.0 06/20/2018   TRIG 81.0 06/27/2017   TRIG 55.0 01/12/2016   Lab Results  Component Value Date   CHOLHDL 3 06/20/2018   CHOLHDL 3 06/27/2017   CHOLHDL 3 01/12/2016   Lab Results  Component Value Date   LDLDIRECT 114.4 03/13/2013   LDLDIRECT 133.9 08/31/2012      IMPRESSION/PLAN  1. cerebral small vessel disease  -she has reason to have small vessel disease including HTN, hyperlipidemia  -MRI personally reviewed with patient and husband  -discussed stroke risk factors, including modifiable risk factors.  -would recommend lipids be more aggressively controlled.  Will discuss possible meds with PCP since not following up here on regular basis. She was agreeable to make f/u with PCP to discuss  -will check carotid u/s  -would recommend ASA, 81 mg daily, EC.  Risks, benefits, side effects and alternative therapies were discussed.  The opportunity to ask questions was given and they were answered to the best of my ability.  The patient expressed understanding and willingness to follow the outlined treatment protocols.  -discussed proper diet and exercise.  States that she just joined gym.    2.  F/u prn.  Much greater than 50% of this visit was spent in counseling and coordinating care.  Total face to face time:  45 min   Cc:  [name], MD

## 2018-10-31 ENCOUNTER — Ambulatory Visit: Payer: 59 | Admitting: Neurology

## 2018-10-31 ENCOUNTER — Encounter: Payer: Self-pay | Admitting: Neurology

## 2018-10-31 VITALS — BP 158/86 | HR 88 | Ht 59.5 in | Wt 223.0 lb

## 2018-10-31 DIAGNOSIS — I679 Cerebrovascular disease, unspecified: Secondary | ICD-10-CM | POA: Diagnosis not present

## 2018-10-31 DIAGNOSIS — R9089 Other abnormal findings on diagnostic imaging of central nervous system: Secondary | ICD-10-CM | POA: Diagnosis not present

## 2018-10-31 DIAGNOSIS — Z8673 Personal history of transient ischemic attack (TIA), and cerebral infarction without residual deficits: Secondary | ICD-10-CM

## 2018-10-31 NOTE — Patient Instructions (Signed)
1. We have you scheduled for your Carotid Doppler on 11/07/2018 at 10:00 am. Please arrive 15 minutes prior and go to first floor radiology at [facility]. If this is not a good date/time please call [phone] to reschedule.   2. Start a coated 81 mg aspirin daily.

## 2018-11-07 ENCOUNTER — Ambulatory Visit (HOSPITAL_COMMUNITY): Payer: 59

## 2018-11-12 ENCOUNTER — Ambulatory Visit (HOSPITAL_COMMUNITY)
Admission: RE | Admit: 2018-11-12 | Discharge: 2018-11-12 | Disposition: A | Discharge status: Home / Self Care | Payer: 59 | Source: Ambulatory Visit | Attending: Neurology | Admitting: Neurology

## 2018-11-12 ENCOUNTER — Telehealth: Payer: Self-pay | Admitting: Neurology

## 2018-11-12 DIAGNOSIS — R9089 Other abnormal findings on diagnostic imaging of central nervous system: Secondary | ICD-10-CM | POA: Diagnosis not present

## 2018-11-12 NOTE — Telephone Encounter (Signed)
Mychart message sent to patient.

## 2018-11-12 NOTE — Telephone Encounter (Signed)
-----   Message from Octaviano Batty Tat, DO sent at 11/12/2018 11:34 AM EST ----- Let pt know that carotid u/s is good

## 2018-11-12 NOTE — Progress Notes (Signed)
Bilateral carotid duplex completed - preliminary results found in Chart review CV Proc. [place] Vick Filter,RVS 11/12/2018, 11:24 am

## 2018-12-19 ENCOUNTER — Encounter: Payer: Self-pay | Admitting: Internal Medicine

## 2018-12-19 ENCOUNTER — Ambulatory Visit: Payer: 59 | Admitting: Internal Medicine

## 2018-12-19 VITALS — BP 118/76 | HR 84 | Temp 98.0°F | Ht 59.5 in | Wt 220.0 lb

## 2018-12-19 DIAGNOSIS — R739 Hyperglycemia, unspecified: Secondary | ICD-10-CM

## 2018-12-19 DIAGNOSIS — M1990 Unspecified osteoarthritis, unspecified site: Secondary | ICD-10-CM

## 2018-12-19 DIAGNOSIS — E785 Hyperlipidemia, unspecified: Secondary | ICD-10-CM

## 2018-12-19 DIAGNOSIS — I10 Essential (primary) hypertension: Secondary | ICD-10-CM

## 2018-12-19 LAB — POCT GLYCOSYLATED HEMOGLOBIN (HGB A1C): Hemoglobin A1C: 6.1 % — AB (ref 4.0–5.6)

## 2018-12-19 MED ORDER — ONDANSETRON HCL 4 MG PO TABS: 4.0000 mg | ORAL_TABLET | Freq: Three times a day (TID) | ORAL | 1 refills | Status: DC | PRN

## 2018-12-19 MED ORDER — MECLIZINE HCL 12.5 MG PO TABS: 12.5000 mg | ORAL_TABLET | Freq: Three times a day (TID) | ORAL | 1 refills | Status: DC | PRN

## 2018-12-19 MED ORDER — DICYCLOMINE HCL 20 MG PO TABS: ORAL_TABLET | ORAL | 1 refills | Status: DC

## 2018-12-19 MED ORDER — MELOXICAM 15 MG PO TABS: 15.0000 mg | ORAL_TABLET | Freq: Every day | ORAL | 1 refills | Status: DC | PRN

## 2018-12-19 NOTE — Assessment & Plan Note (Signed)
For cont'd lower chol diet,  to f/u any worsening symptoms or concerns

## 2018-12-19 NOTE — Assessment & Plan Note (Signed)
stable overall by history and exam, recent data reviewed with pt, and pt to continue medical treatment as before,  to f/u any worsening symptoms or concerns  

## 2018-12-19 NOTE — Patient Instructions (Signed)
Your A1c was OK today  Please continue all other medications as before, and refills have been done if requested.  Please have the pharmacy call with any other refills you may need.  Please continue your efforts at being more active, low cholesterol diet, and weight control..  Please keep your appointments with your specialists as you may have planned  Please return in 6 months, or sooner if needed, with Lab testing done 3-5 days before  

## 2018-12-19 NOTE — Assessment & Plan Note (Signed)
stable overall by history and exam, recent data reviewed with pt, and pt to continue medical treatment as before,  to f/u any worsening symptoms or concerns, for a1c with labs 

## 2018-12-19 NOTE — Assessment & Plan Note (Signed)
Mild to mod, for nsaid prn,  to f/u any worsening symptoms or concerns 

## 2018-12-19 NOTE — Progress Notes (Addendum)
Subjective:    Patient ID: Bianca Campbell, female    DOB: [birth year], 63 y.o.   MRN: [id number]  HPI    Here to f/u; overall doing ok,  Pt denies chest pain, increasing sob or doe, wheezing, orthopnea, PND, increased LE swelling, palpitations, dizziness or syncope.  Pt denies new neurological symptoms such as new headache, or facial or extremity weakness or numbness.  Pt denies polydipsia, polyuria, or low sugar episode.  Pt states overall good compliance with meds, mostly trying to follow appropriate diet, with wt overall stable,  but little exercise however.   Wt Readings from Last 3 Encounters:  12/19/18 220 lb (99.8 kg)  10/31/18 223 lb (101.2 kg)  06/22/18 222 lb (100.7 kg)  Also c/o several joint chronic arthritic pain, including lower back and knees.  No hter new complaints Past Medical History:  Diagnosis Date  . ALLERGIC RHINITIS 09/11/2007  . Anemia, unspecified 08/31/2012  . Arthritis    "knees" (11/23/2016)  . BACK PAIN   . EXTERNAL OTITIS 09/11/2007  . GERD 06/06/2007  . GOUT 11/14/2007  . High cholesterol   . HYPERTENSION 06/06/2007  . INTERMITTENT VERTIGO 09/11/2007  . Irritable bowel syndrome 06/06/2007  . LOW BACK PAIN 06/06/2007  . OSTEOARTHRITIS 06/06/2007  . PONV (postoperative nausea and vomiting)   . Primary localized osteoarthritis of right knee   . SHOULDER PAIN, RIGHT 12/30/2008   Past Surgical History:  Procedure Laterality Date  . BILATERAL SALPINGOOPHORECTOMY    . CESAREAN SECTION  1987, 1991  . COLONOSCOPY     pt stated 2015 or 2016 unknown md  . EXPLORATORY LAPAROTOMY     bowel obstruction  . INGUINAL HERNIA REPAIR Bilateral    numerous hernia repairs 1996 to 2011  . JOINT REPLACEMENT    . KNEE ARTHROSCOPY Bilateral 2013 - 11/11/2015  . LAPAROSCOPIC CHOLECYSTECTOMY  1991  . s/p incarcerated hernia and ovary cyst Jan 2011  2011  . TOTAL KNEE ARTHROPLASTY Right 11/21/2016   Procedure: RIGHT TOTAL KNEE ARTHROPLASTY;  Surgeon: Salvatore Marvel, MD;   Location: [facility] OR;  Service: Orthopedics;  Laterality: Right;  . UMBILICAL HERNIA REPAIR     numerous hernia repairs 1996 to 2011    reports that she has never smoked. She has never used smokeless tobacco. She reports that she does not drink alcohol or use drugs. family history includes Arthritis in her mother; Asthma in her daughter; Colon polyps (age of onset: 11) in her father; Diabetes in her mother; Healthy in her brother, daughter, sister, and son; Seizures in her mother. Allergies  Allergen Reactions  . Latex Itching   Current Outpatient Medications on File Prior to Visit  Medication Sig Dispense Refill  . amLODipine (NORVASC) 10 MG tablet TAKE 1 TABLET BY MOUTH ONCE DAILY 90 tablet 1  . calcium carbonate (TUMS - DOSED IN MG ELEMENTAL CALCIUM) 500 MG chewable tablet Chew 1 tablet by mouth daily.    . cetirizine (ZYRTEC) 10 MG tablet Take 1 tablet (10 mg total) by mouth daily. 30 tablet 11  . Cholecalciferol (VITAMIN D3) 5000 units CAPS Take 5,000 Units by mouth daily.     . diclofenac (VOLTAREN) 75 MG EC tablet Take 75 mg by mouth daily as needed for mild pain.    Marland Kitchen irbesartan (AVAPRO) 300 MG tablet TAKE 1 TABLET BY MOUTH ONCE DAILY 90 tablet 1  . methocarbamol (ROBAXIN) 500 MG tablet Take 500 mg by mouth every 6 (six) hours as needed for muscle spasms.  5  . OVER THE COUNTER MEDICATION Take 1 tablet by mouth daily. Product name: Curamin (OTC organic pain reliever)    . polyethylene glycol (MIRALAX / GLYCOLAX) packet 17grams in 6 oz of water twice a day until bowel movement.  LAXITIVE.  Restart if two days since last bowel movement 14 each 0   No current facility-administered medications on file prior to visit.    Review of Systems  Constitutional: Negative for other unusual diaphoresis or sweats HENT: Negative for ear discharge or swelling Eyes: Negative for other worsening visual disturbances Respiratory: Negative for stridor or other swelling  Gastrointestinal: Negative for  worsening distension or other blood Genitourinary: Negative for retention or other urinary change Musculoskeletal: Negative for other MSK pain or swelling Skin: Negative for color change or other new lesions Neurological: Negative for worsening tremors and other numbness  Psychiatric/Behavioral: Negative for worsening agitation or other fatigue All other system neg per pt    Objective:   Physical Exam BP 118/76   Pulse 84   Temp 98 F (36.7 C) (Oral)   Ht 4' 11.5" (1.511 m)   Wt 220 lb (99.8 kg)   SpO2 94%   BMI 43.69 kg/m  VS noted,  Constitutional: Pt appears in NAD HENT: Head: NCAT.  Right Ear: External ear normal.  Left Ear: External ear normal.  Eyes: . Pupils are equal, round, and reactive to light. Conjunctivae and EOM are normal Nose: without d/c or deformity Neck: Neck supple. Gross normal ROM Cardiovascular: Normal rate and regular rhythm.   Pulmonary/Chest: Effort normal and breath sounds without rales or wheezing.  Abd:  Soft, NT, ND, + BS, no organomegaly Neurological: Pt is alert. At baseline orientation, motor grossly intact Skin: Skin is warm. No rashes, other new lesions, no LE edema Psychiatric: Pt behavior is normal without agitation  No other exam findings  Lab Results  Component Value Date   WBC 5.2 08/26/2018   HGB 11.4 (L) 08/26/2018   HCT 37.0 08/26/2018   PLT 259 08/26/2018   GLUCOSE 106 (H) 08/26/2018   CHOL 185 06/20/2018   TRIG 66.0 06/20/2018   HDL 71.00 06/20/2018   LDLDIRECT 114.4 03/13/2013   LDLCALC 101 (H) 06/20/2018   ALT 18 08/25/2018   AST 27 08/25/2018   NA 137 08/26/2018   K 3.2 (L) 08/26/2018   CL 105 08/26/2018   CREATININE 0.48 08/26/2018   BUN 6 (L) 08/26/2018   CO2 24 08/26/2018   TSH 2.22 06/20/2018   INR 1.06 11/10/2016   HGBA1C 6.9 (H) 06/20/2018   POCT glycosylated hemoglobin (Hb A1C)  Order: [id number]  Status:  Final result Visible to patient:  No (Not Released) Dx:  Hyperglycemia   Ref Range & Units 1d  ago (12/19/18) 40mo ago (06/20/18) 63yr ago (06/27/17) 45yr ago (11/21/16) 33yr ago (01/12/16) 6yr ago (09/15/15) 61yr ago (09/25/13)  Hemoglobin A1C 4.0 - 5.6 % 6.1Abnormal   6.9High  R, CM 6.5 R, CM 6.3High  R, CM 7.1High  R, CM 6.4 R, CM 6.2 R, CM           Assessment & Plan:

## 2018-12-24 DIAGNOSIS — M25561 Pain in right knee: Secondary | ICD-10-CM | POA: Diagnosis not present

## 2019-01-16 ENCOUNTER — Other Ambulatory Visit: Payer: Self-pay

## 2019-01-16 ENCOUNTER — Encounter (HOSPITAL_COMMUNITY): Payer: Self-pay | Admitting: Emergency Medicine

## 2019-01-16 ENCOUNTER — Emergency Department (HOSPITAL_COMMUNITY)
Admission: EM | Admit: 2019-01-16 | Discharge: 2019-01-16 | Disposition: A | Discharge status: Home / Self Care | Payer: 59 | Attending: Emergency Medicine | Admitting: Emergency Medicine

## 2019-01-16 ENCOUNTER — Ambulatory Visit: Payer: Self-pay

## 2019-01-16 ENCOUNTER — Emergency Department (HOSPITAL_COMMUNITY): Payer: 59

## 2019-01-16 DIAGNOSIS — I10 Essential (primary) hypertension: Secondary | ICD-10-CM | POA: Diagnosis not present

## 2019-01-16 DIAGNOSIS — Z91040 Latex allergy status: Secondary | ICD-10-CM | POA: Diagnosis not present

## 2019-01-16 DIAGNOSIS — R0789 Other chest pain: Secondary | ICD-10-CM | POA: Diagnosis not present

## 2019-01-16 DIAGNOSIS — K219 Gastro-esophageal reflux disease without esophagitis: Secondary | ICD-10-CM | POA: Diagnosis not present

## 2019-01-16 DIAGNOSIS — Z96651 Presence of right artificial knee joint: Secondary | ICD-10-CM | POA: Diagnosis not present

## 2019-01-16 DIAGNOSIS — R079 Chest pain, unspecified: Secondary | ICD-10-CM | POA: Insufficient documentation

## 2019-01-16 DIAGNOSIS — Z79899 Other long term (current) drug therapy: Secondary | ICD-10-CM | POA: Insufficient documentation

## 2019-01-16 LAB — BASIC METABOLIC PANEL
Anion gap: 10 (ref 5–15)
BUN: 9 mg/dL (ref 8–23)
CO2: 26 mmol/L (ref 22–32)
Calcium: 9.9 mg/dL (ref 8.9–10.3)
Chloride: 102 mmol/L (ref 98–111)
Creatinine, Ser: 0.59 mg/dL (ref 0.44–1.00)
GFR calc Af Amer: >60 mL/min (ref >60)
GFR calc non Af Amer: >60 mL/min (ref >60)
Glucose, Bld: 138 mg/dL — ABNORMAL HIGH (ref 70–99)
Potassium: 3.4 mmol/L — ABNORMAL LOW (ref 3.5–5.1)
Sodium: 138 mmol/L (ref 135–145)

## 2019-01-16 LAB — CBC
HCT: 42.5 % (ref 36.0–46.0)
Hemoglobin: 13 g/dL (ref 12.0–15.0)
MCH: 26.3 pg (ref 26.0–34.0)
MCHC: 30.6 g/dL (ref 30.0–36.0)
MCV: 86 fL (ref 80.0–100.0)
Platelets: 374 10*3/uL (ref 150–400)
RBC: 4.94 MIL/uL (ref 3.87–5.11)
RDW: 13.2 % (ref 11.5–15.5)
WBC: 6.5 10*3/uL (ref 4.0–10.5)
nRBC: 0 % (ref 0.0–0.2)

## 2019-01-16 LAB — HEPATIC FUNCTION PANEL
ALT: 15 U/L (ref 0–44)
AST: 19 U/L (ref 15–41)
Albumin: 4.9 g/dL (ref 3.5–5.0)
Alkaline Phosphatase: 104 U/L (ref 38–126)
Bilirubin, Direct: 0.1 mg/dL (ref 0.0–0.2)
Indirect Bilirubin: 0.8 mg/dL (ref 0.3–0.9)
Total Bilirubin: 0.9 mg/dL (ref 0.3–1.2)
Total Protein: 9.1 g/dL — ABNORMAL HIGH (ref 6.5–8.1)

## 2019-01-16 LAB — TROPONIN I: Troponin I: <0.03 ng/mL (ref <0.03)

## 2019-01-16 MED ORDER — ALUM & MAG HYDROXIDE-SIMETH 200-200-20 MG/5ML PO SUSP
30.0000 mL | Freq: Once | ORAL | Status: AC
  Administered 2019-01-16: 30 mL via ORAL
  Filled 2019-01-16: qty 30

## 2019-01-16 MED ORDER — OMEPRAZOLE 20 MG PO CPDR: 20.0000 mg | DELAYED_RELEASE_CAPSULE | Freq: Two times a day (BID) | ORAL | 0 refills | Status: DC

## 2019-01-16 NOTE — ED Provider Notes (Signed)
[facility]-EMERGENCY DEPT Provider Note   CSN: [id number] Arrival date & time: 01/16/19  1116    History   Chief Complaint Chief Complaint  Patient presents with  . chest pain    HPI Bianca Campbell is a 63 y.o. female.     63 y.o female with a PMH of HTN, GERD, previous brain infarct presents to the ED with a chief complaint of chest pain x 1 week. Patient describes her pain as a intermittent pressure located to the center of her chest with radiation to her left arm. She reports the pain is exacerbated with deep breathing, she also reports the pain is somewhat relieved with Tums and belching.  Patient reports she has had this pain before, unsure how they resolved.  She reports worrying a cardiac monitor Holter monitor in the month of October for 1 month with a reassuring work-up.  She also reports a previous stress test last year which was negative.  She was under the care of cardiologist Dr. Gwenlyn Saran, after reviewing her records she does have a stress test listed in 2008 which showed normal results.  Patient denies any shortness of breath, nausea, vomiting, fever or abdominal pain.  Patient denies any previous cardiac history, does report her mother had some stents placed in her late 60s, unsure of the reasoning.     Past Medical History:  Diagnosis Date  . ALLERGIC RHINITIS 09/11/2007  . Anemia, unspecified 08/31/2012  . Arthritis    "knees" (11/23/2016)  . BACK PAIN   . EXTERNAL OTITIS 09/11/2007  . GERD 06/06/2007  . GOUT 11/14/2007  . High cholesterol   . HYPERTENSION 06/06/2007  . INTERMITTENT VERTIGO 09/11/2007  . Irritable bowel syndrome 06/06/2007  . LOW BACK PAIN 06/06/2007  . OSTEOARTHRITIS 06/06/2007  . PONV (postoperative nausea and vomiting)   . Primary localized osteoarthritis of right knee   . SHOULDER PAIN, RIGHT 12/30/2008    Patient Active Problem List   Diagnosis Date Noted  . Left foot pain 06/25/2018  . Vertigo of central origin  06/20/2018  . Left ankle pain 06/20/2018  . Palpitations 06/20/2018  . Hyperglycemia 06/20/2018  . Lateral epicondylitis, right elbow 09/18/2017  . Hypokalemia 06/27/2017  . Encounter for imaging study to confirm nasogastric (NG) tube placement   . SBO (small bowel obstruction) (HCC) 06/13/2017  . 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 Thrush 12/13/2016  . Primary localized osteoarthritis of right knee   . Abdominal pain 01/15/2016  . Orthostasis 09/15/2015  . Pain of multiple sites 01/15/2015  . Bunion, right foot 12/30/2014  . Elevated total protein 09/09/2014  . Hyperlipidemia 03/13/2013  . Unspecified vitamin D deficiency 03/13/2013  . Bilateral foot pain 08/31/2012  . Anemia, unspecified 08/31/2012  . Pain in both feet 07/10/2012  . Medial epicondylitis of left elbow 07/10/2012  . Medial epicondylitis of right elbow 07/10/2012  . Left knee pain 07/10/2012  . Chest pain 05/22/2012  . Right shoulder pain 05/22/2012  . Right foot strain 05/22/2012  . Right knee pain 03/25/2012  . Encounter for well adult exam with abnormal findings 06/24/2011  . Gastroenteritis 06/24/2011  . Hypercalcemia 06/24/2011  . SHOULDER PAIN, RIGHT 12/30/2008  . Gout 11/14/2007  . Backache 11/14/2007  . ALLERGIC RHINITIS 09/11/2007  . INTERMITTENT VERTIGO 09/11/2007  . Essential hypertension 06/06/2007  . GERD 06/06/2007  . Irritable bowel syndrome 06/06/2007  . Osteoarthritis 06/06/2007  . Low back pain 06/06/2007    Past Surgical History:  Procedure Laterality Date  .  BILATERAL SALPINGOOPHORECTOMY    . CESAREAN SECTION  1987, 1991  . COLONOSCOPY     pt stated 2015 or 2016 unknown md  . EXPLORATORY LAPAROTOMY     bowel obstruction  . INGUINAL HERNIA REPAIR Bilateral    numerous hernia repairs 1996 to 2011  . JOINT REPLACEMENT    . KNEE ARTHROSCOPY Bilateral 2013 - 11/11/2015  . LAPAROSCOPIC CHOLECYSTECTOMY  1991  . s/p incarcerated hernia and ovary cyst Jan 2011  2011  . TOTAL KNEE ARTHROPLASTY  Right 11/21/2016   Procedure: RIGHT TOTAL KNEE ARTHROPLASTY;  Surgeon: Salvatore Marvel, MD;  Location: [facility] OR;  Service: Orthopedics;  Laterality: Right;  . UMBILICAL HERNIA REPAIR     numerous hernia repairs 1996 to 2011     OB History   No obstetric history on file.      Home Medications    Prior to Admission medications   Medication Sig Start Date End Date Taking? Authorizing Provider  amLODipine (NORVASC) 10 MG tablet TAKE 1 TABLET BY MOUTH ONCE DAILY 09/10/18  Yes [name], MD  BLACK CURRANT SEED OIL PO Take 1 tablet by mouth daily.   Yes [provider]  cetirizine (ZYRTEC) 10 MG tablet Take 1 tablet (10 mg total) by mouth daily. 06/20/18 06/20/19 Yes [name], MD  Cholecalciferol (VITAMIN D3) 5000 units CAPS Take 5,000 Units by mouth daily.    Yes [provider]  Cyanocobalamin (VITAMIN B 12 PO) Take 1 tablet by mouth daily.   Yes [provider]  diclofenac (VOLTAREN) 75 MG EC tablet Take 75 mg by mouth daily as needed for mild pain.   Yes [provider]  dicyclomine (BENTYL) 20 MG tablet TAKE ONE TABLET BY MOUTH THREE TIMES DAILY BEFORE MEAL(S) 12/19/18  Yes [name], MD  irbesartan (AVAPRO) 300 MG tablet TAKE 1 TABLET BY MOUTH ONCE DAILY 09/10/18  Yes [name], MD  meclizine (ANTIVERT) 12.5 MG tablet Take 1 tablet (12.5 mg total) by mouth 3 (three) times daily as needed for dizziness. 12/19/18 12/19/19 Yes [name], MD  meloxicam (MOBIC) 15 MG tablet Take 1 tablet (15 mg total) by mouth daily as needed for pain. 12/19/18  Yes [name], MD  ondansetron (ZOFRAN) 4 MG tablet Take 1 tablet (4 mg total) by mouth every 8 (eight) hours as needed for nausea or vomiting. 12/19/18  Yes [name], MD  omeprazole (PRILOSEC) 20 MG capsule Take 1 capsule (20 mg total) by mouth 2 (two) times daily before a meal for 14 days. 01/16/19 01/30/19  Claude Manges, PA-C  polyethylene glycol (MIRALAX / GLYCOLAX) packet 17grams in 6 oz of water twice a  day until bowel movement.  LAXITIVE.  Restart if two days since last bowel movement Patient not taking: Reported on 01/16/2019 11/24/16   Julien Girt, PA-C    Family History Family History  Problem Relation Age of Onset  . Arthritis Mother        RA  . Diabetes Mother   . Seizures Mother   . Colon polyps Father 9  . Healthy Sister   . Healthy Brother   . Healthy Son   . Healthy Daughter   . Asthma Daughter   . Colon cancer Neg Hx     Social History Social History   Tobacco Use  . Smoking status: Never Smoker  . Smokeless tobacco: Never Used  Substance Use Topics  . Alcohol use: No  . Drug use: No  Allergies   Latex   Review of Systems Review of Systems  Constitutional: Negative for chills and fever.  HENT: Negative for ear pain and sore throat.   Eyes: Negative for pain and visual disturbance.  Respiratory: Negative for cough and shortness of breath.   Cardiovascular: Positive for chest pain and palpitations.  Gastrointestinal: Negative for abdominal pain, diarrhea, nausea and vomiting.  Genitourinary: Negative for dysuria and hematuria.  Musculoskeletal: Negative for arthralgias and back pain.  Skin: Negative for color change and rash.  Neurological: Positive for dizziness (reports this at baseline). Negative for seizures, syncope, light-headedness and headaches.  All other systems reviewed and are negative.    Physical Exam Updated Vital Signs BP 138/85   Pulse 70   Temp 98.4 F (36.9 C) (Oral)   Resp 12   Ht 4' 11.5" (1.511 m)   Wt 98.4 kg   SpO2 100%   BMI 43.10 kg/m   Physical Exam Vitals signs and nursing note reviewed.  Constitutional:      General: She is not in acute distress.    Appearance: She is well-developed. She is not ill-appearing.  HENT:     Head: Normocephalic and atraumatic.     Mouth/Throat:     Pharynx: No oropharyngeal exudate.  Eyes:     Pupils: Pupils are equal, round, and reactive to light.  Neck:      Musculoskeletal: Normal range of motion.  Cardiovascular:     Rate and Rhythm: Normal rate and regular rhythm.     Pulses:          Posterior tibial pulses are 2+ on the right side and 2+ on the left side.     Heart sounds: Normal heart sounds.     Comments: No pitting edema bilaterally. Pulmonary:     Effort: Pulmonary effort is normal. No respiratory distress.     Breath sounds: Normal breath sounds. No decreased breath sounds or wheezing.     Comments: Lungs are clear to auscultation and on all lung fields.  No wheezing, rhonchi, rales. Chest:     Chest wall: No tenderness.     Comments: No tenderness with palpation along chest wall. Abdominal:     General: Bowel sounds are normal. There is no distension.     Palpations: Abdomen is soft.     Tenderness: There is no abdominal tenderness.  Musculoskeletal:        General: No tenderness or deformity.     Right lower leg: No edema.     Left lower leg: No edema.  Skin:    General: Skin is warm and dry.  Neurological:     Mental Status: She is alert and oriented to person, place, and time.     Comments: Alert, oriented, thought content appropriate. Speech fluent without evidence of aphasia. Able to follow 2 step commands without difficulty.  Cranial Nerves:  II:  Peripheral visual fields grossly normal, pupils, round, reactive to light III,IV, VI: ptosis not present, extra-ocular motions intact bilaterally  V,VII: smile symmetric, facial light touch sensation equal VIII: hearing grossly normal bilaterally  IX,X: midline uvula rise  XI: bilateral shoulder shrug equal and strong XII: midline tongue extension  Motor:  5/5 in upper and lower extremities bilaterally including strong and equal grip strength and dorsiflexion/plantar flexion Sensory: light touch normal in all extremities.  Cerebellar: normal finger-to-nose with bilateral upper extremities, pronator drift negative Gait: normal gait and balance       ED Treatments /  Results  Labs (all labs ordered are listed, but only abnormal results are displayed) Labs Reviewed  BASIC METABOLIC PANEL - Abnormal; Notable for the following components:      Result Value   Potassium 3.4 (*)    Glucose, Bld 138 (*)    All other components within normal limits  HEPATIC FUNCTION PANEL - Abnormal; Notable for the following components:   Total Protein 9.1 (*)    All other components within normal limits  CBC  TROPONIN I    EKG EKG Interpretation  Date/Time:  Wednesday January 16 2019 11:24:58 EDT Ventricular Rate:  78 PR Interval:    QRS Duration: 91 QT Interval:  360 QTC Calculation: 410 R Axis:   -2 Text Interpretation:  Sinus rhythm Borderline T abnormalities, lateral leads Confirmed by Benjiman CorePickering, Nathan [phone]([id number]) on 01/16/2019 12:10:25 PM   Radiology Dg Chest 2 View  Result Date: 01/16/2019 CLINICAL DATA:  Chest pain for 1 week EXAM: CHEST - 2 VIEW COMPARISON:  08/25/2018 FINDINGS: The heart size and mediastinal contours are within normal limits. Both lungs are clear. The visualized skeletal structures are unremarkable. IMPRESSION: No active cardiopulmonary disease. Electronically Signed   By: Alcide CleverMark  Lukens M.D.   On: 01/16/2019 12:37    Procedures Procedures (including critical care time)  Medications Ordered in ED Medications  alum & mag hydroxide-simeth (MAALOX/MYLANTA) 200-200-20 MG/5ML suspension 30 mL (30 mLs Oral Given 01/16/19 1237)     Initial Impression / Assessment and Plan / ED Course  I have reviewed the triage vital signs and the nursing notes.  Pertinent labs & imaging results that were available during my care of the patient were reviewed by me and considered in my medical decision making (see chart for details).    Patient presents with new onset of chest pain x1 week, describes it as pressure to the center of her chest with radiation to her left arm.  Does report some relief with belching along with Tums.  Does report a similar episode  unsure of how it resolved. Patient had a stress test done sometime in 2018 which was normal.  She also had a Holter monitor last year which was normal in nature.  Differentials include ACS, GERD, atypical pain.  CBC showed no leukocytosis, hemoglobin is within normal limits.  BMP shows slight decrease in potassium, otherwise no electrolyte abnormality.  Creatinine is within normal limits.  Hepatic function showed no elevation of LFTs.  First troponin was negative, EKG showed no changes consistent with infarct or STEMI.  Vital signs in the emergency department have been stable, slight elevated blood pressure on arrival but improved.  Patient was tried on GI cocktail which help with her symptoms.  Low suspicion for ACS with reassuring work-up, think this is likely a GERD related pain, she denies any shortness of breath.  Chest x-ray showed no pneumothorax, pleural effusion, consolidation.  No shortness of breath, orthopnea, swelling to her legs low suspicion for heart failure. No tachycardia, risk factors, doubt PE.  Patient is to follow-up with PCP, she is to be placed on omeprazole twice daily to help with her symptoms.  Patient agrees and understands results at this time.   Final Clinical Impressions(s) / ED Diagnoses   Final diagnoses:  Chest pain, unspecified type  Gastroesophageal reflux disease without esophagitis    ED Discharge Orders         Ordered    omeprazole (PRILOSEC) 20 MG capsule  2 times daily before meals     01/16/19 1333  Claude Manges, PA-C 01/16/19 1352    Benjiman Core, MD 01/16/19 [phone]

## 2019-01-16 NOTE — Telephone Encounter (Signed)
Routing to dr john, fyi 

## 2019-01-16 NOTE — ED Triage Notes (Signed)
Pt c/o chest pains that have ben intermittent for past week that are worse at night. Reports pain radiates to left arm.

## 2019-01-16 NOTE — Telephone Encounter (Signed)
Pt. called to report intermittent mid-chest pain, between her breasts.  Reported this "has been going on for awhile."  Stated the discomfort "feels like a pressure, that goes up into the neck every now and then."  During call, c/o feeling hot, with mid chest pain that went into the left arm.  Rated pain at 7-8/10.  Stated the pain lasts sometimes for about 10 minutes.  Denied shortness of breath.  Denied nausea/ vomiting.  Stated it hurts to take a deep breath.  Reported taking Rolaids and Tums, with relief for short period of time.  Advised to go to the ER at this time.  Stated her husband will take her to the ER.      Reason for Disposition . Chest pain lasts > 5 minutes (Exceptions: chest pain occurring > 3 days ago and now asymptomatic; same as previously diagnosed heartburn and has accompanying sour taste in mouth)  Answer Assessment - Initial Assessment Questions 1. LOCATION: "Where does it hurt?"       Middle of chest, between breasts;  2. RADIATION: "Does the pain go anywhere else?" (e.g., into neck, jaw, arms, back)     "it goes into the neck every now and then" 3. ONSET: "When did the chest pain begin?" (Minutes, hours or days)      It started quite awhile ago; had heart monitor done last Oct. 2019 4. PATTERN "Does the pain come and go, or has it been constant since it started?"  "Does it get worse with exertion?"      Coming and going.  5. DURATION: "How long does it last" (e.g., seconds, minutes, hours)     It comes and goes within a few minutes; "it lasts a good 10 minutes or so" 6. SEVERITY: "How bad is the pain?"  (e.g., Scale 1-10; mild, moderate, or severe)    - MILD (1-3): doesn't interfere with normal activities     - MODERATE (4-7): interferes with normal activities or awakens from sleep    - SEVERE (8-10): excruciating pain, unable to do any normal activities       7-8/10 7. CARDIAC RISK FACTORS: "Do you have any history of heart problems or risk factors for heart disease?"  (e.g., prior heart attack, angina; high blood pressure, diabetes, being overweight, high cholesterol, smoking, or strong family history of heart disease)     Positive for high blood pressure, is overweight ; denied DM, smoking, elev. Cholesterol; mother has hx of cardiac stent  8. PULMONARY RISK FACTORS: "Do you have any history of lung disease?"  (e.g., blood clots in lung, asthma, emphysema, birth control pills)     denied 9. CAUSE: "What do you think is causing the chest pain?"     *No Answer* 10. OTHER SYMPTOMS: "Do you have any other symptoms?" (e.g., dizziness, nausea, vomiting, sweating, fever, difficulty breathing, cough)       Intermittent hot feeling, denied nausea/vomiting, shortness of breath, c/o mid chest pressure and left arm pain at time of Triage call.  11. PREGNANCY: "Is there any chance you are pregnant?" "When was your last menstrual period?"       N/a  Protocols used: CHEST PAIN-A-AH

## 2019-01-16 NOTE — Discharge Instructions (Addendum)
Your laboratory results were normal today.  Your chest x-ray was negative.  I have provided a prescription for omeprazole, please take 1 tablet twice a day for the next 14 days.  This should help with your symptoms.  Follow-up with your primary care physician as always recommended.

## 2019-02-11 ENCOUNTER — Ambulatory Visit: Payer: Self-pay | Admitting: *Deleted

## 2019-02-11 NOTE — Telephone Encounter (Signed)
Noted.  Dr. John FYI 

## 2019-02-11 NOTE — Telephone Encounter (Signed)
Summary: side effects of Gastroesophageal reflux disease without esophagitis   Patient calling and states that she would like to discuss with the nurse the side effects of the Gastroesophageal reflux disease without esophagitis that she was diagnosed with at the ER on 01/16/2019. States that the ER prescribed omeprazole 20 mg for 14 days. States that she picked up some of the same medication OTC, but seems to think that the one the doctor prescribed works better. Please advise.     Patient states was seen at ED and diagnosed with reflux disease. She was started on omeprazole. Things seemed to calm down- but when she finished the medication the discomfort came back. Patient would like to discuss what to do when she is not taking this medication. Patient has waves of pain- she gets very uncomfortable for a few seconds and then it gets better- she has been watching her diet. She has noticed she has been belching a lot. Patient will need an ED follow up visit to discuss how to handle her new diagnosis and medication follow up.  Reason for Disposition . Caller has URGENT medication question about med that PCP prescribed and triager unable to answer question  Answer Assessment - Initial Assessment Questions 1. SYMPTOMS: "Do you have any symptoms?"     Reflux pain 2. SEVERITY: If symptoms are present, ask "Are they mild, moderate or severe?"     Moderate.  Protocols used: MEDICATION QUESTION CALL-A-AH

## 2019-02-12 ENCOUNTER — Ambulatory Visit (INDEPENDENT_AMBULATORY_CARE_PROVIDER_SITE_OTHER): Payer: 59 | Admitting: Internal Medicine

## 2019-02-12 ENCOUNTER — Encounter: Payer: Self-pay | Admitting: Internal Medicine

## 2019-02-12 DIAGNOSIS — I10 Essential (primary) hypertension: Secondary | ICD-10-CM

## 2019-02-12 DIAGNOSIS — R739 Hyperglycemia, unspecified: Secondary | ICD-10-CM

## 2019-02-12 DIAGNOSIS — K219 Gastro-esophageal reflux disease without esophagitis: Secondary | ICD-10-CM | POA: Diagnosis not present

## 2019-02-12 MED ORDER — PANTOPRAZOLE SODIUM 40 MG PO TBEC: 40.0000 mg | DELAYED_RELEASE_TABLET | Freq: Two times a day (BID) | ORAL | 3 refills | Status: DC

## 2019-02-12 MED ORDER — FAMOTIDINE 20 MG PO TABS: 20.0000 mg | ORAL_TABLET | Freq: Every day | ORAL | 3 refills | Status: DC

## 2019-02-12 NOTE — Assessment & Plan Note (Signed)
May have related esophagitis, last egd 2010 normal, pt declines repeat for now, ok for change PPI to protonix 40 bid, pepcid 20 qhs for one month, then to protonix 40 qd only after, as well as anti reflux precautions

## 2019-02-12 NOTE — Assessment & Plan Note (Signed)
stable overall by history and exam, recent data reviewed with pt, and pt to continue medical treatment as before,  to f/u any worsening symptoms or concerns  

## 2019-02-12 NOTE — Patient Instructions (Signed)
Please take all new medication as prescribed - the protonix twice per day, and the pepcid at bedtime - all for one month  After that if you are doing ok, please change to taking Protonix 40 mg once in the AM only  Please avoid anti-inflammatory medications, excessive alcohol, tomato, high fat or spicy foods  Please continue all other medications as before, and refills have been done if requested.  Please have the pharmacy call with any other refills you may need.  Please continue your efforts at being more active, low cholesterol diet, and weight control  Please keep your appointments with your specialists as you may have planned

## 2019-02-12 NOTE — Progress Notes (Signed)
Patient ID: Bianca Campbell, female   DOB: [birth year], 63 y.o.   MRN: [id number]  Virtual Visit via Video Note  I connected with Bianca Campbell on 02/12/19 at 10:00 AM EDT by a video enabled telemedicine application and verified that I am speaking with the correct person using two identifiers. Pt is at home, I am in office, no other persons present   I discussed the limitations of evaluation and management by telemedicine and the availability of in person appointments. The patient expressed understanding and agreed to proceed.  History of Present Illness: Here to f/u recent Ed visit with CP, with neg troponin, cxr, ecg.  Tx with omeprazole 20 mg bid for 14 days.  States improved, but not resolved, still mild to moderate lower SSCP intermittent with occasional radiation to the back and left arm, but not assoc with diaphoresis, sob, n/v, palp or dizziness.  Pain is worse to bend forward as well as in the AM for several hours on first awakening from sleep despite HOB up.  Lat GXT sept 2018 neg for ischemia.  Certain foods can make it worse.  Denies worsening dysphagia,  bowel change or blood.  BP at home 122/84   Past Medical History:  Diagnosis Date  . ALLERGIC RHINITIS 09/11/2007  . Anemia, unspecified 08/31/2012  . Arthritis    "knees" (11/23/2016)  . BACK PAIN   . EXTERNAL OTITIS 09/11/2007  . GERD 06/06/2007  . GOUT 11/14/2007  . High cholesterol   . HYPERTENSION 06/06/2007  . INTERMITTENT VERTIGO 09/11/2007  . Irritable bowel syndrome 06/06/2007  . LOW BACK PAIN 06/06/2007  . OSTEOARTHRITIS 06/06/2007  . PONV (postoperative nausea and vomiting)   . Primary localized osteoarthritis of right knee   . SHOULDER PAIN, RIGHT 12/30/2008   Past Surgical History:  Procedure Laterality Date  . BILATERAL SALPINGOOPHORECTOMY    . CESAREAN SECTION  1987, 1991  . COLONOSCOPY     pt stated 2015 or 2016 unknown md  . EXPLORATORY LAPAROTOMY     bowel obstruction  . INGUINAL HERNIA REPAIR Bilateral     numerous hernia repairs 1996 to 2011  . JOINT REPLACEMENT    . KNEE ARTHROSCOPY Bilateral 2013 - 11/11/2015  . LAPAROSCOPIC CHOLECYSTECTOMY  1991  . s/p incarcerated hernia and ovary cyst Jan 2011  2011  . TOTAL KNEE ARTHROPLASTY Right 11/21/2016   Procedure: RIGHT TOTAL KNEE ARTHROPLASTY;  Surgeon: Salvatore Marvel, MD;  Location: [facility];  Service: Orthopedics;  Laterality: Right;  . UMBILICAL HERNIA REPAIR     numerous hernia repairs 1996 to 2011    reports that she has never smoked. She has never used smokeless tobacco. She reports that she does not drink alcohol or use drugs. family history includes Arthritis in her mother; Asthma in her daughter; Colon polyps (age of onset: 80) in her father; Diabetes in her mother; Healthy in her brother, daughter, sister, and son; Seizures in her mother. Allergies  Allergen Reactions  . Latex Itching   Current Outpatient Medications on File Prior to Visit  Medication Sig Dispense Refill  . amLODipine (NORVASC) 10 MG tablet TAKE 1 TABLET BY MOUTH ONCE DAILY 90 tablet 1  . BLACK CURRANT SEED OIL PO Take 1 tablet by mouth daily.    . cetirizine (ZYRTEC) 10 MG tablet Take 1 tablet (10 mg total) by mouth daily. 30 tablet 11  . Cholecalciferol (VITAMIN D3) 5000 units CAPS Take 5,000 Units by mouth daily.     . Cyanocobalamin (VITAMIN B 12  PO) Take 1 tablet by mouth daily.    . diclofenac (VOLTAREN) 75 MG EC tablet Take 75 mg by mouth daily as needed for mild pain.    Marland Kitchen. dicyclomine (BENTYL) 20 MG tablet TAKE ONE TABLET BY MOUTH THREE TIMES DAILY BEFORE MEAL(S) 50 tablet 1  . irbesartan (AVAPRO) 300 MG tablet TAKE 1 TABLET BY MOUTH ONCE DAILY 90 tablet 1  . meclizine (ANTIVERT) 12.5 MG tablet Take 1 tablet (12.5 mg total) by mouth 3 (three) times daily as needed for dizziness. 30 tablet 1  . meloxicam (MOBIC) 15 MG tablet Take 1 tablet (15 mg total) by mouth daily as needed for pain. 90 tablet 1  . omeprazole (PRILOSEC) 20 MG capsule Take 1 capsule (20 mg  total) by mouth 2 (two) times daily before a meal for 14 days. 28 capsule 0  . ondansetron (ZOFRAN) 4 MG tablet Take 1 tablet (4 mg total) by mouth every 8 (eight) hours as needed for nausea or vomiting. 30 tablet 1  . polyethylene glycol (MIRALAX / GLYCOLAX) packet 17grams in 6 oz of water twice a day until bowel movement.  LAXITIVE.  Restart if two days since last bowel movement (Patient not taking: Reported on 01/16/2019) 14 each 0   No current facility-administered medications on file prior to visit.    Observations/Objective: Alert, NAD, resps normal, comfortable appearing, cn 2-12 intact, moves all 4s, appropriate mood and affect, no visible rash or swelling Lab Results  Component Value Date   WBC 6.5 01/16/2019   HGB 13.0 01/16/2019   HCT 42.5 01/16/2019   PLT 374 01/16/2019   GLUCOSE 138 (H) 01/16/2019   CHOL 185 06/20/2018   TRIG 66.0 06/20/2018   HDL 71.00 06/20/2018   LDLDIRECT 114.4 03/13/2013   LDLCALC 101 (H) 06/20/2018   ALT 15 01/16/2019   AST 19 01/16/2019   NA 138 01/16/2019   K 3.4 (L) 01/16/2019   CL 102 01/16/2019   CREATININE 0.59 01/16/2019   BUN 9 01/16/2019   CO2 26 01/16/2019   TSH 2.22 06/20/2018   INR 1.06 11/10/2016   HGBA1C 6.1 (A) 12/19/2018   Assessment and Plan: See notes  Follow Up Instructions: See notes  I discussed the assessment and treatment plan with the patient. The patient was provided an opportunity to ask questions and all were answered. The patient agreed with the plan and demonstrated an understanding of the instructions.   The patient was advised to call back or seek an in-person evaluation if the symptoms worsen or if the condition fails to improve as anticipated.  Oliver BarreJames Arien Benincasa, MD

## 2019-04-09 ENCOUNTER — Encounter: Payer: Self-pay | Admitting: Family Medicine

## 2019-04-09 ENCOUNTER — Encounter: Payer: Self-pay | Admitting: Internal Medicine

## 2019-04-09 DIAGNOSIS — K219 Gastro-esophageal reflux disease without esophagitis: Secondary | ICD-10-CM

## 2019-04-24 ENCOUNTER — Telehealth: Payer: Self-pay

## 2019-04-24 NOTE — Telephone Encounter (Signed)
Covid-19 screening questions   Do you now or have you had a fever in the last 14 days? No  Do you have any respiratory symptoms of shortness of breath or cough now or in the last 14 days? No  Do you have any family members or close contacts with diagnosed or suspected Covid-19 in the past 14 days? No  Have you been tested for Covid-19 and found to be positive? no        

## 2019-04-25 ENCOUNTER — Encounter: Payer: Self-pay | Admitting: Nurse Practitioner

## 2019-04-25 ENCOUNTER — Other Ambulatory Visit: Payer: Self-pay

## 2019-04-25 ENCOUNTER — Ambulatory Visit: Payer: 59 | Admitting: Nurse Practitioner

## 2019-04-25 VITALS — BP 134/90 | HR 73 | Temp 97.9°F | Ht 59.5 in | Wt 212.1 lb

## 2019-04-25 DIAGNOSIS — R079 Chest pain, unspecified: Secondary | ICD-10-CM | POA: Diagnosis not present

## 2019-04-25 NOTE — Patient Instructions (Signed)
You have been scheduled for an endoscopy. Please follow written instructions given to you at your visit today. If you use inhalers (even only as needed), please bring them with you on the day of your procedure.   

## 2019-04-25 NOTE — Progress Notes (Addendum)
ASSESSMENT / PLAN:      [name] WITH CHEST PAIN - REFRACTORY TO > A MONTH OF PPI THERAPY PLUS DIETARY CHANGES. NEGATIVE TROPONIN, EKG AND CXR IN ED IN April. . LOW RISK STRESS NUCLEAR STUDY IN SEPT 2018. CHEST PAIN SECONDARY TO GERD?    FUNCTIONAL CHEST PAIN? CARDIAC ETIOLOGY SEEMS UNLIKELY.  -EGD probably low yield but given persistent symptoms will proceed with endoscopic work-up.The risks and benefits of EGD were discussed and the patient agrees to proceed.  -Patient does not want to resume PPI due to concern for side effects and lack of benefit. -If EGD negative consider 24-hour pH/manometry study and/or trial of Desipramine  Addendum: Reviewed and agree with assessment and management plan. Pyrtle, Carie CaddyJay M, MD     HPI:    Chief Complaint:   GERD  Patient is referred by PCP Dr. 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  She saw  Dr. Rhea BeltonPyrtle last in June 2017 for evaluation of epigastric pain, early satiety and anemia.  Findings suggested gastroparesis, see below  Patient had a telehealth visit with PCP 02/12/2019 following ED visit 4/1 for chest pain.  Troponin, chest x-ray and EKG were unremarkable.  She would take an acid blocker occasionally as needed but the ED put her on daily PPI for 2 weeks . She made dietary changes such as elimination of fried foods and onions. . She began eating more fruits and vegetables.   Without improvement patient contacted her PCP who put her on pantoprazole.  After 3 weeks on pantoprazole and absolutely no improvement in symptoms patient referred here.  Since PPI didn't help she discontinued it. Learning about possible side effects of PPI she doesn't want to resume it. Yesterday she started drinking a mixture of celery powder and water, so far it seems to be helping  Bianca Campbell describes chest pain as intermittent  throughout the day. Chest feels heavy, it burns and sometimes the pain feels like a bad spasm. Taking a deep breath causes a full sensation in her throat.  Her symptoms aren't related to eating nor are they related to activity / exertion. She has no associated shortness of breath.  Years ago patient had similar symptoms but they were not occurring on a daily basis like they are now.  She recalls seeing Cardiologist Dr. Sharyn LullHarwani for evaluation but everything checked out okay.   Previous GI studies: EGD 04/12/2016 Normal mucosa was found in the entire esophagus. - Widely patent Schatzki ring. - Retained gastric fluid raising possibility of gastroparesis. Fluid aspiration performed. - Small hiatal hernia. - Normal mucosa was found in the entire stomach. Biopsied. - Normal examined duodenum.  Data Reviewed:  ED visit 01/16/2019 Potassium 3.4, normal renal function, Normal liver function Total protein elevated at 9.1  Past Medical History:  Diagnosis Date  . ALLERGIC RHINITIS 09/11/2007  . Anemia, unspecified 08/31/2012  . Arthritis    "knees" (11/23/2016)  . BACK PAIN   . EXTERNAL OTITIS 09/11/2007  . GERD 06/06/2007  . GOUT 11/14/2007  . High cholesterol   . HYPERTENSION 06/06/2007  . INTERMITTENT VERTIGO 09/11/2007  . Irritable bowel syndrome 06/06/2007  . LOW BACK PAIN 06/06/2007  . OSTEOARTHRITIS 06/06/2007  . PONV (postoperative nausea and vomiting)   . Primary localized osteoarthritis of right knee   .  SHOULDER PAIN, RIGHT 12/30/2008     Past Surgical History:  Procedure Laterality Date  . BILATERAL SALPINGOOPHORECTOMY    . CESAREAN SECTION  1987, 1991  . COLONOSCOPY     pt stated 2015 or 2016 unknown md  . EXPLORATORY LAPAROTOMY     bowel obstruction  . INGUINAL HERNIA REPAIR Bilateral    numerous hernia repairs 1996 to 2011  . JOINT REPLACEMENT    . KNEE ARTHROSCOPY Bilateral 2013 - 11/11/2015  . LAPAROSCOPIC CHOLECYSTECTOMY  1991  . s/p incarcerated hernia and ovary cyst  Jan 2011  2011  . TOTAL KNEE ARTHROPLASTY Right 11/21/2016   Procedure: RIGHT TOTAL KNEE ARTHROPLASTY;  Surgeon: Salvatore Marvelobert Wainer, MD;  [facility];  Service: Orthopedics;  Laterality: Right;  . UMBILICAL HERNIA REPAIR     numerous hernia repairs 1996 to 2011   Family History  Problem Relation Age of Onset  . Arthritis Mother        RA  . Diabetes Mother   . Seizures Mother   . Colon polyps Father 268  . Healthy Sister   . Healthy Brother   . Healthy Son   . Healthy Daughter   . Asthma Daughter   . Colon cancer Neg Hx    Social History   Tobacco Use  . Smoking status: Never Smoker  . Smokeless tobacco: Never Used  Substance Use Topics  . Alcohol use: No  . Drug use: No   Current Outpatient Medications  Medication Sig Dispense Refill  . AMBULATORY NON FORMULARY MEDICATION daily. Celery powder. Take once daily before a meal in the morning. Appox. 1 tablespoon    . amLODipine (NORVASC) 10 MG tablet TAKE 1 TABLET BY MOUTH ONCE DAILY 90 tablet 1  . BLACK CURRANT SEED OIL PO Take 1 tablet by mouth daily as needed.     . cetirizine (ZYRTEC) 10 MG tablet Take 1 tablet (10 mg total) by mouth daily. 30 tablet 11  . Cholecalciferol (VITAMIN D3) 5000 units CAPS Take 5,000 Units by mouth daily.     . Cyanocobalamin (VITAMIN B 12 PO) Take 1 tablet by mouth daily.    . Digestive Enzyme CAPS Take 1 capsule by mouth 3 (three) times daily.    . irbesartan (AVAPRO) 300 MG tablet TAKE 1 TABLET BY MOUTH ONCE DAILY 90 tablet 1  . Licorice, Glycyrrhiza glabra, (LICORICE PO) Take 1 capsule by mouth 3 (three) times daily. Will switch from capsules to chews depending. Will take 3 at the end of meal if taking the chews    . meloxicam (MOBIC) 15 MG tablet Take 1 tablet (15 mg total) by mouth daily as needed for pain. 90 tablet 1  . pantoprazole (PROTONIX) 40 MG tablet Take 1 tablet (40 mg total) by mouth 2 (two) times daily. (Patient taking differently: Take 40 mg by mouth as needed. ) 180 tablet 3  .  diclofenac (VOLTAREN) 75 MG EC tablet Take 75 mg by mouth daily as needed for mild pain.    Marland Kitchen. dicyclomine (BENTYL) 20 MG tablet TAKE ONE TABLET BY MOUTH THREE TIMES DAILY BEFORE MEAL(S) (Patient not taking: Reported on 04/25/2019) 50 tablet 1  . famotidine (PEPCID) 20 MG tablet Take 1 tablet (20 mg total) by mouth at bedtime. (Patient not taking: Reported on 04/25/2019) 90 tablet 3  . meclizine (ANTIVERT) 12.5 MG tablet Take 1 tablet (12.5 mg total) by mouth 3 (three) times daily as needed for dizziness. (Patient not taking: Reported on 04/25/2019) 30 tablet 1  .  ondansetron (ZOFRAN) 4 MG tablet Take 1 tablet (4 mg total) by mouth every 8 (eight) hours as needed for nausea or vomiting. (Patient not taking: Reported on 04/25/2019) 30 tablet 1   No current facility-administered medications for this visit.    Allergies  Allergen Reactions  . Latex Itching     Review of Systems: All systems reviewed and negative except where noted in HPI.   Creatinine clearance cannot be calculated (Patient's most recent lab result is older than the maximum 21 days allowed.)   Physical Exam:    Wt Readings from Last 3 Encounters:  04/25/19 212 lb 2 oz (96.2 kg)  01/16/19 217 lb (98.4 kg)  12/19/18 220 lb (99.8 kg)    BP 134/90   Pulse 73   Temp 97.9 F (36.6 C)   Ht 4' 11.5" (1.511 m)   Wt 212 lb 2 oz (96.2 kg)   BMI 42.13 kg/m  Constitutional:  Pleasant female in no acute distress. Psychiatric: Normal mood and affect. Behavior is normal. EENT: Pupils normal.  Conjunctivae are normal. No scleral icterus. Neck supple.  Cardiovascular: Normal rate, regular rhythm. No edema Pulmonary/chest: Effort normal and breath sounds normal. No wheezing, rales or rhonchi. Abdominal: Soft, nondistended, nontender. Bowel sounds active throughout. There are no masses palpable. No hepatomegaly. Neurological: Alert and oriented to person place and time. Musculoskeletal: no chest wall tenderness Skin: Skin is warm and  dry. No rashes noted.  Tye Savoy, NP  04/25/2019, 10:48 AM  Cc: Biagio Borg, MD

## 2019-05-07 ENCOUNTER — Telehealth: Payer: Self-pay | Admitting: Internal Medicine

## 2019-05-07 NOTE — Telephone Encounter (Signed)
Spoke with patient she answered no to all questions °

## 2019-05-07 NOTE — Telephone Encounter (Signed)

## 2019-05-08 ENCOUNTER — Ambulatory Visit (AMBULATORY_SURGERY_CENTER): Payer: 59 | Admitting: Internal Medicine

## 2019-05-08 ENCOUNTER — Other Ambulatory Visit: Payer: Self-pay

## 2019-05-08 ENCOUNTER — Encounter: Payer: Self-pay | Admitting: Internal Medicine

## 2019-05-08 VITALS — BP 152/73 | HR 65 | Temp 98.2°F | Resp 10 | Ht 59.5 in | Wt 212.0 lb

## 2019-05-08 DIAGNOSIS — K228 Other specified diseases of esophagus: Secondary | ICD-10-CM | POA: Diagnosis not present

## 2019-05-08 DIAGNOSIS — K222 Esophageal obstruction: Secondary | ICD-10-CM | POA: Diagnosis not present

## 2019-05-08 DIAGNOSIS — K449 Diaphragmatic hernia without obstruction or gangrene: Secondary | ICD-10-CM | POA: Diagnosis not present

## 2019-05-08 DIAGNOSIS — R079 Chest pain, unspecified: Secondary | ICD-10-CM

## 2019-05-08 MED ORDER — SODIUM CHLORIDE 0.9 % IV SOLN: 500.0000 mL | Freq: Once | INTRAVENOUS | Status: DC

## 2019-05-08 NOTE — Patient Instructions (Signed)
Discharge instructions given. Biopsies taken. Handout on hiatal hernia. Resume previous medications. YOU HAD AN ENDOSCOPIC PROCEDURE TODAY AT [facility]:   Refer to the procedure report that was given to you for any specific questions about what was found during the examination.  If the procedure report does not answer your questions, please call your gastroenterologist to clarify.  If you requested that your care partner not be given the details of your procedure findings, then the procedure report has been included in a sealed envelope for you to review at your convenience later.  YOU SHOULD EXPECT: Some feelings of bloating in the abdomen. Passage of more gas than usual.  Walking can help get rid of the air that was put into your GI tract during the procedure and reduce the bloating. If you had a lower endoscopy (such as a colonoscopy or flexible sigmoidoscopy) you may notice spotting of blood in your stool or on the toilet paper. If you underwent a bowel prep for your procedure, you may not have a normal bowel movement for a few days.  Please Note:  You might notice some irritation and congestion in your nose or some drainage.  This is from the oxygen used during your procedure.  There is no need for concern and it should clear up in a day or so.  SYMPTOMS TO REPORT IMMEDIATELY:    Following upper endoscopy (EGD)  Vomiting of blood or coffee ground material  New chest pain or pain under the shoulder blades  Painful or persistently difficult swallowing  New shortness of breath  Fever of 100F or higher  Black, tarry-looking stools  For urgent or emergent issues, a gastroenterologist can be reached at any hour by calling [phone].   DIET:  We do recommend a small meal at first, but then you may proceed to your regular diet.  Drink plenty of fluids but you should avoid alcoholic beverages for 24 hours.  ACTIVITY:  You should plan to take it easy for the rest of  today and you should NOT DRIVE or use heavy machinery until tomorrow (because of the sedation medicines used during the test).    FOLLOW UP: Our staff will call the number listed on your records 48-72 hours following your procedure to check on you and address any questions or concerns that you may have regarding the information given to you following your procedure. If we do not reach you, we will leave a message.  We will attempt to reach you two times.  During this call, we will ask if you have developed any symptoms of COVID 19. If you develop any symptoms (ie: fever, flu-like symptoms, shortness of breath, cough etc.) before then, please call [phone].  If you test positive for Covid 19 in the 2 weeks post procedure, please call and report this information to [place].    If any biopsies were taken you will be contacted by phone or by letter within the next 1-3 weeks.  Please call us at [phone] if you have not heard about the biopsies in 3 weeks.    SIGNATURES/CONFIDENTIALITY: You and/or your care partner have signed paperwork which will be entered into your electronic medical record.  These signatures attest to the fact that that the information above on your After Visit Summary has been reviewed and is understood.  Full responsibility of the confidentiality of this discharge information lies with you and/or your care-partner.

## 2019-05-08 NOTE — Progress Notes (Signed)
Report to PACU, RN, vss, BBS= Clear.  

## 2019-05-08 NOTE — Progress Notes (Signed)
Called to room to assist during endoscopic procedure.  Patient ID and intended procedure confirmed with present staff. Received instructions for my participation in the procedure from the performing physician.  

## 2019-05-08 NOTE — Progress Notes (Signed)
Temp taken by CW VS taken by JB 

## 2019-05-08 NOTE — Op Note (Signed)
[facility] Patient Name: Bianca Campbell [facility] Procedure Date: 05/08/2019 10:21 AM MRN: [id number] Endoscopist: Beverley FiedlerJay M Brynna Dobos , MD Age: [age] Referring MD:  Date of Birth: 01-Sep-1956 Gender: Female Account #: [id number] Procedure:                Upper GI endoscopy Indications:              Unexplained chest pain Medicines:                Monitored Anesthesia Care Procedure:                Pre-Anesthesia Assessment:                           - Prior to the procedure, a History and Physical                            was performed, and patient medications and                            allergies were reviewed. The patient's tolerance of                            previous anesthesia was also reviewed. The risks                            and benefits of the procedure and the sedation                            options and risks were discussed with the patient.                            All questions were answered, and informed consent                            was obtained. Prior Anticoagulants: The patient has                            taken no previous anticoagulant or antiplatelet                            agents. ASA Grade Assessment: II - A patient with                            mild systemic disease. After reviewing the risks                            and benefits, the patient was deemed in                            satisfactory condition to undergo the procedure.                           After obtaining informed consent, the endoscope was  passed under direct vision. Throughout the                            procedure, the patient's blood pressure, pulse, and                            oxygen saturations were monitored continuously. The                            Endoscope was introduced through the mouth, and                            advanced to the second part of duodenum. The upper                            GI endoscopy was accomplished  without difficulty.                            The patient tolerated the procedure well. Scope In: Scope Out: Findings:                 Normal mucosa was found in the entire esophagus.                            Biopsies were obtained from the proximal and distal                            esophagus with cold forceps for histology to                            exclude eosinophilic esophagitis.                           A 2 cm hiatal hernia was present.                           A non-obstructing Schatzki ring was found at the                            gastroesophageal junction.                           The entire examined stomach was normal.                           The examined duodenum was normal. Complications:            No immediate complications. Estimated Blood Loss:     Estimated blood loss was minimal. Impression:               - Normal mucosa was found in the entire esophagus.                            Biopsied.                           -  2 cm hiatal hernia.                           - Non-obstructing Schatzki ring.                           - Normal stomach.                           - Normal examined duodenum. Recommendation:           - Patient has a contact number available for                            emergencies. The signs and symptoms of potential                            delayed complications were discussed with the                            patient. Return to normal activities tomorrow.                            Written discharge instructions were provided to the                            patient.                           - Resume previous diet.                           - Continue present medications.                           - Await pathology results.                           - Proceed with esophageal manometry and 24 hour pH                            + impedence testing to exclude GERD and esophageal                            spasm as rational  for chest pain. Beverley FiedlerJay M Adryan Shin, MD 05/08/2019 10:51:51 AM This report has been signed electronically.

## 2019-05-09 ENCOUNTER — Other Ambulatory Visit: Payer: Self-pay

## 2019-05-09 ENCOUNTER — Telehealth: Payer: Self-pay

## 2019-05-09 DIAGNOSIS — K219 Gastro-esophageal reflux disease without esophagitis: Secondary | ICD-10-CM

## 2019-05-09 DIAGNOSIS — R079 Chest pain, unspecified: Secondary | ICD-10-CM

## 2019-05-09 NOTE — Telephone Encounter (Signed)
Pt scheduled for covid testing 8/6@9 :30am at [facility], EM with 24 hour ph/Impedence scheduled at [facility] 05/27/19@8 :30am. Pt aware of appts and instructions mailed to pt.

## 2019-05-10 ENCOUNTER — Telehealth: Payer: Self-pay

## 2019-05-10 NOTE — Telephone Encounter (Signed)
  Follow up Call-  Call back number 05/08/2019  Post procedure Call Back phone  # [phone]  Permission to leave phone message Yes  Some recent data might be hidden     Patient questions:  Do you have a fever, pain , or abdominal swelling? No. Pain Score  0 *  Have you tolerated food without any problems? Yes.    Have you been able to return to your normal activities? Yes.    Do you have any questions about your discharge instructions: Diet   No. Medications  No. Follow up visit  No.  Do you have questions or concerns about your Care? Yes.    Discussed hiatal hernia questions  Actions: * If pain score is 4 or above: No action needed, pain <4.  1. Have you developed a fever since your procedure? no  2.   Have you had an respiratory symptoms (SOB or cough) since your procedure? no  3.   Have you tested positive for COVID 19 since your procedure no  4.   Have you had any family members/close contacts diagnosed with the COVID 19 since your procedure?  no   If yes to any of these questions please route to Joylene John, RN and Alphonsa Gin, Therapist, sports.

## 2019-05-11 ENCOUNTER — Encounter: Payer: Self-pay | Admitting: Internal Medicine

## 2019-05-23 ENCOUNTER — Telehealth: Payer: Self-pay | Admitting: Internal Medicine

## 2019-05-23 ENCOUNTER — Other Ambulatory Visit (HOSPITAL_COMMUNITY)
Admission: RE | Admit: 2019-05-23 | Discharge: 2019-05-23 | Disposition: A | Discharge status: Home / Self Care | Payer: 59 | Source: Ambulatory Visit | Attending: Internal Medicine | Admitting: Internal Medicine

## 2019-05-23 DIAGNOSIS — Z20828 Contact with and (suspected) exposure to other viral communicable diseases: Secondary | ICD-10-CM | POA: Insufficient documentation

## 2019-05-23 DIAGNOSIS — Z01812 Encounter for preprocedural laboratory examination: Secondary | ICD-10-CM | POA: Diagnosis not present

## 2019-05-23 LAB — SARS CORONAVIRUS 2 (TAT 6-24 HRS): SARS Coronavirus 2: NEGATIVE

## 2019-05-23 NOTE — Telephone Encounter (Signed)
Pt called with questions about her EM with ph probe test that is scheduled for next week. Discussed procedure with pt, she wanted to know if she would have a tube in her nose when she left , let her know she would have a probe in place. Questions answered for pt.

## 2019-05-23 NOTE — Telephone Encounter (Signed)
Left message for pt to call back  °

## 2019-05-27 ENCOUNTER — Encounter (HOSPITAL_COMMUNITY)
Admission: RE | Disposition: A | Discharge status: Home / Self Care | Payer: Self-pay | Source: Home / Self Care | Attending: Internal Medicine

## 2019-05-27 ENCOUNTER — Ambulatory Visit (HOSPITAL_COMMUNITY)
Admission: RE | Admit: 2019-05-27 | Discharge: 2019-05-27 | Disposition: A | Discharge status: Home / Self Care | Payer: 59 | Attending: Internal Medicine | Admitting: Internal Medicine

## 2019-05-27 DIAGNOSIS — R079 Chest pain, unspecified: Secondary | ICD-10-CM | POA: Insufficient documentation

## 2019-05-27 DIAGNOSIS — K219 Gastro-esophageal reflux disease without esophagitis: Secondary | ICD-10-CM | POA: Diagnosis not present

## 2019-05-27 DIAGNOSIS — R053 Chronic cough: Secondary | ICD-10-CM

## 2019-05-27 DIAGNOSIS — R12 Heartburn: Secondary | ICD-10-CM | POA: Diagnosis not present

## 2019-05-27 DIAGNOSIS — R05 Cough: Secondary | ICD-10-CM

## 2019-05-27 HISTORY — PX: ESOPHAGEAL MANOMETRY: SHX5429

## 2019-05-27 HISTORY — PX: 24 HOUR PH STUDY: SHX5419

## 2019-05-27 SURGERY — MONITORING, ESOPHAGEAL PH, 24 HOUR
Anesthesia: Topical

## 2019-05-27 MED ORDER — LIDOCAINE VISCOUS HCL 2 % MT SOLN
OROMUCOSAL | Status: AC
  Filled 2019-05-27: qty 15

## 2019-05-27 SURGICAL SUPPLY — 2 items
FACESHIELD LNG OPTICON STERILE (SAFETY) IMPLANT
GLOVE BIO SURGEON STRL SZ8 (GLOVE) ×6 IMPLANT

## 2019-05-27 NOTE — Progress Notes (Deleted)
Error

## 2019-05-27 NOTE — Progress Notes (Addendum)
Esophageal manometry per protocol. Ph probe placed at 30 cm at 1107 per protocol. Education given. Patient verbalized understanding.  Will return on 05/28/2019 at 1107 for probe removal. Patient tolerated both procedures well.

## 2019-05-28 ENCOUNTER — Encounter (HOSPITAL_COMMUNITY): Payer: Self-pay | Admitting: Internal Medicine

## 2019-05-28 DIAGNOSIS — R05 Cough: Secondary | ICD-10-CM

## 2019-05-28 DIAGNOSIS — R053 Chronic cough: Secondary | ICD-10-CM

## 2019-05-31 ENCOUNTER — Other Ambulatory Visit: Payer: Self-pay

## 2019-05-31 MED ORDER — AMITRIPTYLINE HCL 50 MG PO TABS: ORAL_TABLET | ORAL | 3 refills | Status: DC

## 2019-06-21 ENCOUNTER — Other Ambulatory Visit: Payer: Self-pay | Admitting: Internal Medicine

## 2019-06-21 ENCOUNTER — Other Ambulatory Visit (INDEPENDENT_AMBULATORY_CARE_PROVIDER_SITE_OTHER): Payer: 59

## 2019-06-21 ENCOUNTER — Encounter: Payer: Self-pay | Admitting: Internal Medicine

## 2019-06-21 ENCOUNTER — Other Ambulatory Visit: Payer: Self-pay

## 2019-06-21 ENCOUNTER — Ambulatory Visit (INDEPENDENT_AMBULATORY_CARE_PROVIDER_SITE_OTHER): Payer: 59 | Admitting: Internal Medicine

## 2019-06-21 VITALS — BP 126/84 | HR 89 | Temp 98.5°F | Ht 59.5 in | Wt 218.0 lb

## 2019-06-21 DIAGNOSIS — Z0001 Encounter for general adult medical examination with abnormal findings: Secondary | ICD-10-CM

## 2019-06-21 DIAGNOSIS — E538 Deficiency of other specified B group vitamins: Secondary | ICD-10-CM

## 2019-06-21 DIAGNOSIS — E785 Hyperlipidemia, unspecified: Secondary | ICD-10-CM

## 2019-06-21 DIAGNOSIS — E611 Iron deficiency: Secondary | ICD-10-CM

## 2019-06-21 DIAGNOSIS — R779 Abnormality of plasma protein, unspecified: Secondary | ICD-10-CM

## 2019-06-21 DIAGNOSIS — R739 Hyperglycemia, unspecified: Secondary | ICD-10-CM

## 2019-06-21 DIAGNOSIS — E559 Vitamin D deficiency, unspecified: Secondary | ICD-10-CM | POA: Diagnosis not present

## 2019-06-21 DIAGNOSIS — R079 Chest pain, unspecified: Secondary | ICD-10-CM

## 2019-06-21 DIAGNOSIS — E8809 Other disorders of plasma-protein metabolism, not elsewhere classified: Secondary | ICD-10-CM

## 2019-06-21 DIAGNOSIS — I10 Essential (primary) hypertension: Secondary | ICD-10-CM | POA: Diagnosis not present

## 2019-06-21 DIAGNOSIS — Z0000 Encounter for general adult medical examination without abnormal findings: Secondary | ICD-10-CM | POA: Diagnosis not present

## 2019-06-21 LAB — LIPID PANEL
Cholesterol: 193 mg/dL (ref 0–200)
HDL: 78.5 mg/dL (ref >39.00)
LDL Cholesterol: 106 mg/dL — ABNORMAL HIGH (ref 0–99)
NonHDL: 114.99
Total CHOL/HDL Ratio: 2
Triglycerides: 47 mg/dL (ref 0.0–149.0)
VLDL: 9.4 mg/dL (ref 0.0–40.0)

## 2019-06-21 LAB — URINALYSIS, ROUTINE W REFLEX MICROSCOPIC
Bilirubin Urine: NEGATIVE
Hgb urine dipstick: NEGATIVE
Ketones, ur: NEGATIVE
Leukocytes,Ua: NEGATIVE
Nitrite: NEGATIVE
RBC / HPF: NONE SEEN (ref 0–?)
Specific Gravity, Urine: 1.015 (ref 1.000–1.030)
Total Protein, Urine: NEGATIVE
Urine Glucose: NEGATIVE
Urobilinogen, UA: 1 (ref 0.0–1.0)
WBC, UA: NONE SEEN (ref 0–?)
pH: 6.5 (ref 5.0–8.0)

## 2019-06-21 LAB — BASIC METABOLIC PANEL
BUN: 10 mg/dL (ref 6–23)
CO2: 28 mEq/L (ref 19–32)
Calcium: 9.6 mg/dL (ref 8.4–10.5)
Chloride: 101 mEq/L (ref 96–112)
Creatinine, Ser: 0.56 mg/dL (ref 0.40–1.20)
GFR: 132.63 mL/min (ref >60.00)
Glucose, Bld: 87 mg/dL (ref 70–99)
Potassium: 3.5 mEq/L (ref 3.5–5.1)
Sodium: 138 mEq/L (ref 135–145)

## 2019-06-21 LAB — IBC PANEL
Iron: 102 ug/dL (ref 42–145)
Saturation Ratios: 25.7 % (ref 20.0–50.0)
Transferrin: 283 mg/dL (ref 212.0–360.0)

## 2019-06-21 LAB — CBC WITH DIFFERENTIAL/PLATELET
Basophils Absolute: 0.1 10*3/uL (ref 0.0–0.1)
Basophils Relative: 1 % (ref 0.0–3.0)
Eosinophils Absolute: 0.3 10*3/uL (ref 0.0–0.7)
Eosinophils Relative: 5.8 % — ABNORMAL HIGH (ref 0.0–5.0)
HCT: 37.4 % (ref 36.0–46.0)
Hemoglobin: 12.3 g/dL (ref 12.0–15.0)
Lymphocytes Relative: 52.1 % — ABNORMAL HIGH (ref 12.0–46.0)
Lymphs Abs: 2.7 10*3/uL (ref 0.7–4.0)
MCHC: 32.8 g/dL (ref 30.0–36.0)
MCV: 81.1 fl (ref 78.0–100.0)
Monocytes Absolute: 0.3 10*3/uL (ref 0.1–1.0)
Monocytes Relative: 5.7 % (ref 3.0–12.0)
Neutro Abs: 1.8 10*3/uL (ref 1.4–7.7)
Neutrophils Relative %: 35.4 % — ABNORMAL LOW (ref 43.0–77.0)
Platelets: 306 10*3/uL (ref 150.0–400.0)
RBC: 4.61 Mil/uL (ref 3.87–5.11)
RDW: 13.8 % (ref 11.5–15.5)
WBC: 5.2 10*3/uL (ref 4.0–10.5)

## 2019-06-21 LAB — HEPATIC FUNCTION PANEL
ALT: 15 U/L (ref 0–35)
AST: 16 U/L (ref 0–37)
Albumin: 4.3 g/dL (ref 3.5–5.2)
Alkaline Phosphatase: 89 U/L (ref 39–117)
Bilirubin, Direct: 0.1 mg/dL (ref 0.0–0.3)
Total Bilirubin: 1.1 mg/dL (ref 0.2–1.2)
Total Protein: 8.5 g/dL — ABNORMAL HIGH (ref 6.0–8.3)

## 2019-06-21 LAB — VITAMIN D 25 HYDROXY (VIT D DEFICIENCY, FRACTURES): VITD: 23.65 ng/mL — ABNORMAL LOW (ref 30.00–100.00)

## 2019-06-21 LAB — VITAMIN B12: Vitamin B-12: 1178 pg/mL — ABNORMAL HIGH (ref 211–911)

## 2019-06-21 LAB — TSH: TSH: 2.02 u[IU]/mL (ref 0.35–4.50)

## 2019-06-21 LAB — HEMOGLOBIN A1C: Hgb A1c MFr Bld: 6.6 % — ABNORMAL HIGH (ref 4.6–6.5)

## 2019-06-21 MED ORDER — VITAMIN D (ERGOCALCIFEROL) 1.25 MG (50000 UNIT) PO CAPS: 50000.0000 [IU] | ORAL_CAPSULE | ORAL | 0 refills | Status: DC

## 2019-06-21 NOTE — Patient Instructions (Signed)

## 2019-06-21 NOTE — Progress Notes (Signed)
Subjective:    Patient ID: Bianca Campbell, female    DOB: [birth year], 63 y.o.   MRN: [id number]  HPI  Here for wellness and f/u;  Overall doing ok;  Pt denies worsening SOB, DOE, wheezing, orthopnea, PND, worsening LE edema, palpitations, dizziness or syncope.  Pt denies neurological change such as new headache, facial or extremity weakness.  Pt denies polydipsia, polyuria, or low sugar symptoms. Pt states overall good compliance with treatment and medications, good tolerability, and has been trying to follow appropriate diet.  Pt denies worsening depressive symptoms, suicidal ideation or panic. No fever, night sweats, wt loss, loss of appetite, or other constitutional symptoms.  Pt states good ability with ADL's, has low fall risk, home safety reviewed and adequate, no other significant changes in hearing or vision, and only occasionally active with exercise. Also c/o 2 wks onset intermittent mild upper sternal and left parasternal, worse in the AM, better in the PM, no tender or sore spots but is pleuritic Past Medical History:  Diagnosis Date  . ALLERGIC RHINITIS 09/11/2007  . Anemia, unspecified 08/31/2012  . Arthritis    "knees" (11/23/2016)  . BACK PAIN   . EXTERNAL OTITIS 09/11/2007  . GERD 06/06/2007  . GOUT 11/14/2007  . High cholesterol   . HYPERTENSION 06/06/2007  . INTERMITTENT VERTIGO 09/11/2007  . Irritable bowel syndrome 06/06/2007  . LOW BACK PAIN 06/06/2007  . OSTEOARTHRITIS 06/06/2007  . PONV (postoperative nausea and vomiting)   . Primary localized osteoarthritis of right knee   . SHOULDER PAIN, RIGHT 12/30/2008   Past Surgical History:  Procedure Laterality Date  . [place] STUDY N/A 05/27/2019   Procedure: [place] STUDY;  Surgeon: Jerene Bears, MD;  Location: [facility];  Service: Gastroenterology;  Laterality: N/A;  . BILATERAL SALPINGOOPHORECTOMY    . [place]  . COLONOSCOPY     pt stated 2015 or 2016 unknown md  . ESOPHAGEAL MANOMETRY  N/A 05/27/2019   Procedure: ESOPHAGEAL MANOMETRY (EM) WITH IMPEDENCE STUDY;  Surgeon: Jerene Bears, MD;  Location: [facility];  Service: Gastroenterology;  Laterality: N/A;  . EXPLORATORY LAPAROTOMY     bowel obstruction  . INGUINAL HERNIA REPAIR Bilateral    numerous hernia repairs 1996 to 2011  . JOINT REPLACEMENT    . KNEE ARTHROSCOPY Bilateral 2013 - 11/11/2015  . LAPAROSCOPIC CHOLECYSTECTOMY  1991  . s/p incarcerated hernia and ovary cyst Jan 2011  2011  . TOTAL KNEE ARTHROPLASTY Right 11/21/2016   Procedure: RIGHT TOTAL KNEE ARTHROPLASTY;  Surgeon: Elsie Saas, MD;  Location: [place];  Service: Orthopedics;  Laterality: Right;  . UMBILICAL HERNIA REPAIR     numerous hernia repairs 1996 to 2011    reports that she has never smoked. She has never used smokeless tobacco. She reports that she does not drink alcohol or use drugs. family history includes Arthritis in her mother; Asthma in her daughter; Colon polyps (age of onset: 80) in her father; Diabetes in her mother; Healthy in her brother, daughter, sister, and son; Seizures in her mother. Allergies  Allergen Reactions  . Latex Itching   Current Outpatient Medications on File Prior to Visit  Medication Sig Dispense Refill  . AMBULATORY NON FORMULARY MEDICATION daily. Celery powder. Take once daily before a meal in the morning. Appox. 1 tablespoon    . amitriptyline (ELAVIL) 50 MG tablet Take 1/2 tablet by mouth at bedtime for 7 days. If tolerating increase to a whole tablet at  bedtime 30 tablet 3  . amLODipine (NORVASC) 10 MG tablet TAKE 1 TABLET BY MOUTH ONCE DAILY 90 tablet 1  . BLACK CURRANT SEED OIL PO Take 1 tablet by mouth daily as needed.     . Cholecalciferol (VITAMIN D3) 5000 units CAPS Take 5,000 Units by mouth daily.     . Cyanocobalamin (VITAMIN B 12 PO) Take 1 tablet by mouth daily.    . diclofenac (VOLTAREN) 75 MG EC tablet Take 75 mg by mouth daily as needed for mild pain.    Marland Kitchen dicyclomine (BENTYL) 20 MG tablet TAKE  ONE TABLET BY MOUTH THREE TIMES DAILY BEFORE MEAL(S) 50 tablet 1  . Digestive Enzyme CAPS Take 1 capsule by mouth 3 (three) times daily.    . famotidine (PEPCID) 20 MG tablet Take 1 tablet (20 mg total) by mouth at bedtime. 90 tablet 3  . irbesartan (AVAPRO) 300 MG tablet TAKE 1 TABLET BY MOUTH ONCE DAILY 90 tablet 1  . Licorice, Glycyrrhiza glabra, (LICORICE PO) Take 1 capsule by mouth 3 (three) times daily. Will switch from capsules to chews depending. Will take 3 at the end of meal if taking the chews    . meclizine (ANTIVERT) 12.5 MG tablet Take 1 tablet (12.5 mg total) by mouth 3 (three) times daily as needed for dizziness. 30 tablet 1  . meloxicam (MOBIC) 15 MG tablet Take 1 tablet (15 mg total) by mouth daily as needed for pain. 90 tablet 1  . ondansetron (ZOFRAN) 4 MG tablet Take 1 tablet (4 mg total) by mouth every 8 (eight) hours as needed for nausea or vomiting. 30 tablet 1  . OVER THE COUNTER MEDICATION Take 1 tablet by mouth 3 (three) times daily as needed (for pain). Curamin Extra Strength    . pantoprazole (PROTONIX) 40 MG tablet Take 1 tablet (40 mg total) by mouth 2 (two) times daily. 180 tablet 3  . cetirizine (ZYRTEC) 10 MG tablet Take 1 tablet (10 mg total) by mouth daily. 30 tablet 11   No current facility-administered medications on file prior to visit.    Review of Systems Constitutional: Negative for other unusual diaphoresis, sweats, appetite or weight changes HENT: Negative for other worsening hearing loss, ear pain, facial swelling, mouth sores or neck stiffness.   Eyes: Negative for other worsening pain, redness or other visual disturbance.  Respiratory: Negative for other stridor or swelling Cardiovascular: Negative for other palpitations or other chest pain  Gastrointestinal: Negative for worsening diarrhea or loose stools, blood in stool, distention or other pain Genitourinary: Negative for hematuria, flank pain or other change in urine volume.  Musculoskeletal:  Negative for myalgias or other joint swelling.  Skin: Negative for other color change, or other wound or worsening drainage.  Neurological: Negative for other syncope or numbness. Hematological: Negative for other adenopathy or swelling Psychiatric/Behavioral: Negative for hallucinations, other worsening agitation, SI, self-injury, or new decreased concentration All other system neg per pt    Objective:   Physical Exam BP 126/84   Pulse 89   Temp 98.5 F (36.9 C) (Oral)   Ht 4' 11.5" (1.511 m)   Wt 218 lb (98.9 kg)   SpO2 98%   BMI 43.29 kg/m  VS noted,  Constitutional: Pt is oriented to person, place, and time. Appears well-developed and well-nourished, in no significant distress and comfortable Head: Normocephalic and atraumatic  Eyes: Conjunctivae and EOM are normal. Pupils are equal, round, and reactive to light Right Ear: External ear normal without discharge Left Ear:  External ear normal without discharge Nose: Nose without discharge or deformity Mouth/Throat: Oropharynx is without other ulcerations and moist  Neck: Normal range of motion. Neck supple. No JVD present. No tracheal deviation present or significant neck LA or mass Cardiovascular: Normal rate, regular rhythm, normal heart sounds and intact distal pulses.   Pulmonary/Chest: WOB normal and breath sounds without rales or wheezing  Abdominal: Soft. Bowel sounds are normal. NT. No HSM  Musculoskeletal: Normal range of motion. Exhibits no edema Lymphadenopathy: Has no other cervical adenopathy.  Neurological: Pt is alert and oriented to person, place, and time. Pt has normal reflexes. No cranial nerve deficit. Motor grossly intact, Gait intact Skin: Skin is warm and dry. No rash noted or new ulcerations Psychiatric:  Has normal mood and affect. Behavior is normal without agitation No other exam findings Lab Results  Component Value Date   WBC 5.2 06/21/2019   HGB 12.3 06/21/2019   HCT 37.4 06/21/2019   PLT 306.0  06/21/2019   GLUCOSE 87 06/21/2019   CHOL 193 06/21/2019   TRIG 47.0 06/21/2019   HDL 78.50 06/21/2019   LDLDIRECT 114.4 03/13/2013   LDLCALC 106 (H) 06/21/2019   ALT 15 06/21/2019   AST 16 06/21/2019   NA 138 06/21/2019   K 3.5 06/21/2019   CL 101 06/21/2019   CREATININE 0.56 06/21/2019   BUN 10 06/21/2019   CO2 28 06/21/2019   TSH 2.02 06/21/2019   INR 1.06 11/10/2016   HGBA1C 6.6 (H) 06/21/2019      Assessment & Plan:

## 2019-06-22 ENCOUNTER — Encounter: Payer: Self-pay | Admitting: Internal Medicine

## 2019-06-22 NOTE — Assessment & Plan Note (Signed)
For SPEP today,  to f/u any worsening symptoms or concerns

## 2019-06-22 NOTE — Assessment & Plan Note (Signed)

## 2019-06-22 NOTE — Assessment & Plan Note (Signed)
stable overall by history and exam, recent data reviewed with pt, and pt to continue medical treatment as before,  to f/u any worsening symptoms or concerns  

## 2019-06-22 NOTE — Assessment & Plan Note (Addendum)
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

## 2019-06-25 ENCOUNTER — Telehealth: Payer: Self-pay

## 2019-06-25 LAB — PROTEIN ELECTROPHORESIS, SERUM
Albumin ELP: 4.3 g/dL (ref 3.8–4.8)
Alpha 1: 0.3 g/dL (ref 0.2–0.3)
Alpha 2: 0.6 g/dL (ref 0.5–0.9)
Beta 2: 0.6 g/dL — ABNORMAL HIGH (ref 0.2–0.5)
Beta Globulin: 0.5 g/dL (ref 0.4–0.6)
Gamma Globulin: 1.9 g/dL — ABNORMAL HIGH (ref 0.8–1.7)
Total Protein: 8.2 g/dL — ABNORMAL HIGH (ref 6.1–8.1)

## 2019-06-25 NOTE — Telephone Encounter (Signed)
Pt has been informed of results and expressed understanding.  °

## 2019-06-25 NOTE — Telephone Encounter (Signed)
-----   Message from Biagio Borg, MD sent at 06/21/2019  7:41 PM EDT ----- Left message on MyChart, pt to cont same tx except  The test results show that your current treatment is OK, as the tests are stable, except the Vitamin D level is low.  Please take Vitamin D 50000 units weekly for 12 weeks, then plan to change to OTC Vitamin D3 at 2000 units per day, indefinitely.   Shirron to please inform pt, I will do rx

## 2019-09-17 ENCOUNTER — Other Ambulatory Visit: Payer: Self-pay | Admitting: Internal Medicine

## 2020-01-09 ENCOUNTER — Other Ambulatory Visit: Payer: Self-pay | Admitting: Internal Medicine

## 2020-01-31 ENCOUNTER — Other Ambulatory Visit: Payer: Self-pay | Admitting: Internal Medicine

## 2020-03-25 ENCOUNTER — Other Ambulatory Visit: Payer: Self-pay | Admitting: Internal Medicine

## 2020-06-24 ENCOUNTER — Ambulatory Visit (INDEPENDENT_AMBULATORY_CARE_PROVIDER_SITE_OTHER): Payer: 59 | Admitting: Internal Medicine

## 2020-06-24 ENCOUNTER — Encounter: Payer: Self-pay | Admitting: Internal Medicine

## 2020-06-24 ENCOUNTER — Other Ambulatory Visit: Payer: Self-pay

## 2020-06-24 VITALS — BP 130/90 | HR 73 | Temp 98.5°F | Ht 59.0 in | Wt 210.2 lb

## 2020-06-24 DIAGNOSIS — E538 Deficiency of other specified B group vitamins: Secondary | ICD-10-CM | POA: Diagnosis not present

## 2020-06-24 DIAGNOSIS — R739 Hyperglycemia, unspecified: Secondary | ICD-10-CM

## 2020-06-24 DIAGNOSIS — E559 Vitamin D deficiency, unspecified: Secondary | ICD-10-CM

## 2020-06-24 DIAGNOSIS — Z23 Encounter for immunization: Secondary | ICD-10-CM

## 2020-06-24 DIAGNOSIS — Z0000 Encounter for general adult medical examination without abnormal findings: Secondary | ICD-10-CM

## 2020-06-24 DIAGNOSIS — Z0001 Encounter for general adult medical examination with abnormal findings: Secondary | ICD-10-CM

## 2020-06-24 MED ORDER — AMLODIPINE BESYLATE 10 MG PO TABS: 10.0000 mg | ORAL_TABLET | Freq: Every day | ORAL | 3 refills | Status: DC

## 2020-06-24 MED ORDER — IRBESARTAN 300 MG PO TABS: 300.0000 mg | ORAL_TABLET | Freq: Every day | ORAL | 3 refills | Status: DC

## 2020-06-24 NOTE — Patient Instructions (Addendum)
You had the Tetanus shot today  Please continue all other medications as before, and refills have been done if requested.  Please have the pharmacy call with any other refills you may need.  Please continue your efforts at being more active, low cholesterol diet, and weight control.  You are otherwise up to date with prevention measures today.  Please keep your appointments with your specialists as you may have planned  Please go to the LAB at the blood drawing area for the tests to be done  You will be contacted by phone if any changes need to be made immediately.  Otherwise, you will receive a letter about your results with an explanation, but please check with MyChart first.  Please remember to sign up for MyChart if you have not done so, as this will be important to you in the future with finding out test results, communicating by private email, and scheduling acute appointments online when needed.  Please make an Appointment to return for your 1 year visit, or sooner if needed, with Lab testing by Appointment as well, to be done about 3-5 days before at the FIRST FLOOR Lab (so this is for TWO appointments - please see the scheduling desk as you leave)

## 2020-06-24 NOTE — Progress Notes (Signed)
Subjective:    Patient ID: Bianca Campbell, female    DOB: [birth year], 64 y.o.   MRN: [id number]  HPI  Here for wellness and f/u;  Overall doing ok;  Pt denies Chest pain, worsening SOB, DOE, wheezing, orthopnea, PND, worsening LE edema, palpitations, dizziness or syncope.  Pt denies neurological change such as new headache, facial or extremity weakness.  Pt denies polydipsia, polyuria, or low sugar symptoms. Pt states overall good compliance with treatment and medications, good tolerability, and has been trying to follow appropriate diet.  Pt denies worsening depressive symptoms, suicidal ideation or panic. No fever, night sweats, wt loss, loss of appetite, or other constitutional symptoms.  Pt states good ability with ADL's, has low fall risk, home safety reviewed and adequate, no other significant changes in hearing or vision, and only occasionally active with exercise.  No new complaints Past Medical History:  Diagnosis Date  . ALLERGIC RHINITIS 09/11/2007  . Anemia, unspecified 08/31/2012  . Arthritis    "knees" (11/23/2016)  . BACK PAIN   . EXTERNAL OTITIS 09/11/2007  . GERD 06/06/2007  . GOUT 11/14/2007  . High cholesterol   . HYPERTENSION 06/06/2007  . INTERMITTENT VERTIGO 09/11/2007  . Irritable bowel syndrome 06/06/2007  . LOW BACK PAIN 06/06/2007  . OSTEOARTHRITIS 06/06/2007  . PONV (postoperative nausea and vomiting)   . Primary localized osteoarthritis of right knee   . SHOULDER PAIN, RIGHT 12/30/2008   Past Surgical History:  Procedure Laterality Date  . 24 H[place] PH STUDY N/A 05/27/2019   Procedure: 24 HOUR PH STUDY;  Surgeon: Beverley Fiedler, MD;  Location: [facility];  Service: Gastroenterology;  Laterality: N/A;  . BILATERAL SALPINGOOPHORECTOMY    . CESAREAN SECTION  1987, 1991  . COLONOSCOPY     pt stated 2015 or 2016 unknown md  . ESOPHAGEAL MANOMETRY N/A 05/27/2019   Procedure: ESOPHAGEAL MANOMETRY (EM) WITH IMPEDENCE STUDY;  Surgeon: Beverley Fiedler, MD;  Location: [facility];  Service: Gastroenterology;  Laterality: N/A;  . EXPLORATORY LAPAROTOMY     bowel obstruction  . INGUINAL HERNIA REPAIR Bilateral    numerous hernia repairs 1996 to 2011  . JOINT REPLACEMENT    . KNEE ARTHROSCOPY Bilateral 2013 - 11/11/2015  . LAPAROSCOPIC CHOLECYSTECTOMY  1991  . s/p incarcerated hernia and ovary cyst Jan 2011  2011  . TOTAL KNEE ARTHROPLASTY Right 11/21/2016   Procedure: RIGHT TOTAL KNEE ARTHROPLASTY;  Surgeon: Salvatore Marvel, MD;  Location: [facility];  Service: Orthopedics;  Laterality: Right;  . UMBILICAL HERNIA REPAIR     numerous hernia repairs 1996 to 2011    reports that she has never smoked. She has never used smokeless tobacco. She reports that she does not drink alcohol and does not use drugs. family history includes Arthritis in her mother; Asthma in her daughter; Colon polyps (age of onset: 54) in her father; Diabetes in her mother; Healthy in her brother, daughter, sister, and son; Seizures in her mother. Allergies  Allergen Reactions  . Latex Itching   Current Outpatient Medications on File Prior to Visit  Medication Sig Dispense Refill  . Cholecalciferol (VITAMIN D3) 5000 units CAPS Take 5,000 Units by mouth daily.     . Cyanocobalamin (VITAMIN B 12 PO) Take 1 tablet by mouth daily.    . diclofenac (VOLTAREN) 75 MG EC tablet Take 75 mg by mouth daily as needed for mild pain.    Marland Kitchen dicyclomine (BENTYL) 20 MG tablet TAKE ONE TABLET BY MOUTH THREE TIMES DAILY  BEFORE MEAL(S) 50 tablet 1  . Digestive Enzyme CAPS Take 1 capsule by mouth 3 (three) times daily.    . Licorice, Glycyrrhiza glabra, (LICORICE PO) Take 1 capsule by mouth 3 (three) times daily. Will switch from capsules to chews depending. Will take 3 at the end of meal if taking the chews    . meclizine (ANTIVERT) 12.5 MG tablet TAKE 1 TABLET BY MOUTH THREE TIMES DAILY AS NEEDED FOR DIZZINESS 30 tablet 0  . meloxicam (MOBIC) 15 MG tablet TAKE 1 TABLET BY MOUTH ONCE DAILY AS NEEDED FOR PAIN 90  tablet 0  . methylPREDNISolone acetate (DEPO-MEDROL) 40 MG/ML injection Inject 80 mg into the muscle once.    . ondansetron (ZOFRAN) 4 MG tablet Take 1 tablet (4 mg total) by mouth every 8 (eight) hours as needed for nausea or vomiting. 30 tablet 1  . OVER THE COUNTER MEDICATION Take 1 tablet by mouth 3 (three) times daily as needed (for pain). Curamin Extra Strength    . PERCOCET 10-325 MG tablet Take 1 tablet by mouth.    . cetirizine (ZYRTEC) 10 MG tablet Take 1 tablet (10 mg total) by mouth daily. 30 tablet 11   No current facility-administered medications on file prior to visit.   Review of Systems All otherwise neg per pt    Objective:   Physical Exam BP 130/90 (BP Location: Left Arm, Patient Position: Sitting, Cuff Size: Large)   Pulse 73   Temp 98.5 F (36.9 C) (Oral)   Ht 4\' 11"  (1.499 m)   Wt 210 lb 4 oz (95.4 kg)   SpO2 97%   BMI 42.47 kg/m  VS noted,  Constitutional: Pt appears in NAD HENT: Head: NCAT.  Right Ear: External ear normal.  Left Ear: External ear normal.  Eyes: . Pupils are equal, round, and reactive to light. Conjunctivae and EOM are normal Nose: without d/c or deformity Neck: Neck supple. Gross normal ROM Cardiovascular: Normal rate and regular rhythm.   Pulmonary/Chest: Effort normal and breath sounds without rales or wheezing.  Abd:  Soft, NT, ND, + BS, no organomegaly Neurological: Pt is alert. At baseline orientation, motor grossly intact Skin: Skin is warm. No rashes, other new lesions, no LE edema Psychiatric: Pt behavior is normal without agitation  All otherwise neg per pt Lab Results  Component Value Date   WBC 6.2 06/24/2020   HGB 12.6 06/24/2020   HCT 38.3 06/24/2020   PLT 340 06/24/2020   GLUCOSE 80 06/24/2020   CHOL 228 (H) 06/24/2020   TRIG 61 06/24/2020   HDL 94 06/24/2020   LDLDIRECT 114.4 03/13/2013   LDLCALC 119 (H) 06/24/2020   ALT 18 06/24/2020   AST 16 06/24/2020   NA 137 06/24/2020   K 5.4 (H) 06/24/2020   CL 102  06/24/2020   CREATININE 0.68 06/24/2020   BUN 13 06/24/2020   CO2 29 06/24/2020   TSH 2.37 06/24/2020   INR 1.06 11/10/2016   HGBA1C 6.4 (H) 06/24/2020      Assessment & Plan:

## 2020-06-25 ENCOUNTER — Other Ambulatory Visit: Payer: Self-pay | Admitting: Internal Medicine

## 2020-06-25 ENCOUNTER — Encounter: Payer: Self-pay | Admitting: Internal Medicine

## 2020-06-25 LAB — COMPLETE METABOLIC PANEL WITH GFR
AG Ratio: 1.2 (calc) (ref 1.0–2.5)
ALT: 18 U/L (ref 6–29)
AST: 16 U/L (ref 10–35)
Albumin: 4.5 g/dL (ref 3.6–5.1)
Alkaline phosphatase (APISO): 92 U/L (ref 37–153)
BUN: 13 mg/dL (ref 7–25)
CO2: 29 mmol/L (ref 20–32)
Calcium: 10.6 mg/dL — ABNORMAL HIGH (ref 8.6–10.4)
Chloride: 102 mmol/L (ref 98–110)
Creat: 0.68 mg/dL (ref 0.50–0.99)
GFR, Est African American: 108 mL/min/{1.73_m2} (ref >=60)
GFR, Est Non African American: 93 mL/min/{1.73_m2} (ref >=60)
Globulin: 3.8 g/dL (calc) — ABNORMAL HIGH (ref 1.9–3.7)
Glucose, Bld: 80 mg/dL (ref 65–99)
Potassium: 5.4 mmol/L — ABNORMAL HIGH (ref 3.5–5.3)
Sodium: 137 mmol/L (ref 135–146)
Total Bilirubin: 0.9 mg/dL (ref 0.2–1.2)
Total Protein: 8.3 g/dL — ABNORMAL HIGH (ref 6.1–8.1)

## 2020-06-25 LAB — CBC WITH DIFFERENTIAL/PLATELET
Absolute Monocytes: 459 cells/uL (ref 200–950)
Basophils Absolute: 37 cells/uL (ref 0–200)
Basophils Relative: 0.6 %
Eosinophils Absolute: 217 cells/uL (ref 15–500)
Eosinophils Relative: 3.5 %
HCT: 38.3 % (ref 35.0–45.0)
Hemoglobin: 12.6 g/dL (ref 11.7–15.5)
Lymphs Abs: 2920 cells/uL (ref 850–3900)
MCH: 26.7 pg — ABNORMAL LOW (ref 27.0–33.0)
MCHC: 32.9 g/dL (ref 32.0–36.0)
MCV: 81.1 fL (ref 80.0–100.0)
MPV: 10.8 fL (ref 7.5–12.5)
Monocytes Relative: 7.4 %
Neutro Abs: 2567 cells/uL (ref 1500–7800)
Neutrophils Relative %: 41.4 %
Platelets: 340 10*3/uL (ref 140–400)
RBC: 4.72 10*6/uL (ref 3.80–5.10)
RDW: 13.1 % (ref 11.0–15.0)
Total Lymphocyte: 47.1 %
WBC: 6.2 10*3/uL (ref 3.8–10.8)

## 2020-06-25 LAB — URINALYSIS, ROUTINE W REFLEX MICROSCOPIC
Bilirubin Urine: NEGATIVE
Glucose, UA: NEGATIVE
Hgb urine dipstick: NEGATIVE
Ketones, ur: NEGATIVE
Leukocytes,Ua: NEGATIVE
Nitrite: NEGATIVE
Protein, ur: NEGATIVE
Specific Gravity, Urine: 1.005 (ref 1.001–1.03)
pH: 7 (ref 5.0–8.0)

## 2020-06-25 LAB — LIPID PANEL
Cholesterol: 228 mg/dL — ABNORMAL HIGH (ref <200)
HDL: 94 mg/dL (ref >=50)
LDL Cholesterol (Calc): 119 mg/dL (calc) — ABNORMAL HIGH
Non-HDL Cholesterol (Calc): 134 mg/dL (calc) — ABNORMAL HIGH (ref <130)
Total CHOL/HDL Ratio: 2.4 (calc) (ref <5.0)
Triglycerides: 61 mg/dL (ref <150)

## 2020-06-25 LAB — TSH: TSH: 2.37 mIU/L (ref 0.40–4.50)

## 2020-06-25 LAB — HEMOGLOBIN A1C
Hgb A1c MFr Bld: 6.4 % of total Hgb — ABNORMAL HIGH (ref <5.7)
Mean Plasma Glucose: 137 (calc)
eAG (mmol/L): 7.6 (calc)

## 2020-06-25 LAB — VITAMIN B12: Vitamin B-12: 995 pg/mL (ref 200–1100)

## 2020-06-25 LAB — VITAMIN D 25 HYDROXY (VIT D DEFICIENCY, FRACTURES): Vit D, 25-Hydroxy: 28 ng/mL — ABNORMAL LOW (ref 30–100)

## 2020-06-25 MED ORDER — VITAMIN D (ERGOCALCIFEROL) 1.25 MG (50000 UNIT) PO CAPS: 50000.0000 [IU] | ORAL_CAPSULE | ORAL | 0 refills | Status: DC

## 2020-06-27 ENCOUNTER — Encounter: Payer: Self-pay | Admitting: Internal Medicine

## 2020-06-27 NOTE — Assessment & Plan Note (Signed)
Cont oral replacement 

## 2020-06-27 NOTE — Assessment & Plan Note (Signed)
stable overall by history and exam, recent data reviewed with pt, and pt to continue medical treatment as before,  to f/u any worsening symptoms or concerns  

## 2020-06-27 NOTE — Assessment & Plan Note (Signed)

## 2021-06-17 ENCOUNTER — Telehealth: Payer: Self-pay | Admitting: Internal Medicine

## 2021-06-17 DIAGNOSIS — E559 Vitamin D deficiency, unspecified: Secondary | ICD-10-CM

## 2021-06-17 DIAGNOSIS — E7800 Pure hypercholesterolemia, unspecified: Secondary | ICD-10-CM

## 2021-06-17 DIAGNOSIS — E538 Deficiency of other specified B group vitamins: Secondary | ICD-10-CM

## 2021-06-17 DIAGNOSIS — R739 Hyperglycemia, unspecified: Secondary | ICD-10-CM

## 2021-06-17 NOTE — Telephone Encounter (Signed)
CPe 9/15, lab appt 9/12 Need lab orders please.

## 2021-06-17 NOTE — Telephone Encounter (Signed)
Ok labs ordered 

## 2021-06-28 ENCOUNTER — Other Ambulatory Visit: Payer: Self-pay

## 2021-06-28 ENCOUNTER — Other Ambulatory Visit (INDEPENDENT_AMBULATORY_CARE_PROVIDER_SITE_OTHER): Payer: 59

## 2021-06-28 DIAGNOSIS — E538 Deficiency of other specified B group vitamins: Secondary | ICD-10-CM | POA: Diagnosis not present

## 2021-06-28 DIAGNOSIS — R739 Hyperglycemia, unspecified: Secondary | ICD-10-CM | POA: Diagnosis not present

## 2021-06-28 DIAGNOSIS — E7800 Pure hypercholesterolemia, unspecified: Secondary | ICD-10-CM | POA: Diagnosis not present

## 2021-06-28 DIAGNOSIS — E559 Vitamin D deficiency, unspecified: Secondary | ICD-10-CM

## 2021-06-28 LAB — CBC WITH DIFFERENTIAL/PLATELET
Basophils Absolute: 0 10*3/uL (ref 0.0–0.1)
Basophils Relative: 0.8 % (ref 0.0–3.0)
Eosinophils Absolute: 0.2 10*3/uL (ref 0.0–0.7)
Eosinophils Relative: 3.5 % (ref 0.0–5.0)
HCT: 36.8 % (ref 36.0–46.0)
Hemoglobin: 11.8 g/dL — ABNORMAL LOW (ref 12.0–15.0)
Lymphocytes Relative: 43.1 % (ref 12.0–46.0)
Lymphs Abs: 2.2 10*3/uL (ref 0.7–4.0)
MCHC: 32 g/dL (ref 30.0–36.0)
MCV: 82.6 fl (ref 78.0–100.0)
Monocytes Absolute: 0.3 10*3/uL (ref 0.1–1.0)
Monocytes Relative: 5.2 % (ref 3.0–12.0)
Neutro Abs: 2.4 10*3/uL (ref 1.4–7.7)
Neutrophils Relative %: 47.4 % (ref 43.0–77.0)
Platelets: 288 10*3/uL (ref 150.0–400.0)
RBC: 4.46 Mil/uL (ref 3.87–5.11)
RDW: 13.4 % (ref 11.5–15.5)
WBC: 5.1 10*3/uL (ref 4.0–10.5)

## 2021-06-28 LAB — LIPID PANEL
Cholesterol: 177 mg/dL (ref 0–200)
HDL: 72.7 mg/dL (ref >39.00)
LDL Cholesterol: 83 mg/dL (ref 0–99)
NonHDL: 104.78
Total CHOL/HDL Ratio: 2
Triglycerides: 108 mg/dL (ref 0.0–149.0)
VLDL: 21.6 mg/dL (ref 0.0–40.0)

## 2021-06-28 LAB — BASIC METABOLIC PANEL
BUN: 12 mg/dL (ref 6–23)
CO2: 27 mEq/L (ref 19–32)
Calcium: 9.4 mg/dL (ref 8.4–10.5)
Chloride: 104 mEq/L (ref 96–112)
Creatinine, Ser: 0.55 mg/dL (ref 0.40–1.20)
GFR: 96.99 mL/min (ref >60.00)
Glucose, Bld: 142 mg/dL — ABNORMAL HIGH (ref 70–99)
Potassium: 3.5 mEq/L (ref 3.5–5.1)
Sodium: 139 mEq/L (ref 135–145)

## 2021-06-28 LAB — URINALYSIS, ROUTINE W REFLEX MICROSCOPIC
Bilirubin Urine: NEGATIVE
Hgb urine dipstick: NEGATIVE
Ketones, ur: NEGATIVE
Leukocytes,Ua: NEGATIVE
Nitrite: NEGATIVE
RBC / HPF: NONE SEEN (ref 0–?)
Specific Gravity, Urine: >=1.03 — AB (ref 1.000–1.030)
Total Protein, Urine: NEGATIVE
Urine Glucose: NEGATIVE
Urobilinogen, UA: 0.2 (ref 0.0–1.0)
pH: 5.5 (ref 5.0–8.0)

## 2021-06-28 LAB — HEPATIC FUNCTION PANEL
ALT: 12 U/L (ref 0–35)
AST: 16 U/L (ref 0–37)
Albumin: 3.9 g/dL (ref 3.5–5.2)
Alkaline Phosphatase: 77 U/L (ref 39–117)
Bilirubin, Direct: 0.1 mg/dL (ref 0.0–0.3)
Total Bilirubin: 0.6 mg/dL (ref 0.2–1.2)
Total Protein: 7.6 g/dL (ref 6.0–8.3)

## 2021-06-28 LAB — VITAMIN D 25 HYDROXY (VIT D DEFICIENCY, FRACTURES): VITD: 26.93 ng/mL — ABNORMAL LOW (ref 30.00–100.00)

## 2021-06-28 LAB — VITAMIN B12: Vitamin B-12: 794 pg/mL (ref 211–911)

## 2021-06-28 LAB — TSH: TSH: 2.05 u[IU]/mL (ref 0.35–5.50)

## 2021-06-28 LAB — HEMOGLOBIN A1C: Hgb A1c MFr Bld: 6.3 % (ref 4.6–6.5)

## 2021-07-01 ENCOUNTER — Encounter: Payer: 59 | Admitting: Internal Medicine

## 2021-07-12 ENCOUNTER — Other Ambulatory Visit: Payer: Self-pay

## 2021-07-13 ENCOUNTER — Encounter: Payer: Self-pay | Admitting: Internal Medicine

## 2021-07-13 ENCOUNTER — Ambulatory Visit (INDEPENDENT_AMBULATORY_CARE_PROVIDER_SITE_OTHER): Payer: 59 | Admitting: Internal Medicine

## 2021-07-13 VITALS — BP 124/90 | HR 84 | Temp 98.3°F | Resp 18 | Ht 59.0 in | Wt 218.8 lb

## 2021-07-13 DIAGNOSIS — R1310 Dysphagia, unspecified: Secondary | ICD-10-CM

## 2021-07-13 DIAGNOSIS — Z0001 Encounter for general adult medical examination with abnormal findings: Secondary | ICD-10-CM | POA: Diagnosis not present

## 2021-07-13 DIAGNOSIS — E7800 Pure hypercholesterolemia, unspecified: Secondary | ICD-10-CM

## 2021-07-13 DIAGNOSIS — R739 Hyperglycemia, unspecified: Secondary | ICD-10-CM

## 2021-07-13 DIAGNOSIS — I10 Essential (primary) hypertension: Secondary | ICD-10-CM | POA: Diagnosis not present

## 2021-07-13 DIAGNOSIS — E559 Vitamin D deficiency, unspecified: Secondary | ICD-10-CM

## 2021-07-13 DIAGNOSIS — D649 Anemia, unspecified: Secondary | ICD-10-CM

## 2021-07-13 NOTE — Progress Notes (Signed)
Patient ID: Bianca Campbell, female   DOB: 13-May-1956, 65 y.o.   MRN: [id number]        Chief Complaint:: wellness exam and Annual Exam (Concerns for drainage to her sinus on her left side. She has also been having a cooling sensation to the top of her throat. )  , low vit d, anemia, htn, hld, hyperglycemia       HPI:  Bianca Campbell is a 65 y.o. female here for wellness exam; declines covid booster, shingrix, flu shot o/w up to date with preventive referrals and immunizations                        Also c/o 1 mo worsening onset mild upper mid chest/neck cooling sensation and swallowing difficulty solids, but not really liquids.  Wt was down to 202 recently, then up with vacation, mostly intentional. O/w Pt denies chest pain, increased sob or doe, wheezing, orthopnea, PND, increased LE swelling, palpitations, dizziness or syncope.   Pt denies polydipsia, polyuria, or new focal neuro s/s.  No overt bleeding.  Taking Vit D 1000 u qd.   Pt denies polydipsia, polyuria, or new focal neuro s/s.   Pt denies fever, night sweats, loss of appetite, or other constitutional symptoms    Wt Readings from Last 3 Encounters:  07/13/21 218 lb 12.8 oz (99.2 kg)  06/24/20 210 lb 4 oz (95.4 kg)  06/21/19 218 lb (98.9 kg)   BP Readings from Last 3 Encounters:  07/13/21 124/90  06/24/20 130/90  06/21/19 126/84   Immunization History  Administered Date(s) Administered   Influenza,inj,Quad PF,6+ Mos 09/15/2015, 08/27/2018   PFIZER(Purple Top)SARS-COV-2 Vaccination 12/24/2019, 01/14/2020   Td 02/24/2010   Tdap 06/24/2020   There are no preventive care reminders to display for this patient.     Past Medical History:  Diagnosis Date   ALLERGIC RHINITIS 09/11/2007   Anemia, unspecified 08/31/2012   Arthritis    "knees" (11/23/2016)   BACK PAIN    EXTERNAL OTITIS 09/11/2007   GERD 06/06/2007   GOUT 11/14/2007   High cholesterol    HYPERTENSION 06/06/2007   INTERMITTENT VERTIGO 09/11/2007   Irritable bowel  syndrome 06/06/2007   LOW BACK PAIN 06/06/2007   OSTEOARTHRITIS 06/06/2007   PONV (postoperative nausea and vomiting)    Primary localized osteoarthritis of right knee    SHOULDER PAIN, RIGHT 12/30/2008   Past Surgical History:  Procedure Laterality Date   73 HOUR PH STUDY N/A 05/27/2019   Procedure: 24 HOUR PH STUDY;  Surgeon: Beverley Fiedler, MD;  Location: [facility];  Service: Gastroenterology;  Laterality: N/A;   BILATERAL SALPINGOOPHORECTOMY     CESAREAN SECTION  1987, 1991   COLONOSCOPY     pt stated 2015 or 2016 unknown md   ESOPHAGEAL MANOMETRY N/A 05/27/2019   Procedure: ESOPHAGEAL MANOMETRY (EM) WITH IMPEDENCE STUDY;  Surgeon: Beverley Fiedler, MD;  Location: [facility];  Service: Gastroenterology;  Laterality: N/A;   EXPLORATORY LAPAROTOMY     bowel obstruction   INGUINAL HERNIA REPAIR Bilateral    numerous hernia repairs 1996 to 2011   JOINT REPLACEMENT     KNEE ARTHROSCOPY Bilateral 2013 - 11/11/2015   LAPAROSCOPIC CHOLECYSTECTOMY  1991   s/p incarcerated hernia and ovary cyst Jan 2011  2011   TOTAL KNEE ARTHROPLASTY Right 11/21/2016   Procedure: RIGHT TOTAL KNEE ARTHROPLASTY;  Surgeon: Salvatore Marvel, MD;  Location: [facility] OR;  Service: Orthopedics;  Laterality: Right;   UMBILICAL HERNIA  REPAIR     numerous hernia repairs 1996 to 2011    reports that she has never smoked. She has never used smokeless tobacco. She reports that she does not drink alcohol and does not use drugs. family history includes Arthritis in her mother; Asthma in her daughter; Colon polyps (age of onset: 62) in her father; Diabetes in her mother; Healthy in her brother, daughter, sister, and son; Seizures in her mother. Allergies  Allergen Reactions   Latex Itching   Current Outpatient Medications on File Prior to Visit  Medication Sig Dispense Refill   amLODipine (NORVASC) 10 MG tablet Take 1 tablet (10 mg total) by mouth daily. 90 tablet 3   dicyclomine (BENTYL) 20 MG tablet TAKE ONE TABLET BY MOUTH  THREE TIMES DAILY BEFORE MEAL(S) 50 tablet 1   irbesartan (AVAPRO) 300 MG tablet Take 1 tablet (300 mg total) by mouth daily. 90 tablet 3   meclizine (ANTIVERT) 12.5 MG tablet TAKE 1 TABLET BY MOUTH THREE TIMES DAILY AS NEEDED FOR DIZZINESS 30 tablet 0   meloxicam (MOBIC) 15 MG tablet TAKE 1 TABLET BY MOUTH ONCE DAILY AS NEEDED FOR PAIN 90 tablet 0   Vitamin D, Ergocalciferol, (DRISDOL) 1.25 MG (50000 UNIT) CAPS capsule Take 1 capsule (50,000 Units total) by mouth every 7 (seven) days. 12 capsule 0   cetirizine (ZYRTEC) 10 MG tablet Take 1 tablet (10 mg total) by mouth daily. 30 tablet 11   Cholecalciferol (VITAMIN D3) 5000 units CAPS Take 5,000 Units by mouth daily.  (Patient not taking: Reported on 07/13/2021)     Cyanocobalamin (VITAMIN B 12 PO) Take 1 tablet by mouth daily. (Patient not taking: Reported on 07/13/2021)     diclofenac (VOLTAREN) 75 MG EC tablet Take 75 mg by mouth daily as needed for mild pain. (Patient not taking: Reported on 07/13/2021)     Digestive Enzyme CAPS Take 1 capsule by mouth 3 (three) times daily. (Patient not taking: Reported on 07/13/2021)     Licorice, Glycyrrhiza glabra, (LICORICE PO) Take 1 capsule by mouth 3 (three) times daily. Will switch from capsules to chews depending. Will take 3 at the end of meal if taking the chews (Patient not taking: Reported on 07/13/2021)     methylPREDNISolone acetate (DEPO-MEDROL) 40 MG/ML injection Inject 80 mg into the muscle once. (Patient not taking: Reported on 07/13/2021)     ondansetron (ZOFRAN) 4 MG tablet Take 1 tablet (4 mg total) by mouth every 8 (eight) hours as needed for nausea or vomiting. (Patient not taking: Reported on 07/13/2021) 30 tablet 1   OVER THE COUNTER MEDICATION Take 1 tablet by mouth 3 (three) times daily as needed (for pain). Curamin Extra Strength (Patient not taking: Reported on 07/13/2021)     PERCOCET 10-325 MG tablet Take 1 tablet by mouth. (Patient not taking: Reported on 07/13/2021)     No current  facility-administered medications on file prior to visit.        ROS:  All others reviewed and negative.  Objective        PE:  BP 124/90   Pulse 84   Temp 98.3 F (36.8 C) (Oral)   Resp 18   Ht 4\' 11"  (1.499 m)   Wt 218 lb 12.8 oz (99.2 kg)   SpO2 99%   BMI 44.19 kg/m                 Constitutional: Pt appears in NAD  HENT: Head: NCAT.                Right Ear: External ear normal.                 Left Ear: External ear normal.                Eyes: . Pupils are equal, round, and reactive to light. Conjunctivae and EOM are normal               Nose: without d/c or deformity               Neck: Neck supple. Gross normal ROM               Cardiovascular: Normal rate and regular rhythm.                 Pulmonary/Chest: Effort normal and breath sounds without rales or wheezing.                Abd:  Soft, NT, ND, + BS, no organomegaly               Neurological: Pt is alert. At baseline orientation, motor grossly intact               Skin: Skin is warm. No rashes, no other new lesions, LE edema - none               Psychiatric: Pt behavior is normal without agitation   Micro: none  Cardiac tracings I have personally interpreted today:  none  Pertinent Radiological findings (summarize): none   Lab Results  Component Value Date   WBC 5.1 06/28/2021   HGB 11.8 (L) 06/28/2021   HCT 36.8 06/28/2021   PLT 288.0 06/28/2021   GLUCOSE 142 (H) 06/28/2021   CHOL 177 06/28/2021   TRIG 108.0 06/28/2021   HDL 72.70 06/28/2021   LDLDIRECT 114.4 03/13/2013   LDLCALC 83 06/28/2021   ALT 12 06/28/2021   AST 16 06/28/2021   NA 139 06/28/2021   K 3.5 06/28/2021   CL 104 06/28/2021   CREATININE 0.55 06/28/2021   BUN 12 06/28/2021   CO2 27 06/28/2021   TSH 2.05 06/28/2021   INR 1.06 11/10/2016   HGBA1C 6.3 06/28/2021   Assessment/Plan:  KATORI WIRSING is a 65 y.o. Black or African American [2] female with  has a past medical history of ALLERGIC RHINITIS (09/11/2007),  Anemia, unspecified (08/31/2012), Arthritis, BACK PAIN, EXTERNAL OTITIS (09/11/2007), GERD (06/06/2007), GOUT (11/14/2007), High cholesterol, HYPERTENSION (06/06/2007), INTERMITTENT VERTIGO (09/11/2007), Irritable bowel syndrome (06/06/2007), LOW BACK PAIN (06/06/2007), OSTEOARTHRITIS (06/06/2007), PONV (postoperative nausea and vomiting), Primary localized osteoarthritis of right knee, and SHOULDER PAIN, RIGHT (12/30/2008).  Encounter for well adult exam with abnormal findings Age and sex appropriate education and counseling updated with regular exercise and diet Referrals for preventative services - none needed Immunizations addressed - declines covid booster, shingirx, flu shot Smoking counseling  - none needed Evidence for depression or other mood disorder - none significant Most recent labs reviewed. I have personally reviewed and have noted: 1) the patient's medical and social history 2) The patient's current medications and supplements 3) The patient's height, weight, and BMI have been recorded in the chart   Essential hypertension BP Readings from Last 3 Encounters:  07/13/21 124/90  06/24/20 130/90  06/21/19 126/84   Stable, pt to continue medical treatment norvasc, avapro   Anemia, unspecified Etiology unclear, for cbc with labs  Hyperlipidemia Lab  Results  Component Value Date   LDLCALC 83 06/28/2021   Stable, pt to continue current statin low chol diet   Vitamin D deficiency Last vitamin D Lab Results  Component Value Date   VD25OH 26.93 (L) 06/28/2021   Low, to increased oral replacement from 1000 u to 2000 u qd   Hyperglycemia Lab Results  Component Value Date   HGBA1C 6.3 06/28/2021   Stable, pt to continue current medical treatment  - diet   Dysphagia Etiology uclear, had recent wt loss, then increased again, for GI referral per pt request  Followup: Return in about 1 year (around 07/13/2022).  Oliver Barre, MD 07/16/2021 5:36 AM [facility]

## 2021-07-13 NOTE — Patient Instructions (Signed)
Ok to increase the vitamin D3 to 2000 units per day  Please to go the [facility] lab in about 1 month to recheck the anemia blood testing  You will be contacted regarding the referral for: Gastroenterlogy  Please continue all other medications as before, and refills have been done if requested.  Please have the pharmacy call with any other refills you may need.  Please continue your efforts at being more active, low cholesterol diet, and weight control.  You are otherwise up to date with prevention measures today.  Please keep your appointments with your specialists as you may have planned  Please make an Appointment to return for your 1 year visit, or sooner if needed, with Lab testing by Appointment as well, to be done about 3-5 days before at the FIRST FLOOR Lab (so this is for TWO appointments - please see the scheduling desk as you leave)   Due to the ongoing Covid 19 pandemic, our lab now requires an appointment for any labs done at our office.  If you need labs done and do not have an appointment, please call our office ahead of time to schedule before presenting to the lab for your testing.

## 2021-07-16 ENCOUNTER — Encounter: Payer: Self-pay | Admitting: Internal Medicine

## 2021-07-16 DIAGNOSIS — R1310 Dysphagia, unspecified: Secondary | ICD-10-CM | POA: Insufficient documentation

## 2021-07-16 NOTE — Assessment & Plan Note (Signed)
Etiology unclear, for cbc with labs

## 2021-07-16 NOTE — Assessment & Plan Note (Signed)
BP Readings from Last 3 Encounters:  07/13/21 124/90  06/24/20 130/90  06/21/19 126/84   Stable, pt to continue medical treatment norvasc, avapro

## 2021-07-16 NOTE — Assessment & Plan Note (Signed)
Age and sex appropriate education and counseling updated with regular exercise and diet Referrals for preventative services - none needed Immunizations addressed - declines covid booster, shingirx, flu shot Smoking counseling  - none needed Evidence for depression or other mood disorder - none significant Most recent labs reviewed. I have personally reviewed and have noted: 1) the patient's medical and social history 2) The patient's current medications and supplements 3) The patient's height, weight, and BMI have been recorded in the chart

## 2021-07-16 NOTE — Assessment & Plan Note (Signed)
Etiology uclear, had recent wt loss, then increased again, for GI referral per pt request

## 2021-07-16 NOTE — Assessment & Plan Note (Signed)
Lab Results  Component Value Date   LDLCALC 83 06/28/2021   Stable, pt to continue current statin low chol diet

## 2021-07-16 NOTE — Assessment & Plan Note (Signed)
Last vitamin D Lab Results  Component Value Date   VD25OH 26.93 (L) 06/28/2021   Low, to increased oral replacement from 1000 u to 2000 u qd

## 2021-07-16 NOTE — Assessment & Plan Note (Signed)
Lab Results  Component Value Date   HGBA1C 6.3 06/28/2021   Stable, pt to continue current medical treatment  - diet

## 2021-09-16 ENCOUNTER — Encounter: Payer: Self-pay | Admitting: Internal Medicine

## 2021-10-19 ENCOUNTER — Inpatient Hospital Stay (HOSPITAL_COMMUNITY)
Admission: EM | Admit: 2021-10-19 | Discharge: 2021-10-24 | DRG: 390 | Disposition: A | Discharge status: Home / Self Care | Payer: 59 | Attending: Internal Medicine | Admitting: Internal Medicine

## 2021-10-19 ENCOUNTER — Other Ambulatory Visit: Payer: Self-pay

## 2021-10-19 ENCOUNTER — Encounter (HOSPITAL_COMMUNITY): Payer: Self-pay

## 2021-10-19 ENCOUNTER — Emergency Department (HOSPITAL_COMMUNITY): Payer: 59

## 2021-10-19 DIAGNOSIS — I10 Essential (primary) hypertension: Secondary | ICD-10-CM | POA: Diagnosis present

## 2021-10-19 DIAGNOSIS — R739 Hyperglycemia, unspecified: Secondary | ICD-10-CM | POA: Diagnosis present

## 2021-10-19 DIAGNOSIS — Z825 Family history of asthma and other chronic lower respiratory diseases: Secondary | ICD-10-CM

## 2021-10-19 DIAGNOSIS — Z91040 Latex allergy status: Secondary | ICD-10-CM

## 2021-10-19 DIAGNOSIS — R112 Nausea with vomiting, unspecified: Secondary | ICD-10-CM | POA: Diagnosis present

## 2021-10-19 DIAGNOSIS — K56609 Unspecified intestinal obstruction, unspecified as to partial versus complete obstruction: Secondary | ICD-10-CM | POA: Diagnosis not present

## 2021-10-19 DIAGNOSIS — Z79899 Other long term (current) drug therapy: Secondary | ICD-10-CM

## 2021-10-19 DIAGNOSIS — K565 Intestinal adhesions [bands], unspecified as to partial versus complete obstruction: Secondary | ICD-10-CM | POA: Diagnosis not present

## 2021-10-19 DIAGNOSIS — M109 Gout, unspecified: Secondary | ICD-10-CM | POA: Diagnosis present

## 2021-10-19 DIAGNOSIS — K219 Gastro-esophageal reflux disease without esophagitis: Secondary | ICD-10-CM | POA: Diagnosis present

## 2021-10-19 DIAGNOSIS — Z96651 Presence of right artificial knee joint: Secondary | ICD-10-CM | POA: Diagnosis present

## 2021-10-19 DIAGNOSIS — Z8371 Family history of colonic polyps: Secondary | ICD-10-CM

## 2021-10-19 DIAGNOSIS — Z8261 Family history of arthritis: Secondary | ICD-10-CM

## 2021-10-19 DIAGNOSIS — Z20822 Contact with and (suspected) exposure to covid-19: Secondary | ICD-10-CM | POA: Diagnosis present

## 2021-10-19 DIAGNOSIS — Z833 Family history of diabetes mellitus: Secondary | ICD-10-CM

## 2021-10-19 DIAGNOSIS — E7800 Pure hypercholesterolemia, unspecified: Secondary | ICD-10-CM | POA: Diagnosis present

## 2021-10-19 LAB — COMPREHENSIVE METABOLIC PANEL
ALT: 18 U/L (ref 0–44)
AST: 23 U/L (ref 15–41)
Albumin: 4.4 g/dL (ref 3.5–5.0)
Alkaline Phosphatase: 85 U/L (ref 38–126)
Anion gap: 9 (ref 5–15)
BUN: 11 mg/dL (ref 8–23)
CO2: 23 mmol/L (ref 22–32)
Calcium: 9.7 mg/dL (ref 8.9–10.3)
Chloride: 105 mmol/L (ref 98–111)
Creatinine, Ser: 0.63 mg/dL (ref 0.44–1.00)
GFR, Estimated: >60 mL/min (ref >60)
Glucose, Bld: 184 mg/dL — ABNORMAL HIGH (ref 70–99)
Potassium: 4.3 mmol/L (ref 3.5–5.1)
Sodium: 137 mmol/L (ref 135–145)
Total Bilirubin: 1.1 mg/dL (ref 0.3–1.2)
Total Protein: 8.6 g/dL — ABNORMAL HIGH (ref 6.5–8.1)

## 2021-10-19 LAB — CBC WITH DIFFERENTIAL/PLATELET
Abs Immature Granulocytes: 0.03 10*3/uL (ref 0.00–0.07)
Basophils Absolute: 0 10*3/uL (ref 0.0–0.1)
Basophils Relative: 0 %
Eosinophils Absolute: 0 10*3/uL (ref 0.0–0.5)
Eosinophils Relative: 1 %
HCT: 41.6 % (ref 36.0–46.0)
Hemoglobin: 13.3 g/dL (ref 12.0–15.0)
Immature Granulocytes: 0 %
Lymphocytes Relative: 19 %
Lymphs Abs: 1.7 10*3/uL (ref 0.7–4.0)
MCH: 26.5 pg (ref 26.0–34.0)
MCHC: 32 g/dL (ref 30.0–36.0)
MCV: 82.9 fL (ref 80.0–100.0)
Monocytes Absolute: 0.2 10*3/uL (ref 0.1–1.0)
Monocytes Relative: 3 %
Neutro Abs: 6.7 10*3/uL (ref 1.7–7.7)
Neutrophils Relative %: 77 %
Platelets: 336 10*3/uL (ref 150–400)
RBC: 5.02 MIL/uL (ref 3.87–5.11)
RDW: 13.6 % (ref 11.5–15.5)
WBC: 8.7 10*3/uL (ref 4.0–10.5)
nRBC: 0 % (ref 0.0–0.2)

## 2021-10-19 LAB — LIPASE, BLOOD: Lipase: 24 U/L (ref 11–51)

## 2021-10-19 MED ORDER — IOHEXOL 350 MG/ML SOLN
80.0000 mL | Freq: Once | INTRAVENOUS | Status: AC | PRN
  Administered 2021-10-19: 80 mL via INTRAVENOUS

## 2021-10-19 MED ORDER — ONDANSETRON HCL 4 MG/2ML IJ SOLN
4.0000 mg | Freq: Once | INTRAMUSCULAR | Status: AC
  Administered 2021-10-20: 4 mg via INTRAVENOUS
  Filled 2021-10-19: qty 2

## 2021-10-19 MED ORDER — ONDANSETRON HCL 4 MG/2ML IJ SOLN
4.0000 mg | Freq: Once | INTRAMUSCULAR | Status: AC
  Administered 2021-10-19: 4 mg via INTRAVENOUS
  Filled 2021-10-19: qty 2

## 2021-10-19 MED ORDER — PIPERACILLIN-TAZOBACTAM 3.375 G IVPB 30 MIN
3.3750 g | Freq: Once | INTRAVENOUS | Status: AC
  Administered 2021-10-20: 3.375 g via INTRAVENOUS
  Filled 2021-10-19: qty 50

## 2021-10-19 MED ORDER — SODIUM CHLORIDE 0.9 % IV BOLUS
1000.0000 mL | Freq: Once | INTRAVENOUS | Status: AC
  Administered 2021-10-20: 1000 mL via INTRAVENOUS

## 2021-10-19 MED ORDER — ONDANSETRON 4 MG PO TBDP: 4.0000 mg | ORAL_TABLET | Freq: Once | ORAL | Status: DC

## 2021-10-19 MED ORDER — MORPHINE SULFATE (PF) 4 MG/ML IV SOLN
4.0000 mg | Freq: Once | INTRAVENOUS | Status: AC
  Administered 2021-10-20: 4 mg via INTRAVENOUS
  Filled 2021-10-19: qty 1

## 2021-10-19 NOTE — ED Triage Notes (Signed)
Pt reports with abdominal pain and vomiting since this evening. Pt reports having a hx of bowel obstruction.

## 2021-10-19 NOTE — ED Notes (Addendum)
Attempted lab draw, PA, Lorin requesting pt. needing IV.

## 2021-10-19 NOTE — ED Provider Notes (Signed)
Emergency Medicine Provider Triage Evaluation Note  Bianca Campbell , a 66 y.o. female  was evaluated in triage.  Pt complains of abdominal pain and vomiting. Patient states pain started in her LLQ and felt like a "pinch" and she continued to have abdominal spasms. She reports 3 episode of emesis today. Normal bowel movements but patient states has had multiple bowel obstructions previously, and these were always her initial symptoms.    Review of Systems  Positive: Abdominal pain, nausea, vomiting Negative: Fever, chills, diarrhea  Physical Exam  BP (!) 141/91 (BP Location: Left Arm)    Pulse 87    Temp 99.2 F (37.3 C) (Oral)    Resp 16    Ht 4\' 11"  (1.499 m)    Wt 96.6 kg    SpO2 99%    BMI 43.02 kg/m  Gen:   Awake, no distress   Resp:  Normal effort  MSK:   Moves extremities without difficulty  Other:    Medical Decision Making  Medically screening exam initiated at 9:14 PM.  Appropriate orders placed.  LADENE ALLOCCA was informed that the remainder of the evaluation will be completed by another provider, this initial triage assessment does not replace that evaluation, and the importance of remaining in the ED until their evaluation is complete.  Will obtain labs and imaging   [name] 10/19/21 [id number]    2120, MD 10/20/21 [phone]

## 2021-10-19 NOTE — ED Provider Notes (Addendum)
[facility] DEPT Provider Note   CSN: [id number] Arrival date & time: 10/19/21  1904     History  Chief Complaint  Patient presents with   Abdominal Pain   Emesis    Bianca Campbell is a 66 y.o. female.  HPI     This is a 66 year old female with a history of hypertension, bowel obstruction, multiple abdominal surgeries who presents with nausea, vomiting, abdominal pain.  Patient reports crampy diffuse abdominal pain that started around 2 PM today.  She has had similar pain in the past associated with her bowel obstructions and hernias.  She has had multiple episodes of nonbilious, nonbloody emesis.  Last normal bowel movement was 4 PM.  She took some meloxicam with minimal relief.  Currently she rates her pain around 8 out of 10.  Denies fevers or urinary symptoms.  Home Medications Prior to Admission medications   Medication Sig Start Date End Date Taking? Authorizing Provider  amLODipine (NORVASC) 10 MG tablet Take 1 tablet (10 mg total) by mouth daily. 06/24/20   Biagio Borg, MD  cetirizine (ZYRTEC) 10 MG tablet Take 1 tablet (10 mg total) by mouth daily. 06/20/18 06/20/19  Biagio Borg, MD  Cholecalciferol (VITAMIN D3) 5000 units CAPS Take 5,000 Units by mouth daily.  Patient not taking: Reported on 07/13/2021    [provider]  Cyanocobalamin (VITAMIN B 12 PO) Take 1 tablet by mouth daily. Patient not taking: Reported on 07/13/2021    [provider]  diclofenac (VOLTAREN) 75 MG EC tablet Take 75 mg by mouth daily as needed for mild pain. Patient not taking: Reported on 07/13/2021    [provider]  dicyclomine (BENTYL) 20 MG tablet TAKE ONE TABLET BY MOUTH THREE TIMES DAILY BEFORE MEAL(S) 12/19/18   Biagio Borg, MD  Digestive Enzyme CAPS Take 1 capsule by mouth 3 (three) times daily. Patient not taking: Reported on 07/13/2021    [provider]  irbesartan (AVAPRO) 300 MG tablet Take 1 tablet (300 mg total) by  mouth daily. 06/24/20   Biagio Borg, MD  Licorice, Glycyrrhiza glabra, (LICORICE PO) Take 1 capsule by mouth 3 (three) times daily. Will switch from capsules to chews depending. Will take 3 at the end of meal if taking the chews Patient not taking: Reported on 07/13/2021    [provider]  meclizine (ANTIVERT) 12.5 MG tablet TAKE 1 TABLET BY MOUTH THREE TIMES DAILY AS NEEDED FOR DIZZINESS 01/09/20   Biagio Borg, MD  meloxicam (MOBIC) 15 MG tablet TAKE 1 TABLET BY MOUTH ONCE DAILY AS NEEDED FOR PAIN 03/25/20   Biagio Borg, MD  methylPREDNISolone acetate (DEPO-MEDROL) 40 MG/ML injection Inject 80 mg into the muscle once. Patient not taking: Reported on 07/13/2021 05/30/20   [provider]  ondansetron (ZOFRAN) 4 MG tablet Take 1 tablet (4 mg total) by mouth every 8 (eight) hours as needed for nausea or vomiting. Patient not taking: Reported on 07/13/2021 12/19/18   Biagio Borg, MD  OVER THE COUNTER MEDICATION Take 1 tablet by mouth 3 (three) times daily as needed (for pain). Curamin Extra Strength Patient not taking: Reported on 07/13/2021    [provider]  PERCOCET 10-325 MG tablet Take 1 tablet by mouth. Patient not taking: Reported on 07/13/2021 05/30/20   [provider]  Vitamin D, Ergocalciferol, (DRISDOL) 1.25 MG (50000 UNIT) CAPS capsule Take 1 capsule (50,000 Units total) by mouth every 7 (seven) days. 06/25/20  Biagio Borg, MD      Allergies    Latex    Review of Systems   Review of Systems  Constitutional:  Negative for fever.  Respiratory:  Negative for shortness of breath.   Cardiovascular:  Negative for chest pain.  Gastrointestinal:  Positive for abdominal pain, nausea and vomiting. Negative for blood in stool, constipation and diarrhea.  Genitourinary:  Negative for dysuria.  All other systems reviewed and are negative.  Physical Exam Updated Vital Signs BP (!) 180/110    Pulse 93    Temp 99.2 F (37.3 C) (Oral)    Resp 13    Ht 1.499 m  (4\' 11" )    Wt 96.6 kg    SpO2 96%    BMI 43.02 kg/m  Physical Exam Vitals and nursing note reviewed.  Constitutional:      Appearance: She is well-developed. She is obese. She is not ill-appearing.  HENT:     Head: Normocephalic and atraumatic.     Mouth/Throat:     Mouth: Mucous membranes are moist.  Eyes:     Pupils: Pupils are equal, round, and reactive to light.  Cardiovascular:     Rate and Rhythm: Normal rate and regular rhythm.     Heart sounds: Normal heart sounds.  Pulmonary:     Effort: Pulmonary effort is normal. No respiratory distress.     Breath sounds: No wheezing.  Abdominal:     General: Bowel sounds are normal.     Palpations: Abdomen is soft.     Tenderness: There is generalized abdominal tenderness. There is no guarding or rebound.     Hernia: No hernia is present.  Musculoskeletal:     Cervical back: Neck supple.  Skin:    General: Skin is warm and dry.  Neurological:     Mental Status: She is alert and oriented to person, place, and time.  Psychiatric:        Mood and Affect: Mood normal.    ED Results / Procedures / Treatments   Labs (all labs ordered are listed, but only abnormal results are displayed) Labs Reviewed  COMPREHENSIVE METABOLIC PANEL - Abnormal; Notable for the following components:      Result Value   Glucose, Bld 184 (*)    Total Protein 8.6 (*)    All other components within normal limits  CBC WITH DIFFERENTIAL/PLATELET  LIPASE, BLOOD  URINALYSIS, ROUTINE W REFLEX MICROSCOPIC    EKG None  Radiology CT ABDOMEN PELVIS W CONTRAST  Result Date: 10/19/2021 CLINICAL DATA:  Evaluate for bowel obstruction. EXAM: CT ABDOMEN AND PELVIS WITH CONTRAST TECHNIQUE: Multidetector CT imaging of the abdomen and pelvis was performed using the standard protocol following bolus administration of intravenous contrast. CONTRAST:  52mL OMNIPAQUE IOHEXOL 350 MG/ML SOLN COMPARISON:  CT abdomen and pelvis 08/25/2018. FINDINGS: Lower chest: No acute  abnormality. Hepatobiliary: No focal liver abnormality is seen. Status post cholecystectomy. No biliary dilatation. Pancreas: Unremarkable. No pancreatic ductal dilatation or surrounding inflammatory changes. Spleen: Normal in size without focal abnormality. Adrenals/Urinary Tract: Adrenal glands are unremarkable. Kidneys are normal, without renal calculi, focal lesion, or hydronephrosis. Bladder is unremarkable. Stomach/Bowel: There are fluid-filled mildly dilated central small bowel loops, many of which approximate the anterior abdominal wall. There are few tiny foci of extraluminal gas in the anterior abdomen adjacent to dilated small bowel loops image 2/49 and 50. There is no definitive wall thickening. Definitive transition point is not seen, but distal small bowel is decompressed in the  pelvis. Colon and appendix are within normal limits. There is a small hiatal hernia. The stomach is otherwise within normal limits. Vascular/Lymphatic: No significant vascular findings are present. No enlarged abdominal or pelvic lymph nodes. Reproductive: Uterus and bilateral adnexa are unremarkable. Other: There is no ascites. There is scarring in the anterior abdominal wall. Musculoskeletal: No acute or significant osseous findings. IMPRESSION: Dilated central small bowel loops approximating the anterior abdominal wall worrisome for small bowel obstruction, likely secondary to adhesions. There is a small amount of extraluminal air suspicious for micro perforation. Electronically Signed   By: Ronney Asters M.D.   On: 10/19/2021 23:08    Procedures Procedures    Medications Ordered in ED Medications  morphine 4 MG/ML injection 4 mg (has no administration in time range)  ondansetron (ZOFRAN) injection 4 mg (has no administration in time range)  sodium chloride 0.9 % bolus 1,000 mL (has no administration in time range)  piperacillin-tazobactam (ZOSYN) IVPB 3.375 g (has no administration in time range)  ondansetron  (ZOFRAN) injection 4 mg (4 mg Intravenous Given 10/19/21 2154)  iohexol (OMNIPAQUE) 350 MG/ML injection 80 mL (80 mLs Intravenous Contrast Given 10/19/21 2247)    ED Course/ Medical Decision Making/ A&P Clinical Course as of 10/19/21 2356  Tue Oct 19, 2021  2355 Spoke with Dr. Barry Dienes.  Agrees with admission, NG tube, antibiotics given CT read of microperf although patient is fairly well-appearing. [CH]    Clinical Course User Index [CH] Dariona Postma, Barbette Hair, MD                           Medical Decision Making  This patient presents to the ED for concern of n/v and abdominal pain, this involves an extensive number of treatment options, and is a complaint that carries with it a high risk of complications and morbidity.  The differential diagnosis includes SBO, gastroenteritis, less likely cholecystitis or appendicitis   Co morbidities that complicate the patient evaluation Prior incarcerated hernia, SBO, obesity   Additional history obtained from patient, spouse External records from outside source obtained and reviewed including prior admissions   Labs: I Ordered, and personally interpreted labs.  The pertinent results include: No significant metabolic derangement   Imaging Studies ordered: I ordered imaging studies including CT I independently visualized and interpreted imaging which showed SBO with possible microperf/adhesions I agree with the radiologist interpretation   Cardiac Monitoring: The patient was maintained on a cardiac monitor.  I personally viewed and interpreted the cardiac monitored which showed an underlying rhythm of: n/a   Medicines  I ordered medication including zofran, morphine, zosyn  for pain and nausea as well as intraabdominal infection I have reviewed the patients home medicines and have made adjustments as needed   Critical Interventions: n/a   Consultations Obtained: I requested consultation with the general surgery,  and discussed lab and  imaging findings as well as pertinent plan - they recommend: admission with antibiotics   Problem List / ED Course: SBO   Reevaluation: After the interventions noted above, I reevaluated the patient and found that they have :improved   Social Determinants of Health: n/a   Dispostion: After consideration of the diagnostic results and the patients response to treatment, I feel that the patent would benefit from admission.         Final Clinical Impression(s) / ED Diagnoses Final diagnoses:  SBO (small bowel obstruction) ([place])    Rx / DC Orders ED Discharge Orders  None         Merryl Hacker, MD 10/19/21 [id number]    Merryl Hacker, MD 10/19/21 [phone]

## 2021-10-20 ENCOUNTER — Inpatient Hospital Stay (HOSPITAL_COMMUNITY): Payer: 59

## 2021-10-20 ENCOUNTER — Encounter (HOSPITAL_COMMUNITY): Payer: Self-pay | Admitting: Internal Medicine

## 2021-10-20 ENCOUNTER — Ambulatory Visit: Payer: 59 | Admitting: Internal Medicine

## 2021-10-20 DIAGNOSIS — K219 Gastro-esophageal reflux disease without esophagitis: Secondary | ICD-10-CM | POA: Diagnosis present

## 2021-10-20 DIAGNOSIS — Z825 Family history of asthma and other chronic lower respiratory diseases: Secondary | ICD-10-CM | POA: Diagnosis not present

## 2021-10-20 DIAGNOSIS — K56609 Unspecified intestinal obstruction, unspecified as to partial versus complete obstruction: Secondary | ICD-10-CM | POA: Diagnosis present

## 2021-10-20 DIAGNOSIS — Z79899 Other long term (current) drug therapy: Secondary | ICD-10-CM | POA: Diagnosis not present

## 2021-10-20 DIAGNOSIS — I10 Essential (primary) hypertension: Secondary | ICD-10-CM | POA: Diagnosis present

## 2021-10-20 DIAGNOSIS — R739 Hyperglycemia, unspecified: Secondary | ICD-10-CM | POA: Diagnosis present

## 2021-10-20 DIAGNOSIS — R112 Nausea with vomiting, unspecified: Secondary | ICD-10-CM | POA: Diagnosis present

## 2021-10-20 DIAGNOSIS — Z20822 Contact with and (suspected) exposure to covid-19: Secondary | ICD-10-CM | POA: Diagnosis present

## 2021-10-20 DIAGNOSIS — Z833 Family history of diabetes mellitus: Secondary | ICD-10-CM | POA: Diagnosis not present

## 2021-10-20 DIAGNOSIS — Z8261 Family history of arthritis: Secondary | ICD-10-CM | POA: Diagnosis not present

## 2021-10-20 DIAGNOSIS — K565 Intestinal adhesions [bands], unspecified as to partial versus complete obstruction: Secondary | ICD-10-CM | POA: Diagnosis present

## 2021-10-20 DIAGNOSIS — M109 Gout, unspecified: Secondary | ICD-10-CM | POA: Diagnosis present

## 2021-10-20 DIAGNOSIS — Z8371 Family history of colonic polyps: Secondary | ICD-10-CM | POA: Diagnosis not present

## 2021-10-20 DIAGNOSIS — E7800 Pure hypercholesterolemia, unspecified: Secondary | ICD-10-CM | POA: Diagnosis present

## 2021-10-20 DIAGNOSIS — Z96651 Presence of right artificial knee joint: Secondary | ICD-10-CM | POA: Diagnosis present

## 2021-10-20 DIAGNOSIS — Z91040 Latex allergy status: Secondary | ICD-10-CM | POA: Diagnosis not present

## 2021-10-20 LAB — URINALYSIS, ROUTINE W REFLEX MICROSCOPIC
Bilirubin Urine: NEGATIVE
Glucose, UA: NEGATIVE mg/dL
Hgb urine dipstick: NEGATIVE
Ketones, ur: 5 mg/dL — AB
Leukocytes,Ua: NEGATIVE
Nitrite: NEGATIVE
Protein, ur: NEGATIVE mg/dL
Specific Gravity, Urine: >1.046 — ABNORMAL HIGH (ref 1.005–1.030)
pH: 7 (ref 5.0–8.0)

## 2021-10-20 LAB — CBC WITH DIFFERENTIAL/PLATELET
Abs Immature Granulocytes: 0.03 10*3/uL (ref 0.00–0.07)
Basophils Absolute: 0 10*3/uL (ref 0.0–0.1)
Basophils Relative: 0 %
Eosinophils Absolute: 0.1 10*3/uL (ref 0.0–0.5)
Eosinophils Relative: 1 %
HCT: 37.6 % (ref 36.0–46.0)
Hemoglobin: 11.9 g/dL — ABNORMAL LOW (ref 12.0–15.0)
Immature Granulocytes: 0 %
Lymphocytes Relative: 12 %
Lymphs Abs: 1.1 10*3/uL (ref 0.7–4.0)
MCH: 26.3 pg (ref 26.0–34.0)
MCHC: 31.6 g/dL (ref 30.0–36.0)
MCV: 83.2 fL (ref 80.0–100.0)
Monocytes Absolute: 0.4 10*3/uL (ref 0.1–1.0)
Monocytes Relative: 5 %
Neutro Abs: 7.5 10*3/uL (ref 1.7–7.7)
Neutrophils Relative %: 82 %
Platelets: 302 10*3/uL (ref 150–400)
RBC: 4.52 MIL/uL (ref 3.87–5.11)
RDW: 13.7 % (ref 11.5–15.5)
WBC: 9.1 10*3/uL (ref 4.0–10.5)
nRBC: 0 % (ref 0.0–0.2)

## 2021-10-20 LAB — COMPREHENSIVE METABOLIC PANEL
ALT: 16 U/L (ref 0–44)
AST: 18 U/L (ref 15–41)
Albumin: 3.7 g/dL (ref 3.5–5.0)
Alkaline Phosphatase: 72 U/L (ref 38–126)
Anion gap: 8 (ref 5–15)
BUN: 8 mg/dL (ref 8–23)
CO2: 26 mmol/L (ref 22–32)
Calcium: 8.9 mg/dL (ref 8.9–10.3)
Chloride: 104 mmol/L (ref 98–111)
Creatinine, Ser: 0.6 mg/dL (ref 0.44–1.00)
GFR, Estimated: >60 mL/min (ref >60)
Glucose, Bld: 143 mg/dL — ABNORMAL HIGH (ref 70–99)
Potassium: 3.5 mmol/L (ref 3.5–5.1)
Sodium: 138 mmol/L (ref 135–145)
Total Bilirubin: 1 mg/dL (ref 0.3–1.2)
Total Protein: 7.8 g/dL (ref 6.5–8.1)

## 2021-10-20 LAB — HEMOGLOBIN A1C
Hgb A1c MFr Bld: 6.7 % — ABNORMAL HIGH (ref 4.8–5.6)
Mean Plasma Glucose: 145.59 mg/dL

## 2021-10-20 LAB — RESP PANEL BY RT-PCR (FLU A&B, COVID) ARPGX2
Influenza A by PCR: NEGATIVE
Influenza B by PCR: NEGATIVE
SARS Coronavirus 2 by RT PCR: NEGATIVE

## 2021-10-20 LAB — HIV ANTIBODY (ROUTINE TESTING W REFLEX): HIV Screen 4th Generation wRfx: NONREACTIVE

## 2021-10-20 LAB — MAGNESIUM: Magnesium: 2 mg/dL (ref 1.7–2.4)

## 2021-10-20 MED ORDER — HYDRALAZINE HCL 20 MG/ML IJ SOLN: 10.0000 mg | Freq: Four times a day (QID) | INTRAMUSCULAR | Status: DC | PRN

## 2021-10-20 MED ORDER — ACETAMINOPHEN 650 MG RE SUPP: 650.0000 mg | Freq: Four times a day (QID) | RECTAL | Status: DC | PRN

## 2021-10-20 MED ORDER — PANTOPRAZOLE SODIUM 40 MG IV SOLR
40.0000 mg | Freq: Every day | INTRAVENOUS | Status: DC
  Administered 2021-10-20 – 2021-10-22 (×4): 40 mg via INTRAVENOUS
  Filled 2021-10-20 (×5): qty 40

## 2021-10-20 MED ORDER — ONDANSETRON HCL 4 MG/2ML IJ SOLN
4.0000 mg | Freq: Four times a day (QID) | INTRAMUSCULAR | Status: DC | PRN
  Administered 2021-10-20 (×3): 4 mg via INTRAVENOUS
  Filled 2021-10-20 (×2): qty 2

## 2021-10-20 MED ORDER — POTASSIUM CHLORIDE 10 MEQ/100ML IV SOLN
10.0000 meq | INTRAVENOUS | Status: AC
  Administered 2021-10-20 (×4): 10 meq via INTRAVENOUS
  Filled 2021-10-20 (×4): qty 100

## 2021-10-20 MED ORDER — POLYETHYLENE GLYCOL 3350 17 G PO PACK: 17.0000 g | PACK | Freq: Every day | ORAL | Status: DC | PRN

## 2021-10-20 MED ORDER — ENALAPRILAT 1.25 MG/ML IV SOLN
1.2500 mg | Freq: Four times a day (QID) | INTRAVENOUS | Status: DC
  Administered 2021-10-20 – 2021-10-23 (×12): 1.25 mg via INTRAVENOUS
  Filled 2021-10-20 (×18): qty 1

## 2021-10-20 MED ORDER — DIATRIZOATE MEGLUMINE & SODIUM 66-10 % PO SOLN
90.0000 mL | Freq: Once | ORAL | Status: AC
  Administered 2021-10-20: 90 mL via NASOGASTRIC
  Filled 2021-10-20: qty 90

## 2021-10-20 MED ORDER — MORPHINE SULFATE (PF) 2 MG/ML IV SOLN
2.0000 mg | INTRAVENOUS | Status: DC | PRN
  Administered 2021-10-20: 2 mg via INTRAVENOUS
  Filled 2021-10-20: qty 1

## 2021-10-20 MED ORDER — MORPHINE SULFATE (PF) 4 MG/ML IV SOLN
4.0000 mg | INTRAVENOUS | Status: DC | PRN
  Administered 2021-10-20 – 2021-10-21 (×2): 4 mg via INTRAVENOUS
  Filled 2021-10-20 (×2): qty 1

## 2021-10-20 MED ORDER — POTASSIUM CHLORIDE 2 MEQ/ML IV SOLN
INTRAVENOUS | Status: DC
  Filled 2021-10-20 (×13): qty 1000

## 2021-10-20 MED ORDER — ENOXAPARIN SODIUM 40 MG/0.4ML IJ SOSY
40.0000 mg | PREFILLED_SYRINGE | INTRAMUSCULAR | Status: DC
  Administered 2021-10-20 – 2021-10-24 (×5): 40 mg via SUBCUTANEOUS
  Filled 2021-10-20 (×5): qty 0.4

## 2021-10-20 MED ORDER — ACETAMINOPHEN 325 MG PO TABS
650.0000 mg | ORAL_TABLET | Freq: Four times a day (QID) | ORAL | Status: DC | PRN
  Administered 2021-10-22: 650 mg
  Filled 2021-10-20: qty 2

## 2021-10-20 MED ORDER — ONDANSETRON HCL 4 MG PO TABS: 4.0000 mg | ORAL_TABLET | Freq: Four times a day (QID) | ORAL | Status: DC | PRN

## 2021-10-20 MED ORDER — PIPERACILLIN-TAZOBACTAM 3.375 G IVPB
3.3750 g | Freq: Three times a day (TID) | INTRAVENOUS | Status: DC
  Administered 2021-10-20 – 2021-10-23 (×8): 3.375 g via INTRAVENOUS
  Filled 2021-10-20 (×10): qty 50

## 2021-10-20 MED ORDER — LIDOCAINE HCL URETHRAL/MUCOSAL 2 % EX GEL
CUTANEOUS | Status: AC
  Filled 2021-10-20: qty 11

## 2021-10-20 MED ORDER — PANTOPRAZOLE SODIUM 40 MG IV SOLR
INTRAVENOUS | Status: AC
  Filled 2021-10-20: qty 40

## 2021-10-20 MED ORDER — MORPHINE SULFATE (PF) 4 MG/ML IV SOLN
INTRAVENOUS | Status: AC
  Filled 2021-10-20: qty 1

## 2021-10-20 MED ORDER — ONDANSETRON HCL 4 MG/2ML IJ SOLN
INTRAMUSCULAR | Status: AC
  Filled 2021-10-20: qty 2

## 2021-10-20 NOTE — Assessment & Plan Note (Signed)
·   Possibly stress-induced, obtaining hemoglobin A1c

## 2021-10-20 NOTE — Assessment & Plan Note (Signed)
·   Holding all oral antihypertensives  Instead, placing patient on scheduled Vasotec every 6 hours  We will additionally provide patient with as needed intravenous hydralazine for markedly elevated blood pressure

## 2021-10-20 NOTE — ED Notes (Signed)
Advanced NG tube 5cm per Shalhoub MD. Repeat confirmation xray ordered.

## 2021-10-20 NOTE — Assessment & Plan Note (Signed)
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  Did not exhibit any evidence of fever, leukocytosis and has a relatively soft abdomen   After ER provider discussed case with Dr. Barry Dienes with general surgery decision was made to place patient on empiric antibiotics for now  Dr. Barry Dienes with general surgery to consult on patient in the morning, their assistance is appreciated.  Proceeding with NG tube placement which will be set to low intermittent suction  Hydrating patient with intravenous isotonic fluids  Serial abdominal exams  As needed antiemetics and opiate-based analgesics  Serial abdominal x-rays

## 2021-10-20 NOTE — Progress Notes (Signed)
PROGRESS NOTE    Bianca Campbell  [id number] DOB: 1956-01-26 DOA: 10/19/2021 PCP: [facility]    Chief Complaint  Patient presents with   Abdominal Pain   Emesis    Brief Narrative:  Patient 66 year old female history of multiple abdominal surgeries in the past, history of frequent hospitalizations for SBO, hypertension, GERD, gout presenting to the ED with complaints of abdominal pain nausea vomiting.  CT imaging done concerning for small bowel obstruction likely secondary to adhesions.  There is also some concern for microperforations.  Patient placed empirically on IV antibiotics.  NG tube placed.  General surgery consulted.   Assessment & Plan:   Principal Problem:   Small bowel obstruction due to adhesions [facility](HCC) Active Problems:   Essential hypertension   GERD without esophagitis   Hyperglycemia  #1 small bowel obstruction likely secondary to adhesions, POA -Patient with history of multiple abdominal surgeries, history of multiple small bowel obstructions.  Secondary to adhesions. -Patient presented abdominal pain, nausea vomiting, CT scan concerning for small bowel obstruction with associated microperforation. -Patient afebrile, normal white count, no rebound or guarding on examination -NG tube placed with low intermittent suction with bilious material noted. -Continue IV Zosyn. -Replete electrolytes to keep potassium approximately 4, magnesium approximately 2. -General surgery consulted and small bowel protocol underway. -If no improvement with conservative treatment would likely need surgical exploration. -IV fluids, supportive care, bowel rest. -Per general surgery.  2.  Hypertension -Currently bowel rest and as such holding oral antihypertensive medications. -Continue IV Vasotec.  3.  GERD without esophagitis -IV PPI.  4.  Hyperglycemia -Likely stress-induced. -Hemoglobin A1c 6.7. -We will need outpatient follow-up.   DVT prophylaxis:  Lovenox Code Status: Full Family Communication: Updated patient.  No family at bedside. Disposition:   Status is: Inpatient  Remains inpatient appropriate because: Severity of illness       Consultants:  General surgery: Dr. Freida Campbell 10/20/2021  Procedures:  CT abdomen and pelvis 10/19/2021 Small bowel protocol   Antimicrobials: IV Zosyn 10/20/2021>>>>>   Subjective: On gurney. NGT with bilious, NO CP, No sob.   Objective: Vitals:   10/20/21 1100 10/20/21 1115 10/20/21 1130 10/20/21 1134  BP: (!) 142/86  (!) 150/89 (!) 150/89  Pulse:    73  Resp: 12 18 14 12   Temp:      TempSrc:      SpO2:    95%  Weight:      Height:       No intake or output data in the 24 hours ending 10/20/21 1141 Filed Weights   10/19/21 2015  Weight: 96.6 kg    Examination:  General exam: Appears calm and comfortable  Respiratory system: Clear to auscultation anterior lung fields.  No wheezes, no crackles, no rhonchi.  Normal respiratory effort. Cardiovascular system: S1 & S2 heard, RRR. No JVD, murmurs, rubs, gallops or clicks. No pedal edema. Gastrointestinal system: Abdomen is soft. ND, upper abd and mid abd region TTP. Hypoactive BS.  No rebound.  No guarding. Central nervous system: Alert and oriented. No focal neurological deficits. Extremities: Symmetric 5 x 5 power. Skin: No rashes, lesions or ulcers Psychiatry: Judgement and insight appear normal. Mood & affect appropriate.     Data Reviewed: I have personally reviewed following labs and imaging studies  CBC: Recent Labs  Lab 10/19/21 2140 10/20/21 0550  WBC 8.7 9.1  NEUTROABS 6.7 7.5  HGB 13.3 11.9*  HCT 41.6 37.6  MCV 82.9 83.2  PLT 336 302  Basic Metabolic Panel: Recent Labs  Lab 10/19/21 2140 10/20/21 0550  NA 137 138  K 4.3 3.5  CL 105 104  CO2 23 26  GLUCOSE 184* 143*  BUN 11 8  CREATININE 0.63 0.60  CALCIUM 9.7 8.9  MG  --  2.0    GFR: Estimated Creatinine Clearance: 72.5 mL/min (by C-G formula  based on SCr of 0.6 mg/dL).  Liver Function Tests: Recent Labs  Lab 10/19/21 2140 10/20/21 0550  AST 23 18  ALT 18 16  ALKPHOS 85 72  BILITOT 1.1 1.0  PROT 8.6* 7.8  ALBUMIN 4.4 3.7    CBG: No results for input(s): GLUCAP in the last 168 hours.   Recent Results (from the past 240 hour(s))  Resp Panel by RT-PCR (Flu A&B, Covid) Nasopharyngeal Swab     Status: None   Collection Time: 10/20/21  2:30 AM   Specimen: Nasopharyngeal Swab; Nasopharyngeal(NP) swabs in vial transport medium  Result Value Ref Range Status   SARS Coronavirus 2 by RT PCR NEGATIVE NEGATIVE Final    Comment: (NOTE) SARS-CoV-2 target nucleic acids are NOT DETECTED.  The SARS-CoV-2 RNA is generally detectable in upper respiratory specimens during the acute phase of infection. The lowest concentration of SARS-CoV-2 viral copies this assay can detect is 138 copies/mL. A negative result does not preclude SARS-Cov-2 infection and should not be used as the sole basis for treatment or other patient management decisions. A negative result may occur with  improper specimen collection/handling, submission of specimen other than nasopharyngeal swab, presence of viral mutation(s) within the areas targeted by this assay, and inadequate number of viral copies(<138 copies/mL). A negative result must be combined with clinical observations, patient history, and epidemiological information. The expected result is Negative.  Fact Sheet for Patients:  BloggerCourse.com  Fact Sheet for Healthcare Providers:  SeriousBroker.it  This test is no t yet approved or cleared by the [place] FDA and  has been authorized for detection and/or diagnosis of SARS-CoV-2 by FDA under an Emergency Use Authorization (EUA). This EUA will remain  in effect (meaning this test can be used) for the duration of the COVID-19 declaration under Section 564(b)(1) of the Act, 21 U.S.C.section  360bbb-3(b)(1), unless the authorization is terminated  or revoked sooner.       Influenza A by PCR NEGATIVE NEGATIVE Final   Influenza B by PCR NEGATIVE NEGATIVE Final    Comment: (NOTE) The Xpert Xpress SARS-CoV-2/FLU/RSV plus assay is intended as an aid in the diagnosis of influenza from Nasopharyngeal swab specimens and should not be used as a sole basis for treatment. Nasal washings and aspirates are unacceptable for Xpert Xpress SARS-CoV-2/FLU/RSV testing.  Fact Sheet for Patients: BloggerCourse.com  Fact Sheet for Healthcare Providers: SeriousBroker.it  This test is not yet approved or cleared by the [place] FDA and has been authorized for detection and/or diagnosis of SARS-CoV-2 by FDA under an Emergency Use Authorization (EUA). This EUA will remain in effect (meaning this test can be used) for the duration of the COVID-19 declaration under Section 564(b)(1) of the Act, 21 U.S.C. section 360bbb-3(b)(1), unless the authorization is terminated or revoked.  Performed at [facility], [address] [place], [place] [address]          Radiology Studies: CT ABDOMEN PELVIS W CONTRAST  Result Date: 10/19/2021 CLINICAL DATA:  Evaluate for bowel obstruction. EXAM: CT ABDOMEN AND PELVIS WITH CONTRAST TECHNIQUE: Multidetector CT imaging of the abdomen and pelvis was performed using the standard  protocol following bolus administration of intravenous contrast. CONTRAST:  80mL OMNIPAQUE IOHEXOL 350 MG/ML SOLN COMPARISON:  CT abdomen and pelvis 08/25/2018. FINDINGS: Lower chest: No acute abnormality. Hepatobiliary: No focal liver abnormality is seen. Status post cholecystectomy. No biliary dilatation. Pancreas: Unremarkable. No pancreatic ductal dilatation or surrounding inflammatory changes. Spleen: Normal in size without focal abnormality. Adrenals/Urinary Tract: Adrenal glands are unremarkable. Kidneys are  normal, without renal calculi, focal lesion, or hydronephrosis. Bladder is unremarkable. Stomach/Bowel: There are fluid-filled mildly dilated central small bowel loops, many of which approximate the anterior abdominal wall. There are few tiny foci of extraluminal gas in the anterior abdomen adjacent to dilated small bowel loops image 2/49 and 50. There is no definitive wall thickening. Definitive transition point is not seen, but distal small bowel is decompressed in the pelvis. Colon and appendix are within normal limits. There is a small hiatal hernia. The stomach is otherwise within normal limits. Vascular/Lymphatic: No significant vascular findings are present. No enlarged abdominal or pelvic lymph nodes. Reproductive: Uterus and bilateral adnexa are unremarkable. Other: There is no ascites. There is scarring in the anterior abdominal wall. Musculoskeletal: No acute or significant osseous findings. IMPRESSION: Dilated central small bowel loops approximating the anterior abdominal wall worrisome for small bowel obstruction, likely secondary to adhesions. There is a small amount of extraluminal air suspicious for micro perforation. Electronically Signed   By: Darliss CheneyAmy  Guttmann M.D.   On: 10/19/2021 23:08   DG Abd 2 Views  Result Date: 10/20/2021 CLINICAL DATA:  Small bowel obstruction. EXAM: ABDOMEN - 2 VIEW COMPARISON:  Earlier same day FINDINGS: No gaseous small bowel dilatation to suggest overt obstruction. Gas-filled small bowel loop in the medial right abdomen shows 2.6 cm diameter. Colon is largely gasless is well. Contrast material in the urinary bladder is compatible with CT scan yesterday. NG tube tip is at or near the pylorus. IMPRESSION: No gas-filled dilated small bowel loops to suggest bowel obstruction. NG tube tip is positioned in the pyloric region of the stomach. Electronically Signed   By: Kennith CenterEric  Mansell M.D.   On: 10/20/2021 07:49   DG Abd Portable 1 View  Result Date: 10/20/2021 CLINICAL DATA:   NG tube placement. EXAM: PORTABLE ABDOMEN - 1 VIEW COMPARISON:  10/20/2021. FINDINGS: The bowel gas pattern is normal. An enteric tube terminates over the stomach. No abnormal calcification is seen. Surgical clips are present in the right upper quadrant. Excreted contrast is noted in the renal collecting systems. The cardiac silhouette is enlarged. IMPRESSION: The enteric tube terminates in the stomach. Electronically Signed   By: Thornell SartoriusLaura  Taylor M.D.   On: 10/20/2021 04:23   DG Abd Portable 1 View  Result Date: 10/20/2021 CLINICAL DATA:  NG tube placement. EXAM: PORTABLE ABDOMEN - 1 VIEW COMPARISON:  10/19/2021. FINDINGS: The bowel gas pattern is normal. An enteric tube terminates over the stomach. Excreted contrast is noted in the renal collecting systems bilaterally and urinary bladder. IMPRESSION: An enteric tube terminates over the stomach. Electronically Signed   By: Thornell SartoriusLaura  Taylor M.D.   On: 10/20/2021 01:42        Scheduled Meds:  enalaprilat  1.25 mg Intravenous Q6H   enoxaparin (LOVENOX) injection  40 mg Subcutaneous Q24H   pantoprazole (PROTONIX) IV  40 mg Intravenous QHS   Continuous Infusions:  lactated ringers with kcl 125 mL/hr at 10/20/21 1125   piperacillin-tazobactam (ZOSYN)  IV 3.375 g (10/20/21 [id number])   potassium chloride 10 mEq (10/20/21 1122)     LOS: 0 days  Time spent: 35 minutes    Ramiro Harvest, [facility]   To contact the attending provider between 7A-7P or the covering provider during after hours 7P-7A, please log into the web site [email or web address] and access using universal [place] password for that web site. If you do not have the password, please call the hospital operator.  10/20/2021, 11:41 AM

## 2021-10-20 NOTE — Progress Notes (Signed)
Pharmacy Antibiotic Note  Bianca Campbell is a 66 y.o. female admitted on 10/19/2021 with  intra-abdominal infection .  Pharmacy has been consulted for Zosyn dosing. Renal function at baseline- CrCl>72ml/min  Plan: Zosyn 3.375g IV q8h (4 hour infusion). No dose adjustments anticipated.  Pharmacy will sign off and monitor peripherally via electronic surveillance software for any changes in renal function or micro data.   Height: 4\' 11"  (149.9 cm) Weight: 96.6 kg (213 lb) IBW/kg (Calculated) : 43.2  Temp (24hrs), Avg:99.2 F (37.3 C), Min:99.2 F (37.3 C), Max:99.2 F (37.3 C)  Recent Labs  Lab 10/19/21 2140  WBC 8.7  CREATININE 0.63    Estimated Creatinine Clearance: 72.5 mL/min (by C-G formula based on SCr of 0.63 mg/dL).    Allergies  Allergen Reactions   Latex Itching     Thank you for allowing pharmacy to be a part of this patients care.  12/17/21 PharmD 10/20/2021 1:54 AM

## 2021-10-20 NOTE — H&P (Signed)
History and Physical    Bianca BoxerLinda P Salata ZOX:[id number] DOB: 02/22/1956 DOA: 10/19/2021  PCP: Corwin LevinsJohn, James W, MD  Patient coming from: Home   Chief Complaint:  Chief Complaint  Patient presents with   Abdominal Pain   Emesis     HPI:    66 year old female with past medical history of multiple abdominal surgeries as well as frequent hospitalizations for small bowel obstructions, hypertension, gastroesophageal reflux disease, gout who [facility] with complaints of abdominal pain nausea and vomiting.  Patient explains that around 12:00PM on 1/3 patient had a vegetable smoothie with several spoonfuls of flaxseeds blended in.  That afternoon at approximately 2 PM she began to experience abdominal pain.  Pain is diffuse in location without radiation.  Pain is severe in intensity without any alleviating or exacerbating factors.  Patient is also been experiencing intense nausea with multiple episodes of nonbilious nonbloody vomiting.  Patient did have a bowel movement this afternoon that was nonbloody.  Patient denies any dysuria low back pain or flank pain.  Patient denies any sick contacts or fever.  Patient denies any recent ingestion of undercooked food.  Due to patient's rapidly worsening symptoms the patient eventually presented [facility] for evaluation.  Upon evaluation in the emergency department CT imaging of the abdomen pelvis revealed evidence of a small bowel obstruction, likely secondary to adhesions.  Radiology questioned the presence of a microperforation.  ER provider then discussed case with Dr. Donell BeersByerly who agreed with initiation of antibiotics although felt microperforation was unlikely.  Dr. Donell BeersByerly agreed to see the patient in consultation.  The hospitalist group is now been called to assess patient for admission the hospital.  Review of Systems:   Review of Systems  Gastrointestinal:  Positive for  abdominal pain, nausea and vomiting.  All other systems reviewed and are negative.  Past Medical History:  Diagnosis Date   ALLERGIC RHINITIS 09/11/2007   Anemia, unspecified 08/31/2012   Arthritis    "knees" (11/23/2016)   BACK PAIN    EXTERNAL OTITIS 09/11/2007   GERD 06/06/2007   GOUT 11/14/2007   High cholesterol    HYPERTENSION 06/06/2007   INTERMITTENT VERTIGO 09/11/2007   Irritable bowel syndrome 06/06/2007   LOW BACK PAIN 06/06/2007   OSTEOARTHRITIS 06/06/2007   PONV (postoperative nausea and vomiting)    Primary localized osteoarthritis of right knee    SHOULDER PAIN, RIGHT 12/30/2008    Past Surgical History:  Procedure Laterality Date   5824 HOUR PH STUDY N/A 05/27/2019   Procedure: 24 HOUR PH STUDY;  Surgeon: Beverley FiedlerPyrtle, Jay M, MD;  Location: [facility] ENDOSCOPY;  Service: Gastroenterology;  Laterality: N/A;   BILATERAL SALPINGOOPHORECTOMY     CESAREAN SECTION  1987, 1991   COLONOSCOPY     pt stated 2015 or 2016 unknown md   ESOPHAGEAL MANOMETRY N/A 05/27/2019   Procedure: ESOPHAGEAL MANOMETRY (EM) WITH IMPEDENCE STUDY;  Surgeon: Beverley FiedlerPyrtle, Jay M, MD;  Location: [facility]OS[facility];  Service: Gastroenterology;  Laterality: N/A;   EXPLORATORY LAPAROTOMY     bowel obstruction   INGUINAL HERNIA REPAIR Bilateral    numerous hernia repairs 1996 to 2011   JOINT REPLACEMENT     KNEE ARTHROSCOPY Bilateral 2013 - 11/11/2015   LAPAROSCOPIC CHOLECYSTECTOMY  1991   s/p incarcerated hernia and ovary cyst Jan 2011  2011   TOTAL KNEE ARTHROPLASTY Right 11/21/2016   Procedure: RIGHT TOTAL KNEE ARTHROPLASTY;  Surgeon: Salvatore Marvelobert Wainer, MD;  [facility];  Service:  Orthopedics;  Laterality: Right;   UMBILICAL HERNIA REPAIR     numerous hernia repairs 1996 to 2011     reports that she has never smoked. She has never used smokeless tobacco. She reports that she does not drink alcohol and does not use drugs.  Allergies  Allergen Reactions   Latex Itching    Family History  Problem Relation Age of Onset    Arthritis Mother        RA   Diabetes Mother    Seizures Mother    Colon polyps Father 50   Healthy Sister    Healthy Brother    Healthy Son    Healthy Daughter    Asthma Daughter    Colon cancer Neg Hx    Esophageal cancer Neg Hx    Rectal cancer Neg Hx    Stomach cancer Neg Hx      Prior to Admission medications   Medication Sig Start Date End Date Taking? Authorizing Provider  amLODipine (NORVASC) 10 MG tablet Take 1 tablet (10 mg total) by mouth daily. 06/24/20   [name], MD  cetirizine (ZYRTEC) 10 MG tablet Take 1 tablet (10 mg total) by mouth daily. 06/20/18 06/20/19  [name], MD  Cholecalciferol (VITAMIN D3) 5000 units CAPS Take 5,000 Units by mouth daily.  Patient not taking: Reported on 07/13/2021    [provider]  Cyanocobalamin (VITAMIN B 12 PO) Take 1 tablet by mouth daily. Patient not taking: Reported on 07/13/2021    [provider]  diclofenac (VOLTAREN) 75 MG EC tablet Take 75 mg by mouth daily as needed for mild pain. Patient not taking: Reported on 07/13/2021    [provider]  dicyclomine (BENTYL) 20 MG tablet TAKE ONE TABLET BY MOUTH THREE TIMES DAILY BEFORE MEAL(S) 12/19/18   [name], MD  Digestive Enzyme CAPS Take 1 capsule by mouth 3 (three) times daily. Patient not taking: Reported on 07/13/2021    [provider]  irbesartan (AVAPRO) 300 MG tablet Take 1 tablet (300 mg total) by mouth daily. 06/24/20   [name], MD  Licorice, Glycyrrhiza glabra, (LICORICE PO) Take 1 capsule by mouth 3 (three) times daily. Will switch from capsules to chews depending. Will take 3 at the end of meal if taking the chews Patient not taking: Reported on 07/13/2021    [provider]  meclizine (ANTIVERT) 12.5 MG tablet TAKE 1 TABLET BY MOUTH THREE TIMES DAILY AS NEEDED FOR DIZZINESS 01/09/20   [name], MD  meloxicam (MOBIC) 15 MG tablet TAKE 1 TABLET BY MOUTH ONCE DAILY AS NEEDED FOR PAIN 03/25/20   [name], MD   methylPREDNISolone acetate (DEPO-MEDROL) 40 MG/ML injection Inject 80 mg into the muscle once. Patient not taking: Reported on 07/13/2021 05/30/20   [provider]  ondansetron (ZOFRAN) 4 MG tablet Take 1 tablet (4 mg total) by mouth every 8 (eight) hours as needed for nausea or vomiting. Patient not taking: Reported on 07/13/2021 12/19/18   [name], MD  OVER THE COUNTER MEDICATION Take 1 tablet by mouth 3 (three) times daily as needed (for pain). Curamin Extra Strength Patient not taking: Reported on 07/13/2021    [provider]  PERCOCET 10-325 MG tablet Take 1 tablet by mouth. Patient not taking: Reported on 07/13/2021 05/30/20   [provider]  Vitamin D, Ergocalciferol, (DRISDOL) 1.25 MG (50000 UNIT) CAPS capsule Take 1 capsule (50,000 Units total) by mouth every 7 (seven) days.  06/25/20   [name], MD    Physical Exam: Vitals:   10/19/21 2015 10/19/21 2338 10/20/21 0009  BP: (!) 141/91 (!) 180/110 (!) 186/98  Pulse: 87 93 87  Resp: 16 13 16   Temp: 99.2 F (37.3 C)    TempSrc: Oral    SpO2: 99% 96% 99%  Weight: 96.6 kg    Height: 4\' 11"  (1.499 m)      Constitutional: Awake alert and oriented x3, patient is in distress due to abdominal pain Skin: no rashes, no lesions, good skin turgor noted. Eyes: Pupils are equally reactive to light.  No evidence of scleral icterus or conjunctival pallor.  ENMT: Moist mucous membranes noted.  Posterior pharynx clear of any exudate or lesions.   Neck: normal, supple, no masses, no thyromegaly.  No evidence of jugular venous distension.   Respiratory: clear to auscultation bilaterally, no wheezing, no crackles. Normal respiratory effort. No accessory muscle use.  Cardiovascular: Regular rate and rhythm, no murmurs / rubs / gallops. No extremity edema. 2+ pedal pulses. No carotid bruits.  Chest:   Nontender without crepitus or deformity.   Back:   Nontender without crepitus or deformity. Abdomen: Abdomen is  distended with notable epigastric tenderness.  Abdomen is soft however.    No evidence of intra-abdominal masses.  Positive bowel sounds noted in all quadrants.   Musculoskeletal: No joint deformity upper and lower extremities. Good ROM, no contractures. Normal muscle tone.  Neurologic: CN 2-12 grossly intact. Sensation intact.  Patient moving all 4 extremities spontaneously.  Patient is following all commands.  Patient is responsive to verbal stimuli.   Psychiatric: Patient exhibits normal mood with appropriate affect.  Patient seems to possess insight as to their current situation.     Labs on Admission: I have personally reviewed following labs and imaging studies -   CBC: Recent Labs  Lab 10/19/21 2140  WBC 8.7  NEUTROABS 6.7  HGB 13.3  HCT 41.6  MCV 82.9  PLT 336   Basic Metabolic Panel: Recent Labs  Lab 10/19/21 2140  NA 137  K 4.3  CL 105  CO2 23  GLUCOSE 184*  BUN 11  CREATININE 0.63  CALCIUM 9.7   GFR: Estimated Creatinine Clearance: 72.5 mL/min (by C-G formula based on SCr of 0.63 mg/dL). Liver Function Tests: Recent Labs  Lab 10/19/21 2140  AST 23  ALT 18  ALKPHOS 85  BILITOT 1.1  PROT 8.6*  ALBUMIN 4.4   Recent Labs  Lab 10/19/21 2140  LIPASE 24   No results for input(s): AMMONIA in the last 168 hours. Coagulation Profile: No results for input(s): INR, PROTIME in the last 168 hours. Cardiac Enzymes: No results for input(s): CKTOTAL, CKMB, CKMBINDEX, TROPONINI in the last 168 hours. BNP (last 3 results) No results for input(s): PROBNP in the last 8760 hours. HbA1C: No results for input(s): HGBA1C in the last 72 hours. CBG: No results for input(s): GLUCAP in the last 168 hours. Lipid Profile: No results for input(s): CHOL, HDL, LDLCALC, TRIG, CHOLHDL, LDLDIRECT in the last 72 hours. Thyroid Function Tests: No results for input(s): TSH, T4TOTAL, FREET4, T3FREE, THYROIDAB in the last 72 hours. Anemia Panel: No results for input(s): VITAMINB12,  FOLATE, FERRITIN, TIBC, IRON, RETICCTPCT in the last 72 hours. Urine analysis:    Component Value Date/Time   COLORURINE YELLOW 10/19/2021 2353   APPEARANCEUR CLEAR 10/19/2021 2353   LABSPEC >1.046 (H) 10/19/2021 2353   PHURINE 7.0 10/19/2021 2353   GLUCOSEU NEGATIVE 10/19/2021 2353  GLUCOSEU NEGATIVE 06/28/2021 1126   HGBUR NEGATIVE 10/19/2021 2353   BILIRUBINUR NEGATIVE 10/19/2021 2353   KETONESUR 5 (A) 10/19/2021 2353   PROTEINUR NEGATIVE 10/19/2021 2353   UROBILINOGEN 0.2 06/28/2021 1126   NITRITE NEGATIVE 10/19/2021 2353   LEUKOCYTESUR NEGATIVE 10/19/2021 2353    Radiological Exams on Admission - Personally Reviewed: CT ABDOMEN PELVIS W CONTRAST  Result Date: 10/19/2021 CLINICAL DATA:  Evaluate for bowel obstruction. EXAM: CT ABDOMEN AND PELVIS WITH CONTRAST TECHNIQUE: Multidetector CT imaging of the abdomen and pelvis was performed using the standard protocol following bolus administration of intravenous contrast. CONTRAST:  80mL OMNIPAQUE IOHEXOL 350 MG/ML SOLN COMPARISON:  CT abdomen and pelvis 08/25/2018. FINDINGS: Lower chest: No acute abnormality. Hepatobiliary: No focal liver abnormality is seen. Status post cholecystectomy. No biliary dilatation. Pancreas: Unremarkable. No pancreatic ductal dilatation or surrounding inflammatory changes. Spleen: Normal in size without focal abnormality. Adrenals/Urinary Tract: Adrenal glands are unremarkable. Kidneys are normal, without renal calculi, focal lesion, or hydronephrosis. Bladder is unremarkable. Stomach/Bowel: There are fluid-filled mildly dilated central small bowel loops, many of which approximate the anterior abdominal wall. There are few tiny foci of extraluminal gas in the anterior abdomen adjacent to dilated small bowel loops image 2/49 and 50. There is no definitive wall thickening. Definitive transition point is not seen, but distal small bowel is decompressed in the pelvis. Colon and appendix are within normal limits. There is  a small hiatal hernia. The stomach is otherwise within normal limits. Vascular/Lymphatic: No significant vascular findings are present. No enlarged abdominal or pelvic lymph nodes. Reproductive: Uterus and bilateral adnexa are unremarkable. Other: There is no ascites. There is scarring in the anterior abdominal wall. Musculoskeletal: No acute or significant osseous findings. IMPRESSION: Dilated central small bowel loops approximating the anterior abdominal wall worrisome for small bowel obstruction, likely secondary to adhesions. There is a small amount of extraluminal air suspicious for micro perforation. Electronically Signed   By: Darliss Cheney M.D.   On: 10/19/2021 23:08   DG Abd Portable 1 View  Result Date: 10/20/2021 CLINICAL DATA:  NG tube placement. EXAM: PORTABLE ABDOMEN - 1 VIEW COMPARISON:  10/19/2021. FINDINGS: The bowel gas pattern is normal. An enteric tube terminates over the stomach. Excreted contrast is noted in the renal collecting systems bilaterally and urinary bladder. IMPRESSION: An enteric tube terminates over the stomach. Electronically Signed   By: Thornell Sartorius M.D.   On: 10/20/2021 01:42    EKG: Personally reviewed.  Rhythm is normal sinus rhythm with heart rate of 74 bpm.  No dynamic ST segment changes appreciated.  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  Did not exhibit any evidence of fever, leukocytosis and has a relatively soft abdomen  After ER provider discussed case with Dr. Donell Beers with general surgery decision was made to place patient on empiric antibiotics for now Dr. Donell Beers with general surgery to consult on patient in the morning, their assistance is appreciated. Proceeding with NG tube placement which will be set to low intermittent  suction Hydrating patient with intravenous isotonic fluids Serial abdominal exams As needed antiemetics and opiate-based analgesics Serial abdominal x-rays   Essential hypertension- (present on admission) Holding all oral antihypertensives Instead, placing patient on scheduled Vasotec every 6 hours We will additionally provide patient with as needed intravenous hydralazine for markedly elevated blood pressure  GERD without esophagitis- (present on admission)  Protonix 40 mg IV every 12 hours  Hyperglycemia- (present on admission) Possibly stress-induced, obtaining hemoglobin A1c      Code Status:  Full code  code status decision has been confirmed with: Patient Family Communication: Husband is at bedside who has been updated on plan of care  Status is: Inpatient  Remains inpatient appropriate because:   Small bowel obstruction with possible microperforation requiring close clinical monitoring, serial abdominal examinations, intravenous antibiotics, intravenous fluids, NGT placement cephalointermittent suction and expert consultation with surgery.        Marinda ElkGeorge J Rayetta Veith MD [facility] Pager [phone]- [phone]  If 7PM-7AM, please contact night-coverage [email or web address] Use universal [place] password for that web site. If you do not have the password, please call the hospital operator.  10/20/2021, 1:46 AM

## 2021-10-20 NOTE — Assessment & Plan Note (Signed)
·    Protonix 40 mg IV every 12 hours

## 2021-10-20 NOTE — Consult Note (Addendum)
[facility] Surgery Consult Note  Bianca Campbell [birth year]  [id number].    Requesting MD: Grandville Silos, MD  Chief Complaint/Reason for Consult: SBO  HPI:  Bianca Campbell is a 66 y/o F with a PMH multiple abdominal surgeries (below), HTN, GERD, and gout who presented to [facility] with a cc acute onset abdominal pain. Pain started 10/19/21 after a breakfast smoothie. Described as sharp, cramping, intermittent, non-radiating. Similar to pain shes had with SBO in the past. Denies alleviating factors. Associated with nausea and vomiting. Last BM and flatus 10/19/21 around 1600.   Denies use of blood thinners. Denies tobacco, alcohol, or drug use.   SHx:  Nov 2022 - repair of recurrent ventral hernia with prolene mesh (unclear if intraperitoneal...seprafilm was used)- Dr. Bubba Camp  March 2004 - repair of recurrent ventral hernia with incarcerated small bowel- seprafilm used, polypropylene mesh patch- Dr. Bubba Camp Nov 2005 - repair of recurrent, incarcerated ventral hernia with onlay Proceed mesh -Dr. Dalbert Batman Jan 2011 - lap converted to open repair of recurrent incarcerated ventral hernia with physio mesh, 75 minutes of adhesiolysis, Dr Dalbert Batman. Dr. Helane Rima performed BSO at that time.   ROS: Review of Systems  All other systems reviewed and are negative.  Family History  Problem Relation Age of Onset   Arthritis Mother        RA   Diabetes Mother    Seizures Mother    Colon polyps Father 58   Healthy Sister    Healthy Brother    Healthy Son    Healthy Daughter    Asthma Daughter    Colon cancer Neg Hx    Esophageal cancer Neg Hx    Rectal cancer Neg Hx    Stomach cancer Neg Hx     Past Medical History:  Diagnosis Date   ALLERGIC RHINITIS 09/11/2007   Anemia, unspecified 08/31/2012   Arthritis    "knees" (11/23/2016)   BACK PAIN    EXTERNAL OTITIS 09/11/2007   GERD 06/06/2007   GOUT 11/14/2007   High cholesterol    HYPERTENSION 06/06/2007   INTERMITTENT VERTIGO 09/11/2007   Irritable bowel  syndrome 06/06/2007   LOW BACK PAIN 06/06/2007   OSTEOARTHRITIS 06/06/2007   PONV (postoperative nausea and vomiting)    Primary localized osteoarthritis of right knee    SHOULDER PAIN, RIGHT 12/30/2008    Past Surgical History:  Procedure Laterality Date   90 HOUR [place] STUDY N/A 05/27/2019   Procedure: 24 HOUR [place] STUDY;  Surgeon: Jerene Bears, MD;  Location: [facility];  Service: Gastroenterology;  Laterality: N/A;   [place]     pt stated 2015 or 2016 unknown md   ESOPHAGEAL MANOMETRY N/A 05/27/2019   Procedure: ESOPHAGEAL MANOMETRY (EM) WITH IMPEDENCE STUDY;  Surgeon: Jerene Bears, MD;  Location: [facility];  Service: Gastroenterology;  Laterality: N/A;   EXPLORATORY LAPAROTOMY     bowel obstruction   INGUINAL HERNIA REPAIR Bilateral    numerous hernia repairs 1996 to 2011   JOINT REPLACEMENT     KNEE ARTHROSCOPY Bilateral 2013 - 11/11/2015   LAPAROSCOPIC CHOLECYSTECTOMY  1991   s/p incarcerated hernia and ovary cyst Jan 2011  2011   TOTAL KNEE ARTHROPLASTY Right 11/21/2016   Procedure: RIGHT TOTAL KNEE ARTHROPLASTY;  Surgeon: Elsie Saas, MD;  Location: [employer];  Service: Orthopedics;  Laterality: Right;   UMBILICAL HERNIA REPAIR     numerous hernia repairs 1996 to 2011  Social History:  reports that she has never smoked. She has never used smokeless tobacco. She reports that she does not drink alcohol and does not use drugs.  Allergies:  Allergies  Allergen Reactions   Latex Itching    (Not in a hospital admission)   Blood pressure (!) 143/81, pulse 80, temperature 99.2 F (37.3 C), temperature source Oral, resp. rate 13, height 4\' 11"  (1.499 m), weight 96.6 kg, SpO2 99 %. Physical Exam: Constitutional: NAD; conversant; no deformities Eyes: Moist conjunctiva; no lid lag; anicteric; PERRL Neck: Trachea midline; no thyromegaly Lungs: Normal respiratory effort; no tactile fremitus CV: RRR; no  palpable thrills; no pitting edema GI: Abd soft, there is fullness and mild tenderness to palpation of periumbilical region without guarding or peritonitis.; no palpable hepatosplenomegaly; previous laparotomy scar noted MSK: Normal gait; no clubbing/cyanosis Psychiatric: Appropriate affect; alert and oriented x3 Lymphatic: No palpable cervical or axillary lymphadenopathy  Results for orders placed or performed during the hospital encounter of 10/19/21 (from the past 48 hour(s))  CBC with Differential     Status: None   Collection Time: 10/19/21  9:40 PM  Result Value Ref Range   WBC 8.7 4.0 - 10.5 K/uL   RBC 5.02 3.87 - 5.11 MIL/uL   Hemoglobin 13.3 12.0 - 15.0 g/dL   HCT 41.6 36.0 - 46.0 %   MCV 82.9 80.0 - 100.0 fL   MCH 26.5 26.0 - 34.0 pg   MCHC 32.0 30.0 - 36.0 g/dL   RDW 13.6 11.5 - 15.5 %   Platelets 336 150 - 400 K/uL   nRBC 0.0 0.0 - 0.2 %   Neutrophils Relative % 77 %   Neutro Abs 6.7 1.7 - 7.7 K/uL   Lymphocytes Relative 19 %   Lymphs Abs 1.7 0.7 - 4.0 K/uL   Monocytes Relative 3 %   Monocytes Absolute 0.2 0.1 - 1.0 K/uL   Eosinophils Relative 1 %   Eosinophils Absolute 0.0 0.0 - 0.5 K/uL   Basophils Relative 0 %   Basophils Absolute 0.0 0.0 - 0.1 K/uL   Immature Granulocytes 0 %   Abs Immature Granulocytes 0.03 0.00 - 0.07 K/uL    Comment: Performed at [facility], [place] [address], [place], [place] [address]  Comprehensive metabolic panel     Status: Abnormal   Collection Time: 10/19/21  9:40 PM  Result Value Ref Range   Sodium 137 135 - 145 mmol/L   Potassium 4.3 3.5 - 5.1 mmol/L   Chloride 105 98 - 111 mmol/L   CO2 23 22 - 32 mmol/L   Glucose, Bld 184 (H) 70 - 99 mg/dL    Comment: Glucose reference range applies only to samples taken after fasting for at least 8 hours.   BUN 11 8 - 23 mg/dL   Creatinine, Ser 0.63 0.44 - 1.00 mg/dL   Calcium 9.7 8.9 - 10.3 mg/dL   Total Protein 8.6 (H) 6.5 - 8.1 g/dL   Albumin 4.4 3.5 - 5.0 g/dL   AST 23  15 - 41 U/L   ALT 18 0 - 44 U/L   Alkaline Phosphatase 85 38 - 126 U/L   Total Bilirubin 1.1 0.3 - 1.2 mg/dL   GFR, Estimated >60 >60 mL/min    Comment: (NOTE) Calculated using the CKD-EPI Creatinine Equation (2021)    Anion gap 9 5 - 15    Comment: Performed at [facility], [place] [address], [place], [place] [address]  Lipase, blood  Status: None   Collection Time: 10/19/21  9:40 PM  Result Value Ref Range   Lipase 24 11 - 51 U/L    Comment: Performed at [facility], [place] [address] [place], [place] [address]  Urinalysis, Routine w reflex microscopic Urine, Clean Catch     Status: Abnormal   Collection Time: 10/19/21 11:53 PM  Result Value Ref Range   Color, Urine YELLOW YELLOW   APPearance CLEAR CLEAR   Specific Gravity, Urine >1.046 (H) 1.005 - 1.030   pH 7.0 5.0 - 8.0   Glucose, UA NEGATIVE NEGATIVE mg/dL   Hgb urine dipstick NEGATIVE NEGATIVE   Bilirubin Urine NEGATIVE NEGATIVE   Ketones, ur 5 (A) NEGATIVE mg/dL   Protein, ur NEGATIVE NEGATIVE mg/dL   Nitrite NEGATIVE NEGATIVE   Leukocytes,Ua NEGATIVE NEGATIVE    Comment: Performed at [place] [address] [place], [place] [address]  Resp Panel by RT-PCR (Flu A&B, Covid) Nasopharyngeal Swab     Status: None   Collection Time: 10/20/21  2:30 AM   Specimen: Nasopharyngeal Swab; Nasopharyngeal(NP) swabs in vial transport medium  Result Value Ref Range   SARS Coronavirus 2 by RT PCR NEGATIVE NEGATIVE    Comment: (NOTE) SARS-CoV-2 target nucleic acids are NOT DETECTED.  The SARS-CoV-2 RNA is generally detectable in upper respiratory specimens during the acute phase of infection. The lowest concentration of SARS-CoV-2 viral copies this assay can detect is 138 copies/mL. A negative result does not preclude SARS-Cov-2 infection and should not be used as the sole basis for treatment or other patient management decisions. A negative result may occur with   improper specimen collection/handling, submission of specimen other than nasopharyngeal swab, presence of viral mutation(s) within the areas targeted by this assay, and inadequate number of viral copies(<138 copies/mL). A negative result must be combined with clinical observations, patient history, and epidemiological information. The expected result is Negative.  Fact Sheet for Patients:  EntrepreneurPulse.com.au  Fact Sheet for Healthcare Providers:  IncredibleEmployment.be  This test is no t yet approved or cleared by the [place] FDA and  has been authorized for detection and/or diagnosis of SARS-CoV-2 by FDA under an Emergency Use Authorization (EUA). This EUA will remain  in effect (meaning this test can be used) for the duration of the COVID-19 declaration under Section 564(b)(1) of the Act, 21 U.S.C.section 360bbb-3(b)(1), unless the authorization is terminated  or revoked sooner.       Influenza A by PCR NEGATIVE NEGATIVE   Influenza B by PCR NEGATIVE NEGATIVE    Comment: (NOTE) The Xpert Xpress SARS-CoV-2/FLU/RSV plus assay is intended as an aid in the diagnosis of influenza from Nasopharyngeal swab specimens and should not be used as a sole basis for treatment. Nasal washings and aspirates are unacceptable for Xpert Xpress SARS-CoV-2/FLU/RSV testing.  Fact Sheet for Patients: EntrepreneurPulse.com.au  Fact Sheet for Healthcare Providers: IncredibleEmployment.be  This test is not yet approved or cleared by the [place] FDA and has been authorized for detection and/or diagnosis of SARS-CoV-2 by FDA under an Emergency Use Authorization (EUA). This EUA will remain in effect (meaning this test can be used) for the duration of the COVID-19 declaration under Section 564(b)(1) of the Act, 21 U.S.C. section 360bbb-3(b)(1), unless the authorization is terminated or revoked.  Performed at  [facility], [place] [address], [place], [place] [address]   Comprehensive metabolic panel     Status: Abnormal   Collection Time: 10/20/21  5:50 AM  Result  Value Ref Range   Sodium 138 135 - 145 mmol/L   Potassium 3.5 3.5 - 5.1 mmol/L    Comment: DELTA CHECK NOTED   Chloride 104 98 - 111 mmol/L   CO2 26 22 - 32 mmol/L   Glucose, Bld 143 (H) 70 - 99 mg/dL    Comment: Glucose reference range applies only to samples taken after fasting for at least 8 hours.   BUN 8 8 - 23 mg/dL   Creatinine, Ser 0.60 0.44 - 1.00 mg/dL   Calcium 8.9 8.9 - 10.3 mg/dL   Total Protein 7.8 6.5 - 8.1 g/dL   Albumin 3.7 3.5 - 5.0 g/dL   AST 18 15 - 41 U/L   ALT 16 0 - 44 U/L   Alkaline Phosphatase 72 38 - 126 U/L   Total Bilirubin 1.0 0.3 - 1.2 mg/dL   GFR, Estimated >60 >60 mL/min    Comment: (NOTE) Calculated using the CKD-EPI Creatinine Equation (2021)    Anion gap 8 5 - 15    Comment: Performed at [facility], [place] [address] [place], [place] [address]  Magnesium     Status: None   Collection Time: 10/20/21  5:50 AM  Result Value Ref Range   Magnesium 2.0 1.7 - 2.4 mg/dL    Comment: Performed at [facility], [place] [address] [place], [place] [address]  CBC WITH DIFFERENTIAL     Status: Abnormal   Collection Time: 10/20/21  5:50 AM  Result Value Ref Range   WBC 9.1 4.0 - 10.5 K/uL   RBC 4.52 3.87 - 5.11 MIL/uL   Hemoglobin 11.9 (L) 12.0 - 15.0 g/dL   HCT 37.6 36.0 - 46.0 %   MCV 83.2 80.0 - 100.0 fL   MCH 26.3 26.0 - 34.0 pg   MCHC 31.6 30.0 - 36.0 g/dL   RDW 13.7 11.5 - 15.5 %   Platelets 302 150 - 400 K/uL   nRBC 0.0 0.0 - 0.2 %   Neutrophils Relative % 82 %   Neutro Abs 7.5 1.7 - 7.7 K/uL   Lymphocytes Relative 12 %   Lymphs Abs 1.1 0.7 - 4.0 K/uL   Monocytes Relative 5 %   Monocytes Absolute 0.4 0.1 - 1.0 K/uL   Eosinophils Relative 1 %   Eosinophils Absolute 0.1 0.0 - 0.5 K/uL   Basophils Relative 0 %   Basophils Absolute  0.0 0.0 - 0.1 K/uL   Immature Granulocytes 0 %   Abs Immature Granulocytes 0.03 0.00 - 0.07 K/uL    Comment: Performed at [facility], [place] [address] [place], [place] [address]   CT ABDOMEN PELVIS W CONTRAST  Result Date: 10/19/2021 CLINICAL DATA:  Evaluate for bowel obstruction. EXAM: CT ABDOMEN AND PELVIS WITH CONTRAST TECHNIQUE: Multidetector CT imaging of the abdomen and pelvis was performed using the standard protocol following bolus administration of intravenous contrast. CONTRAST:  10mL OMNIPAQUE IOHEXOL 350 MG/ML SOLN COMPARISON:  CT abdomen and pelvis 08/25/2018. FINDINGS: Lower chest: No acute abnormality. Hepatobiliary: No focal liver abnormality is seen. Status post cholecystectomy. No biliary dilatation. Pancreas: Unremarkable. No pancreatic ductal dilatation or surrounding inflammatory changes. Spleen: Normal in size without focal abnormality. Adrenals/Urinary Tract: Adrenal glands are unremarkable. Kidneys are normal, without renal calculi, focal lesion, or hydronephrosis. Bladder is unremarkable. Stomach/Bowel: There are fluid-filled mildly dilated central small bowel loops, many of which approximate the anterior abdominal wall. There are few tiny foci of extraluminal gas in the anterior abdomen adjacent to  dilated small bowel loops image 2/49 and 50. There is no definitive wall thickening. Definitive transition point is not seen, but distal small bowel is decompressed in the pelvis. Colon and appendix are within normal limits. There is a small hiatal hernia. The stomach is otherwise within normal limits. Vascular/Lymphatic: No significant vascular findings are present. No enlarged abdominal or pelvic lymph nodes. Reproductive: Uterus and bilateral adnexa are unremarkable. Other: There is no ascites. There is scarring in the anterior abdominal wall. Musculoskeletal: No acute or significant osseous findings. IMPRESSION: Dilated central small bowel loops approximating the  anterior abdominal wall worrisome for small bowel obstruction, likely secondary to adhesions. There is a small amount of extraluminal air suspicious for micro perforation. Electronically Signed   By: Ronney Asters M.D.   On: 10/19/2021 23:08   DG Abd Portable 1 View  Result Date: 10/20/2021 CLINICAL DATA:  NG tube placement. EXAM: PORTABLE ABDOMEN - 1 VIEW COMPARISON:  10/20/2021. FINDINGS: The bowel gas pattern is normal. An enteric tube terminates over the stomach. No abnormal calcification is seen. Surgical clips are present in the right upper quadrant. Excreted contrast is noted in the renal collecting systems. The cardiac silhouette is enlarged. IMPRESSION: The enteric tube terminates in the stomach. Electronically Signed   By: Brett Fairy M.D.   On: 10/20/2021 04:23   DG Abd Portable 1 View  Result Date: 10/20/2021 CLINICAL DATA:  NG tube placement. EXAM: PORTABLE ABDOMEN - 1 VIEW COMPARISON:  10/19/2021. FINDINGS: The bowel gas pattern is normal. An enteric tube terminates over the stomach. Excreted contrast is noted in the renal collecting systems bilaterally and urinary bladder. IMPRESSION: An enteric tube terminates over the stomach. Electronically Signed   By: Brett Fairy M.D.   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 Small amount of extraluminal air suspicious for micro perforation. Clinically the patient does not have worrisome signs of small bowel perforation (peritonitis, hemodynamic instability, acidosis). Continue to monitor. - No emergent surgical needs. Start SBO protocol with gastrografin.  - hopefully patient will improve with non-operative measures. If she were to need surgery it would be difficult and higher risk for bowel injury or complication given her history of multiple  abdominal surgeries including placement of mesh.  Below per [facility] --  GERD HTN Hyperglycemia    Jill Alexanders, [facility] Please see Amion for pager number during day hours 7:00am-4:30pm 10/20/2021, 7:21 AM

## 2021-10-21 ENCOUNTER — Inpatient Hospital Stay: Payer: Self-pay

## 2021-10-21 DIAGNOSIS — I10 Essential (primary) hypertension: Secondary | ICD-10-CM | POA: Diagnosis not present

## 2021-10-21 DIAGNOSIS — K219 Gastro-esophageal reflux disease without esophagitis: Secondary | ICD-10-CM | POA: Diagnosis not present

## 2021-10-21 DIAGNOSIS — K56609 Unspecified intestinal obstruction, unspecified as to partial versus complete obstruction: Secondary | ICD-10-CM | POA: Diagnosis not present

## 2021-10-21 DIAGNOSIS — K565 Intestinal adhesions [bands], unspecified as to partial versus complete obstruction: Secondary | ICD-10-CM | POA: Diagnosis not present

## 2021-10-21 LAB — CBC WITH DIFFERENTIAL/PLATELET
Abs Immature Granulocytes: 0.04 10*3/uL (ref 0.00–0.07)
Basophils Absolute: 0 10*3/uL (ref 0.0–0.1)
Basophils Relative: 0 %
Eosinophils Absolute: 0.3 10*3/uL (ref 0.0–0.5)
Eosinophils Relative: 5 %
HCT: 34.8 % — ABNORMAL LOW (ref 36.0–46.0)
Hemoglobin: 11.1 g/dL — ABNORMAL LOW (ref 12.0–15.0)
Immature Granulocytes: 1 %
Lymphocytes Relative: 36 %
Lymphs Abs: 2 10*3/uL (ref 0.7–4.0)
MCH: 26.4 pg (ref 26.0–34.0)
MCHC: 31.9 g/dL (ref 30.0–36.0)
MCV: 82.9 fL (ref 80.0–100.0)
Monocytes Absolute: 0.4 10*3/uL (ref 0.1–1.0)
Monocytes Relative: 7 %
Neutro Abs: 2.9 10*3/uL (ref 1.7–7.7)
Neutrophils Relative %: 51 %
Platelets: 259 10*3/uL (ref 150–400)
RBC: 4.2 MIL/uL (ref 3.87–5.11)
RDW: 13.9 % (ref 11.5–15.5)
WBC: 5.6 10*3/uL (ref 4.0–10.5)
nRBC: 0 % (ref 0.0–0.2)

## 2021-10-21 LAB — BASIC METABOLIC PANEL
Anion gap: 7 (ref 5–15)
BUN: 6 mg/dL — ABNORMAL LOW (ref 8–23)
CO2: 24 mmol/L (ref 22–32)
Calcium: 8.4 mg/dL — ABNORMAL LOW (ref 8.9–10.3)
Chloride: 107 mmol/L (ref 98–111)
Creatinine, Ser: 0.53 mg/dL (ref 0.44–1.00)
GFR, Estimated: >60 mL/min (ref >60)
Glucose, Bld: 109 mg/dL — ABNORMAL HIGH (ref 70–99)
Potassium: 4 mmol/L (ref 3.5–5.1)
Sodium: 138 mmol/L (ref 135–145)

## 2021-10-21 LAB — MAGNESIUM: Magnesium: 1.8 mg/dL (ref 1.7–2.4)

## 2021-10-21 MED ORDER — PHENOL 1.4 % MT LIQD
1.0000 | OROMUCOSAL | Status: DC | PRN
  Administered 2021-10-21: 1 via OROMUCOSAL
  Filled 2021-10-21: qty 177

## 2021-10-21 NOTE — Progress Notes (Signed)
At bedside to place PICC after explaining procedure and going over risks and benefits patient has concerns about having PICC placed. She request to wait so she could talk with her husband and to see how she is doing tomorrow. Placed a PIV with ultrasound on right anterior forearm with good blood return which gives IV access for meds she needs tonight. Primary RN aware and we will reach out to see if PICC is needed tomorrow.

## 2021-10-21 NOTE — Progress Notes (Signed)
PROGRESS NOTE    Bianca Campbell  UVO:[id number] DOB: 1956/03/04 DOA: 10/19/2021 PCP: Bianca Campbell    Chief Complaint  Patient presents with   Abdominal Pain   Emesis    Brief Narrative:  Patient 66 year old female history of multiple abdominal surgeries in the past, history of frequent hospitalizations for SBO, hypertension, GERD, gout presenting to the ED with complaints of abdominal pain nausea vomiting.  CT imaging done concerning for small bowel obstruction likely secondary to adhesions.  There is also some concern for microperforations.  Patient placed empirically on IV antibiotics.  NG tube placed.  General surgery consulted.   Assessment & Plan:   Principal Problem:   Small bowel obstruction due to adhesions [facility](HCC) Active Problems:   Essential hypertension   GERD without esophagitis   Hyperglycemia  #1 small bowel obstruction likely secondary to adhesions, POA -Patient with history of multiple abdominal surgeries, history of multiple small bowel obstructions.  Secondary to adhesions. -Patient presented abdominal pain, nausea vomiting, CT scan concerning for small bowel obstruction with associated microperforation. -Patient afebrile, normal white count, no rebound or guarding on examination -NG tube placed with low intermittent suction with bilious material noted. -Continue IV Zosyn. -Replete electrolytes to keep potassium approximately 4, magnesium approximately 2. -General surgery consulted and small bowel protocol underway. -Patient with some clinical improvement, no nausea or vomiting.  States abdominal pain has improved.  No flatus.  No bowel movement. -If no improvement with conservative treatment would likely need surgical exploration. -IV fluids, supportive care, bowel rest. -Per general surgery.  2.  Hypertension -Currently bowel rest and as such holding oral antihypertensive medications. -Blood pressure improved on IV Vasotec.  3.  GERD without  esophagitis -Continue IV PPI.    4.  Hyperglycemia -Likely stress-induced. -Hemoglobin A1c 6.7. -We will need outpatient follow-up.   DVT prophylaxis: Lovenox Code Status: Full Family Communication: Updated patient.  No family at bedside. Disposition:   Status is: Inpatient  Remains inpatient appropriate because: Severity of illness       Consultants:  General surgery: Dr. Freida BusmanAllen 10/20/2021  Procedures:  CT abdomen and pelvis 10/19/2021 Small bowel protocol   Antimicrobials: IV Zosyn 10/20/2021>>>>>   Subjective: Sitting up in chair.  NG tube with bilious drainage noted.  No chest pain.  No shortness of breath.  Abdominal pain in the upper abdominal region improved feels pain is now from vomiting.  No further nausea or vomiting.  No flatus.  No bowel movement.    Objective: Vitals:   10/20/21 1751 10/20/21 2125 10/21/21 0307 10/21/21 0626  BP: (!) 158/92 135/72 (!) 149/78 (!) 170/94  Pulse: 81 81 75 81  Resp: 15 12 12 14   Temp: 99.1 F (37.3 C) 98.3 F (36.8 C) 97.8 F (36.6 C) 98.4 F (36.9 C)  TempSrc: Oral Oral Oral Oral  SpO2: 99% 94% 96% 100%  Weight: 96.5 kg     Height: 4' 11.5" (1.511 m)       Intake/Output Summary (Last 24 hours) at 10/21/2021 1130 Last data filed at 10/21/2021 0700 Gross per 24 hour  Intake 1014.73 ml  Output 150 ml  Net 864.73 ml   Filed Weights   10/19/21 2015 10/20/21 1751  Weight: 96.6 kg 96.5 kg    Examination:  General exam: : NAD.  NG tube in place. Respiratory system: CTA B, no wheezes, no crackles, no rhonchi.  Normal respiratory effort.  Speaking in full sentences.  Cardiovascular system: Regular rate and rhythm no murmurs rubs  or gallops.  No JVD.  No lower extremity edema.  Gastrointestinal system: Abdomen soft, decreased tenderness to palpation in epigastrium and right upper quadrant.  Positive bowel sounds.  No rebound.  No guarding. Central nervous system: Alert and oriented. No focal neurological  deficits. Extremities: Symmetric 5 x 5 power. Skin: No rashes, lesions or ulcers Psychiatry: Judgement and insight appear normal. Mood & affect appropriate.   Data Reviewed: I have personally reviewed following labs and imaging studies  CBC: Recent Labs  Lab 10/19/21 2140 10/20/21 0550 10/21/21 0446  WBC 8.7 9.1 5.6  NEUTROABS 6.7 7.5 2.9  HGB 13.3 11.9* 11.1*  HCT 41.6 37.6 34.8*  MCV 82.9 83.2 82.9  PLT 336 302 259     Basic Metabolic Panel: Recent Labs  Lab 10/19/21 2140 10/20/21 0550 10/21/21 0446  NA 137 138 138  K 4.3 3.5 4.0  CL 105 104 107  CO2 23 26 24   GLUCOSE 184* 143* 109*  BUN 11 8 6*  CREATININE 0.63 0.60 0.53  CALCIUM 9.7 8.9 8.4*  MG  --  2.0 1.8     GFR: Estimated Creatinine Clearance: 73.1 mL/min (by C-G formula based on SCr of 0.53 mg/dL).  Liver Function Tests: Recent Labs  Lab 10/19/21 2140 10/20/21 0550  AST 23 18  ALT 18 16  ALKPHOS 85 72  BILITOT 1.1 1.0  PROT 8.6* 7.8  ALBUMIN 4.4 3.7     CBG: No results for input(s): GLUCAP in the last 168 hours.   Recent Results (from the past 240 hour(s))  Resp Panel by RT-PCR (Flu A&B, Covid) Nasopharyngeal Swab     Status: None   Collection Time: 10/20/21  2:30 AM   Specimen: Nasopharyngeal Swab; Nasopharyngeal(NP) swabs in vial transport medium  Result Value Ref Range Status   SARS Coronavirus 2 by RT PCR NEGATIVE NEGATIVE Final    Comment: (NOTE) SARS-CoV-2 target nucleic acids are NOT DETECTED.  The SARS-CoV-2 RNA is generally detectable in upper respiratory specimens during the acute phase of infection. The lowest concentration of SARS-CoV-2 viral copies this assay can detect is 138 copies/mL. A negative result does not preclude SARS-Cov-2 infection and should not be used as the sole basis for treatment or other patient management decisions. A negative result may occur with  improper specimen collection/handling, submission of specimen other than nasopharyngeal swab,  presence of viral mutation(s) within the areas targeted by this assay, and inadequate number of viral copies(<138 copies/mL). A negative result must be combined with clinical observations, patient history, and epidemiological information. The expected result is Negative.  Fact Sheet for Patients:  12/18/21  Fact Sheet for Healthcare Providers:  BloggerCourse.com  This test is no t yet approved or cleared by the [place] FDA and  has been authorized for detection and/or diagnosis of SARS-CoV-2 by FDA under an Emergency Use Authorization (EUA). This EUA will remain  in effect (meaning this test can be used) for the duration of the COVID-19 declaration under Section 564(b)(1) of the Act, 21 U.S.C.section 360bbb-3(b)(1), unless the authorization is terminated  or revoked sooner.       Influenza A by PCR NEGATIVE NEGATIVE Final   Influenza B by PCR NEGATIVE NEGATIVE Final    Comment: (NOTE) The Xpert Xpress SARS-CoV-2/FLU/RSV plus assay is intended as an aid in the diagnosis of influenza from Nasopharyngeal swab specimens and should not be used as a sole basis for treatment. Nasal washings and aspirates are unacceptable for Xpert Xpress SARS-CoV-2/FLU/RSV testing.  Fact Sheet for  Patients: BloggerCourse.com  Fact Sheet for Healthcare Providers: SeriousBroker.it  This test is not yet approved or cleared by the [place] FDA and has been authorized for detection and/or diagnosis of SARS-CoV-2 by FDA under an Emergency Use Authorization (EUA). This EUA will remain in effect (meaning this test can be used) for the duration of the COVID-19 declaration under Section 564(b)(1) of the Act, 21 U.S.C. section 360bbb-3(b)(1), unless the authorization is terminated or revoked.  Performed at [facility], [address] [place], [place] [address]            Radiology Studies: CT ABDOMEN PELVIS W CONTRAST  Result Date: 10/19/2021 CLINICAL DATA:  Evaluate for bowel obstruction. EXAM: CT ABDOMEN AND PELVIS WITH CONTRAST TECHNIQUE: Multidetector CT imaging of the abdomen and pelvis was performed using the standard protocol following bolus administration of intravenous contrast. CONTRAST:  19mL OMNIPAQUE IOHEXOL 350 MG/ML SOLN COMPARISON:  CT abdomen and pelvis 08/25/2018. FINDINGS: Lower chest: No acute abnormality. Hepatobiliary: No focal liver abnormality is seen. Status post cholecystectomy. No biliary dilatation. Pancreas: Unremarkable. No pancreatic ductal dilatation or surrounding inflammatory changes. Spleen: Normal in size without focal abnormality. Adrenals/Urinary Tract: Adrenal glands are unremarkable. Kidneys are normal, without renal calculi, focal lesion, or hydronephrosis. Bladder is unremarkable. Stomach/Bowel: There are fluid-filled mildly dilated central small bowel loops, many of which approximate the anterior abdominal wall. There are few tiny foci of extraluminal gas in the anterior abdomen adjacent to dilated small bowel loops image 2/49 and 50. There is no definitive wall thickening. Definitive transition point is not seen, but distal small bowel is decompressed in the pelvis. Colon and appendix are within normal limits. There is a small hiatal hernia. The stomach is otherwise within normal limits. Vascular/Lymphatic: No significant vascular findings are present. No enlarged abdominal or pelvic lymph nodes. Reproductive: Uterus and bilateral adnexa are unremarkable. Other: There is no ascites. There is scarring in the anterior abdominal wall. Musculoskeletal: No acute or significant osseous findings. IMPRESSION: Dilated central small bowel loops approximating the anterior abdominal wall worrisome for small bowel obstruction, likely secondary to adhesions. There is a small amount of extraluminal air suspicious for micro perforation.  Electronically Signed   By: Darliss Cheney M.D.   On: 10/19/2021 23:08   DG Abd 2 Views  Result Date: 10/20/2021 CLINICAL DATA:  Small bowel obstruction. EXAM: ABDOMEN - 2 VIEW COMPARISON:  Earlier same day FINDINGS: No gaseous small bowel dilatation to suggest overt obstruction. Gas-filled small bowel loop in the medial right abdomen shows 2.6 cm diameter. Colon is largely gasless is well. Contrast material in the urinary bladder is compatible with CT scan yesterday. NG tube tip is at or near the pylorus. IMPRESSION: No gas-filled dilated small bowel loops to suggest bowel obstruction. NG tube tip is positioned in the pyloric region of the stomach. Electronically Signed   By: Kennith Center M.D.   On: 10/20/2021 07:49   DG Abd Portable 1V-Small Bowel Obstruction Protocol-initial, 8 hr delay  Result Date: 10/20/2021 CLINICAL DATA:  Bowel obstruction EXAM: PORTABLE ABDOMEN - 1 VIEW COMPARISON:  10/20/2021 FINDINGS: Frontal view of the abdomen and pelvis excludes the pubic symphysis by collimation. Oral contrast has progressed throughout the colon by the time of the exam. No evidence of bowel obstruction. No masses or abnormal calcifications. Enteric catheter tip projects over the gastric antrum. IMPRESSION: 1. No evidence of bowel obstruction. Oral contrast throughout the colon. 2. Enteric catheter tip projects over gastric antrum. Electronically Signed   By:  Sharlet SalinaMichael  Brown M.D.   On: 10/20/2021 17:02   DG Abd Portable 1 View  Result Date: 10/20/2021 CLINICAL DATA:  NG tube placement. EXAM: PORTABLE ABDOMEN - 1 VIEW COMPARISON:  10/20/2021. FINDINGS: The bowel gas pattern is normal. An enteric tube terminates over the stomach. No abnormal calcification is seen. Surgical clips are present in the right upper quadrant. Excreted contrast is noted in the renal collecting systems. The cardiac silhouette is enlarged. IMPRESSION: The enteric tube terminates in the stomach. Electronically Signed   By: Thornell SartoriusLaura  Taylor  M.D.   On: 10/20/2021 04:23   DG Abd Portable 1 View  Result Date: 10/20/2021 CLINICAL DATA:  NG tube placement. EXAM: PORTABLE ABDOMEN - 1 VIEW COMPARISON:  10/19/2021. FINDINGS: The bowel gas pattern is normal. An enteric tube terminates over the stomach. Excreted contrast is noted in the renal collecting systems bilaterally and urinary bladder. IMPRESSION: An enteric tube terminates over the stomach. Electronically Signed   By: Thornell SartoriusLaura  Taylor M.D.   On: 10/20/2021 01:42        Scheduled Meds:  enalaprilat  1.25 mg Intravenous Q6H   enoxaparin (LOVENOX) injection  40 mg Subcutaneous Q24H   pantoprazole (PROTONIX) IV  40 mg Intravenous QHS   Continuous Infusions:  lactated ringers with kcl 125 mL/hr at 10/21/21 0808   piperacillin-tazobactam (ZOSYN)  IV 3.375 g (10/21/21 0620)     LOS: 1 day    Time spent: 35 minutes    Ramiro Harvestaniel Yannely Kintzel, [facility]   To contact the attending provider between 7A-7P or the covering provider during after hours 7P-7A, please log into the web site [email or web address] and access using universal [place] password for that web site. If you do not have the password, please call the hospital operator.  10/21/2021, 11:30 AM

## 2021-10-21 NOTE — Progress Notes (Signed)
Assessed for PIV and midline with [place].  Atempted LUE cephalic for PIV unsuccessfully.  Veins are small. Spoke with Dr Janee Morn re midline vs PICC line.  PICC line ordered dt potassium and magnesium runs and in IVF.

## 2021-10-21 NOTE — Progress Notes (Signed)
Progress Note     Subjective: Abdominal pain/bloating better from yesterday but no flatus or BM yet. Throat sore from NGT.  Objective: Vital signs in last 24 hours: Temp:  [97.8 F (36.6 C)-99.6 F (37.6 C)] 98.4 F (36.9 C) (01/05 0626) Pulse Rate:  [73-86] 81 (01/05 0626) Resp:  [12-20] 14 (01/05 0626) BP: (92-170)/(72-94) 170/94 (01/05 0626) SpO2:  [94 %-100 %] 100 % (01/05 0626) Weight:  [96.5 kg] 96.5 kg (01/04 1751) Last BM Date: 10/19/21  Intake/Output from previous day: 01/04 0701 - 01/05 0700 In: 1586 [I.V.:1292.9; IV Piggyback:293] Out: 150 [Emesis/NG output:150] Intake/Output this shift: No intake/output data recorded.  PE: General: pleasant, WD, female who is laying in bed in NAD HEENT: head is normocephalic, atraumatic. Mouth is pink and moist Heart: no cyanosis. No peripheral edema Lungs:  Respiratory effort nonlabored on room air Abd: soft, ND, mild TTP without rebound or guarding periumbilically. NGT with small amount of bilious output  MSK: all 4 extremities are symmetrical with no cyanosis, clubbing, or edema. Skin: warm and dry Psych: A&Ox3 with an appropriate affect.    Lab Results:  Recent Labs    10/20/21 0550 10/21/21 0446  WBC 9.1 5.6  HGB 11.9* 11.1*  HCT 37.6 34.8*  PLT 302 259   BMET Recent Labs    10/20/21 0550 10/21/21 0446  NA 138 138  K 3.5 4.0  CL 104 107  CO2 26 24  GLUCOSE 143* 109*  BUN 8 6*  CREATININE 0.60 0.53  CALCIUM 8.9 8.4*   PT/INR No results for input(s): LABPROT, INR in the last 72 hours. CMP     Component Value Date/Time   NA 138 10/21/2021 0446   K 4.0 10/21/2021 0446   CL 107 10/21/2021 0446   CO2 24 10/21/2021 0446   GLUCOSE 109 (H) 10/21/2021 0446   BUN 6 (L) 10/21/2021 0446   CREATININE 0.53 10/21/2021 0446   CREATININE 0.68 06/24/2020 1442   CALCIUM 8.4 (L) 10/21/2021 0446   CALCIUM 9.7 06/24/2011 0952   PROT 7.8 10/20/2021 0550   ALBUMIN 3.7 10/20/2021 0550   AST 18 10/20/2021 0550    ALT 16 10/20/2021 0550   ALKPHOS 72 10/20/2021 0550   BILITOT 1.0 10/20/2021 0550   GFRNONAA >60 10/21/2021 0446   GFRNONAA 93 06/24/2020 1442   GFRAA 108 06/24/2020 1442   Lipase     Component Value Date/Time   LIPASE 24 10/19/2021 2140       Studies/Results: CT ABDOMEN PELVIS W CONTRAST  Result Date: 10/19/2021 CLINICAL DATA:  Evaluate for bowel obstruction. EXAM: CT ABDOMEN AND PELVIS WITH CONTRAST TECHNIQUE: Multidetector CT imaging of the abdomen and pelvis was performed using the standard protocol following bolus administration of intravenous contrast. CONTRAST:  75mL OMNIPAQUE IOHEXOL 350 MG/ML SOLN COMPARISON:  CT abdomen and pelvis 08/25/2018. FINDINGS: Lower chest: No acute abnormality. Hepatobiliary: No focal liver abnormality is seen. Status post cholecystectomy. No biliary dilatation. Pancreas: Unremarkable. No pancreatic ductal dilatation or surrounding inflammatory changes. Spleen: Normal in size without focal abnormality. Adrenals/Urinary Tract: Adrenal glands are unremarkable. Kidneys are normal, without renal calculi, focal lesion, or hydronephrosis. Bladder is unremarkable. Stomach/Bowel: There are fluid-filled mildly dilated central small bowel loops, many of which approximate the anterior abdominal wall. There are few tiny foci of extraluminal gas in the anterior abdomen adjacent to dilated small bowel loops image 2/49 and 50. There is no definitive wall thickening. Definitive transition point is not seen, but distal small bowel is decompressed in the  pelvis. Colon and appendix are within normal limits. There is a small hiatal hernia. The stomach is otherwise within normal limits. Vascular/Lymphatic: No significant vascular findings are present. No enlarged abdominal or pelvic lymph nodes. Reproductive: Uterus and bilateral adnexa are unremarkable. Other: There is no ascites. There is scarring in the anterior abdominal wall. Musculoskeletal: No acute or significant osseous  findings. IMPRESSION: Dilated central small bowel loops approximating the anterior abdominal wall worrisome for small bowel obstruction, likely secondary to adhesions. There is a small amount of extraluminal air suspicious for micro perforation. Electronically Signed   By: Darliss CheneyAmy  Guttmann M.D.   On: 10/19/2021 23:08   DG Abd 2 Views  Result Date: 10/20/2021 CLINICAL DATA:  Small bowel obstruction. EXAM: ABDOMEN - 2 VIEW COMPARISON:  Earlier same day FINDINGS: No gaseous small bowel dilatation to suggest overt obstruction. Gas-filled small bowel loop in the medial right abdomen shows 2.6 cm diameter. Colon is largely gasless is well. Contrast material in the urinary bladder is compatible with CT scan yesterday. NG tube tip is at or near the pylorus. IMPRESSION: No gas-filled dilated small bowel loops to suggest bowel obstruction. NG tube tip is positioned in the pyloric region of the stomach. Electronically Signed   By: Kennith CenterEric  Mansell M.D.   On: 10/20/2021 07:49   DG Abd Portable 1V-Small Bowel Obstruction Protocol-initial, 8 hr delay  Result Date: 10/20/2021 CLINICAL DATA:  Bowel obstruction EXAM: PORTABLE ABDOMEN - 1 VIEW COMPARISON:  10/20/2021 FINDINGS: Frontal view of the abdomen and pelvis excludes the pubic symphysis by collimation. Oral contrast has progressed throughout the colon by the time of the exam. No evidence of bowel obstruction. No masses or abnormal calcifications. Enteric catheter tip projects over the gastric antrum. IMPRESSION: 1. No evidence of bowel obstruction. Oral contrast throughout the colon. 2. Enteric catheter tip projects over gastric antrum. Electronically Signed   By: Sharlet SalinaMichael  Brown M.D.   On: 10/20/2021 17:02   DG Abd Portable 1 View  Result Date: 10/20/2021 CLINICAL DATA:  NG tube placement. EXAM: PORTABLE ABDOMEN - 1 VIEW COMPARISON:  10/20/2021. FINDINGS: The bowel gas pattern is normal. An enteric tube terminates over the stomach. No abnormal calcification is seen.  Surgical clips are present in the right upper quadrant. Excreted contrast is noted in the renal collecting systems. The cardiac silhouette is enlarged. IMPRESSION: The enteric tube terminates in the stomach. Electronically Signed   By: Thornell SartoriusLaura  Taylor M.D.   On: 10/20/2021 04:23   DG Abd Portable 1 View  Result Date: 10/20/2021 CLINICAL DATA:  NG tube placement. EXAM: PORTABLE ABDOMEN - 1 VIEW COMPARISON:  10/19/2021. FINDINGS: The bowel gas pattern is normal. An enteric tube terminates over the stomach. Excreted contrast is noted in the renal collecting systems bilaterally and urinary bladder. IMPRESSION: An enteric tube terminates over the stomach. Electronically Signed   By: Thornell SartoriusLaura  Taylor M.D.   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 Small amount of extraluminal air suspicious for micro perforation. Clinically the patient does not have worrisome signs of small bowel perforation (peritonitis, hemodynamic instability, acidosis). Abdominal exam is remains benign for perforation - NGT with 150 ml output last 24H - 8 hour delay abd xray with contrast in colon - no bowel function yet - continue LIWS for now but if bowel function today then plan to remove NGT and start clears - ambulate  FEN: NPO/NGT, LR @ 125 ml/hr ID: [email or web address] VTE: lovenox   Below per TRH --   GERD HTN Hyperglycemia      Straightforward Medical Decision Making   LOS: 1 day   Eric Form, [facility] 10/21/2021, 9:26 AM Please see Amion for pager number during day hours 7:00am-4:30pm

## 2021-10-22 DIAGNOSIS — I10 Essential (primary) hypertension: Secondary | ICD-10-CM | POA: Diagnosis not present

## 2021-10-22 DIAGNOSIS — K56609 Unspecified intestinal obstruction, unspecified as to partial versus complete obstruction: Secondary | ICD-10-CM | POA: Diagnosis not present

## 2021-10-22 DIAGNOSIS — K219 Gastro-esophageal reflux disease without esophagitis: Secondary | ICD-10-CM | POA: Diagnosis not present

## 2021-10-22 DIAGNOSIS — K565 Intestinal adhesions [bands], unspecified as to partial versus complete obstruction: Secondary | ICD-10-CM | POA: Diagnosis not present

## 2021-10-22 LAB — BASIC METABOLIC PANEL
Anion gap: 11 (ref 5–15)
BUN: 7 mg/dL — ABNORMAL LOW (ref 8–23)
CO2: 22 mmol/L (ref 22–32)
Calcium: 8.9 mg/dL (ref 8.9–10.3)
Chloride: 106 mmol/L (ref 98–111)
Creatinine, Ser: 0.61 mg/dL (ref 0.44–1.00)
GFR, Estimated: >60 mL/min (ref >60)
Glucose, Bld: 79 mg/dL (ref 70–99)
Potassium: 3.9 mmol/L (ref 3.5–5.1)
Sodium: 139 mmol/L (ref 135–145)

## 2021-10-22 LAB — CBC
HCT: 33.4 % — ABNORMAL LOW (ref 36.0–46.0)
Hemoglobin: 10.8 g/dL — ABNORMAL LOW (ref 12.0–15.0)
MCH: 27 pg (ref 26.0–34.0)
MCHC: 32.3 g/dL (ref 30.0–36.0)
MCV: 83.5 fL (ref 80.0–100.0)
Platelets: 236 10*3/uL (ref 150–400)
RBC: 4 MIL/uL (ref 3.87–5.11)
RDW: 13.5 % (ref 11.5–15.5)
WBC: 5.5 10*3/uL (ref 4.0–10.5)
nRBC: 0 % (ref 0.0–0.2)

## 2021-10-22 LAB — MAGNESIUM: Magnesium: 2 mg/dL (ref 1.7–2.4)

## 2021-10-22 NOTE — Plan of Care (Addendum)
PRN Hydralazine given overnight per orders.  Problem: Education: Goal: Knowledge of General Education information will improve Description: Including pain rating scale, medication(s)/side effects and non-pharmacologic comfort measures Outcome: Progressing   Problem: Health Behavior/Discharge Planning: Goal: Ability to manage health-related needs will improve Outcome: Progressing   Problem: Clinical Measurements: Goal: Ability to maintain clinical measurements within normal limits will improve Outcome: Progressing Goal: Will remain free from infection Outcome: Progressing Goal: Diagnostic test results will improve Outcome: Progressing Goal: Respiratory complications will improve Outcome: Progressing Goal: Cardiovascular complication will be avoided Outcome: Progressing   Problem: Activity: Goal: Risk for activity intolerance will decrease Outcome: Progressing   Problem: Coping: Goal: Level of anxiety will decrease Outcome: Progressing   Problem: Elimination: Goal: Will not experience complications related to bowel motility Outcome: Progressing Goal: Will not experience complications related to urinary retention Outcome: Progressing   Problem: Pain Managment: Goal: General experience of comfort will improve Outcome: Progressing   Problem: Safety: Goal: Ability to remain free from injury will improve Outcome: Progressing   Problem: Skin Integrity: Goal: Risk for impaired skin integrity will decrease Outcome: Progressing

## 2021-10-22 NOTE — Progress Notes (Signed)
PROGRESS NOTE    Charlann BoxerLinda P Cardinal  QMV:[id number] DOB: 09/30/1956 DOA: 10/19/2021 PCP: Corwin LevinsJohn, James W, MD    Chief Complaint  Patient presents with   Abdominal Pain   Emesis    Brief Narrative:  Patient 66 year old female history of multiple abdominal surgeries in the past, history of frequent hospitalizations for SBO, hypertension, GERD, gout presenting to the ED with complaints of abdominal pain nausea vomiting.  CT imaging done concerning for small bowel obstruction likely secondary to adhesions.  There is also some concern for microperforations.  Patient placed empirically on IV antibiotics.  NG tube placed.  General surgery consulted.   Assessment & Plan:   Principal Problem:   Small bowel obstruction due to adhesions [facility](HCC) Active Problems:   Essential hypertension   GERD without esophagitis   Hyperglycemia  #1 small bowel obstruction likely secondary to adhesions, POA -Patient with history of multiple abdominal surgeries, history of multiple small bowel obstructions.  Secondary to adhesions. -Patient presented with abdominal pain, nausea vomiting, CT scan concerning for small bowel obstruction with associated microperforation. -Patient afebrile, normal white count, no rebound or guarding on examination -NG tube placed with low intermittent suction with bilious material noted. -Continue IV Zosyn. -Replete electrolytes to keep potassium approximately 4, magnesium approximately 2. -General surgery consulted and small bowel protocol underway with last abdominal films from 10/20/2021 showing contrast throughout the colon.. -Patient with some clinical improvement, no nausea or vomiting.  -Improvement with abdominal pain.  -Still with no flatus, no bowel movement.  -Per general surgery continue NG tube until bowel function returns and then can subsequently placed on a trial of clears and discontinue NG tube.  -If no improvement with conservative treatment would likely need surgical  exploration. -IV fluids, supportive care, bowel rest. -Per general surgery.  2.  Hypertension -Currently bowel rest and as such holding oral antihypertensive medications. -Continue IV Vasotec.    3.  GERD without esophagitis -IV PPI, when taking in adequate oral intake with resolution of small bowel obstruction could transition to oral PPI.    4.  Hyperglycemia -Likely stress-induced. -Hemoglobin A1c 6.7. -We will need outpatient follow-up.   DVT prophylaxis: Lovenox Code Status: Full Family Communication: Updated patient.  Updated husband and cousin at bedside.   Disposition:   Status is: Inpatient  Remains inpatient appropriate because: Severity of illness       Consultants:  General surgery: Dr. Freida BusmanAllen 10/20/2021  Procedures:  CT abdomen and pelvis 10/19/2021 Small bowel protocol   Antimicrobials: IV Zosyn 10/20/2021>>>>>   Subjective: Sitting up in chair just come back from walking around unit.  Denies any chest pain.  No shortness of breath.  Still with abdominal pain that is slowly improving.  No nausea or emesis.  No flatus.  No bowel movement.  Husband and cousin at bedside.  Objective: Vitals:   10/21/21 1222 10/21/21 2044 10/22/21 0039 10/22/21 0512  BP: (!) 145/84 (!) 148/76 140/79 134/71  Pulse: 65 81 75 70  Resp: 16 17 16 17   Temp: 97.6 F (36.4 C) 98.3 F (36.8 C) 97.6 F (36.4 C)   TempSrc: Oral Oral Oral   SpO2: 100% 93%  100%  Weight:      Height:        Intake/Output Summary (Last 24 hours) at 10/22/2021 1102 Last data filed at 10/22/2021 0519 Gross per 24 hour  Intake 30 ml  Output 300 ml  Net -270 ml    Filed Weights   10/19/21 2015 10/20/21 1751  Weight: 96.6 kg 96.5 kg    Examination:  General exam: NG tube in place with bilious material. Respiratory system: Lungs clear to auscultation bilaterally.  No wheezes, no crackles, no rhonchi.  Normal respiratory effort.  Speaking in full sentences.   Cardiovascular system: RRR no  murmurs rubs or gallops.  No JVD.  No lower extremity edema.  Gastrointestinal system: Abdomen is soft, decreased tenderness to palpation in the epigastrium and right upper quadrant.  Decreased tenderness in the mid abdominal region.  Hypoactive bowel sounds.  No rebound.  No guarding.  Central nervous system: Alert and oriented. No focal neurological deficits. Extremities: Symmetric 5 x 5 power. Skin: No rashes, lesions or ulcers Psychiatry: Judgement and insight appear normal. Mood & affect appropriate.   Data Reviewed: I have personally reviewed following labs and imaging studies  CBC: Recent Labs  Lab 10/19/21 2140 10/20/21 0550 10/21/21 0446 10/22/21 0435  WBC 8.7 9.1 5.6 5.5  NEUTROABS 6.7 7.5 2.9  --   HGB 13.3 11.9* 11.1* 10.8*  HCT 41.6 37.6 34.8* 33.4*  MCV 82.9 83.2 82.9 83.5  PLT 336 302 259 236     Basic Metabolic Panel: Recent Labs  Lab 10/19/21 2140 10/20/21 0550 10/21/21 0446 10/22/21 0435  NA 137 138 138 139  K 4.3 3.5 4.0 3.9  CL 105 104 107 106  CO2 23 26 24 22   GLUCOSE 184* 143* 109* 79  BUN 11 8 6* 7*  CREATININE 0.63 0.60 0.53 0.61  CALCIUM 9.7 8.9 8.4* 8.9  MG  --  2.0 1.8 2.0     GFR: Estimated Creatinine Clearance: 73.1 mL/min (by C-G formula based on SCr of 0.61 mg/dL).  Liver Function Tests: Recent Labs  Lab 10/19/21 2140 10/20/21 0550  AST 23 18  ALT 18 16  ALKPHOS 85 72  BILITOT 1.1 1.0  PROT 8.6* 7.8  ALBUMIN 4.4 3.7     CBG: No results for input(s): GLUCAP in the last 168 hours.   Recent Results (from the past 240 hour(s))  Resp Panel by RT-PCR (Flu A&B, Covid) Nasopharyngeal Swab     Status: None   Collection Time: 10/20/21  2:30 AM   Specimen: Nasopharyngeal Swab; Nasopharyngeal(NP) swabs in vial transport medium  Result Value Ref Range Status   SARS Coronavirus 2 by RT PCR NEGATIVE NEGATIVE Final    Comment: (NOTE) SARS-CoV-2 target nucleic acids are NOT DETECTED.  The SARS-CoV-2 RNA is generally detectable  in upper respiratory specimens during the acute phase of infection. The lowest concentration of SARS-CoV-2 viral copies this assay can detect is 138 copies/mL. A negative result does not preclude SARS-Cov-2 infection and should not be used as the sole basis for treatment or other patient management decisions. A negative result may occur with  improper specimen collection/handling, submission of specimen other than nasopharyngeal swab, presence of viral mutation(s) within the areas targeted by this assay, and inadequate number of viral copies(<138 copies/mL). A negative result must be combined with clinical observations, patient history, and epidemiological information. The expected result is Negative.  Fact Sheet for Patients:  12/18/21  Fact Sheet for Healthcare Providers:  BloggerCourse.com  This test is no t yet approved or cleared by the [place] FDA and  has been authorized for detection and/or diagnosis of SARS-CoV-2 by FDA under an Emergency Use Authorization (EUA). This EUA will remain  in effect (meaning this test can be used) for the duration of the COVID-19 declaration under Section 564(b)(1) of the Act, 21 U.S.C.section 360bbb-3(b)(1),  unless the authorization is terminated  or revoked sooner.       Influenza A by PCR NEGATIVE NEGATIVE Final   Influenza B by PCR NEGATIVE NEGATIVE Final    Comment: (NOTE) The Xpert Xpress SARS-CoV-2/FLU/RSV plus assay is intended as an aid in the diagnosis of influenza from Nasopharyngeal swab specimens and should not be used as a sole basis for treatment. Nasal washings and aspirates are unacceptable for Xpert Xpress SARS-CoV-2/FLU/RSV testing.  Fact Sheet for Patients: BloggerCourse.com  Fact Sheet for Healthcare Providers: SeriousBroker.it  This test is not yet approved or cleared by the [place] FDA and has  been authorized for detection and/or diagnosis of SARS-CoV-2 by FDA under an Emergency Use Authorization (EUA). This EUA will remain in effect (meaning this test can be used) for the duration of the COVID-19 declaration under Section 564(b)(1) of the Act, 21 U.S.C. section 360bbb-3(b)(1), unless the authorization is terminated or revoked.  Performed at [facility], [address], [place], [place] [address]           Radiology Studies: DG Abd Portable 1V-Small Bowel Obstruction Protocol-initial, 8 hr delay  Result Date: 10/20/2021 CLINICAL DATA:  Bowel obstruction EXAM: PORTABLE ABDOMEN - 1 VIEW COMPARISON:  10/20/2021 FINDINGS: Frontal view of the abdomen and pelvis excludes the pubic symphysis by collimation. Oral contrast has progressed throughout the colon by the time of the exam. No evidence of bowel obstruction. No masses or abnormal calcifications. Enteric catheter tip projects over the gastric antrum. IMPRESSION: 1. No evidence of bowel obstruction. Oral contrast throughout the colon. 2. Enteric catheter tip projects over gastric antrum. Electronically Signed   By: Sharlet Salina M.D.   On: 10/20/2021 17:02   [place] EKG SITE RITE  Result Date: 10/21/2021 If Site Rite image not attached, placement could not be confirmed due to current cardiac rhythm.       Scheduled Meds:  enalaprilat  1.25 mg Intravenous Q6H   enoxaparin (LOVENOX) injection  40 mg Subcutaneous Q24H   pantoprazole (PROTONIX) IV  40 mg Intravenous QHS   Continuous Infusions:  lactated ringers with kcl 125 mL/hr at 10/22/21 0518   piperacillin-tazobactam (ZOSYN)  IV 3.375 g (10/22/21 0519)     LOS: 2 days    Time spent: 35 minutes    Ramiro Harvest, MD [facility]   To contact the attending provider between 7A-7P or the covering provider during after hours 7P-7A, please log into the web site [email or web address] and access using universal [place] password for that web site.  If you do not have the password, please call the hospital operator.  10/22/2021, 11:02 AM

## 2021-10-22 NOTE — Progress Notes (Addendum)
Progress Note     Subjective: Only a little sore periumbilically today - better than yesterday. No bloating. No flatus or BM yet. No nausea. Ambulated in the hall once yesterday  Objective: Vital signs in last 24 hours: Temp:  [97.6 F (36.4 C)-98.3 F (36.8 C)] 97.6 F (36.4 C) (01/06 0039) Pulse Rate:  [65-81] 70 (01/06 0512) Resp:  [16-17] 17 (01/06 0512) BP: (134-148)/(71-84) 134/71 (01/06 0512) SpO2:  [93 %-100 %] 100 % (01/06 0512) Last BM Date: 10/19/21  Intake/Output from previous day: 01/05 0701 - 01/06 0700 In: 30 [NG/GT:30] Out: 300 [Emesis/NG output:300] Intake/Output this shift: No intake/output data recorded.  PE: General: pleasant, WD, female who is laying in bed in NAD HEENT: head is normocephalic, atraumatic. Mouth is pink and moist Heart: no cyanosis. No peripheral edema Lungs:  Respiratory effort nonlabored on room air. CTAB Abd: soft, ND, very mild TTP without rebound or guarding periumbilically. NGT with small amount of bilious output  MSK: all 4 extremities are symmetrical with no cyanosis, clubbing, or edema. Skin: warm and dry Psych: A&Ox3 with an appropriate affect.    Lab Results:  Recent Labs    10/21/21 0446 10/22/21 0435  WBC 5.6 5.5  HGB 11.1* 10.8*  HCT 34.8* 33.4*  PLT 259 236    BMET Recent Labs    10/21/21 0446 10/22/21 0435  NA 138 139  K 4.0 3.9  CL 107 106  CO2 24 22  GLUCOSE 109* 79  BUN 6* 7*  CREATININE 0.53 0.61  CALCIUM 8.4* 8.9    PT/INR No results for input(s): LABPROT, INR in the last 72 hours. CMP     Component Value Date/Time   NA 139 10/22/2021 0435   K 3.9 10/22/2021 0435   CL 106 10/22/2021 0435   CO2 22 10/22/2021 0435   GLUCOSE 79 10/22/2021 0435   BUN 7 (L) 10/22/2021 0435   CREATININE 0.61 10/22/2021 0435   CREATININE 0.68 06/24/2020 1442   CALCIUM 8.9 10/22/2021 0435   CALCIUM 9.7 06/24/2011 0952   PROT 7.8 10/20/2021 0550   ALBUMIN 3.7 10/20/2021 0550   AST 18 10/20/2021 0550    ALT 16 10/20/2021 0550   ALKPHOS 72 10/20/2021 0550   BILITOT 1.0 10/20/2021 0550   GFRNONAA >60 10/22/2021 0435   GFRNONAA 93 06/24/2020 1442   GFRAA 108 06/24/2020 1442   Lipase     Component Value Date/Time   LIPASE 24 10/19/2021 2140       Studies/Results: DG Abd Portable 1V-Small Bowel Obstruction Protocol-initial, 8 hr delay  Result Date: 10/20/2021 CLINICAL DATA:  Bowel obstruction EXAM: PORTABLE ABDOMEN - 1 VIEW COMPARISON:  10/20/2021 FINDINGS: Frontal view of the abdomen and pelvis excludes the pubic symphysis by collimation. Oral contrast has progressed throughout the colon by the time of the exam. No evidence of bowel obstruction. No masses or abnormal calcifications. Enteric catheter tip projects over the gastric antrum. IMPRESSION: 1. No evidence of bowel obstruction. Oral contrast throughout the colon. 2. Enteric catheter tip projects over gastric antrum. Electronically Signed   By: Sharlet Salina M.D.   On: 10/20/2021 17:02   [place] EKG SITE RITE  Result Date: 10/21/2021 If Site Rite image not attached, placement could not be confirmed due to current cardiac rhythm.   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 Small amount of extraluminal air suspicious for micro perforation. Clinically the patient does not have worrisome signs of small bowel perforation (peritonitis, hemodynamic  instability, acidosis). Abdominal exam is remains benign for perforation - NGT with 300 ml output last 24H - 8 hour delay abd xray with contrast in colon - still no bowel function - continue LIWS for now but if bowel function today then plan to remove NGT and start clears - ambulate in hallway today  FEN: NPO/NGT, LR @ 125 ml/hr ID: [email or web address] VTE: lovenox   Below per TRH --  GERD HTN Hyperglycemia      low Medical Decision Making   LOS: 2 days   Eric Form, [facility] 10/22/2021, 8:14 AM Please see Amion for pager number during day hours 7:00am-4:30pm

## 2021-10-23 ENCOUNTER — Inpatient Hospital Stay (HOSPITAL_COMMUNITY): Payer: 59

## 2021-10-23 DIAGNOSIS — K56609 Unspecified intestinal obstruction, unspecified as to partial versus complete obstruction: Secondary | ICD-10-CM | POA: Diagnosis not present

## 2021-10-23 DIAGNOSIS — K219 Gastro-esophageal reflux disease without esophagitis: Secondary | ICD-10-CM | POA: Diagnosis not present

## 2021-10-23 DIAGNOSIS — I10 Essential (primary) hypertension: Secondary | ICD-10-CM | POA: Diagnosis not present

## 2021-10-23 DIAGNOSIS — K565 Intestinal adhesions [bands], unspecified as to partial versus complete obstruction: Secondary | ICD-10-CM | POA: Diagnosis not present

## 2021-10-23 MED ORDER — PANTOPRAZOLE SODIUM 40 MG PO TBEC
40.0000 mg | DELAYED_RELEASE_TABLET | Freq: Every day | ORAL | Status: DC
  Administered 2021-10-23: 40 mg via ORAL
  Filled 2021-10-23: qty 1

## 2021-10-23 MED ORDER — HYDRALAZINE HCL 20 MG/ML IJ SOLN
10.0000 mg | Freq: Four times a day (QID) | INTRAMUSCULAR | Status: DC | PRN
  Administered 2021-10-23: 10 mg via INTRAVENOUS

## 2021-10-23 MED ORDER — AMLODIPINE BESYLATE 5 MG PO TABS
5.0000 mg | ORAL_TABLET | Freq: Every day | ORAL | Status: DC
  Administered 2021-10-23 – 2021-10-24 (×2): 5 mg via ORAL
  Filled 2021-10-23 (×2): qty 1

## 2021-10-23 MED ORDER — IRBESARTAN 300 MG PO TABS
300.0000 mg | ORAL_TABLET | Freq: Every day | ORAL | Status: DC
  Administered 2021-10-23 – 2021-10-24 (×2): 300 mg via ORAL
  Filled 2021-10-23 (×2): qty 1

## 2021-10-23 MED ORDER — ASPIRIN EC 81 MG PO TBEC
81.0000 mg | DELAYED_RELEASE_TABLET | Freq: Every day | ORAL | Status: DC
  Administered 2021-10-23 – 2021-10-24 (×2): 81 mg via ORAL
  Filled 2021-10-23 (×2): qty 1

## 2021-10-23 NOTE — Progress Notes (Signed)
° °  Subjective/Chief Complaint: Patient had a bowel movement this morning No abdominal distention or tenderness Tolerating clears without difficulty Plain films - normal today  Objective: Vital signs in last 24 hours: Temp:  [97.8 F (36.6 C)-98.9 F (37.2 C)] 98.3 F (36.8 C) (01/07 0634) Pulse Rate:  [60-71] 71 (01/07 0634) Resp:  [16-20] 18 (01/07 0634) BP: (119-172)/(66-92) 140/82 (01/07 0634) SpO2:  [99 %-100 %] 100 % (01/07 0634) Last BM Date: 10/19/21  Intake/Output from previous day: 01/06 0701 - 01/07 0700 In: 9371.6 [P.O.:120; I.V.:6492.5; IV Piggyback:353.7] Out: -  Intake/Output this shift: No intake/output data recorded.  WDWN in NAD Abd - soft, non-distended, non-tender  Lab Results:  Recent Labs    10/21/21 0446 10/22/21 0435  WBC 5.6 5.5  HGB 11.1* 10.8*  HCT 34.8* 33.4*  PLT 259 236   BMET Recent Labs    10/21/21 0446 10/22/21 0435  NA 138 139  K 4.0 3.9  CL 107 106  CO2 24 22  GLUCOSE 109* 79  BUN 6* 7*  CREATININE 0.53 0.61  CALCIUM 8.4* 8.9   PT/INR No results for input(s): LABPROT, INR in the last 72 hours. ABG No results for input(s): PHART, HCO3 in the last 72 hours.  Invalid input(s): PCO2, PO2  Studies/Results: DG Abd 1 View  Result Date: 10/23/2021 CLINICAL DATA:  Follow-up small bowel obstruction. EXAM: ABDOMEN - 1 VIEW COMPARISON:  10/20/2021 and CT dated 10/19/2021. FINDINGS: There is residual contrast within a normal caliber colon. No small bowel dilation to suggest small bowel obstruction or significant adynamic ileus. IMPRESSION: 1. No acute findings. No evidence of small-bowel obstruction or significant adynamic ileus. Electronically Signed   By: Amie Portland M.D.   On: 10/23/2021 08:19   [place] EKG SITE RITE  Result Date: 10/21/2021 If Site Rite image not attached, placement could not be confirmed due to current cardiac rhythm.   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

## 2021-10-23 NOTE — Progress Notes (Signed)
PROGRESS NOTE    Bianca Campbell  TZG:[id number] DOB: 1955-11-17 DOA: 10/19/2021 PCP: [name], MD    Chief Complaint  Patient presents with   Abdominal Pain   Emesis    Brief Narrative:  Patient 66 year old female history of multiple abdominal surgeries in the past, history of frequent hospitalizations for SBO, hypertension, GERD, gout presenting to the ED with complaints of abdominal pain nausea vomiting.  CT imaging done concerning for small bowel obstruction likely secondary to adhesions.  There is also some concern for microperforations.  Patient placed empirically on IV antibiotics.  NG tube placed.  General surgery consulted.   Assessment & Plan:   Principal Problem:   Small bowel obstruction due to adhesions [facility]) Active Problems:   Essential hypertension   GERD without esophagitis   Hyperglycemia  #1 small bowel obstruction likely secondary to adhesions, POA -Patient with history of multiple abdominal surgeries, history of multiple small bowel obstructions.  Secondary to adhesions. -Patient presented with abdominal pain, nausea vomiting, CT scan concerning for small bowel obstruction with associated microperforation. -Patient afebrile, normal white count, no rebound or guarding on examination -NG tube placed with low intermittent suction with bilious material noted. -Continue IV Zosyn. -Replete electrolytes to keep potassium approximately 4, magnesium approximately 2. -General surgery consulted and small bowel protocol underway with last abdominal films from 10/20/2021 showing contrast throughout the colon. -Improved clinically, noted to have flatus and bowel movement overnight. -NG tube discontinued yesterday afternoon. -Patient was on clear liquids and diet advanced to full liquids per general surgery. -Plain films today with normal bowels with no obstruction noted. -Continue full liquids diet through today and advance to a soft diet for breakfast. -Decrease IV  fluid rate. -If continued improvement and tolerating soft diet and cleared by general surgery may be discharged tomorrow 10/24/2021.  2.  Hypertension -Was on bowel rest secondary to SBO and as such oral antihypertensive medications held on admission.   -Diet has been advanced, discontinue IV Vasotec placed back on home regimen Avapro.  Place back on half home regimen of Norvasc of 5 mg daily.   -Follow.  3.  GERD without esophagitis -Patient has been advanced to a full liquid diet that she is tolerating.   -Change IV PPI to oral PPI.      4.  Hyperglycemia -Likely stress-induced. -Hemoglobin A1c 6.7. -We will need outpatient follow-up.   DVT prophylaxis: Lovenox Code Status: Full Family Communication: Updated patient and husband at bedside.  Disposition:   Status is: Inpatient  Remains inpatient appropriate because: Severity of illness       Consultants:  General surgery: Dr. Freida Busman 10/20/2021  Procedures:  CT abdomen and pelvis 10/19/2021 Small bowel protocol   Antimicrobials: IV Zosyn 10/20/2021>>>>> 10/23/2021   Subjective: Sitting up in bed.  NG tube discontinued yesterday afternoon per patient.  No nausea or vomiting.  Abdominal pain improved.  Passing flatus.  Had bowel movement last night.  Tolerated clears as well as full liquids.  Overall feeling better than she did.  Husband at bedside.    Objective: Vitals:   10/23/21 0204 10/23/21 0300 10/23/21 0634 10/23/21 1154  BP: (!) 172/84 (!) 148/81 140/82 (!) 145/95  Pulse: 60 66 71 76  Resp: 16  18 18   Temp: 98.3 F (36.8 C)  98.3 F (36.8 C) 99.1 F (37.3 C)  TempSrc:   Oral Oral  SpO2: 100%  100% 100%  Weight:      Height:  Intake/Output Summary (Last 24 hours) at 10/23/2021 1209 Last data filed at 10/23/2021 0600 Gross per 24 hour  Intake 6966.18 ml  Output --  Net 6966.18 ml    Filed Weights   10/19/21 2015 10/20/21 1751  Weight: 96.6 kg 96.5 kg    Examination:  General exam:  NAD. Respiratory system: Lungs clear to auscultation bilaterally.  No wheezes, no crackles, no rhonchi.  Normal respiratory effort.  Speaking in full sentences. Cardiovascular system: Regular rate and rhythm no murmurs rubs or gallops.  No JVD.  No lower extremity edema. Gastrointestinal system: Abdomen is soft, nontender, nondistended, positive bowel sounds.  No rebound.  No guarding.  Central nervous system: Alert and oriented. No focal neurological deficits. Extremities: Symmetric 5 x 5 power. Skin: No rashes, lesions or ulcers Psychiatry: Judgement and insight appear normal. Mood & affect appropriate.   Data Reviewed: I have personally reviewed following labs and imaging studies  CBC: Recent Labs  Lab 10/19/21 2140 10/20/21 0550 10/21/21 0446 10/22/21 0435  WBC 8.7 9.1 5.6 5.5  NEUTROABS 6.7 7.5 2.9  --   HGB 13.3 11.9* 11.1* 10.8*  HCT 41.6 37.6 34.8* 33.4*  MCV 82.9 83.2 82.9 83.5  PLT 336 302 259 236     Basic Metabolic Panel: Recent Labs  Lab 10/19/21 2140 10/20/21 0550 10/21/21 0446 10/22/21 0435  NA 137 138 138 139  K 4.3 3.5 4.0 3.9  CL 105 104 107 106  CO2 23 26 24 22   GLUCOSE 184* 143* 109* 79  BUN 11 8 6* 7*  CREATININE 0.63 0.60 0.53 0.61  CALCIUM 9.7 8.9 8.4* 8.9  MG  --  2.0 1.8 2.0     GFR: Estimated Creatinine Clearance: 73.1 mL/min (by C-G formula based on SCr of 0.61 mg/dL).  Liver Function Tests: Recent Labs  Lab 10/19/21 2140 10/20/21 0550  AST 23 18  ALT 18 16  ALKPHOS 85 72  BILITOT 1.1 1.0  PROT 8.6* 7.8  ALBUMIN 4.4 3.7     CBG: No results for input(s): GLUCAP in the last 168 hours.   Recent Results (from the past 240 hour(s))  Resp Panel by RT-PCR (Flu A&B, Covid) Nasopharyngeal Swab     Status: None   Collection Time: 10/20/21  2:30 AM   Specimen: Nasopharyngeal Swab; Nasopharyngeal(NP) swabs in vial transport medium  Result Value Ref Range Status   SARS Coronavirus 2 by RT PCR NEGATIVE NEGATIVE Final    Comment:  (NOTE) SARS-CoV-2 target nucleic acids are NOT DETECTED.  The SARS-CoV-2 RNA is generally detectable in upper respiratory specimens during the acute phase of infection. The lowest concentration of SARS-CoV-2 viral copies this assay can detect is 138 copies/mL. A negative result does not preclude SARS-Cov-2 infection and should not be used as the sole basis for treatment or other patient management decisions. A negative result may occur with  improper specimen collection/handling, submission of specimen other than nasopharyngeal swab, presence of viral mutation(s) within the areas targeted by this assay, and inadequate number of viral copies(<138 copies/mL). A negative result must be combined with clinical observations, patient history, and epidemiological information. The expected result is Negative.  Fact Sheet for Patients:  BloggerCourse.comhttps://www.fda.gov/media/152166/download  Fact Sheet for Healthcare Providers:  SeriousBroker.ithttps://www.fda.gov/media/152162/download  This test is no t yet approved or cleared by the [place] FDA and  has been authorized for detection and/or diagnosis of SARS-CoV-2 by FDA under an Emergency Use Authorization (EUA). This EUA will remain  in effect (meaning this test can be  used) for the duration of the COVID-19 declaration under Section 564(b)(1) of the Act, 21 U.S.C.section 360bbb-3(b)(1), unless the authorization is terminated  or revoked sooner.       Influenza A by PCR NEGATIVE NEGATIVE Final   Influenza B by PCR NEGATIVE NEGATIVE Final    Comment: (NOTE) The Xpert Xpress SARS-CoV-2/FLU/RSV plus assay is intended as an aid in the diagnosis of influenza from Nasopharyngeal swab specimens and should not be used as a sole basis for treatment. Nasal washings and aspirates are unacceptable for Xpert Xpress SARS-CoV-2/FLU/RSV testing.  Fact Sheet for Patients: BloggerCourse.com  Fact Sheet for Healthcare  Providers: SeriousBroker.it  This test is not yet approved or cleared by the [place] FDA and has been authorized for detection and/or diagnosis of SARS-CoV-2 by FDA under an Emergency Use Authorization (EUA). This EUA will remain in effect (meaning this test can be used) for the duration of the COVID-19 declaration under Section 564(b)(1) of the Act, 21 U.S.C. section 360bbb-3(b)(1), unless the authorization is terminated or revoked.  Performed at [facility], [address] [place], [place] [address]           Radiology Studies: DG Abd 1 View  Result Date: 10/23/2021 CLINICAL DATA:  Follow-up small bowel obstruction. EXAM: ABDOMEN - 1 VIEW COMPARISON:  10/20/2021 and CT dated 10/19/2021. FINDINGS: There is residual contrast within a normal caliber colon. No small bowel dilation to suggest small bowel obstruction or significant adynamic ileus. IMPRESSION: 1. No acute findings. No evidence of small-bowel obstruction or significant adynamic ileus. Electronically Signed   By: Amie Portland M.D.   On: 10/23/2021 08:19   [place] EKG SITE RITE  Result Date: 10/21/2021 If Site Rite image not attached, placement could not be confirmed due to current cardiac rhythm.       Scheduled Meds:  enalaprilat  1.25 mg Intravenous Q6H   enoxaparin (LOVENOX) injection  40 mg Subcutaneous Q24H   pantoprazole (PROTONIX) IV  40 mg Intravenous QHS   Continuous Infusions:  lactated ringers with kcl 125 mL/hr at 10/23/21 1135     LOS: 3 days    Time spent: 35 minutes    Ramiro Harvest, MD [facility]   To contact the attending provider between 7A-7P or the covering provider during after hours 7P-7A, please log into the web site [email or web address] and access using universal [place] password for that web site. If you do not have the password, please call the hospital operator.  10/23/2021, 12:09 PM

## 2021-10-23 NOTE — Plan of Care (Signed)
Patient tolerating FLD. Passing flatus. No GI pain, nausea, or vomiting.   Problem: Education: Goal: Knowledge of General Education information will improve Description: Including pain rating scale, medication(s)/side effects and non-pharmacologic comfort measures Outcome: Progressing   Problem: Health Behavior/Discharge Planning: Goal: Ability to manage health-related needs will improve Outcome: Progressing   Problem: Clinical Measurements: Goal: Ability to maintain clinical measurements within normal limits will improve Outcome: Progressing Goal: Will remain free from infection Outcome: Progressing Goal: Diagnostic test results will improve Outcome: Progressing Goal: Respiratory complications will improve Outcome: Progressing Goal: Cardiovascular complication will be avoided Outcome: Progressing   Problem: Activity: Goal: Risk for activity intolerance will decrease Outcome: Progressing   Problem: Nutrition: Goal: Adequate nutrition will be maintained Outcome: Progressing   Problem: Coping: Goal: Level of anxiety will decrease Outcome: Progressing   Problem: Elimination: Goal: Will not experience complications related to bowel motility Outcome: Progressing Goal: Will not experience complications related to urinary retention Outcome: Progressing   Problem: Pain Managment: Goal: General experience of comfort will improve Outcome: Progressing   Problem: Safety: Goal: Ability to remain free from injury will improve Outcome: Progressing   Problem: Skin Integrity: Goal: Risk for impaired skin integrity will decrease Outcome: Progressing

## 2021-10-24 DIAGNOSIS — K565 Intestinal adhesions [bands], unspecified as to partial versus complete obstruction: Secondary | ICD-10-CM | POA: Diagnosis not present

## 2021-10-24 DIAGNOSIS — K219 Gastro-esophageal reflux disease without esophagitis: Secondary | ICD-10-CM | POA: Diagnosis not present

## 2021-10-24 DIAGNOSIS — I10 Essential (primary) hypertension: Secondary | ICD-10-CM | POA: Diagnosis not present

## 2021-10-24 DIAGNOSIS — R739 Hyperglycemia, unspecified: Secondary | ICD-10-CM | POA: Diagnosis not present

## 2021-10-24 LAB — BASIC METABOLIC PANEL
Anion gap: 9 (ref 5–15)
BUN: <5 mg/dL — ABNORMAL LOW (ref 8–23)
CO2: 24 mmol/L (ref 22–32)
Calcium: 9 mg/dL (ref 8.9–10.3)
Chloride: 105 mmol/L (ref 98–111)
Creatinine, Ser: 0.54 mg/dL (ref 0.44–1.00)
GFR, Estimated: >60 mL/min (ref >60)
Glucose, Bld: 118 mg/dL — ABNORMAL HIGH (ref 70–99)
Potassium: 3.4 mmol/L — ABNORMAL LOW (ref 3.5–5.1)
Sodium: 138 mmol/L (ref 135–145)

## 2021-10-24 LAB — MAGNESIUM: Magnesium: 1.6 mg/dL — ABNORMAL LOW (ref 1.7–2.4)

## 2021-10-24 MED ORDER — PANTOPRAZOLE SODIUM 40 MG PO TBEC: 40.0000 mg | DELAYED_RELEASE_TABLET | Freq: Every day | ORAL | 1 refills | Status: DC

## 2021-10-24 MED ORDER — MAGNESIUM SULFATE 4 GM/100ML IV SOLN
4.0000 g | Freq: Once | INTRAVENOUS | Status: AC
  Administered 2021-10-24: 4 g via INTRAVENOUS
  Filled 2021-10-24: qty 100

## 2021-10-24 MED ORDER — POTASSIUM CHLORIDE CRYS ER 20 MEQ PO TBCR
40.0000 meq | EXTENDED_RELEASE_TABLET | Freq: Once | ORAL | Status: AC
  Administered 2021-10-24: 40 meq via ORAL
  Filled 2021-10-24: qty 2

## 2021-10-24 NOTE — Discharge Summary (Signed)
Physician Discharge Summary  Bianca Campbell KKX:[id number] DOB: 1956/05/27 DOA: 10/19/2021  PCP: Bianca Levins, MD  Admit date: 10/19/2021 Discharge date: 10/24/2021  Time spent: 55 minutes  Recommendations for Outpatient Follow-up:  Follow-up with Bianca Levins, MD in 2 weeks.  On follow-up patient will need a basic metabolic profile done to follow-up on electrolytes and renal function as well as a magnesium level.  Patient blood pressure need to be followed up upon.   Discharge Diagnoses:  Principal Problem:   Small bowel obstruction due to adhesions [facility]) Active Problems:   Essential hypertension   GERD without esophagitis   Hyperglycemia   Discharge Condition: Stable and improved  Diet recommendation: Heart healthy  Filed Weights   10/19/21 2015 10/20/21 1751  Weight: 96.6 kg 96.5 kg    History of present illness:  HPI per Dr. Leafy Half 66 year old female with past medical history of multiple abdominal surgeries as well as frequent hospitalizations for small bowel obstructions, hypertension, gastroesophageal reflux disease, gout who presents to [facility] emergency department with complaints of abdominal pain nausea and vomiting.   Patient explains that around 12:00PM on 1/3 patient had a vegetable smoothie with several spoonfuls of flaxseeds blended in.  That afternoon at approximately 2 PM she began to experience abdominal pain.  Pain is diffuse in location without radiation.  Pain is severe in intensity without any alleviating or exacerbating factors.  Patient is also been experiencing intense nausea with multiple episodes of nonbilious nonbloody vomiting.  Patient did have a bowel movement this afternoon that was nonbloody.  Patient denies any dysuria low back pain or flank pain.  Patient denies any sick contacts or fever.  Patient denies any recent ingestion of undercooked food.   Due to patient's rapidly worsening symptoms the patient eventually presented to [facility] emergency department for evaluation.   Upon evaluation in the emergency department CT imaging of the abdomen pelvis revealed evidence of a small bowel obstruction, likely secondary to adhesions.  Radiology questioned the presence of a microperforation.  ER provider then discussed case with Dr. Donell Campbell who agreed with initiation of antibiotics although felt microperforation was unlikely.  Dr. Donell Campbell agreed to see the patient in consultation.  The hospitalist group is now been called to assess patient for admission the hospital.   Hospital Course:  #1 small bowel obstruction likely secondary to adhesions, POA--resolved. -Patient with history of multiple abdominal surgeries, history of multiple small bowel obstructions.  Secondary to adhesions. -Patient presented with abdominal pain, nausea vomiting, CT scan concerning for small bowel obstruction with associated microperforation. -Patient afebrile, normal white count, no rebound or guarding on examination -NG tube placed with low intermittent suction with bilious material noted during the hospitalization. -Patient also placed on IV Zosyn initially due to concern for microperforation. -Electrolytes repleted. -General surgery consulted and small bowel protocol placed per general surgery, with abdominal films from 10/20/2021 showing contrast throughout the colon. -Patient placed on conservative treatment, mobilization. -Improved clinically, noted to have flatus and bowel movement, and subsequently NG tube discontinued.   -Patient continues to improve clinically, started on clear liquids which she tolerated and diet advanced to full liquids and subsequently a soft diet which she tolerated.   -Repeat abdominal films with normal bowels with no obstruction noted.  -Patient followed by general surgery throughout the hospitalization and cleared by general surgery for discharge.   -Patient was discharged home in stable and improved condition.    2.   Hypertension -Was on  bowel rest secondary to SBO and as such oral antihypertensive medications held on admission. -Patient initially maintained on IV Vasotec and transition back to home regimen of Avapro and half home regimen of Norvasc once diet was advanced and she was tolerating a diet. -Patient be discharged back home on home regimen of antihypertensive medications. -Outpatient follow-up with PCP.  3.  GERD without esophagitis -Patient diet was advanced to clear liquid diet as patient improved clinically and subsequently to a soft diet which patient tolerated.   -Patient maintained on PPI during the hospitalization patient will be discharged on oral PPI daily.   -Outpatient follow-up with PCP.    4.  Hyperglycemia -Likely stress-induced. -Hemoglobin A1c 6.7. -Outpatient follow-up with PCP.      Procedures: CT abdomen and pelvis 10/19/2021 Small bowel protocol    Consultations: General surgery: Dr. Freida Campbell 10/20/2021  Discharge Exam: Vitals:   10/24/21 0905 10/24/21 1213  BP: (!) 148/97 121/83  Pulse:  88  Resp:  20  Temp:  98.2 F (36.8 C)  SpO2:  99%    General: NAD Cardiovascular: RRR no murmurs rubs or gallops.  No JVD.  No lower extremity edema. Respiratory: CTA B.  No wheezes, no crackles, no rhonchi.  Discharge Instructions   Discharge Instructions     Diet - low sodium heart healthy   Complete by: As directed    Increase activity slowly   Complete by: As directed    Increase activity slowly   Complete by: As directed       Allergies as of 10/24/2021       Reactions   Latex Itching        Medication List     TAKE these medications    acetaminophen 500 MG tablet Commonly known as: TYLENOL Take 1,000 mg by mouth every 6 (six) hours as needed for mild pain or headache.   amLODipine 10 MG tablet Commonly known as: NORVASC Take 1 tablet (10 mg total) by mouth daily.   aspirin EC 81 MG tablet Take 81 mg by mouth daily. Swallow whole.    dicyclomine 20 MG tablet Commonly known as: BENTYL TAKE ONE TABLET BY MOUTH THREE TIMES DAILY BEFORE MEAL(S)   irbesartan 300 MG tablet Commonly known as: AVAPRO Take 1 tablet (300 mg total) by mouth daily.   meclizine 12.5 MG tablet Commonly known as: ANTIVERT TAKE 1 TABLET BY MOUTH THREE TIMES DAILY AS NEEDED FOR DIZZINESS   meloxicam 15 MG tablet Commonly known as: MOBIC TAKE 1 TABLET BY MOUTH ONCE DAILY AS NEEDED FOR PAIN What changed: See the new instructions.   naproxen sodium 220 MG tablet Commonly known as: ALEVE Take 440 mg by mouth 2 (two) times daily as needed (pain).   pantoprazole 40 MG tablet Commonly known as: PROTONIX Take 1 tablet (40 mg total) by mouth daily at 6 (six) AM. Start taking on: October 25, 2021   VITAMIN B 12 PO Take 1 tablet by mouth daily.   VITAMIN C PO Take 1 tablet by mouth daily.   Vitamin D3 125 MCG (5000 UT) Caps Take 5,000 Units by mouth daily.   ZINC PO Take 1 tablet by mouth daily.       Allergies  Allergen Reactions   Latex Itching    Follow-up Information     Bianca LevinsJohn, James W, MD. Schedule an appointment as soon as possible for a visit in 2 week(s).   Specialties: Internal Medicine, Radiology Contact information: [address] [place] [id number]  [phone]                  The results of significant diagnostics from this hospitalization (including imaging, microbiology, ancillary and laboratory) are listed below for reference.    Significant Diagnostic Studies: DG Abd 1 View  Result Date: 10/23/2021 CLINICAL DATA:  Follow-up small bowel obstruction. EXAM: ABDOMEN - 1 VIEW COMPARISON:  10/20/2021 and CT dated 10/19/2021. FINDINGS: There is residual contrast within a normal caliber colon. No small bowel dilation to suggest small bowel obstruction or significant adynamic ileus. IMPRESSION: 1. No acute findings. No evidence of small-bowel obstruction or significant adynamic ileus. Electronically Signed    By: Amie Portland M.D.   On: 10/23/2021 08:19   CT ABDOMEN PELVIS W CONTRAST  Result Date: 10/19/2021 CLINICAL DATA:  Evaluate for bowel obstruction. EXAM: CT ABDOMEN AND PELVIS WITH CONTRAST TECHNIQUE: Multidetector CT imaging of the abdomen and pelvis was performed using the standard protocol following bolus administration of intravenous contrast. CONTRAST:  87mL OMNIPAQUE IOHEXOL 350 MG/ML SOLN COMPARISON:  CT abdomen and pelvis 08/25/2018. FINDINGS: Lower chest: No acute abnormality. Hepatobiliary: No focal liver abnormality is seen. Status post cholecystectomy. No biliary dilatation. Pancreas: Unremarkable. No pancreatic ductal dilatation or surrounding inflammatory changes. Spleen: Normal in size without focal abnormality. Adrenals/Urinary Tract: Adrenal glands are unremarkable. Kidneys are normal, without renal calculi, focal lesion, or hydronephrosis. Bladder is unremarkable. Stomach/Bowel: There are fluid-filled mildly dilated central small bowel loops, many of which approximate the anterior abdominal wall. There are few tiny foci of extraluminal gas in the anterior abdomen adjacent to dilated small bowel loops image 2/49 and 50. There is no definitive wall thickening. Definitive transition point is not seen, but distal small bowel is decompressed in the pelvis. Colon and appendix are within normal limits. There is a small hiatal hernia. The stomach is otherwise within normal limits. Vascular/Lymphatic: No significant vascular findings are present. No enlarged abdominal or pelvic lymph nodes. Reproductive: Uterus and bilateral adnexa are unremarkable. Other: There is no ascites. There is scarring in the anterior abdominal wall. Musculoskeletal: No acute or significant osseous findings. IMPRESSION: Dilated central small bowel loops approximating the anterior abdominal wall worrisome for small bowel obstruction, likely secondary to adhesions. There is a small amount of extraluminal air suspicious for  micro perforation. Electronically Signed   By: Darliss Cheney M.D.   On: 10/19/2021 23:08   DG Abd 2 Views  Result Date: 10/20/2021 CLINICAL DATA:  Small bowel obstruction. EXAM: ABDOMEN - 2 VIEW COMPARISON:  Earlier same day FINDINGS: No gaseous small bowel dilatation to suggest overt obstruction. Gas-filled small bowel loop in the medial right abdomen shows 2.6 cm diameter. Colon is largely gasless is well. Contrast material in the urinary bladder is compatible with CT scan yesterday. NG tube tip is at or near the pylorus. IMPRESSION: No gas-filled dilated small bowel loops to suggest bowel obstruction. NG tube tip is positioned in the pyloric region of the stomach. Electronically Signed   By: Kennith Center M.D.   On: 10/20/2021 07:49   DG Abd Portable 1V-Small Bowel Obstruction Protocol-initial, 8 hr delay  Result Date: 10/20/2021 CLINICAL DATA:  Bowel obstruction EXAM: PORTABLE ABDOMEN - 1 VIEW COMPARISON:  10/20/2021 FINDINGS: Frontal view of the abdomen and pelvis excludes the pubic symphysis by collimation. Oral contrast has progressed throughout the colon by the time of the exam. No evidence of bowel obstruction. No masses or abnormal calcifications. Enteric catheter tip projects over the gastric antrum. IMPRESSION: 1. No evidence of  bowel obstruction. Oral contrast throughout the colon. 2. Enteric catheter tip projects over gastric antrum. Electronically Signed   By: Sharlet SalinaMichael  Brown M.D.   On: 10/20/2021 17:02   DG Abd Portable 1 View  Result Date: 10/20/2021 CLINICAL DATA:  NG tube placement. EXAM: PORTABLE ABDOMEN - 1 VIEW COMPARISON:  10/20/2021. FINDINGS: The bowel gas pattern is normal. An enteric tube terminates over the stomach. No abnormal calcification is seen. Surgical clips are present in the right upper quadrant. Excreted contrast is noted in the renal collecting systems. The cardiac silhouette is enlarged. IMPRESSION: The enteric tube terminates in the stomach. Electronically Signed    By: Thornell SartoriusLaura  Taylor M.D.   On: 10/20/2021 04:23   DG Abd Portable 1 View  Result Date: 10/20/2021 CLINICAL DATA:  NG tube placement. EXAM: PORTABLE ABDOMEN - 1 VIEW COMPARISON:  10/19/2021. FINDINGS: The bowel gas pattern is normal. An enteric tube terminates over the stomach. Excreted contrast is noted in the renal collecting systems bilaterally and urinary bladder. IMPRESSION: An enteric tube terminates over the stomach. Electronically Signed   By: Thornell SartoriusLaura  Taylor M.D.   On: 10/20/2021 01:42   US EKG SITE RITE  Result Date: 10/21/2021 If Site Rite image not attached, placement could not be confirmed due to current cardiac rhythm.   Microbiology: Recent Results (from the past 240 hour(s))  Resp Panel by RT-PCR (Flu A&B, Covid) Nasopharyngeal Swab     Status: None   Collection Time: 10/20/21  2:30 AM   Specimen: Nasopharyngeal Swab; Nasopharyngeal(NP) swabs in vial transport medium  Result Value Ref Range Status   SARS Coronavirus 2 by RT PCR NEGATIVE NEGATIVE Final    Comment: (NOTE) SARS-CoV-2 target nucleic acids are NOT DETECTED.  The SARS-CoV-2 RNA is generally detectable in upper respiratory specimens during the acute phase of infection. The lowest concentration of SARS-CoV-2 viral copies this assay can detect is 138 copies/mL. A negative result does not preclude SARS-Cov-2 infection and should not be used as the sole basis for treatment or other patient management decisions. A negative result may occur with  improper specimen collection/handling, submission of specimen other than nasopharyngeal swab, presence of viral mutation(s) within the areas targeted by this assay, and inadequate number of viral copies(<138 copies/mL). A negative result must be combined with clinical observations, patient history, and epidemiological information. The expected result is Negative.  Fact Sheet for Patients:  BloggerCourse.comhttps://www.fda.gov/media/152166/download  Fact Sheet for Healthcare Providers:   SeriousBroker.ithttps://www.fda.gov/media/152162/download  This test is no t yet approved or cleared by the [place] FDA and  has been authorized for detection and/or diagnosis of SARS-CoV-2 by FDA under an Emergency Use Authorization (EUA). This EUA will remain  in effect (meaning this test can be used) for the duration of the COVID-19 declaration under Section 564(b)(1) of the Act, 21 U.S.C.section 360bbb-3(b)(1), unless the authorization is terminated  or revoked sooner.       Influenza A by PCR NEGATIVE NEGATIVE Final   Influenza B by PCR NEGATIVE NEGATIVE Final    Comment: (NOTE) The Xpert Xpress SARS-CoV-2/FLU/RSV plus assay is intended as an aid in the diagnosis of influenza from Nasopharyngeal swab specimens and should not be used as a sole basis for treatment. Nasal washings and aspirates are unacceptable for Xpert Xpress SARS-CoV-2/FLU/RSV testing.  Fact Sheet for Patients: BloggerCourse.comhttps://www.fda.gov/media/152166/download  Fact Sheet for Healthcare Providers: SeriousBroker.ithttps://www.fda.gov/media/152162/download  This test is not yet approved or cleared by the [place] FDA and has been authorized for detection and/or diagnosis of SARS-CoV-2 by FDA  under an Emergency Use Authorization (EUA). This EUA will remain in effect (meaning this test can be used) for the duration of the COVID-19 declaration under Section 564(b)(1) of the Act, 21 U.S.C. section 360bbb-3(b)(1), unless the authorization is terminated or revoked.  Performed at [facility], [address], [place], [place] [address]      Labs: Basic Metabolic Panel: Recent Labs  Lab 10/19/21 2140 10/20/21 0550 10/21/21 0446 10/22/21 0435 10/24/21 0423  NA 137 138 138 139 138  K 4.3 3.5 4.0 3.9 3.4*  CL 105 104 107 106 105  CO2 23 26 24 22 24   GLUCOSE 184* 143* 109* 79 118*  BUN 11 8 6* 7* <5*  CREATININE 0.63 0.60 0.53 0.61 0.54  CALCIUM 9.7 8.9 8.4* 8.9 9.0  MG  --  2.0 1.8 2.0 1.6*   Liver Function  Tests: Recent Labs  Lab 10/19/21 2140 10/20/21 0550  AST 23 18  ALT 18 16  ALKPHOS 85 72  BILITOT 1.1 1.0  PROT 8.6* 7.8  ALBUMIN 4.4 3.7   Recent Labs  Lab 10/19/21 2140  LIPASE 24   No results for input(s): AMMONIA in the last 168 hours. CBC: Recent Labs  Lab 10/19/21 2140 10/20/21 0550 10/21/21 0446 10/22/21 0435  WBC 8.7 9.1 5.6 5.5  NEUTROABS 6.7 7.5 2.9  --   HGB 13.3 11.9* 11.1* 10.8*  HCT 41.6 37.6 34.8* 33.4*  MCV 82.9 83.2 82.9 83.5  PLT 336 302 259 236   Cardiac Enzymes: No results for input(s): CKTOTAL, CKMB, CKMBINDEX, TROPONINI in the last 168 hours. BNP: BNP (last 3 results) No results for input(s): BNP in the last 8760 hours.  ProBNP (last 3 results) No results for input(s): PROBNP in the last 8760 hours.  CBG: No results for input(s): GLUCAP in the last 168 hours.     Signed:  Ramiro Harvest MD.  [facility] 10/24/2021, 3:03 PM

## 2021-10-24 NOTE — Progress Notes (Signed)
Pt informed of d/c. Shared that she will call her husband. She shared that he is at a funeral. So it will be awhile before he can pick her up. AVS medication details discussed with  the and completed at the bedside. Cont with plan of care

## 2021-10-24 NOTE — Progress Notes (Signed)
° °  Subjective/Chief Complaint: Tolerating diet without nausea or vomiting Had another BM yesterday during the day   Objective: Vital signs in last 24 hours: Temp:  [97.9 F (36.6 C)-99.1 F (37.3 C)] 97.9 F (36.6 C) (01/08 0428) Pulse Rate:  [76-79] 76 (01/08 0428) Resp:  [16-18] 18 (01/08 0428) BP: (135-145)/(89-95) 135/89 (01/08 0428) SpO2:  [98 %-100 %] 100 % (01/08 0428) Last BM Date: 10/19/20  Intake/Output from previous day: 01/07 0701 - 01/08 0700 In: 2177.7 [P.O.:960; I.V.:1217.7] Out: -  Intake/Output this shift: No intake/output data recorded.  WDWN in NAD Abd - soft, non-distended, non-tender  Lab Results:  Recent Labs    10/22/21 0435  WBC 5.5  HGB 10.8*  HCT 33.4*  PLT 236   BMET Recent Labs    10/22/21 0435 10/24/21 0423  NA 139 138  K 3.9 3.4*  CL 106 105  CO2 22 24  GLUCOSE 79 118*  BUN 7* <5*  CREATININE 0.61 0.54  CALCIUM 8.9 9.0   PT/INR No results for input(s): LABPROT, INR in the last 72 hours. ABG No results for input(s): PHART, HCO3 in the last 72 hours.  Invalid input(s): PCO2, PO2  Studies/Results: DG Abd 1 View  Result Date: 10/23/2021 CLINICAL DATA:  Follow-up small bowel obstruction. EXAM: ABDOMEN - 1 VIEW COMPARISON:  10/20/2021 and CT dated 10/19/2021. FINDINGS: There is residual contrast within a normal caliber colon. No small bowel dilation to suggest small bowel obstruction or significant adynamic ileus. IMPRESSION: 1. No acute findings. No evidence of small-bowel obstruction or significant adynamic ileus. Electronically Signed   By: Amie Portland M.D.   On: 10/23/2021 08:19    Anti-infectives: Anti-infectives (From admission, onward)    Start     Dose/Rate Route Frequency Ordered Stop   10/20/21 0600  piperacillin-tazobactam (ZOSYN) IVPB 3.375 g  Status:  Discontinued        3.375 g 12.5 mL/hr over 240 Minutes Intravenous Every 8 hours 10/20/21 0153 10/23/21 0917   10/19/21 2345  piperacillin-tazobactam (ZOSYN)  IVPB 3.375 g        3.375 g 100 mL/hr over 30 Minutes Intravenous  Once 10/19/21 2342 10/20/21 0044       Assessment/Plan: SBO, recurrent, likely related to adhesions - resolved - Advance diet as tolerated - OK for discharge from surgical standpoint - we will sign off for now.  Call us back if needed     ID: [email or web address] - will discontinue abx as patient has no signs of intra-abdominal infection VTE: lovenox   Below per TRH --  GERD HTN Hyperglycemia   LOS: 4 days    Wynona Luna 10/24/2021

## 2021-10-24 NOTE — TOC CM/SW Note (Signed)
°  Transition of Care [facility]) Screening Note   Patient Details  Name: Bianca Campbell Date of Birth: [birth year]   Transition of Care [facility]) CM/SW Contact:    Darleene Cleaver, LCSW Phone Number: 10/24/2021, 4:23 PM    Transition of Care Department [facility]) has reviewed patient and no TOC needs have been identified at this time. We will continue to monitor patient advancement through interdisciplinary progression rounds. If new patient transition needs arise, please place a TOC consult.

## 2021-10-25 ENCOUNTER — Telehealth: Payer: Self-pay | Admitting: Internal Medicine

## 2021-10-25 ENCOUNTER — Telehealth: Payer: Self-pay

## 2021-10-25 NOTE — Telephone Encounter (Signed)
1.Medication Requested: dicyclomine (BENTYL) 20 MG tablet  2. Pharmacy (Name, Street, [place]): [id number] - [place] ([place]), [place] - [address]  3. On Med List: yes   4. Last Visit with PCP: 07-13-2021  5. Next visit date with PCP: 11-01-2021   Agent: Please be advised that RX refills may take up to 3 business days. We ask that you follow-up with your pharmacy.

## 2021-10-25 NOTE — Telephone Encounter (Signed)
Transition Care Management Follow-up Telephone Call Date of discharge and from where: TCM DC [facility] 10-24-21 Dx: Small bowel obstruction  How have you been since you were released from the hospital? Better  Any questions or concerns? Yes  Items Reviewed: Did the pt receive and understand the discharge instructions provided? Yes  Medications obtained and verified? Yes  Other? No  Any new allergies since your discharge? No  Dietary orders reviewed? Yes Do you have support at home? Yes   Home Care and Equipment/Supplies: Were home health services ordered? no If so, what is the name of the agency? na  Has the agency set up a time to come to the patient's home? not applicable Were any new equipment or medical supplies ordered?  No What is the name of the medical supply agency? na Were you able to get the supplies/equipment? not applicable Do you have any questions related to the use of the equipment or supplies? No  Functional Questionnaire: (I = Independent and D = Dependent) ADLs: I  Bathing/Dressing- I  Meal Prep- I  Eating- I  Maintaining continence- I  Transferring/Ambulation- I  Managing Meds- I  Follow up appointments reviewed:  PCP Hospital f/u appt confirmed? Yes  Scheduled to see Dr Jonny Ruiz on 11-01-21 @ 140pm. Specialist [facility] f/u appt confirmed? No  . Are transportation arrangements needed? No  If their condition worsens, is the pt aware to call PCP or go to the Emergency Dept.? Yes Was the patient provided with contact information for the PCP's office or ED? Yes Was to pt encouraged to call back with questions or concerns? Yes

## 2021-10-26 MED ORDER — DICYCLOMINE HCL 20 MG PO TABS: ORAL_TABLET | ORAL | 0 refills | Status: DC

## 2021-11-01 ENCOUNTER — Other Ambulatory Visit: Payer: Self-pay

## 2021-11-01 ENCOUNTER — Ambulatory Visit: Payer: 59 | Admitting: Internal Medicine

## 2021-11-01 ENCOUNTER — Encounter: Payer: Self-pay | Admitting: Internal Medicine

## 2021-11-01 VITALS — BP 138/80 | HR 78 | Temp 98.2°F | Ht 59.5 in | Wt 216.0 lb

## 2021-11-01 DIAGNOSIS — I10 Essential (primary) hypertension: Secondary | ICD-10-CM

## 2021-11-01 DIAGNOSIS — K565 Intestinal adhesions [bands], unspecified as to partial versus complete obstruction: Secondary | ICD-10-CM | POA: Diagnosis not present

## 2021-11-01 DIAGNOSIS — R739 Hyperglycemia, unspecified: Secondary | ICD-10-CM | POA: Diagnosis not present

## 2021-11-01 LAB — CBC WITH DIFFERENTIAL/PLATELET
Basophils Absolute: 0 10*3/uL (ref 0.0–0.1)
Basophils Relative: 0.7 % (ref 0.0–3.0)
Eosinophils Absolute: 0.2 10*3/uL (ref 0.0–0.7)
Eosinophils Relative: 4.8 % (ref 0.0–5.0)
HCT: 39.1 % (ref 36.0–46.0)
Hemoglobin: 12.4 g/dL (ref 12.0–15.0)
Lymphocytes Relative: 51 % — ABNORMAL HIGH (ref 12.0–46.0)
Lymphs Abs: 2.4 10*3/uL (ref 0.7–4.0)
MCHC: 31.8 g/dL (ref 30.0–36.0)
MCV: 81.7 fl (ref 78.0–100.0)
Monocytes Absolute: 0.3 10*3/uL (ref 0.1–1.0)
Monocytes Relative: 7 % (ref 3.0–12.0)
Neutro Abs: 1.7 10*3/uL (ref 1.4–7.7)
Neutrophils Relative %: 36.5 % — ABNORMAL LOW (ref 43.0–77.0)
Platelets: 316 10*3/uL (ref 150.0–400.0)
RBC: 4.78 Mil/uL (ref 3.87–5.11)
RDW: 14.2 % (ref 11.5–15.5)
WBC: 4.7 10*3/uL (ref 4.0–10.5)

## 2021-11-01 LAB — BASIC METABOLIC PANEL
BUN: 10 mg/dL (ref 6–23)
CO2: 25 mEq/L (ref 19–32)
Calcium: 9.8 mg/dL (ref 8.4–10.5)
Chloride: 103 mEq/L (ref 96–112)
Creatinine, Ser: 0.57 mg/dL (ref 0.40–1.20)
GFR: 95.92 mL/min (ref >60.00)
Glucose, Bld: 86 mg/dL (ref 70–99)
Potassium: 3.9 mEq/L (ref 3.5–5.1)
Sodium: 138 mEq/L (ref 135–145)

## 2021-11-01 NOTE — Patient Instructions (Addendum)

## 2021-11-01 NOTE — Progress Notes (Signed)
Chief Complaint: post hospn Jan 3- 8 2023       HPI:  Bianca Campbell is a 66 y.o. female here with above, hospd with SBO resolved with conservative tx. Pt denies chest pain, increased sob or doe, wheezing, orthopnea, PND, increased LE swelling, palpitations, dizziness or syncope.  Denies worsening reflux, abd pain, dysphagia, n/v, bowel change or blood.   Pt denies polydipsia, polyuria, or new focal neuro s/s.   Pt denies fever, wt loss, night sweats, loss of appetite, or other constitutional symptoms  Father died with stroke recently at 28.    Transitional Care Management elements noted today: 1)  Date of D/C: as above 2)  Medication reconciliation:  done today at end visit 3)  Review of D/C summary or other information:  done today 4)  Review of need for f/u on pending diagnostic tests and treatments:  done today 5)  Review of need for Interaction with other providers who will assume or resume care of pt specific problems: done today - none needed 6)  Education of patient/family/guardian or caregiver: none present       Wt Readings from Last 3 Encounters:  11/01/21 216 lb (98 kg)  10/20/21 212 lb 11.9 oz (96.5 kg)  07/13/21 218 lb 12.8 oz (99.2 kg)   BP Readings from Last 3 Encounters:  11/01/21 138/80  10/24/21 121/83  07/13/21 124/90         Past Medical History:  Diagnosis Date   ALLERGIC RHINITIS 09/11/2007   Anemia, unspecified 08/31/2012   Arthritis    "knees" (11/23/2016)   BACK PAIN    EXTERNAL OTITIS 09/11/2007   GERD 06/06/2007   GOUT 11/14/2007   High cholesterol    HYPERTENSION 06/06/2007   INTERMITTENT VERTIGO 09/11/2007   Irritable bowel syndrome 06/06/2007   LOW BACK PAIN 06/06/2007   OSTEOARTHRITIS 06/06/2007   PONV (postoperative nausea and vomiting)    Primary localized osteoarthritis of right knee    SHOULDER PAIN, RIGHT 12/30/2008   Past Surgical History:  Procedure Laterality Date   42 HOUR [place] STUDY N/A 05/27/2019   Procedure: 24 HOUR [place] STUDY;   Surgeon: Jerene Bears, MD;  Location: [facility];  Service: Gastroenterology;  Laterality: N/A;   [place]     pt stated 2015 or 2016 unknown md   ESOPHAGEAL MANOMETRY N/A 05/27/2019   Procedure: ESOPHAGEAL MANOMETRY (EM) WITH IMPEDENCE STUDY;  Surgeon: Jerene Bears, MD;  Location: [facility];  Service: Gastroenterology;  Laterality: N/A;   EXPLORATORY LAPAROTOMY     bowel obstruction   INGUINAL HERNIA REPAIR Bilateral    numerous hernia repairs 1996 to 2011   JOINT REPLACEMENT     KNEE ARTHROSCOPY Bilateral 2013 - 11/11/2015   LAPAROSCOPIC CHOLECYSTECTOMY  1991   s/p incarcerated hernia and ovary cyst Jan 2011  2011   TOTAL KNEE ARTHROPLASTY Right 11/21/2016   Procedure: RIGHT TOTAL KNEE ARTHROPLASTY;  Surgeon: Elsie Saas, MD;  Location: [place];  Service: Orthopedics;  Laterality: Right;   UMBILICAL HERNIA REPAIR     numerous hernia repairs 1996 to 2011    reports that she has never smoked. She has never used smokeless tobacco. She reports that she does not drink alcohol and does not use drugs. family history includes Arthritis in her mother; Asthma in her daughter; Colon polyps (age of onset: 73) in her father; Diabetes in her mother; Healthy in  her brother, daughter, sister, and son; Seizures in her mother. Allergies  Allergen Reactions   Latex Itching   Current Outpatient Medications on File Prior to Visit  Medication Sig Dispense Refill   acetaminophen (TYLENOL) 500 MG tablet Take 1,000 mg by mouth every 6 (six) hours as needed for mild pain or headache.     amLODipine (NORVASC) 10 MG tablet Take 1 tablet (10 mg total) by mouth daily. 90 tablet 3   Ascorbic Acid (VITAMIN C PO) Take 1 tablet by mouth daily.     aspirin EC 81 MG tablet Take 81 mg by mouth daily. Swallow whole.     Cholecalciferol (VITAMIN D3) 5000 units CAPS Take 5,000 Units by mouth daily.     Cyanocobalamin (VITAMIN B 12 PO) Take 1 tablet  by mouth daily.     dicyclomine (BENTYL) 20 MG tablet TAKE ONE TABLET BY MOUTH THREE TIMES DAILY BEFORE MEAL(S) 90 tablet 0   irbesartan (AVAPRO) 300 MG tablet Take 1 tablet (300 mg total) by mouth daily. 90 tablet 3   meclizine (ANTIVERT) 12.5 MG tablet TAKE 1 TABLET BY MOUTH THREE TIMES DAILY AS NEEDED FOR DIZZINESS 30 tablet 0   meloxicam (MOBIC) 15 MG tablet TAKE 1 TABLET BY MOUTH ONCE DAILY AS NEEDED FOR PAIN (Patient taking differently: Take 15 mg by mouth daily as needed for pain.) 90 tablet 0   Multiple Vitamins-Minerals (ZINC PO) Take 1 tablet by mouth daily.     naproxen sodium (ALEVE) 220 MG tablet Take 440 mg by mouth 2 (two) times daily as needed (pain).     pantoprazole (PROTONIX) 40 MG tablet Take 1 tablet (40 mg total) by mouth daily at 6 (six) AM. 30 tablet 1   No current facility-administered medications on file prior to visit.        ROS:  All others reviewed and negative.  Objective        PE:  BP 138/80 (BP Location: Left Arm, Patient Position: Sitting, Cuff Size: Large)    Pulse 78    Temp 98.2 F (36.8 C) (Oral)    Ht 4' 11.5" (1.511 m)    Wt 216 lb (98 kg)    SpO2 99%    BMI 42.90 kg/m                 Constitutional: Pt appears in NAD               HENT: Head: NCAT.                Right Ear: External ear normal.                 Left Ear: External ear normal.                Eyes: . Pupils are equal, round, and reactive to light. Conjunctivae and EOM are normal               Nose: without d/c or deformity               Neck: Neck supple. Gross normal ROM               Cardiovascular: Normal rate and regular rhythm.                 Pulmonary/Chest: Effort normal and breath sounds without rales or wheezing.                Abd:  Soft, NT, ND, + BS,  no organomegaly               Neurological: Pt is alert. At baseline orientation, motor grossly intact               Skin: Skin is warm. No rashes, no other new lesions, LE edema - none               Psychiatric: Pt  behavior is normal without agitation   Micro: none  Cardiac tracings I have personally interpreted today:  none  Pertinent Radiological findings (summarize): none   Lab Results  Component Value Date   WBC 4.7 11/01/2021   HGB 12.4 11/01/2021   HCT 39.1 11/01/2021   PLT 316.0 11/01/2021   GLUCOSE 86 11/01/2021   CHOL 177 06/28/2021   TRIG 108.0 06/28/2021   HDL 72.70 06/28/2021   LDLDIRECT 114.4 03/13/2013   LDLCALC 83 06/28/2021   ALT 16 10/20/2021   AST 18 10/20/2021   NA 138 11/01/2021   K 3.9 11/01/2021   CL 103 11/01/2021   CREATININE 0.57 11/01/2021   BUN 10 11/01/2021   CO2 25 11/01/2021   TSH 2.05 06/28/2021   INR 1.06 11/10/2016   HGBA1C 6.7 (H) 10/20/2021   Assessment/Plan:  TAMIRA CULHANE is a 66 y.o. Black or African American [2] female with  has a past medical history of ALLERGIC RHINITIS (09/11/2007), Anemia, unspecified (08/31/2012), Arthritis, BACK PAIN, EXTERNAL OTITIS (09/11/2007), GERD (06/06/2007), GOUT (11/14/2007), High cholesterol, HYPERTENSION (06/06/2007), INTERMITTENT VERTIGO (09/11/2007), Irritable bowel syndrome (06/06/2007), LOW BACK PAIN (06/06/2007), OSTEOARTHRITIS (06/06/2007), PONV (postoperative nausea and vomiting), Primary localized osteoarthritis of right knee, and SHOULDER PAIN, RIGHT (12/30/2008).  Small bowel obstruction due to adhesions [facility]) Symptomatically resolved with conservative tx,  to f/u any worsening symptoms or concerns   Hyperglycemia Lab Results  Component Value Date   HGBA1C 6.7 (H) 10/20/2021   Mild uncontrolled, pt to continue current medical treatment  - diet and wt control   Essential hypertension BP Readings from Last 3 Encounters:  11/01/21 138/80  10/24/21 121/83  07/13/21 124/90   Stable, pt to continue medical treatment norvasc, avapro  Followup: Return in about 6 months (around 05/01/2022).  Cathlean Cower, MD 11/06/2021 7:31 PM [facility]

## 2021-11-06 ENCOUNTER — Encounter: Payer: Self-pay | Admitting: Internal Medicine

## 2021-11-06 NOTE — Assessment & Plan Note (Signed)
Lab Results  Component Value Date   HGBA1C 6.7 (H) 10/20/2021   Mild uncontrolled, pt to continue current medical treatment  - diet and wt control

## 2021-11-06 NOTE — Assessment & Plan Note (Signed)
Symptomatically resolved with conservative tx,  to f/u any worsening symptoms or concerns

## 2021-11-06 NOTE — Assessment & Plan Note (Signed)
BP Readings from Last 3 Encounters:  11/01/21 138/80  10/24/21 121/83  07/13/21 124/90   Stable, pt to continue medical treatment norvasc, avapro

## 2021-11-20 ENCOUNTER — Emergency Department (HOSPITAL_COMMUNITY)
Admission: EM | Admit: 2021-11-20 | Discharge: 2021-11-20 | Disposition: A | Discharge status: Home / Self Care | Payer: 59 | Attending: Emergency Medicine | Admitting: Emergency Medicine

## 2021-11-20 ENCOUNTER — Emergency Department (HOSPITAL_COMMUNITY): Payer: 59

## 2021-11-20 ENCOUNTER — Other Ambulatory Visit: Payer: Self-pay

## 2021-11-20 ENCOUNTER — Encounter (HOSPITAL_COMMUNITY): Payer: Self-pay

## 2021-11-20 DIAGNOSIS — W010XXA Fall on same level from slipping, tripping and stumbling without subsequent striking against object, initial encounter: Secondary | ICD-10-CM | POA: Diagnosis not present

## 2021-11-20 DIAGNOSIS — S4991XA Unspecified injury of right shoulder and upper arm, initial encounter: Secondary | ICD-10-CM | POA: Insufficient documentation

## 2021-11-20 DIAGNOSIS — Y92007 Garden or yard of unspecified non-institutional (private) residence as the place of occurrence of the external cause: Secondary | ICD-10-CM | POA: Insufficient documentation

## 2021-11-20 DIAGNOSIS — Z7982 Long term (current) use of aspirin: Secondary | ICD-10-CM | POA: Insufficient documentation

## 2021-11-20 DIAGNOSIS — M7989 Other specified soft tissue disorders: Secondary | ICD-10-CM | POA: Diagnosis not present

## 2021-11-20 DIAGNOSIS — M79601 Pain in right arm: Secondary | ICD-10-CM | POA: Diagnosis not present

## 2021-11-20 DIAGNOSIS — Z91040 Latex allergy status: Secondary | ICD-10-CM | POA: Insufficient documentation

## 2021-11-20 MED ORDER — OXYCODONE-ACETAMINOPHEN 5-325 MG PO TABS
1.0000 | ORAL_TABLET | Freq: Once | ORAL | Status: AC
  Administered 2021-11-20: 1 via ORAL
  Filled 2021-11-20: qty 1

## 2021-11-20 MED ORDER — IBUPROFEN 800 MG PO TABS: 800.0000 mg | ORAL_TABLET | Freq: Three times a day (TID) | ORAL | 0 refills | Status: DC

## 2021-11-20 MED ORDER — IPRATROPIUM-ALBUTEROL 0.5-2.5 (3) MG/3ML IN SOLN: 3.0000 mL | Freq: Once | RESPIRATORY_TRACT | Status: DC

## 2021-11-20 NOTE — ED Triage Notes (Signed)
Pt reports falling on her right side this morning and now endorses right arm pain and tingling. Denies hitting her head and denies taking blood thinners. Denies pain anywhere else.

## 2021-11-20 NOTE — Discharge Instructions (Signed)
Your x-rays today were negative for any fractures or dislocations, however given the way you injured your self and your physical exam, I am concerned that you may have tore something in your rotator cuff.  I have given you a sling here and prescribed you with some 800 mg ibuprofen that you can take every 6 hours as needed for pain.  If you have some breakthrough pain between those doses, you can take 1000 mg Tylenol.  For the next 48 hours try to ice the area to reduce inflammation.  Prop your arm up on a bed of pillows at night for support.  Fortunately, you state that you have an appointment already with Ortho two days from now, so hopefully they will order you an MRI then. Feel better soon!

## 2021-11-20 NOTE — ED Provider Notes (Signed)
[facility]-EMERGENCY DEPT Provider Note   CSN: [id number] Arrival date & time: 11/20/21  1733     History  Chief Complaint  Patient presents with   Bianca Campbell is a 66 y.o. female who presents to the ED for evaluation after a ground-level fall that occurred on her yard earlier today.  Patient states that she tripped over something in her yard and fell forward catching herself on her right hand.  She endorses pain and swelling of the lateral right hand, tenderness and soreness of the lateral humerus and pain of the right shoulder joint.  Pain is worst upon palpation and with movement.  She denies numbness or tingling.  She took 2 Aleve prior to coming to ED without significant relief.  Fall Pertinent negatives include no abdominal pain, no headaches and no shortness of breath.      Home Medications Prior to Admission medications   Medication Sig Start Date End Date Taking? Authorizing Provider  acetaminophen (TYLENOL) 500 MG tablet Take 1,000 mg by mouth every 6 (six) hours as needed for mild pain or headache.    [provider]  amLODipine (NORVASC) 10 MG tablet Take 1 tablet (10 mg total) by mouth daily. 06/24/20   [name], MD  Ascorbic Acid (VITAMIN C PO) Take 1 tablet by mouth daily.    [provider]  aspirin EC 81 MG tablet Take 81 mg by mouth daily. Swallow whole.    [provider]  Cholecalciferol (VITAMIN D3) 5000 units CAPS Take 5,000 Units by mouth daily.    [provider]  Cyanocobalamin (VITAMIN B 12 PO) Take 1 tablet by mouth daily.    [provider]  dicyclomine (BENTYL) 20 MG tablet TAKE ONE TABLET BY MOUTH THREE TIMES DAILY BEFORE MEAL(S) 10/26/21   [name], MD  irbesartan (AVAPRO) 300 MG tablet Take 1 tablet (300 mg total) by mouth daily. 06/24/20   [name], MD  meclizine (ANTIVERT) 12.5 MG tablet TAKE 1 TABLET BY MOUTH THREE TIMES DAILY AS NEEDED FOR DIZZINESS 01/09/20    [name], MD  meloxicam (MOBIC) 15 MG tablet TAKE 1 TABLET BY MOUTH ONCE DAILY AS NEEDED FOR PAIN Patient taking differently: Take 15 mg by mouth daily as needed for pain. 03/25/20   [name], MD  Multiple Vitamins-Minerals (ZINC PO) Take 1 tablet by mouth daily.    [provider]  naproxen sodium (ALEVE) 220 MG tablet Take 440 mg by mouth 2 (two) times daily as needed (pain).    [provider]  pantoprazole (PROTONIX) 40 MG tablet Take 1 tablet (40 mg total) by mouth daily at 6 (six) AM. 10/25/21   Rodolph Bong, MD      Allergies    Latex    Review of Systems   Review of Systems  Constitutional:  Negative for fever.  HENT: Negative.    Eyes: Negative.   Respiratory:  Negative for shortness of breath.   Cardiovascular: Negative.   Gastrointestinal:  Negative for abdominal pain and vomiting.  Endocrine: Negative.   Genitourinary: Negative.   Musculoskeletal:  Positive for arthralgias and myalgias.  Skin:  Negative for rash.  Neurological:  Negative for headaches.  All other systems reviewed and are negative.  Physical Exam Updated Vital Signs BP (!) 176/109 (BP Location: Left Arm)    Pulse 89    Temp 98.7 F (37.1 C) (Oral)    Resp 18  Ht 4' 11.5" (1.511 m)    Wt 96.2 kg    SpO2 98%    BMI 42.10 kg/m  Physical Exam Vitals and nursing note reviewed.  Constitutional:      General: She is not in acute distress.    Appearance: She is not ill-appearing.  HENT:     Head: Atraumatic.  Eyes:     Conjunctiva/sclera: Conjunctivae normal.  Cardiovascular:     Rate and Rhythm: Normal rate and regular rhythm.     Pulses: Normal pulses.     Heart sounds: No murmur heard. Pulmonary:     Effort: Pulmonary effort is normal. No respiratory distress.     Breath sounds: Normal breath sounds.  Abdominal:     General: Abdomen is flat. There is no distension.     Palpations: Abdomen is soft.     Tenderness: There is no abdominal tenderness.   Musculoskeletal:        General: Normal range of motion.     Cervical back: Normal range of motion.     Comments: Exquisite tenderness to palpation of the mid upper arm without signs of bruising or deformity.  Right shoulder with tenderness palpation at the [facility] joint.  ROM limited due to patient's pain.  Patient reportedly worse during shoulder adduction versus abduction.  There is some swelling of the right little finger.  2+ radial pulses.  Good finger to thumb opposition.  Sensation intact  Skin:    General: Skin is warm and dry.     Capillary Refill: Capillary refill takes less than 2 seconds.  Neurological:     General: No focal deficit present.     Mental Status: She is alert.  Psychiatric:        Mood and Affect: Mood normal.    ED Results / Procedures / Treatments   Labs (all labs ordered are listed, but only abnormal results are displayed) Labs Reviewed - No data to display  EKG None  Radiology No results found.  Procedures Procedures    Medications Ordered in ED Medications  oxyCODONE-acetaminophen (PERCOCET/ROXICET) 5-325 MG per tablet 1 tablet (has no administration in time range)    ED Course/ Medical Decision Making/ A&P                           Medical Decision Making Amount and/or Complexity of Data Reviewed Radiology: ordered.  Risk Prescription drug management.   History:  Per HPI Additional history obtained from husband This patient presents to the ED for concern of musculoskeletal injury following a ground-level fall This involves an extensive number of treatment options, and is a complaint that carries with it a high risk of complications and morbidity.   Differentials include fracture, sprain, contusion, dislocation   Initial impression:  Patient sustained a ground-level FOOSH injury several hours prior to evaluation.  She endorses pain and swelling of the right hand, right upper arm and right shoulder.  Significant tender to palpation on  exam.  Range of motion limited.  Sensation and circulation intact.  Will obtain x-rays of right shoulder, right humerus, and right hand.  Pain medication ordered in ED.  Patient is not on anticoagulants.  Imaging Studies ordered:  I ordered imaging studies including  X-rays of right shoulder, humerus and hand all negative for fractures I independently visualized and interpreted imaging and I agree with the radiologist interpretation. Decisions made regarding results are detailed in the ED course and/or initial impression section  above.   Medicines ordered and prescription drug management:  I ordered medication including: Percocet for pain   Reevaluation of the patient after these medicines showed that the patient improved I have reviewed the patients home medicines and have made adjustments as needed  Disposition:  After consideration of the diagnostic results, physical exam, history and the patients response to treatment feel that the patent would benefit from discharge with orthopedic follow-up.   Right shoulder injury: Patient's x-rays negative for fracture or dislocation.  I do have some concern that she may have torn her rotator cuff.  Fortunately, she already had an orthopedic appointment scheduled for 2 days from now for chronic right shoulder pain, so she can follow-up then about her newest injury.  Patient will likely need MRI to assess for tears.  We discussed rice method and patient was given sling. All questions asked and answered.  Discharged home in good condition.  Final Clinical Impression(s) / ED Diagnoses Final diagnoses:  Injury of right shoulder, initial encounter    Rx / DC Orders ED Discharge Orders     None         Delight OvensConklin, Jamiah Homeyer R, PA-C 11/20/21 2157    Lorre NickAllen, Anthony, MD 11/21/21 1406

## 2021-11-23 ENCOUNTER — Other Ambulatory Visit: Payer: Self-pay | Admitting: Orthopedic Surgery

## 2021-11-23 DIAGNOSIS — M75101 Unspecified rotator cuff tear or rupture of right shoulder, not specified as traumatic: Secondary | ICD-10-CM

## 2021-11-23 DIAGNOSIS — M25511 Pain in right shoulder: Secondary | ICD-10-CM

## 2021-12-03 ENCOUNTER — Other Ambulatory Visit: Payer: Self-pay

## 2021-12-03 ENCOUNTER — Ambulatory Visit
Admission: RE | Admit: 2021-12-03 | Discharge: 2021-12-03 | Disposition: A | Discharge status: Home / Self Care | Payer: 59 | Source: Ambulatory Visit | Attending: Orthopedic Surgery | Admitting: Orthopedic Surgery

## 2021-12-03 DIAGNOSIS — M75101 Unspecified rotator cuff tear or rupture of right shoulder, not specified as traumatic: Secondary | ICD-10-CM

## 2021-12-03 DIAGNOSIS — M25511 Pain in right shoulder: Secondary | ICD-10-CM

## 2021-12-14 ENCOUNTER — Other Ambulatory Visit: Payer: Self-pay | Admitting: Sports Medicine

## 2021-12-14 DIAGNOSIS — M5430 Sciatica, unspecified side: Secondary | ICD-10-CM

## 2021-12-18 ENCOUNTER — Other Ambulatory Visit: Payer: Self-pay | Admitting: Internal Medicine

## 2021-12-19 NOTE — Telephone Encounter (Signed)
Please refill as per office routine med refill policy (all routine meds to be refilled for 3 mo or monthly (per pt preference) up to one year from last visit, then month to month grace period for 3 mo, then further med refills will have to be denied) ? ?

## 2021-12-23 ENCOUNTER — Other Ambulatory Visit: Payer: Self-pay | Admitting: Internal Medicine

## 2021-12-23 NOTE — Telephone Encounter (Signed)
Please refill as per office routine med refill policy (all routine meds to be refilled for 3 mo or monthly (per pt preference) up to one year from last visit, then month to month grace period for 3 mo, then further med refills will have to be denied) ? ?

## 2021-12-25 ENCOUNTER — Encounter (HOSPITAL_COMMUNITY): Payer: Self-pay

## 2021-12-25 ENCOUNTER — Other Ambulatory Visit: Payer: Self-pay

## 2021-12-25 ENCOUNTER — Emergency Department (HOSPITAL_COMMUNITY)
Admission: EM | Admit: 2021-12-25 | Discharge: 2021-12-25 | Disposition: A | Discharge status: Home / Self Care | Payer: 59 | Attending: Emergency Medicine | Admitting: Emergency Medicine

## 2021-12-25 ENCOUNTER — Emergency Department (HOSPITAL_COMMUNITY): Payer: 59

## 2021-12-25 DIAGNOSIS — M546 Pain in thoracic spine: Secondary | ICD-10-CM | POA: Insufficient documentation

## 2021-12-25 DIAGNOSIS — Z7982 Long term (current) use of aspirin: Secondary | ICD-10-CM | POA: Insufficient documentation

## 2021-12-25 DIAGNOSIS — Z91040 Latex allergy status: Secondary | ICD-10-CM | POA: Insufficient documentation

## 2021-12-25 LAB — CBC WITH DIFFERENTIAL/PLATELET
Abs Immature Granulocytes: 0.02 10*3/uL (ref 0.00–0.07)
Basophils Absolute: 0.1 10*3/uL (ref 0.0–0.1)
Basophils Relative: 1 %
Eosinophils Absolute: 0.2 10*3/uL (ref 0.0–0.5)
Eosinophils Relative: 3 %
HCT: 36.1 % (ref 36.0–46.0)
Hemoglobin: 11.5 g/dL — ABNORMAL LOW (ref 12.0–15.0)
Immature Granulocytes: 0 %
Lymphocytes Relative: 23 %
Lymphs Abs: 1.9 10*3/uL (ref 0.7–4.0)
MCH: 27 pg (ref 26.0–34.0)
MCHC: 31.9 g/dL (ref 30.0–36.0)
MCV: 84.7 fL (ref 80.0–100.0)
Monocytes Absolute: 0.7 10*3/uL (ref 0.1–1.0)
Monocytes Relative: 9 %
Neutro Abs: 5.5 10*3/uL (ref 1.7–7.7)
Neutrophils Relative %: 64 %
Platelets: 301 10*3/uL (ref 150–400)
RBC: 4.26 MIL/uL (ref 3.87–5.11)
RDW: 13.2 % (ref 11.5–15.5)
WBC: 8.4 10*3/uL (ref 4.0–10.5)
nRBC: 0 % (ref 0.0–0.2)

## 2021-12-25 LAB — BASIC METABOLIC PANEL
Anion gap: 9 (ref 5–15)
BUN: 8 mg/dL (ref 8–23)
CO2: 26 mmol/L (ref 22–32)
Calcium: 9 mg/dL (ref 8.9–10.3)
Chloride: 102 mmol/L (ref 98–111)
Creatinine, Ser: 0.33 mg/dL — ABNORMAL LOW (ref 0.44–1.00)
GFR, Estimated: >60 mL/min (ref >60)
Glucose, Bld: 105 mg/dL — ABNORMAL HIGH (ref 70–99)
Potassium: 3.2 mmol/L — ABNORMAL LOW (ref 3.5–5.1)
Sodium: 137 mmol/L (ref 135–145)

## 2021-12-25 MED ORDER — NAPROXEN 500 MG PO TABS
500.0000 mg | ORAL_TABLET | Freq: Once | ORAL | Status: AC
  Administered 2021-12-25: 500 mg via ORAL
  Filled 2021-12-25: qty 1

## 2021-12-25 MED ORDER — KETOROLAC TROMETHAMINE 30 MG/ML IJ SOLN: 15.0000 mg | Freq: Once | INTRAMUSCULAR | Status: DC

## 2021-12-25 MED ORDER — TIZANIDINE HCL 2 MG PO CAPS: 2.0000 mg | ORAL_CAPSULE | Freq: Three times a day (TID) | ORAL | 0 refills | Status: DC | PRN

## 2021-12-25 MED ORDER — TIZANIDINE HCL 4 MG PO TABS
2.0000 mg | ORAL_TABLET | Freq: Once | ORAL | Status: AC
  Administered 2021-12-25: 2 mg via ORAL
  Filled 2021-12-25: qty 1

## 2021-12-25 MED ORDER — HYDROMORPHONE HCL 1 MG/ML IJ SOLN
1.0000 mg | Freq: Once | INTRAMUSCULAR | Status: AC
  Administered 2021-12-25: 1 mg via INTRAMUSCULAR
  Filled 2021-12-25: qty 1

## 2021-12-25 NOTE — ED Notes (Signed)
Attempted to get pt into gown and in the bed, but pt stated she was in pain and wanted to remain in the wheelchair. ?

## 2021-12-25 NOTE — ED Notes (Signed)
Pt given ginger ale per request

## 2021-12-25 NOTE — ED Notes (Signed)
Patient ambulatory to restroom with steady gait and stand by assistance ?

## 2021-12-25 NOTE — ED Notes (Signed)
Pt mild distress noted in wheelchair, a/ox4. Pt states she had rotator cuff repair Wednseday but today began having extreme pain near the spine on the right side of her posterior shoulder that is different from the surgical pain. Pain increases with movement and palpation. Surgical wounds appear unremarkable without sign of infection. Pt denies CP, SOB, chills/fever. +pmsc.  ?

## 2021-12-25 NOTE — Discharge Instructions (Addendum)
As discussed, your evaluation today has been largely reassuring.  But, it is important that you monitor your condition carefully, and do not hesitate to return to the ED if you develop new, or concerning changes in your condition. ? ?Otherwise, please follow-up with your physician for appropriate ongoing care. ? ?In addition to the previously prescribed narcotics and tonight's addition of muscle relaxants which should be taken judiciously, and with careful monitoring, preferably not at the same time as your oxycodone, please use topical medicated patches including the ingredients methyl salicylate and lidocaine ? ? ?

## 2021-12-25 NOTE — ED Triage Notes (Signed)
Pt arrived via POV, states pain in back under shoulder blade and below. States rotator cuff surgery last Wednesday.  ?

## 2021-12-25 NOTE — ED Provider Notes (Addendum)
? [facility]-EMERGENCY DEPT ?Provider Note ? ? ?CSN: [id number] ?Arrival date & time: 12/25/21  1604 ? ?  ? ?History ? ?Chief Complaint  ?Patient presents with  ? Back Pain  ? ? ?Bianca Campbell is a 66 y.o. female. ? ?HPI ?Patient presents with her husband who assists with the history.  She presents due to pain in the right upper back in the region between the paraspinal and scapular areas.  No new loss of sensation in her hand, no fever, chills, nausea, vomiting, chest pain, dyspnea.  History is notable for rotator cuff repair performed 4 days ago after a fall that occurred to complaints prior to that.  She notes that the surgery was uncomplicated as far as she was made aware, and recovery was uneventful until last night when she began having pain.  She has been taking Percocet, ibuprofen without relief. ?  ? ?Home Medications ?Prior to Admission medications   ?Medication Sig Start Date End Date Taking? Authorizing Provider  ?acetaminophen (TYLENOL) 500 MG tablet Take 1,000 mg by mouth every 6 (six) hours as needed for mild pain or headache.    [provider]  ?amLODipine (NORVASC) 10 MG tablet Take 1 tablet by mouth once daily 12/24/21   [name], MD  ?Ascorbic Acid (VITAMIN C PO) Take 1 tablet by mouth daily.    [provider]  ?aspirin EC 81 MG tablet Take 81 mg by mouth daily. Swallow whole.    [provider]  ?Cholecalciferol (VITAMIN D3) 5000 units CAPS Take 5,000 Units by mouth daily.    [provider]  ?Cyanocobalamin (VITAMIN B 12 PO) Take 1 tablet by mouth daily.    [provider]  ?dicyclomine (BENTYL) 20 MG tablet TAKE ONE TABLET BY MOUTH THREE TIMES DAILY BEFORE MEAL(S) 10/26/21   [name], MD  ?ibuprofen (ADVIL) 800 MG tablet Take 1 tablet (800 mg total) by mouth 3 (three) times daily. 11/20/21   Janell Quiet, PA-C  ?irbesartan (AVAPRO) 300 MG tablet Take 1 tablet by mouth once daily 12/20/21   [name], MD  ?meclizine  (ANTIVERT) 12.5 MG tablet TAKE 1 TABLET BY MOUTH THREE TIMES DAILY AS NEEDED FOR DIZZINESS 01/09/20   [name], MD  ?meloxicam (MOBIC) 15 MG tablet TAKE 1 TABLET BY MOUTH ONCE DAILY AS NEEDED FOR PAIN ?Patient taking differently: Take 15 mg by mouth daily as needed for pain. 03/25/20   [name], MD  ?Multiple Vitamins-Minerals (ZINC PO) Take 1 tablet by mouth daily.    [provider]  ?naproxen sodium (ALEVE) 220 MG tablet Take 440 mg by mouth 2 (two) times daily as needed (pain).    [provider]  ?pantoprazole (PROTONIX) 40 MG tablet Take 1 tablet (40 mg total) by mouth daily at 6 (six) AM. 10/25/21   Rodolph Bong, MD  ?   ? ?Allergies    ?Latex   ? ?Review of Systems   ?Review of Systems  ?Constitutional:   ?     Per HPI, otherwise negative  ?HENT:    ?     Per HPI, otherwise negative  ?Respiratory:    ?     Per HPI, otherwise negative  ?Cardiovascular:   ?     Per HPI, otherwise negative  ?Gastrointestinal:  Negative for vomiting.  ?Endocrine:  ?     Negative aside from HPI  ?Genitourinary:   ?     Neg aside from HPI   ?  Musculoskeletal:   ?     Per HPI, otherwise negative  ?Skin: Negative.   ?Neurological:  Negative for syncope.  ? ?Physical Exam ?Updated Vital Signs ?BP (!) 174/89 (BP Location: Left Arm)   Pulse 97   Temp 98.6 ?F (37 ?C) (Oral)   Resp 18   Ht 4' 11.5" (1.511 m)   Wt 98 kg   SpO2 98%   BMI 42.90 kg/m?  ?Physical Exam ?Vitals and nursing note reviewed.  ?Constitutional:   ?   General: She is not in acute distress. ?   Appearance: She is well-developed.  ?HENT:  ?   Head: Normocephalic and atraumatic.  ?Eyes:  ?   Conjunctiva/sclera: Conjunctivae normal.  ?Cardiovascular:  ?   Rate and Rhythm: Normal rate and regular rhythm.  ?Pulmonary:  ?   Effort: Pulmonary effort is normal. No respiratory distress.  ?   Breath sounds: Normal breath sounds. No stridor.  ?Abdominal:  ?   General: There is no distension.  ?Musculoskeletal:  ?   Comments: Elbow, wrist both  unremarkable, but range of motion negligible secondary to surgery.  ?Skin: ?   General: Skin is warm and dry.  ?   Comments: Right lateral superior shoulder with clean looking surgical incisions with Steri-Strips in place  ?Neurological:  ?   Mental Status: She is alert and oriented to person, place, and time.  ?   Cranial Nerves: No cranial nerve deficit.  ?Psychiatric:     ?   Mood and Affect: Mood normal.  ? ? ?ED Results / Procedures / Treatments   ?Labs ?(all labs ordered are listed, but only abnormal results are displayed) ?Labs Reviewed - No data to display ? ?EKG ?None ? ?Radiology ?No results found. ? ?Procedures ?Procedures  ? ? ?Medications Ordered in ED ?Medications  ?HYDROmorphone (DILAUDID) injection 1 mg (has no administration in time range)  ?ketorolac (TORADOL) 30 MG/ML injection 15 mg (has no administration in time range)  ?tiZANidine (ZANAFLEX) tablet 2 mg (has no administration in time range)  ? ? ?ED Course/ Medical Decision Making/ A&P ? This patient presents to the ED for concern of back and shoulder pain in the context of recent surgery, this involves an extensive number of treatment options, and is a complaint that carries with it a high risk of complications and morbidity.  The differential diagnosis includes postop swelling, infection thrombosis ? ? ?Co morbidities that complicate the patient evaluation ? ?Recent surgery ? ? ?Social Determinants of Health: ? ?None ? ? ?Additional history obtained: ? ?Additional history and/or information obtained from husband ?External records from outside source obtained and reviewed including history of recent illness ? ? ?After the initial evaluation, orders, including: X-ray, labs were initiated. ? ?Patient placed on Cardiac and Pulse-Oximetry Monitors. ? ?The patient was also maintained on pulse oximetry. The readings were typically 99% room air ? ?On repeat evaluation of the patient improved ? ?Lab Tests: ? ?I personally interpreted labs.  The  pertinent results include: Minimally abnormal potassium ? ?Imaging Studies ordered: ? ?I independently visualized and interpreted imaging which appropriate postsurgical changes, emphysema subcu, no fracture ?I agree with the radiologist interpretation ? ? ? ?Dispostion / Final MDM: ? ?After consideration of the diagnostic results and the patient's response to treatment, patient is appropriate for discharge.  She has improved substantially with analgesics provided here, with reassuring findings, reviewed, discussed, and after patient is demonstrated ambulatory capacity, with no hemodynamic instability, no distress, patient will follow-up with her orthopedist.  No evidence for neurovascular compromise, bacteremia, sepsis.   ? ?Final Clinical Impression(s) / ED Diagnoses ?Final diagnoses:  ?Acute right-sided thoracic back pain  ? ?  ?Gerhard Munch, MD ?12/25/21 2205 ? ?  ?Gerhard Munch, MD ?12/25/21 2250 ? ?

## 2021-12-25 NOTE — ED Notes (Signed)
Pt NAD, a/ox4. Pt and spouse verbalizes understanding of all DC and f/u instructions. All questions answered. Pt wheeled in wheelchair to lobby by spouse at DC ? ?

## 2021-12-28 ENCOUNTER — Other Ambulatory Visit: Payer: 59

## 2022-03-07 ENCOUNTER — Ambulatory Visit
Admission: EM | Admit: 2022-03-07 | Discharge: 2022-03-07 | Disposition: A | Discharge status: Home / Self Care | Payer: 59 | Attending: Family Medicine | Admitting: Family Medicine

## 2022-03-07 DIAGNOSIS — J069 Acute upper respiratory infection, unspecified: Secondary | ICD-10-CM

## 2022-03-07 MED ORDER — PROMETHAZINE-DM 6.25-15 MG/5ML PO SYRP: 5.0000 mL | ORAL_SOLUTION | Freq: Four times a day (QID) | ORAL | 0 refills | Status: DC | PRN

## 2022-03-07 MED ORDER — AMOXICILLIN-POT CLAVULANATE 875-125 MG PO TABS: 1.0000 | ORAL_TABLET | Freq: Two times a day (BID) | ORAL | 0 refills | Status: DC

## 2022-03-07 NOTE — ED Triage Notes (Addendum)
Patient presents to Urgent Care with complaints of cough, congestion and itch throat since last week. Patient reports she has been taking otc cold and cough medications.   At home covid test neg

## 2022-03-07 NOTE — ED Provider Notes (Signed)
[facility]    CSN: [id number] Arrival date & time: 03/07/22  1038      History   Chief Complaint Chief Complaint  Patient presents with   Cough    HPI Bianca Campbell is a 66 y.o. female.   Presenting today with over a week of progressively worsening cough, congestion, sinus pain and pressure, sore throat, chest tightness, wheezing.  Denies fever, chills, body aches, chest pain, shortness of breath, abdominal pain, nausea vomiting or diarrhea.  So far trying cold and congestion medications with mild temporary relief of symptoms.  Home COVID test has been negative.  No known sick contacts recently.  No significant history of seasonal allergies per patient.   Past Medical History:  Diagnosis Date   ALLERGIC RHINITIS 09/11/2007   Anemia, unspecified 08/31/2012   Arthritis    "knees" (11/23/2016)   BACK PAIN    EXTERNAL OTITIS 09/11/2007   GERD 06/06/2007   GOUT 11/14/2007   High cholesterol    HYPERTENSION 06/06/2007   INTERMITTENT VERTIGO 09/11/2007   Irritable bowel syndrome 06/06/2007   LOW BACK PAIN 06/06/2007   OSTEOARTHRITIS 06/06/2007   PONV (postoperative nausea and vomiting)    Primary localized osteoarthritis of right knee    SHOULDER PAIN, RIGHT 12/30/2008    Patient Active Problem List   Diagnosis Date Noted   Small bowel obstruction due to adhesions (HCC) 10/20/2021   Dysphagia 07/16/2021   Chronic cough    Left foot pain 06/25/2018   Vertigo of central origin 06/20/2018   Left ankle pain 06/20/2018   Palpitations 06/20/2018   Hyperglycemia 06/20/2018   Lateral epicondylitis, right elbow 09/18/2017   Hypokalemia 06/27/2017   SBO (small bowel obstruction) (HCC) 06/13/2017   MRSA carrier 12/13/2016   Thrush 12/13/2016   Primary localized osteoarthritis of right knee    Abdominal pain 01/15/2016   Orthostasis 09/15/2015   Pain of multiple sites 01/15/2015   Bunion, right foot 12/30/2014   Elevated total protein 09/09/2014   Hyperlipidemia  03/13/2013   Vitamin D deficiency 03/13/2013   Bilateral foot pain 08/31/2012   Anemia, unspecified 08/31/2012   Pain in both feet 07/10/2012   Medial epicondylitis of left elbow 07/10/2012   Medial epicondylitis of right elbow 07/10/2012   Left knee pain 07/10/2012   Chest pain 05/22/2012   Right shoulder pain 05/22/2012   Right foot strain 05/22/2012   Right knee pain 03/25/2012   Encounter for well adult exam with abnormal findings 06/24/2011   Gastroenteritis 06/24/2011   Hypercalcemia 06/24/2011   SHOULDER PAIN, RIGHT 12/30/2008   Gout 11/14/2007   Backache 11/14/2007   ALLERGIC RHINITIS 09/11/2007   INTERMITTENT VERTIGO 09/11/2007   Essential hypertension 06/06/2007   GERD without esophagitis 06/06/2007   Irritable bowel syndrome 06/06/2007   Osteoarthritis 06/06/2007   Low back pain 06/06/2007    Past Surgical History:  Procedure Laterality Date   82 HOUR PH STUDY N/A 05/27/2019   Procedure: 24 HOUR PH STUDY;  Surgeon: Beverley Fiedler, MD;  Location: [facility];  Service: Gastroenterology;  Laterality: N/A;   BILATERAL SALPINGOOPHORECTOMY     CESAREAN SECTION  1987, 1991   COLONOSCOPY     pt stated 2015 or 2016 unknown md   ESOPHAGEAL MANOMETRY N/A 05/27/2019   Procedure: ESOPHAGEAL MANOMETRY (EM) WITH IMPEDENCE STUDY;  Surgeon: Beverley Fiedler, MD;  Location: [facility];  Service: Gastroenterology;  Laterality: N/A;   EXPLORATORY LAPAROTOMY     bowel obstruction   INGUINAL HERNIA REPAIR Bilateral  numerous hernia repairs 1996 to 2011   JOINT REPLACEMENT     KNEE ARTHROSCOPY Bilateral 2013 - 11/11/2015   LAPAROSCOPIC CHOLECYSTECTOMY  1991   s/p incarcerated hernia and ovary cyst Jan 2011  2011   TOTAL KNEE ARTHROPLASTY Right 11/21/2016   Procedure: RIGHT TOTAL KNEE ARTHROPLASTY;  Surgeon: Salvatore Marvel, MD;  Location: [facility] OR;  Service: Orthopedics;  Laterality: Right;   UMBILICAL HERNIA REPAIR     numerous hernia repairs 1996 to 2011    OB History   No  obstetric history on file.      Home Medications    Prior to Admission medications   Medication Sig Start Date End Date Taking? Authorizing Provider  amoxicillin-clavulanate (AUGMENTIN) 875-125 MG tablet Take 1 tablet by mouth every 12 (twelve) hours. 03/07/22  Yes Particia Nearing, PA-C  promethazine-dextromethorphan (PROMETHAZINE-DM) 6.25-15 MG/5ML syrup Take 5 mLs by mouth 4 (four) times daily as needed for cough. 03/07/22  Yes Particia Nearing, PA-C  acetaminophen (TYLENOL) 500 MG tablet Take 1,000 mg by mouth every 6 (six) hours as needed for mild pain or headache.    [provider]  amLODipine (NORVASC) 10 MG tablet Take 1 tablet by mouth once daily 12/24/21   [name], MD  Ascorbic Acid (VITAMIN C PO) Take 1 tablet by mouth daily.    [provider]  aspirin EC 81 MG tablet Take 81 mg by mouth daily. Swallow whole.    [provider]  Cholecalciferol (VITAMIN D3) 5000 units CAPS Take 5,000 Units by mouth daily.    [provider]  Cyanocobalamin (VITAMIN B 12 PO) Take 1 tablet by mouth daily.    [provider]  dicyclomine (BENTYL) 20 MG tablet TAKE ONE TABLET BY MOUTH THREE TIMES DAILY BEFORE MEAL(S) 10/26/21   [name], MD  ibuprofen (ADVIL) 800 MG tablet Take 1 tablet (800 mg total) by mouth 3 (three) times daily. 11/20/21   Janell Quiet, PA-C  irbesartan (AVAPRO) 300 MG tablet Take 1 tablet by mouth once daily 12/20/21   [name], MD  meclizine (ANTIVERT) 12.5 MG tablet TAKE 1 TABLET BY MOUTH THREE TIMES DAILY AS NEEDED FOR DIZZINESS 01/09/20   [name], MD  meloxicam (MOBIC) 15 MG tablet TAKE 1 TABLET BY MOUTH ONCE DAILY AS NEEDED FOR PAIN Patient taking differently: Take 15 mg by mouth daily as needed for pain. 03/25/20   [name], MD  Multiple Vitamins-Minerals (ZINC PO) Take 1 tablet by mouth daily.    [provider]  naproxen sodium (ALEVE) 220 MG tablet Take 440 mg by mouth 2 (two) times  daily as needed (pain).    [provider]  pantoprazole (PROTONIX) 40 MG tablet Take 1 tablet (40 mg total) by mouth daily at 6 (six) AM. 10/25/21   Rodolph Bong, MD  tizanidine (ZANAFLEX) 2 MG capsule Take 1 capsule (2 mg total) by mouth 3 (three) times daily as needed for muscle spasms. 12/25/21   Gerhard Munch, MD    Family History Family History  Problem Relation Age of Onset   Arthritis Mother        RA   Diabetes Mother    Seizures Mother    Colon polyps Father 52   Healthy Sister    Healthy Brother    Healthy Son    Healthy Daughter    Asthma Daughter    Colon cancer Neg Hx    Esophageal cancer Neg Hx  Rectal cancer Neg Hx    Stomach cancer Neg Hx     Social History Social History   Tobacco Use   Smoking status: Never   Smokeless tobacco: Never  Vaping Use   Vaping Use: Never used  Substance Use Topics   Alcohol use: No   Drug use: No     Allergies   Latex   Review of Systems Review of Systems Per HPI  Physical Exam Triage Vital Signs ED Triage Vitals  Enc Vitals Group     BP 03/07/22 1058 (!) 174/109     Pulse Rate 03/07/22 1058 86     Resp 03/07/22 1058 18     Temp 03/07/22 1058 97.8 F (36.6 C)     Temp src --      SpO2 03/07/22 1058 96 %     Weight --      Height --      Head Circumference --      Peak Flow --      Pain Score 03/07/22 1057 0     Pain Loc --      Pain Edu? --      Excl. in GC? --    No data found.  Updated Vital Signs BP (!) 162/96 (BP Location: Right Arm)   Pulse 86   Temp 97.8 F (36.6 C)   Resp 18   SpO2 96%   Visual Acuity Right Eye Distance:   Left Eye Distance:   Bilateral Distance:    Right Eye Near:   Left Eye Near:    Bilateral Near:     Physical Exam Vitals and nursing note reviewed.  Constitutional:      Appearance: Normal appearance.  HENT:     Head: Atraumatic.     Right Ear: Tympanic membrane and external ear normal.     Left Ear: Tympanic membrane and external ear  normal.     Nose: Congestion present.     Mouth/Throat:     Mouth: Mucous membranes are moist.     Pharynx: Posterior oropharyngeal erythema present.  Eyes:     Extraocular Movements: Extraocular movements intact.     Conjunctiva/sclera: Conjunctivae normal.  Cardiovascular:     Rate and Rhythm: Normal rate and regular rhythm.     Heart sounds: Normal heart sounds.  Pulmonary:     Effort: Pulmonary effort is normal.     Breath sounds: Normal breath sounds. No wheezing or rales.  Musculoskeletal:        General: Normal range of motion.     Cervical back: Normal range of motion and neck supple.  Skin:    General: Skin is warm and dry.  Neurological:     Mental Status: She is alert and oriented to person, place, and time.  Psychiatric:        Mood and Affect: Mood normal.        Thought Content: Thought content normal.     UC Treatments / Results  Labs (all labs ordered are listed, but only abnormal results are displayed) Labs Reviewed - No data to display  EKG   Radiology No results found.  Procedures Procedures (including critical care time)  Medications Ordered in UC Medications - No data to display  Initial Impression / Assessment and Plan / UC Course  I have reviewed the triage vital signs and the nursing notes.  Pertinent labs & imaging results that were available during my care of the patient were reviewed by me and considered  in my medical decision making (see chart for details).     Given duration and worsening course, treat with Augmentin, Phenergan DM, start allergy regimen and sinus rinses.  Return for acutely worsening symptoms.  Declines viral retesting which is reasonable given duration of symptoms.  Final Clinical Impressions(s) / UC Diagnoses   Final diagnoses:  Upper respiratory tract infection, unspecified type   Discharge Instructions   None    ED Prescriptions     Medication Sig Dispense Auth. Provider   amoxicillin-clavulanate  (AUGMENTIN) 875-125 MG tablet Take 1 tablet by mouth every 12 (twelve) hours. 14 tablet Particia NearingLane, Jentzen Minasyan [place], [place]C   promethazine-dextromethorphan (PROMETHAZINE-DM) 6.25-15 MG/5ML syrup Take 5 mLs by mouth 4 (four) times daily as needed for cough. 100 mL Particia NearingLane, Vallen Calabrese [place], [place]-C      PDMP not reviewed this encounter.   Particia NearingLane, Yaresly Menzel [place], [place] 03/07/22 1133

## 2022-05-02 ENCOUNTER — Encounter: Payer: Self-pay | Admitting: Internal Medicine

## 2022-05-02 ENCOUNTER — Ambulatory Visit (INDEPENDENT_AMBULATORY_CARE_PROVIDER_SITE_OTHER): Payer: Medicare Other | Admitting: Internal Medicine

## 2022-05-02 VITALS — BP 140/78 | HR 74 | Temp 98.0°F | Ht 59.5 in | Wt 227.0 lb

## 2022-05-02 DIAGNOSIS — M545 Low back pain, unspecified: Secondary | ICD-10-CM

## 2022-05-02 DIAGNOSIS — E7800 Pure hypercholesterolemia, unspecified: Secondary | ICD-10-CM | POA: Diagnosis not present

## 2022-05-02 DIAGNOSIS — Z0000 Encounter for general adult medical examination without abnormal findings: Secondary | ICD-10-CM | POA: Diagnosis not present

## 2022-05-02 DIAGNOSIS — I10 Essential (primary) hypertension: Secondary | ICD-10-CM

## 2022-05-02 DIAGNOSIS — E559 Vitamin D deficiency, unspecified: Secondary | ICD-10-CM

## 2022-05-02 DIAGNOSIS — R739 Hyperglycemia, unspecified: Secondary | ICD-10-CM | POA: Diagnosis not present

## 2022-05-02 DIAGNOSIS — D649 Anemia, unspecified: Secondary | ICD-10-CM

## 2022-05-02 DIAGNOSIS — Z0001 Encounter for general adult medical examination with abnormal findings: Secondary | ICD-10-CM

## 2022-05-02 LAB — BASIC METABOLIC PANEL
BUN: 10 mg/dL (ref 6–23)
CO2: 28 mEq/L (ref 19–32)
Calcium: 9.5 mg/dL (ref 8.4–10.5)
Chloride: 101 mEq/L (ref 96–112)
Creatinine, Ser: 0.47 mg/dL (ref 0.40–1.20)
GFR: 100.14 mL/min (ref >60.00)
Glucose, Bld: 104 mg/dL — ABNORMAL HIGH (ref 70–99)
Potassium: 3.5 mEq/L (ref 3.5–5.1)
Sodium: 137 mEq/L (ref 135–145)

## 2022-05-02 LAB — URINALYSIS, ROUTINE W REFLEX MICROSCOPIC
Bilirubin Urine: NEGATIVE
Hgb urine dipstick: NEGATIVE
Ketones, ur: NEGATIVE
Leukocytes,Ua: NEGATIVE
Nitrite: NEGATIVE
RBC / HPF: NONE SEEN (ref 0–?)
Specific Gravity, Urine: 1.015 (ref 1.000–1.030)
Total Protein, Urine: NEGATIVE
Urine Glucose: NEGATIVE
Urobilinogen, UA: 0.2 (ref 0.0–1.0)
pH: 6.5 (ref 5.0–8.0)

## 2022-05-02 LAB — CBC WITH DIFFERENTIAL/PLATELET
Basophils Absolute: 0 10*3/uL (ref 0.0–0.1)
Basophils Relative: 0.8 % (ref 0.0–3.0)
Eosinophils Absolute: 0.2 10*3/uL (ref 0.0–0.7)
Eosinophils Relative: 3.1 % (ref 0.0–5.0)
HCT: 36 % (ref 36.0–46.0)
Hemoglobin: 11.6 g/dL — ABNORMAL LOW (ref 12.0–15.0)
Lymphocytes Relative: 42.1 % (ref 12.0–46.0)
Lymphs Abs: 2.2 10*3/uL (ref 0.7–4.0)
MCHC: 32.3 g/dL (ref 30.0–36.0)
MCV: 80.2 fl (ref 78.0–100.0)
Monocytes Absolute: 0.4 10*3/uL (ref 0.1–1.0)
Monocytes Relative: 8.1 % (ref 3.0–12.0)
Neutro Abs: 2.5 10*3/uL (ref 1.4–7.7)
Neutrophils Relative %: 45.9 % (ref 43.0–77.0)
Platelets: 292 10*3/uL (ref 150.0–400.0)
RBC: 4.49 Mil/uL (ref 3.87–5.11)
RDW: 14.3 % (ref 11.5–15.5)
WBC: 5.3 10*3/uL (ref 4.0–10.5)

## 2022-05-02 LAB — LIPID PANEL
Cholesterol: 189 mg/dL (ref 0–200)
HDL: 80.2 mg/dL (ref >39.00)
LDL Cholesterol: 96 mg/dL (ref 0–99)
NonHDL: 108.46
Total CHOL/HDL Ratio: 2
Triglycerides: 62 mg/dL (ref 0.0–149.0)
VLDL: 12.4 mg/dL (ref 0.0–40.0)

## 2022-05-02 LAB — IBC PANEL
Iron: 67 ug/dL (ref 42–145)
Saturation Ratios: 15.3 % — ABNORMAL LOW (ref 20.0–50.0)
TIBC: 438.2 ug/dL (ref 250.0–450.0)
Transferrin: 313 mg/dL (ref 212.0–360.0)

## 2022-05-02 LAB — HEMOGLOBIN A1C: Hgb A1c MFr Bld: 6.9 % — ABNORMAL HIGH (ref 4.6–6.5)

## 2022-05-02 LAB — HEPATIC FUNCTION PANEL
ALT: 14 U/L (ref 0–35)
AST: 20 U/L (ref 0–37)
Albumin: 4.4 g/dL (ref 3.5–5.2)
Alkaline Phosphatase: 93 U/L (ref 39–117)
Bilirubin, Direct: 0.1 mg/dL (ref 0.0–0.3)
Total Bilirubin: 0.6 mg/dL (ref 0.2–1.2)
Total Protein: 8.1 g/dL (ref 6.0–8.3)

## 2022-05-02 LAB — VITAMIN D 25 HYDROXY (VIT D DEFICIENCY, FRACTURES): VITD: 27.86 ng/mL — ABNORMAL LOW (ref 30.00–100.00)

## 2022-05-02 LAB — FERRITIN: Ferritin: 39.9 ng/mL (ref 10.0–291.0)

## 2022-05-02 LAB — TSH: TSH: 2.29 u[IU]/mL (ref 0.35–5.50)

## 2022-05-02 MED ORDER — CYCLOBENZAPRINE HCL 5 MG PO TABS: 5.0000 mg | ORAL_TABLET | Freq: Three times a day (TID) | ORAL | 2 refills | Status: DC | PRN

## 2022-05-02 NOTE — Progress Notes (Unsigned)
Patient ID: Bianca Campbell, female   DOB: [birth year], 66 y.o.   MRN: [id number]         Chief Complaint:: wellness exam and Follow-up (6 month f/u)  Right low back pain,        HPI:  Bianca Campbell is a 66 y.o. female here for wellness exam                        Also fell needed right rot cuff surgury in mar 2023 now in PT and improving.  Pt continues to have recurring right LBP without bowel or bladder change, fever, wt loss,  worsening LE pain/numbness/weakness, gait change or falls, has plans to f/u with [facility]. Also to Dr Madelon Lips for the right shoulder.     Wt Readings from Last 3 Encounters:  05/02/22 227 lb (103 kg)  12/25/21 216 lb (98 kg)  11/20/21 212 lb (96.2 kg)   BP Readings from Last 3 Encounters:  05/02/22 140/78  03/07/22 (!) 162/96  12/25/21 (!) 144/78   Immunization History  Administered Date(s) Administered   Influenza,inj,Quad PF,6+ Mos 09/15/2015, 08/27/2018   PFIZER(Purple Top)SARS-COV-2 Vaccination 12/24/2019, 01/14/2020   Td 02/24/2010   Tdap 06/24/2020   Health Maintenance Due  Topic Date Due   Zoster Vaccines- Shingrix (1 of 2) Never done   Pneumonia Vaccine 19+ Years old (1 - PCV) Never done   DEXA SCAN  Never done   PAP SMEAR-Modifier  06/09/2022      Past Medical History:  Diagnosis Date   ALLERGIC RHINITIS 09/11/2007   Anemia, unspecified 08/31/2012   Arthritis    "knees" (11/23/2016)   BACK PAIN    EXTERNAL OTITIS 09/11/2007   GERD 06/06/2007   GOUT 11/14/2007   High cholesterol    HYPERTENSION 06/06/2007   INTERMITTENT VERTIGO 09/11/2007   Irritable bowel syndrome 06/06/2007   LOW BACK PAIN 06/06/2007   OSTEOARTHRITIS 06/06/2007   PONV (postoperative nausea and vomiting)    Primary localized osteoarthritis of right knee    SHOULDER PAIN, RIGHT 12/30/2008   Past Surgical History:  Procedure Laterality Date   59 HOUR PH STUDY N/A 05/27/2019   Procedure: 24 HOUR PH STUDY;  Surgeon: Beverley Fiedler, MD;  Location: [facility];  Service: Gastroenterology;  Laterality: N/A;   BILATERAL SALPINGOOPHORECTOMY     CESAREAN SECTION  1987, 1991   COLONOSCOPY     pt stated 2015 or 2016 unknown md   ESOPHAGEAL MANOMETRY N/A 05/27/2019   Procedure: ESOPHAGEAL MANOMETRY (EM) WITH IMPEDENCE STUDY;  Surgeon: Beverley Fiedler, MD;  Location: [facility];  Service: Gastroenterology;  Laterality: N/A;   EXPLORATORY LAPAROTOMY     bowel obstruction   INGUINAL HERNIA REPAIR Bilateral    numerous hernia repairs 1996 to 2011   JOINT REPLACEMENT     KNEE ARTHROSCOPY Bilateral 2013 - 11/11/2015   LAPAROSCOPIC CHOLECYSTECTOMY  1991   s/p incarcerated hernia and ovary cyst Jan 2011  2011   TOTAL KNEE ARTHROPLASTY Right 11/21/2016   Procedure: RIGHT TOTAL KNEE ARTHROPLASTY;  Surgeon: Salvatore Marvel, MD;  Location: [facility] OR;  Service: Orthopedics;  Laterality: Right;   UMBILICAL HERNIA REPAIR     numerous hernia repairs 1996 to 2011    reports that she has never smoked. She has never used smokeless tobacco. She reports that she does not drink alcohol and does not use drugs. family history includes Arthritis in her mother; Asthma in her daughter; Colon polyps (age  of onset: 18) in her father; Diabetes in her mother; Healthy in her brother, daughter, sister, and son; Seizures in her mother. Allergies  Allergen Reactions   Latex Itching   Current Outpatient Medications on File Prior to Visit  Medication Sig Dispense Refill   acetaminophen (TYLENOL) 500 MG tablet Take 1,000 mg by mouth every 6 (six) hours as needed for mild pain or headache.     amLODipine (NORVASC) 10 MG tablet Take 1 tablet by mouth once daily 90 tablet 0   amoxicillin-clavulanate (AUGMENTIN) 875-125 MG tablet Take 1 tablet by mouth every 12 (twelve) hours. 14 tablet 0   Ascorbic Acid (VITAMIN C PO) Take 1 tablet by mouth daily.     aspirin EC 81 MG tablet Take 81 mg by mouth daily. Swallow whole.     Cholecalciferol (VITAMIN D3) 5000 units CAPS Take 5,000 Units by  mouth daily.     Cyanocobalamin (VITAMIN B 12 PO) Take 1 tablet by mouth daily.     dicyclomine (BENTYL) 20 MG tablet TAKE ONE TABLET BY MOUTH THREE TIMES DAILY BEFORE MEAL(S) 90 tablet 0   ibuprofen (ADVIL) 800 MG tablet Take 1 tablet (800 mg total) by mouth 3 (three) times daily. 30 tablet 0   irbesartan (AVAPRO) 300 MG tablet Take 1 tablet by mouth once daily 90 tablet 0   meclizine (ANTIVERT) 12.5 MG tablet TAKE 1 TABLET BY MOUTH THREE TIMES DAILY AS NEEDED FOR DIZZINESS 30 tablet 0   meloxicam (MOBIC) 15 MG tablet TAKE 1 TABLET BY MOUTH ONCE DAILY AS NEEDED FOR PAIN (Patient taking differently: Take 15 mg by mouth daily as needed for pain.) 90 tablet 0   Multiple Vitamins-Minerals (ZINC PO) Take 1 tablet by mouth daily.     naproxen sodium (ALEVE) 220 MG tablet Take 440 mg by mouth 2 (two) times daily as needed (pain).     pantoprazole (PROTONIX) 40 MG tablet Take 1 tablet (40 mg total) by mouth daily at 6 (six) AM. 30 tablet 1   promethazine-dextromethorphan (PROMETHAZINE-DM) 6.25-15 MG/5ML syrup Take 5 mLs by mouth 4 (four) times daily as needed for cough. 100 mL 0   tizanidine (ZANAFLEX) 2 MG capsule Take 1 capsule (2 mg total) by mouth 3 (three) times daily as needed for muscle spasms. 21 capsule 0   No current facility-administered medications on file prior to visit.        ROS:  All others reviewed and negative.  Objective        PE:  BP 140/78 (BP Location: Left Arm, Patient Position: Sitting, Cuff Size: Large)   Pulse 74   Temp 98 F (36.7 C) (Oral)   Ht 4' 11.5" (1.511 m)   Wt 227 lb (103 kg)   SpO2 100%   BMI 45.08 kg/m                 Constitutional: Pt appears in NAD               HENT: Head: NCAT.                Right Ear: External ear normal.                 Left Ear: External ear normal.                Eyes: . Pupils are equal, round, and reactive to light. Conjunctivae and EOM are normal  Nose: without d/c or deformity               Neck: Neck  supple. Gross normal ROM               Cardiovascular: Normal rate and regular rhythm.                 Pulmonary/Chest: Effort normal and breath sounds without rales or wheezing.                Abd:  Soft, NT, ND, + BS, no organomegaly               Neurological: Pt is alert. At baseline orientation, motor grossly intact               Skin: Skin is warm. No rashes, no other new lesions, LE edema - ***               Psychiatric: Pt behavior is normal without agitation   Micro: none  Cardiac tracings I have personally interpreted today:  none  Pertinent Radiological findings (summarize): none   Lab Results  Component Value Date   WBC 8.4 12/25/2021   HGB 11.5 (L) 12/25/2021   HCT 36.1 12/25/2021   PLT 301 12/25/2021   GLUCOSE 105 (H) 12/25/2021   CHOL 177 06/28/2021   TRIG 108.0 06/28/2021   HDL 72.70 06/28/2021   LDLDIRECT 114.4 03/13/2013   LDLCALC 83 06/28/2021   ALT 16 10/20/2021   AST 18 10/20/2021   NA 137 12/25/2021   K 3.2 (L) 12/25/2021   CL 102 12/25/2021   CREATININE 0.33 (L) 12/25/2021   BUN 8 12/25/2021   CO2 26 12/25/2021   TSH 2.05 06/28/2021   INR 1.06 11/10/2016   HGBA1C 6.7 (H) 10/20/2021   Assessment/Plan:  Bianca Campbell is a 66 y.o. Black or African American [2] female with  has a past medical history of ALLERGIC RHINITIS (09/11/2007), Anemia, unspecified (08/31/2012), Arthritis, BACK PAIN, EXTERNAL OTITIS (09/11/2007), GERD (06/06/2007), GOUT (11/14/2007), High cholesterol, HYPERTENSION (06/06/2007), INTERMITTENT VERTIGO (09/11/2007), Irritable bowel syndrome (06/06/2007), LOW BACK PAIN (06/06/2007), OSTEOARTHRITIS (06/06/2007), PONV (postoperative nausea and vomiting), Primary localized osteoarthritis of right knee, and SHOULDER PAIN, RIGHT (12/30/2008).  No problem-specific Assessment & Plan notes found for this encounter.  Followup: No follow-ups on file.  Oliver Barre, MD 05/02/2022 2:12 PM [facility] [place] [facility] Medicine

## 2022-05-02 NOTE — Patient Instructions (Signed)
Please take all new medication as prescribed - the muscle relaxer as needed  Please continue all other medications as before, and refills have been done if requested.  Please have the pharmacy call with any other refills you may need.  Please continue your efforts at being more active, low cholesterol diet, and weight control.  You are otherwise up to date with prevention measures today.  Please keep your appointments with your specialists as you may have planned  Please go to the LAB at the blood drawing area for the tests to be done  You will be contacted by phone if any changes need to be made immediately.  Otherwise, you will receive a letter about your results with an explanation, but please check with MyChart first.  Please remember to sign up for MyChart if you have not done so, as this will be important to you in the future with finding out test results, communicating by private email, and scheduling acute appointments online when needed.  Please make an Appointment to return in 6 months, or sooner if needed 

## 2022-05-03 NOTE — Assessment & Plan Note (Signed)
Age and sex appropriate education and counseling updated with regular exercise and diet Referrals for preventative services - declines dxa for now Immunizations addressed - declines shingrix, covid booster Smoking counseling  - none needed Evidence for depression or other mood disorder - none significant Most recent labs reviewed. I have personally reviewed and have noted: 1) the patient's medical and social history 2) The patient's current medications and supplements 3) The patient's height, weight, and BMI have been recorded in the chart

## 2022-05-03 NOTE — Assessment & Plan Note (Signed)
Now with right lumbar paravertebral tender spasm - for flexeril 5 tid prn,  to f/u any worsening symptoms or concerns

## 2022-05-03 NOTE — Assessment & Plan Note (Signed)
Lab Results  Component Value Date   LDLCALC 96 05/02/2022   Stable, pt to continue current low chol diet as declines statin for now

## 2022-05-03 NOTE — Assessment & Plan Note (Signed)
BP Readings from Last 3 Encounters:  05/02/22 140/78  03/07/22 (!) 162/96  12/25/21 (!) 144/78   Stable, pt to continue medical treatment amlodipine 10 qd, and avapro 300 qd

## 2022-05-03 NOTE — Assessment & Plan Note (Signed)
Last vitamin D Lab Results  Component Value Date   VD25OH 27.86 (L) 05/02/2022   Low, to start oral replacement

## 2022-05-03 NOTE — Assessment & Plan Note (Signed)
Lab Results  Component Value Date   HGBA1C 6.9 (H) 05/02/2022   Uncontrolled but <7, pt to continue current medical treatment  - diet, wt control, excercise

## 2022-05-03 NOTE — Assessment & Plan Note (Signed)
Lab Results  Component Value Date   WBC 5.3 05/02/2022   HGB 11.6 (L) 05/02/2022   HCT 36.0 05/02/2022   MCV 80.2 05/02/2022   PLT 292.0 05/02/2022  stable, cont current med tx,  to f/u any worsening symptoms or concerns

## 2022-05-30 ENCOUNTER — Other Ambulatory Visit: Payer: Self-pay | Admitting: Orthopedic Surgery

## 2022-05-30 DIAGNOSIS — M48061 Spinal stenosis, lumbar region without neurogenic claudication: Secondary | ICD-10-CM

## 2022-06-05 ENCOUNTER — Other Ambulatory Visit: Payer: Medicare Other

## 2022-06-06 ENCOUNTER — Ambulatory Visit
Admission: RE | Admit: 2022-06-06 | Discharge: 2022-06-06 | Disposition: A | Discharge status: Home / Self Care | Payer: Medicare Other | Source: Ambulatory Visit | Attending: Orthopedic Surgery | Admitting: Orthopedic Surgery

## 2022-06-06 DIAGNOSIS — M48061 Spinal stenosis, lumbar region without neurogenic claudication: Secondary | ICD-10-CM

## 2022-06-24 ENCOUNTER — Telehealth: Payer: Self-pay | Admitting: Internal Medicine

## 2022-06-24 MED ORDER — MECLIZINE HCL 12.5 MG PO TABS: ORAL_TABLET | ORAL | 5 refills | Status: DC

## 2022-06-24 NOTE — Telephone Encounter (Signed)
PT calls today in need of a refill of an old RX they had been prescribed before by Dr.John for dizziness. We were able to boil it down to meclizine (ANTIVERT) 12.5 MG. If it can be filled PT would like this sent to the [facility] on [address]   Royann Shivers if needed: [phone]

## 2022-06-24 NOTE — Telephone Encounter (Signed)
Last OV 05/02/22 Last prescribed 01/09/20

## 2022-06-24 NOTE — Telephone Encounter (Signed)
Ok done erx 

## 2022-06-29 ENCOUNTER — Encounter: Payer: Self-pay | Admitting: Emergency Medicine

## 2022-06-29 ENCOUNTER — Ambulatory Visit (INDEPENDENT_AMBULATORY_CARE_PROVIDER_SITE_OTHER): Payer: Medicare Other | Admitting: Emergency Medicine

## 2022-06-29 VITALS — BP 142/86 | HR 87 | Temp 98.3°F | Ht 59.5 in | Wt 228.5 lb

## 2022-06-29 DIAGNOSIS — R42 Dizziness and giddiness: Secondary | ICD-10-CM

## 2022-06-29 DIAGNOSIS — I10 Essential (primary) hypertension: Secondary | ICD-10-CM | POA: Diagnosis not present

## 2022-06-29 LAB — COMPREHENSIVE METABOLIC PANEL
ALT: 17 U/L (ref 0–35)
AST: 22 U/L (ref 0–37)
Albumin: 4.1 g/dL (ref 3.5–5.2)
Alkaline Phosphatase: 96 U/L (ref 39–117)
BUN: 10 mg/dL (ref 6–23)
CO2: 25 mEq/L (ref 19–32)
Calcium: 9.9 mg/dL (ref 8.4–10.5)
Chloride: 101 mEq/L (ref 96–112)
Creatinine, Ser: 0.55 mg/dL (ref 0.40–1.20)
GFR: 96.31 mL/min (ref >60.00)
Glucose, Bld: 109 mg/dL — ABNORMAL HIGH (ref 70–99)
Potassium: 3.8 mEq/L (ref 3.5–5.1)
Sodium: 136 mEq/L (ref 135–145)
Total Bilirubin: 0.7 mg/dL (ref 0.2–1.2)
Total Protein: 8.5 g/dL — ABNORMAL HIGH (ref 6.0–8.3)

## 2022-06-29 LAB — CBC WITH DIFFERENTIAL/PLATELET
Basophils Absolute: 0.1 10*3/uL (ref 0.0–0.1)
Basophils Relative: 1.1 % (ref 0.0–3.0)
Eosinophils Absolute: 0.4 10*3/uL (ref 0.0–0.7)
Eosinophils Relative: 6.7 % — ABNORMAL HIGH (ref 0.0–5.0)
HCT: 39.3 % (ref 36.0–46.0)
Hemoglobin: 12.8 g/dL (ref 12.0–15.0)
Lymphocytes Relative: 42.1 % (ref 12.0–46.0)
Lymphs Abs: 2.3 10*3/uL (ref 0.7–4.0)
MCHC: 32.4 g/dL (ref 30.0–36.0)
MCV: 81.1 fl (ref 78.0–100.0)
Monocytes Absolute: 0.4 10*3/uL (ref 0.1–1.0)
Monocytes Relative: 6.9 % (ref 3.0–12.0)
Neutro Abs: 2.4 10*3/uL (ref 1.4–7.7)
Neutrophils Relative %: 43.2 % (ref 43.0–77.0)
Platelets: 313 10*3/uL (ref 150.0–400.0)
RBC: 4.85 Mil/uL (ref 3.87–5.11)
RDW: 14.1 % (ref 11.5–15.5)
WBC: 5.5 10*3/uL (ref 4.0–10.5)

## 2022-06-29 MED ORDER — MECLIZINE HCL 25 MG PO TABS: 25.0000 mg | ORAL_TABLET | Freq: Three times a day (TID) | ORAL | 0 refills | Status: DC | PRN

## 2022-06-29 NOTE — Assessment & Plan Note (Signed)
Acute exacerbation of chronic condition. Differential diagnosis discussed No red flag signs or symptoms. Benign examination.  No findings of a stroke. Recommend meclizine 25 mg 3 times daily as needed. Blood work done today. ED precautions given Advised to contact the office if no better or worse during the next several days.

## 2022-06-29 NOTE — Progress Notes (Signed)
Bianca Campbell 66 y.o.   Chief Complaint  Patient presents with   possible vertigo    Hx of vertigo,  started last Thursday     HISTORY OF PRESENT ILLNESS: Acute problem visit today.  Patient of Dr. Oliver Barre This is a 66 y.o. female with history of chronic vertigo complaining of another typical attack that started last Thursday.  Had mild nausea in the beginning but not anymore.  Denies vomiting.  Denies headache.  Denies visual disturbances.  Balance not greatly affected.  Denies chest pain or difficulty breathing.  Denies abdominal pain or diarrhea.  Denies fever or flulike symptoms.  No new medications. No other complaints or medical concerns today.  HPI   Prior to Admission medications   Medication Sig Start Date End Date Taking? Authorizing Provider  acetaminophen (TYLENOL) 500 MG tablet Take 1,000 mg by mouth every 6 (six) hours as needed for mild pain or headache.   Yes [provider]  amLODipine (NORVASC) 10 MG tablet Take 1 tablet by mouth once daily 12/24/21  Yes [name], MD  amoxicillin-clavulanate (AUGMENTIN) 875-125 MG tablet Take 1 tablet by mouth every 12 (twelve) hours. 03/07/22  Yes Particia Nearing, PA-C  Ascorbic Acid (VITAMIN C PO) Take 1 tablet by mouth daily.   Yes [provider]  aspirin EC 81 MG tablet Take 81 mg by mouth daily. Swallow whole.   Yes [provider]  Cholecalciferol (VITAMIN D3) 5000 units CAPS Take 5,000 Units by mouth daily.   Yes [provider]  Cyanocobalamin (VITAMIN B 12 PO) Take 1 tablet by mouth daily.   Yes [provider]  cyclobenzaprine (FLEXERIL) 5 MG tablet Take 1 tablet (5 mg total) by mouth 3 (three) times daily as needed. 05/02/22  Yes [name], MD  irbesartan (AVAPRO) 300 MG tablet Take 1 tablet by mouth once daily 12/20/21  Yes [name], MD  meclizine (ANTIVERT) 12.5 MG tablet TAKE 1 TABLET BY MOUTH THREE TIMES DAILY AS NEEDED FOR DIZZINESS 06/24/22  Yes [name], MD  meloxicam (MOBIC) 15 MG tablet TAKE 1 TABLET BY MOUTH ONCE DAILY AS NEEDED FOR PAIN Patient taking differently: Take 15 mg by mouth daily as needed for pain. 03/25/20  Yes [name], MD  Multiple Vitamins-Minerals (ZINC PO) Take 1 tablet by mouth daily.   Yes [provider]  dicyclomine (BENTYL) 20 MG tablet TAKE ONE TABLET BY MOUTH THREE TIMES DAILY BEFORE MEAL(S) Patient not taking: Reported on 06/29/2022 10/26/21   [name], MD  ibuprofen (ADVIL) 800 MG tablet Take 1 tablet (800 mg total) by mouth 3 (three) times daily. Patient not taking: Reported on 06/29/2022 11/20/21   Janell Quiet, PA-C  naproxen sodium (ALEVE) 220 MG tablet Take 440 mg by mouth 2 (two) times daily as needed (pain). Patient not taking: Reported on 06/29/2022    [provider]  pantoprazole (PROTONIX) 40 MG tablet Take 1 tablet (40 mg total) by mouth daily at 6 (six) AM. Patient not taking: Reported on 06/29/2022 10/25/21   Rodolph Bong, MD  promethazine-dextromethorphan (PROMETHAZINE-DM) 6.25-15 MG/5ML syrup Take 5 mLs by mouth 4 (four) times daily as needed for cough. Patient not taking: Reported on 06/29/2022 03/07/22   Particia Nearing, PA-C    Allergies  Allergen Reactions   Latex Itching    Patient Active Problem List   Diagnosis Date Noted   Small bowel obstruction due to adhesions [facility]) 10/20/2021  Dysphagia 07/16/2021   Chronic cough    Left foot pain 06/25/2018   Vertigo of central origin 06/20/2018   Left ankle pain 06/20/2018   Palpitations 06/20/2018   Hyperglycemia 06/20/2018   Lateral epicondylitis, right elbow 09/18/2017   Hypokalemia 06/27/2017   SBO (small bowel obstruction) (HCC) 06/13/2017   MRSA carrier 12/13/2016   Thrush 12/13/2016   Primary localized osteoarthritis of right knee    Abdominal pain 01/15/2016   Orthostasis 09/15/2015   Pain of multiple sites 01/15/2015   Bunion, right foot 12/30/2014   Elevated total protein 09/09/2014    Hyperlipidemia 03/13/2013   Vitamin D deficiency 03/13/2013   Bilateral foot pain 08/31/2012   Anemia, unspecified 08/31/2012   Pain in both feet 07/10/2012   Medial epicondylitis of left elbow 07/10/2012   Medial epicondylitis of right elbow 07/10/2012   Left knee pain 07/10/2012   Chest pain 05/22/2012   Right shoulder pain 05/22/2012   Right foot strain 05/22/2012   Right knee pain 03/25/2012   Encounter for well adult exam with abnormal findings 06/24/2011   Gastroenteritis 06/24/2011   Hypercalcemia 06/24/2011   SHOULDER PAIN, RIGHT 12/30/2008   Gout 11/14/2007   Backache 11/14/2007   ALLERGIC RHINITIS 09/11/2007   INTERMITTENT VERTIGO 09/11/2007   Essential hypertension 06/06/2007   GERD without esophagitis 06/06/2007   Irritable bowel syndrome 06/06/2007   Osteoarthritis 06/06/2007   Low back pain 06/06/2007    Past Medical History:  Diagnosis Date   ALLERGIC RHINITIS 09/11/2007   Anemia, unspecified 08/31/2012   Arthritis    "knees" (11/23/2016)   BACK PAIN    EXTERNAL OTITIS 09/11/2007   GERD 06/06/2007   GOUT 11/14/2007   High cholesterol    HYPERTENSION 06/06/2007   INTERMITTENT VERTIGO 09/11/2007   Irritable bowel syndrome 06/06/2007   LOW BACK PAIN 06/06/2007   OSTEOARTHRITIS 06/06/2007   PONV (postoperative nausea and vomiting)    Primary localized osteoarthritis of right knee    SHOULDER PAIN, RIGHT 12/30/2008    Past Surgical History:  Procedure Laterality Date   51 HOUR PH STUDY N/A 05/27/2019   Procedure: 24 HOUR PH STUDY;  Surgeon: Beverley Fiedler, MD;  Location: [facility];  Service: Gastroenterology;  Laterality: N/A;   BILATERAL SALPINGOOPHORECTOMY     CESAREAN SECTION  1987, 1991   COLONOSCOPY     pt stated 2015 or 2016 unknown md   ESOPHAGEAL MANOMETRY N/A 05/27/2019   Procedure: ESOPHAGEAL MANOMETRY (EM) WITH IMPEDENCE STUDY;  Surgeon: Beverley Fiedler, MD;  Location: [facility];  Service: Gastroenterology;  Laterality: N/A;   EXPLORATORY  LAPAROTOMY     bowel obstruction   INGUINAL HERNIA REPAIR Bilateral    numerous hernia repairs 1996 to 2011   JOINT REPLACEMENT     KNEE ARTHROSCOPY Bilateral 2013 - 11/11/2015   LAPAROSCOPIC CHOLECYSTECTOMY  1991   s/p incarcerated hernia and ovary cyst Jan 2011  2011   TOTAL KNEE ARTHROPLASTY Right 11/21/2016   Procedure: RIGHT TOTAL KNEE ARTHROPLASTY;  Surgeon: Salvatore Marvel, MD;  Location: [facility] OR;  Service: Orthopedics;  Laterality: Right;   UMBILICAL HERNIA REPAIR     numerous hernia repairs 1996 to 2011    Social History   Socioeconomic History   Marital status: Married    Spouse name: Not on file   Number of children: Not on file   Years of education: Not on file   Highest education level: Not on file  Occupational History   Occupation: unemployed recently laid off  Tobacco  Use   Smoking status: Never   Smokeless tobacco: Never  Vaping Use   Vaping Use: Never used  Substance and Sexual Activity   Alcohol use: No   Drug use: No   Sexual activity: Never  Other Topics Concern   Not on file  Social History Narrative   Not on file   Social Determinants of Health   Financial Resource Strain: Not on file  Food Insecurity: Not on file  Transportation Needs: Not on file  Physical Activity: Not on file  Stress: Not on file  Social Connections: Not on file  Intimate Partner Violence: Not on file    Family History  Problem Relation Age of Onset   Arthritis Mother        RA   Diabetes Mother    Seizures Mother    Colon polyps Father 103   Healthy Sister    Healthy Brother    Healthy Son    Healthy Daughter    Asthma Daughter    Colon cancer Neg Hx    Esophageal cancer Neg Hx    Rectal cancer Neg Hx    Stomach cancer Neg Hx      Review of Systems  Constitutional: Negative.  Negative for chills and fever.  HENT: Negative.  Negative for congestion and sore throat.   Eyes: Negative.   Respiratory: Negative.  Negative for cough and shortness of breath.    Cardiovascular: Negative.  Negative for chest pain.  Gastrointestinal:  Negative for abdominal pain, diarrhea, nausea and vomiting.  Genitourinary: Negative.  Negative for dysuria.  Skin: Negative.  Negative for rash.  Neurological:  Positive for dizziness.  All other systems reviewed and are negative.  Today's Vitals   06/29/22 1049 06/29/22 1056 06/29/22 1110  BP: (!) 158/100 (!) 154/98 (!) 142/86  Pulse: 87    Temp: 98.3 F (36.8 C)    TempSrc: Oral    SpO2: 97%    Weight: 228 lb 8 oz (103.6 kg)    Height: 4' 11.5" (1.511 m)     Body mass index is 45.38 kg/m.   Physical Exam Vitals reviewed.  Constitutional:      Appearance: Normal appearance.  HENT:     Head: Normocephalic.     Right Ear: Tympanic membrane, ear canal and external ear normal.     Left Ear: Tympanic membrane, ear canal and external ear normal.     Mouth/Throat:     Mouth: Mucous membranes are moist.     Pharynx: Oropharynx is clear.  Eyes:     Extraocular Movements: Extraocular movements intact.     Conjunctiva/sclera: Conjunctivae normal.     Pupils: Pupils are equal, round, and reactive to light.  Cardiovascular:     Rate and Rhythm: Normal rate and regular rhythm.     Pulses: Normal pulses.     Heart sounds: Normal heart sounds.  Pulmonary:     Effort: Pulmonary effort is normal.     Breath sounds: Normal breath sounds.  Musculoskeletal:     Cervical back: No tenderness.     Right lower leg: No edema.     Left lower leg: No edema.  Lymphadenopathy:     Cervical: No cervical adenopathy.  Skin:    General: Skin is warm and dry.     Capillary Refill: Capillary refill takes less than 2 seconds.  Neurological:     General: No focal deficit present.     Mental Status: She is alert and oriented to person,  place, and time.     Cranial Nerves: No cranial nerve deficit.     Sensory: No sensory deficit.     Motor: No weakness.     Coordination: Coordination normal.     Gait: Gait normal.      Deep Tendon Reflexes: Reflexes normal.  Psychiatric:        Mood and Affect: Mood normal.        Behavior: Behavior normal.      ASSESSMENT & PLAN: A total of 45 minutes was spent with the patient and counseling/coordination of care regarding preparing for this visit, review of most recent office visit notes, review of multiple chronic medical problems and their management, review of all medications, differential diagnosis of chronic vertigo and need for blood work today, use of meclizine as needed, prognosis, documentation, and need for follow-up.  Problem List Items Addressed This Visit       Cardiovascular and Mediastinum   Essential hypertension    Elevated blood pressure readings in the office but normal at home. No changes to present medications needed. Continue irbesartan 300 mg and amlodipine 10 mg daily. Advised to monitor daily blood pressure readings for the next couple of weeks and keep a log.        Other   Vertigo - Primary    Acute exacerbation of chronic condition. Differential diagnosis discussed No red flag signs or symptoms. Benign examination.  No findings of a stroke. Recommend meclizine 25 mg 3 times daily as needed. Blood work done today. ED precautions given Advised to contact the office if no better or worse during the next several days.      Relevant Medications   meclizine (ANTIVERT) 25 MG tablet   Other Relevant Orders   CBC with Differential/Platelet   Comprehensive metabolic panel   Patient Instructions  Vertigo Vertigo is the feeling that you or the things around you are moving when they are not. This feeling can come and go at any time. Vertigo often goes away on its own. This condition can be dangerous if it happens when you are doing activities like driving or working with machines. Your doctor will do tests to find the cause of your vertigo. These tests will also help your doctor decide on the best treatment for you. Follow these  instructions at home: Eating and drinking     Drink enough fluid to keep your pee (urine) pale yellow. Do not drink alcohol. Activity Return to your normal activities when your doctor says that it is safe. In the morning, first sit up on the side of the bed. When you feel okay, stand slowly while you hold onto something until you know that your balance is fine. Move slowly. Avoid sudden body or head movements or certain positions, as told by your doctor. Use a cane if you have trouble standing or walking. Sit down right away if you feel dizzy. Avoid doing any tasks or activities that can cause danger to you or others if you get dizzy. Avoid bending down if you feel dizzy. Place items in your home so that they are easy for you to reach without bending or leaning over. Do not drive or use machinery if you feel dizzy. General instructions Take over-the-counter and prescription medicines only as told by your doctor. Keep all follow-up visits. Contact a doctor if: Your medicine does not help your vertigo. Your problems get worse or you have new symptoms. You have a fever. You feel like you may vomit (  nauseous), or this feeling gets worse. You start to vomit. Your family or friends see changes in how you act. You lose feeling (have numbness) in part of your body. You feel prickling and tingling in a part of your body. Get help right away if: You are always dizzy. You faint. You get very bad headaches. You get a stiff neck. Bright light starts to bother you. You have trouble moving or talking. You feel weak in your hands, arms, or legs. You have changes in your hearing or in how you see (vision). These symptoms may be an emergency. Get help right away. Call your local emergency services (911 in the [place] Do not wait to see if the symptoms will go away. Do not drive yourself to the hospital. Summary Vertigo is the feeling that you or the things around you are moving when they are  not. Your doctor will do tests to find the cause of your vertigo. You may be told to avoid some tasks, positions, or movements. Contact a doctor if your medicine is not helping, or if you have a fever, new symptoms, or a change in how you act. Get help right away if you get very bad headaches, or if you have changes in how you speak, hear, or see. This information is not intended to replace advice given to you by your health care provider. Make sure you discuss any questions you have with your health care provider. Document Revised: 09/02/2020 Document Reviewed: 09/02/2020 Elsevier Patient Education  2023 Else[employer] Inc[employer]    Edwina BarthMiguel Robbie Nangle, MD  [facility]

## 2022-06-29 NOTE — Assessment & Plan Note (Signed)
Elevated blood pressure readings in the office but normal at home. No changes to present medications needed. Continue irbesartan 300 mg and amlodipine 10 mg daily. Advised to monitor daily blood pressure readings for the next couple of weeks and keep a log.

## 2022-06-29 NOTE — Patient Instructions (Signed)
Vertigo Vertigo is the feeling that you or the things around you are moving when they are not. This feeling can come and go at any time. Vertigo often goes away on its own. This condition can be dangerous if it happens when you are doing activities like driving or working with machines. Your doctor will do tests to find the cause of your vertigo. These tests will also help your doctor decide on the best treatment for you. Follow these instructions at home: Eating and drinking     Drink enough fluid to keep your pee (urine) pale yellow. Do not drink alcohol. Activity Return to your normal activities when your doctor says that it is safe. In the morning, first sit up on the side of the bed. When you feel okay, stand slowly while you hold onto something until you know that your balance is fine. Move slowly. Avoid sudden body or head movements or certain positions, as told by your doctor. Use a cane if you have trouble standing or walking. Sit down right away if you feel dizzy. Avoid doing any tasks or activities that can cause danger to you or others if you get dizzy. Avoid bending down if you feel dizzy. Place items in your home so that they are easy for you to reach without bending or leaning over. Do not drive or use machinery if you feel dizzy. General instructions Take over-the-counter and prescription medicines only as told by your doctor. Keep all follow-up visits. Contact a doctor if: Your medicine does not help your vertigo. Your problems get worse or you have new symptoms. You have a fever. You feel like you may vomit (nauseous), or this feeling gets worse. You start to vomit. Your family or friends see changes in how you act. You lose feeling (have numbness) in part of your body. You feel prickling and tingling in a part of your body. Get help right away if: You are always dizzy. You faint. You get very bad headaches. You get a stiff neck. Bright light starts to bother  you. You have trouble moving or talking. You feel weak in your hands, arms, or legs. You have changes in your hearing or in how you see (vision). These symptoms may be an emergency. Get help right away. Call your local emergency services (911 in the [place] Do not wait to see if the symptoms will go away. Do not drive yourself to the hospital. Summary Vertigo is the feeling that you or the things around you are moving when they are not. Your doctor will do tests to find the cause of your vertigo. You may be told to avoid some tasks, positions, or movements. Contact a doctor if your medicine is not helping, or if you have a fever, new symptoms, or a change in how you act. Get help right away if you get very bad headaches, or if you have changes in how you speak, hear, or see. This information is not intended to replace advice given to you by your health care provider. Make sure you discuss any questions you have with your health care provider. Document Revised: 09/02/2020 Document Reviewed: 09/02/2020 Elsevier Patient Education  2023 [employer]  

## 2022-07-08 ENCOUNTER — Telehealth: Payer: Self-pay

## 2022-07-08 NOTE — Telephone Encounter (Signed)
LVM to discuss if a mammogram was done this year.   If so, where?

## 2022-07-14 ENCOUNTER — Encounter: Payer: Self-pay | Admitting: Internal Medicine

## 2022-07-14 ENCOUNTER — Ambulatory Visit (INDEPENDENT_AMBULATORY_CARE_PROVIDER_SITE_OTHER): Payer: Medicare Other | Admitting: Internal Medicine

## 2022-07-14 VITALS — BP 144/90 | HR 83 | Temp 98.1°F | Ht 59.0 in | Wt 231.0 lb

## 2022-07-14 DIAGNOSIS — E7800 Pure hypercholesterolemia, unspecified: Secondary | ICD-10-CM

## 2022-07-14 DIAGNOSIS — E538 Deficiency of other specified B group vitamins: Secondary | ICD-10-CM

## 2022-07-14 DIAGNOSIS — I10 Essential (primary) hypertension: Secondary | ICD-10-CM

## 2022-07-14 DIAGNOSIS — E1165 Type 2 diabetes mellitus with hyperglycemia: Secondary | ICD-10-CM

## 2022-07-14 DIAGNOSIS — E559 Vitamin D deficiency, unspecified: Secondary | ICD-10-CM | POA: Diagnosis not present

## 2022-07-14 DIAGNOSIS — Z0001 Encounter for general adult medical examination with abnormal findings: Secondary | ICD-10-CM | POA: Diagnosis not present

## 2022-07-14 DIAGNOSIS — R739 Hyperglycemia, unspecified: Secondary | ICD-10-CM

## 2022-07-14 LAB — BASIC METABOLIC PANEL
BUN: 14 mg/dL (ref 6–23)
CO2: 29 mEq/L (ref 19–32)
Calcium: 9.6 mg/dL (ref 8.4–10.5)
Chloride: 103 mEq/L (ref 96–112)
Creatinine, Ser: 0.54 mg/dL (ref 0.40–1.20)
GFR: 96.7 mL/min (ref >60.00)
Glucose, Bld: 114 mg/dL — ABNORMAL HIGH (ref 70–99)
Potassium: 3.5 mEq/L (ref 3.5–5.1)
Sodium: 139 mEq/L (ref 135–145)

## 2022-07-14 LAB — URINALYSIS, ROUTINE W REFLEX MICROSCOPIC
Bilirubin Urine: NEGATIVE
Hgb urine dipstick: NEGATIVE
Ketones, ur: NEGATIVE
Leukocytes,Ua: NEGATIVE
Nitrite: NEGATIVE
Specific Gravity, Urine: 1.02 (ref 1.000–1.030)
Total Protein, Urine: NEGATIVE
Urine Glucose: NEGATIVE
Urobilinogen, UA: 0.2 (ref 0.0–1.0)
pH: 7 (ref 5.0–8.0)

## 2022-07-14 LAB — CBC WITH DIFFERENTIAL/PLATELET
Basophils Absolute: 0.1 10*3/uL (ref 0.0–0.1)
Basophils Relative: 0.9 % (ref 0.0–3.0)
Eosinophils Absolute: 0.3 10*3/uL (ref 0.0–0.7)
Eosinophils Relative: 4.4 % (ref 0.0–5.0)
HCT: 36.6 % (ref 36.0–46.0)
Hemoglobin: 11.9 g/dL — ABNORMAL LOW (ref 12.0–15.0)
Lymphocytes Relative: 42.6 % (ref 12.0–46.0)
Lymphs Abs: 2.7 10*3/uL (ref 0.7–4.0)
MCHC: 32.5 g/dL (ref 30.0–36.0)
MCV: 81.2 fl (ref 78.0–100.0)
Monocytes Absolute: 0.5 10*3/uL (ref 0.1–1.0)
Monocytes Relative: 7.9 % (ref 3.0–12.0)
Neutro Abs: 2.8 10*3/uL (ref 1.4–7.7)
Neutrophils Relative %: 44.2 % (ref 43.0–77.0)
Platelets: 290 10*3/uL (ref 150.0–400.0)
RBC: 4.51 Mil/uL (ref 3.87–5.11)
RDW: 14.3 % (ref 11.5–15.5)
WBC: 6.3 10*3/uL (ref 4.0–10.5)

## 2022-07-14 LAB — LIPID PANEL
Cholesterol: 189 mg/dL (ref 0–200)
HDL: 87.8 mg/dL (ref >39.00)
LDL Cholesterol: 88 mg/dL (ref 0–99)
NonHDL: 100.91
Total CHOL/HDL Ratio: 2
Triglycerides: 65 mg/dL (ref 0.0–149.0)
VLDL: 13 mg/dL (ref 0.0–40.0)

## 2022-07-14 LAB — HEPATIC FUNCTION PANEL
ALT: 28 U/L (ref 0–35)
AST: 24 U/L (ref 0–37)
Albumin: 4.3 g/dL (ref 3.5–5.2)
Alkaline Phosphatase: 87 U/L (ref 39–117)
Bilirubin, Direct: 0.2 mg/dL (ref 0.0–0.3)
Total Bilirubin: 0.7 mg/dL (ref 0.2–1.2)
Total Protein: 8.2 g/dL (ref 6.0–8.3)

## 2022-07-14 LAB — VITAMIN D 25 HYDROXY (VIT D DEFICIENCY, FRACTURES): VITD: 33.67 ng/mL (ref 30.00–100.00)

## 2022-07-14 LAB — HEMOGLOBIN A1C: Hgb A1c MFr Bld: 7 % — ABNORMAL HIGH (ref 4.6–6.5)

## 2022-07-14 LAB — TSH: TSH: 1.7 u[IU]/mL (ref 0.35–5.50)

## 2022-07-14 LAB — VITAMIN B12: Vitamin B-12: 1327 pg/mL — ABNORMAL HIGH (ref 211–911)

## 2022-07-14 NOTE — Progress Notes (Signed)
Patient ID: Bianca Campbell, female   DOB: 1956-08-21, 66 y.o.   MRN: [id number]         Chief Complaint:: wellness exam and htn, hyperglycemia, low Vit D       HPI:  Bianca Campbell is a 66 y.o. female here for wellness exam; plans to call soon for pap GYN, declines shingrix, flu, pneumovax and DXA for now,  to f/u any worsening symptoms or concerns                        Also trying to start to walk an hour per day, plans to start soon, but caught up in legal things with her parent estate.  Continuing PT for right shoulder injury and low back pain after fal in feb 2023.  Seen for vertigo recently, now improved. BP at home has been < 140/90.  Pt denies chest pain, increased sob or doe, wheezing, orthopnea, PND, increased LE swelling, palpitations, dizziness or syncope.   Pt denies polydipsia, polyuria, or new focal neuro s/s.    Pt denies fever, night sweats, loss of appetite, or other constitutional symptoms.  Has gained several lbs recently with less active.     Wt Readings from Last 3 Encounters:  07/14/22 231 lb (104.8 kg)  06/29/22 228 lb 8 oz (103.6 kg)  05/02/22 227 lb (103 kg)   BP Readings from Last 3 Encounters:  07/14/22 (!) 144/90  06/29/22 (!) 142/86  05/02/22 140/78   Immunization History  Administered Date(s) Administered   Influenza,inj,Quad PF,6+ Mos 09/15/2015, 08/27/2018   PFIZER(Purple Top)SARS-COV-2 Vaccination 12/24/2019, 01/14/2020   Td 02/24/2010   Tdap 06/24/2020   Health Maintenance Due  Topic Date Due   FOOT EXAM  Never done   OPHTHALMOLOGY EXAM  Never done   Diabetic kidney evaluation - Urine ACR  Never done   PAP SMEAR-Modifier  06/09/2022      Past Medical History:  Diagnosis Date   ALLERGIC RHINITIS 09/11/2007   Anemia, unspecified 08/31/2012   Arthritis    "knees" (11/23/2016)   BACK PAIN    EXTERNAL OTITIS 09/11/2007   GERD 06/06/2007   GOUT 11/14/2007   High cholesterol    HYPERTENSION 06/06/2007   INTERMITTENT VERTIGO 09/11/2007    Irritable bowel syndrome 06/06/2007   LOW BACK PAIN 06/06/2007   OSTEOARTHRITIS 06/06/2007   PONV (postoperative nausea and vomiting)    Primary localized osteoarthritis of right knee    SHOULDER PAIN, RIGHT 12/30/2008   Past Surgical History:  Procedure Laterality Date   42 HOUR [place] STUDY N/A 05/27/2019   Procedure: 24 HOUR PH STUDY;  Surgeon: Jerene Bears, MD;  Location: [facility];  Service: Gastroenterology;  Laterality: N/A;   [place]     pt stated 2015 or 2016 unknown md   ESOPHAGEAL MANOMETRY N/A 05/27/2019   Procedure: ESOPHAGEAL MANOMETRY (EM) WITH IMPEDENCE STUDY;  Surgeon: Jerene Bears, MD;  Location: [facility];  Service: Gastroenterology;  Laterality: N/A;   EXPLORATORY LAPAROTOMY     bowel obstruction   INGUINAL HERNIA REPAIR Bilateral    numerous hernia repairs 1996 to 2011   JOINT REPLACEMENT     KNEE ARTHROSCOPY Bilateral 2013 - 11/11/2015   LAPAROSCOPIC CHOLECYSTECTOMY  1991   s/p incarcerated hernia and ovary cyst Jan 2011  2011   TOTAL KNEE ARTHROPLASTY Right 11/21/2016   Procedure: RIGHT TOTAL KNEE ARTHROPLASTY;  Surgeon: Herbie Baltimore  Thurston Hole, MD;  Location: [facility];  Service: Orthopedics;  Laterality: Right;   UMBILICAL HERNIA REPAIR     numerous hernia repairs 1996 to 2011    reports that she has never smoked. She has never used smokeless tobacco. She reports that she does not drink alcohol and does not use drugs. family history includes Arthritis in her mother; Asthma in her daughter; Colon polyps (age of onset: 30) in her father; Diabetes in her mother; Healthy in her brother, daughter, sister, and son; Seizures in her mother. Allergies  Allergen Reactions   Latex Itching   Current Outpatient Medications on File Prior to Visit  Medication Sig Dispense Refill   acetaminophen (TYLENOL) 500 MG tablet Take 1,000 mg by mouth every 6 (six) hours as needed for mild pain or headache.     amLODipine  (NORVASC) 10 MG tablet Take 1 tablet by mouth once daily 90 tablet 0   amoxicillin-clavulanate (AUGMENTIN) 875-125 MG tablet Take 1 tablet by mouth every 12 (twelve) hours. 14 tablet 0   Ascorbic Acid (VITAMIN C PO) Take 1 tablet by mouth daily.     aspirin EC 81 MG tablet Take 81 mg by mouth daily. Swallow whole.     Cholecalciferol (VITAMIN D3) 5000 units CAPS Take 5,000 Units by mouth daily.     Cyanocobalamin (VITAMIN B 12 PO) Take 1 tablet by mouth daily.     cyclobenzaprine (FLEXERIL) 5 MG tablet Take 1 tablet (5 mg total) by mouth 3 (three) times daily as needed. 60 tablet 2   ibuprofen (ADVIL) 800 MG tablet Take 1 tablet (800 mg total) by mouth 3 (three) times daily. 30 tablet 0   irbesartan (AVAPRO) 300 MG tablet Take 1 tablet by mouth once daily 90 tablet 0   meclizine (ANTIVERT) 25 MG tablet Take 1 tablet (25 mg total) by mouth 3 (three) times daily as needed for dizziness. 30 tablet 0   meloxicam (MOBIC) 15 MG tablet TAKE 1 TABLET BY MOUTH ONCE DAILY AS NEEDED FOR PAIN (Patient taking differently: Take 15 mg by mouth daily as needed for pain.) 90 tablet 0   Multiple Vitamins-Minerals (ZINC PO) Take 1 tablet by mouth daily.     No current facility-administered medications on file prior to visit.        ROS:  All others reviewed and negative.  Objective        PE:  BP (!) 144/90 (BP Location: Left Arm, Patient Position: Sitting, Cuff Size: Large)   Pulse 83   Temp 98.1 F (36.7 C) (Oral)   Ht 4\' 11"  (1.499 m)   Wt 231 lb (104.8 kg)   SpO2 97%   BMI 46.66 kg/m                 Constitutional: Pt appears in NAD               HENT: Head: NCAT.                Right Ear: External ear normal.                 Left Ear: External ear normal.                Eyes: . Pupils are equal, round, and reactive to light. Conjunctivae and EOM are normal               Nose: without d/c or deformity  Neck: Neck supple. Gross normal ROM               Cardiovascular: Normal rate and  regular rhythm.                 Pulmonary/Chest: Effort normal and breath sounds without rales or wheezing.                Abd:  Soft, NT, ND, + BS, no organomegaly               Neurological: Pt is alert. At baseline orientation, motor grossly intact               Skin: Skin is warm. No rashes, no other new lesions, LE edema - none               Psychiatric: Pt behavior is normal without agitation   Micro: none  Cardiac tracings I have personally interpreted today:  none  Pertinent Radiological findings (summarize): none   Lab Results  Component Value Date   WBC 6.3 07/14/2022   HGB 11.9 (L) 07/14/2022   HCT 36.6 07/14/2022   PLT 290.0 07/14/2022   GLUCOSE 114 (H) 07/14/2022   CHOL 189 07/14/2022   TRIG 65.0 07/14/2022   HDL 87.80 07/14/2022   LDLDIRECT 114.4 03/13/2013   LDLCALC 88 07/14/2022   ALT 28 07/14/2022   AST 24 07/14/2022   NA 139 07/14/2022   K 3.5 07/14/2022   CL 103 07/14/2022   CREATININE 0.54 07/14/2022   BUN 14 07/14/2022   CO2 29 07/14/2022   TSH 1.70 07/14/2022   INR 1.06 11/10/2016   HGBA1C 7.0 (H) 07/14/2022   Assessment/Plan:  Bianca Campbell is a 66 y.o. Black or African American [2] female with  has a past medical history of ALLERGIC RHINITIS (09/11/2007), Anemia, unspecified (08/31/2012), Arthritis, BACK PAIN, EXTERNAL OTITIS (09/11/2007), GERD (06/06/2007), GOUT (11/14/2007), High cholesterol, HYPERTENSION (06/06/2007), INTERMITTENT VERTIGO (09/11/2007), Irritable bowel syndrome (06/06/2007), LOW BACK PAIN (06/06/2007), OSTEOARTHRITIS (06/06/2007), PONV (postoperative nausea and vomiting), Primary localized osteoarthritis of right knee, and SHOULDER PAIN, RIGHT (12/30/2008).  Encounter for well adult exam with abnormal findings Age and sex appropriate education and counseling updated with regular exercise and diet Referrals for preventative services - pt to call for routine GYn pap soon, declines dxa Immunizations addressed - decliens shingrix, flu,  pneumovax Smoking counseling  - none needed Evidence for depression or other mood disorder - none significant Most recent labs reviewed. I have personally reviewed and have noted: 1) the patient's medical and social history 2) The patient's current medications and supplements 3) The patient's height, weight, and BMI have been recorded in the chart   Vitamin D deficiency Last vitamin D Lab Results  Component Value Date   VD25OH 33.67 07/14/2022   Lo, to start oral replacement   Hyperlipidemia Lab Results  Component Value Date   LDLCALC 88 07/14/2022   Stable, pt to continue current low chol diet   Essential hypertension BP Readings from Last 3 Encounters:  07/14/22 (!) 144/90  06/29/22 (!) 142/86  05/02/22 140/78   Uncontrolled here, but pt states controlled at home, pt to continue to monitor, declines any change today - norvasc 10 mg qd, avapro 300 mg qd   Diabetes (HCC) Lab Results  Component Value Date   HGBA1C 7.0 (H) 07/14/2022   Mild uncontrolled, pt to continue current diet, wt control, exercise, and consider OHA or ozempic for worsening  Followup: Return in about 6 months (  around 01/12/2023).  Oliver Barre, MD 07/16/2022 4:11 PM [facility]

## 2022-07-14 NOTE — Patient Instructions (Signed)

## 2022-07-16 ENCOUNTER — Encounter: Payer: Self-pay | Admitting: Internal Medicine

## 2022-07-16 DIAGNOSIS — E119 Type 2 diabetes mellitus without complications: Secondary | ICD-10-CM | POA: Insufficient documentation

## 2022-07-16 NOTE — Assessment & Plan Note (Signed)
BP Readings from Last 3 Encounters:  07/14/22 (!) 144/90  06/29/22 (!) 142/86  05/02/22 140/78   Uncontrolled here, but pt states controlled at home, pt to continue to monitor, declines any change today - norvasc 10 mg qd, avapro 300 mg qd

## 2022-07-16 NOTE — Assessment & Plan Note (Signed)
Lab Results  Component Value Date   HGBA1C 7.0 (H) 07/14/2022   Mild uncontrolled, pt to continue current diet, wt control, exercise, and consider OHA or ozempic for worsening

## 2022-07-16 NOTE — Assessment & Plan Note (Addendum)
Age and sex appropriate education and counseling updated with regular exercise and diet Referrals for preventative services - pt to call for routine GYn pap soon, declines dxa Immunizations addressed - decliens shingrix, flu, pneumovax Smoking counseling  - none needed Evidence for depression or other mood disorder - none significant Most recent labs reviewed. I have personally reviewed and have noted: 1) the patient's medical and social history 2) The patient's current medications and supplements 3) The patient's height, weight, and BMI have been recorded in the chart

## 2022-07-16 NOTE — Assessment & Plan Note (Signed)
Last vitamin D Lab Results  Component Value Date   VD25OH 33.67 07/14/2022   Lo, to start oral replacement

## 2022-07-16 NOTE — Assessment & Plan Note (Signed)
Lab Results  Component Value Date   LDLCALC 88 07/14/2022   Stable, pt to continue current low chol diet

## 2022-09-09 ENCOUNTER — Other Ambulatory Visit: Payer: Self-pay | Admitting: Internal Medicine

## 2022-09-09 NOTE — Telephone Encounter (Signed)
Please refill as per office routine med refill policy (all routine meds to be refilled for 3 mo or monthly (per pt preference) up to one year from last visit, then month to month grace period for 3 mo, then further med refills will have to be denied) ? ?

## 2022-09-13 ENCOUNTER — Other Ambulatory Visit: Payer: Self-pay | Admitting: Internal Medicine

## 2022-10-22 ENCOUNTER — Other Ambulatory Visit: Payer: Self-pay | Admitting: Internal Medicine

## 2022-10-22 ENCOUNTER — Encounter: Payer: Self-pay | Admitting: Podiatry

## 2022-10-22 ENCOUNTER — Ambulatory Visit: Payer: Medicare Other | Admitting: Podiatry

## 2022-10-22 ENCOUNTER — Ambulatory Visit (INDEPENDENT_AMBULATORY_CARE_PROVIDER_SITE_OTHER): Payer: Medicare Other

## 2022-10-22 VITALS — BP 152/89 | HR 81

## 2022-10-22 DIAGNOSIS — M778 Other enthesopathies, not elsewhere classified: Secondary | ICD-10-CM

## 2022-10-22 DIAGNOSIS — M205X2 Other deformities of toe(s) (acquired), left foot: Secondary | ICD-10-CM

## 2022-10-22 DIAGNOSIS — M7662 Achilles tendinitis, left leg: Secondary | ICD-10-CM | POA: Diagnosis not present

## 2022-10-22 NOTE — Progress Notes (Signed)
Subjective:   Patient ID: Bianca Campbell, female   DOB: 67 y.o.   MRN: [id number]   HPI Patient presents with pain in the left heel approximately 1 year duration which is gradually gotten worse over the last few months.  States that it gets very sore when she tries to be active or wear shoe gear that presses against the back.  Patient does not remember injury mild high blood pressure discussed does not smoke likes to be active   Review of Systems  All other systems reviewed and are negative.       Objective:  Physical Exam Vitals and nursing note reviewed.  Constitutional:      Appearance: She is well-developed.  Pulmonary:     Effort: Pulmonary effort is normal.  Musculoskeletal:        General: Normal range of motion.  Skin:    General: Skin is warm.  Neurological:     Mental Status: She is alert.     Neurovascular status found to be intact muscle strength was found to be adequate range of motion adequate.  Patient is noted to have discomfort posterior aspect heel left at the insertional point of the tendon medial side into the calcaneus with fluid buildup around the insertional point.  Patient is noted to have good digital perfusion well oriented x 3.  No equinus condition no muscle strength loss noted     Assessment:  Acute Achilles tendinitis left in the posterior medial aspect of the heel no central or lateral involvement noted     Plan:  H&P reviewed condition and I have recommended an aggressive conservative approach.  I did discuss with her injection treatment I did discuss with her risk of doing injections and what can occur with this.  Patient would like to pursue this approach and I also am going to immobilize to help prevent any chance of rupture and keep it stabilized and I went ahead did sterile prep and injected the medial side of the tendon 3 mg Dexasone Kenalog 5 mg Xylocaine keep it away from the center and lateral and applied air fracture walker properly  fitted to her lower leg that I want her to wear at all times.  Reappoint for [place] to recheck 3 to 4 weeks or earlier if needed. Xray indicates small spur posterior. No indication of arthritis

## 2022-10-22 NOTE — Telephone Encounter (Signed)
Please refill as per office routine med refill policy (all routine meds to be refilled for 3 mo or monthly (per pt preference) up to one year from last visit, then month to month grace period for 3 mo, then further med refills will have to be denied) ? ?

## 2022-10-22 NOTE — Patient Instructions (Signed)

## 2022-11-02 ENCOUNTER — Ambulatory Visit: Payer: Medicare Other | Admitting: Internal Medicine

## 2022-11-02 ENCOUNTER — Encounter: Payer: Self-pay | Admitting: Internal Medicine

## 2022-11-02 VITALS — BP 130/78 | HR 78 | Temp 98.3°F | Ht 59.0 in | Wt 225.0 lb

## 2022-11-02 DIAGNOSIS — Z0001 Encounter for general adult medical examination with abnormal findings: Secondary | ICD-10-CM | POA: Diagnosis not present

## 2022-11-02 DIAGNOSIS — R052 Subacute cough: Secondary | ICD-10-CM | POA: Diagnosis not present

## 2022-11-02 DIAGNOSIS — R42 Dizziness and giddiness: Secondary | ICD-10-CM

## 2022-11-02 DIAGNOSIS — E1165 Type 2 diabetes mellitus with hyperglycemia: Secondary | ICD-10-CM

## 2022-11-02 DIAGNOSIS — E7800 Pure hypercholesterolemia, unspecified: Secondary | ICD-10-CM | POA: Diagnosis not present

## 2022-11-02 DIAGNOSIS — E559 Vitamin D deficiency, unspecified: Secondary | ICD-10-CM | POA: Diagnosis not present

## 2022-11-02 DIAGNOSIS — E538 Deficiency of other specified B group vitamins: Secondary | ICD-10-CM | POA: Diagnosis not present

## 2022-11-02 DIAGNOSIS — Z23 Encounter for immunization: Secondary | ICD-10-CM | POA: Diagnosis not present

## 2022-11-02 DIAGNOSIS — R059 Cough, unspecified: Secondary | ICD-10-CM | POA: Insufficient documentation

## 2022-11-02 DIAGNOSIS — I10 Essential (primary) hypertension: Secondary | ICD-10-CM

## 2022-11-02 LAB — LIPID PANEL
Cholesterol: 208 mg/dL — ABNORMAL HIGH (ref 0–200)
HDL: 82.4 mg/dL (ref >39.00)
LDL Cholesterol: 107 mg/dL — ABNORMAL HIGH (ref 0–99)
NonHDL: 125.31
Total CHOL/HDL Ratio: 3
Triglycerides: 91 mg/dL (ref 0.0–149.0)
VLDL: 18.2 mg/dL (ref 0.0–40.0)

## 2022-11-02 LAB — CBC WITH DIFFERENTIAL/PLATELET
Basophils Absolute: 0 10*3/uL (ref 0.0–0.1)
Basophils Relative: 0.4 % (ref 0.0–3.0)
Eosinophils Absolute: 0.3 10*3/uL (ref 0.0–0.7)
Eosinophils Relative: 4.3 % (ref 0.0–5.0)
HCT: 37.8 % (ref 36.0–46.0)
Hemoglobin: 12.6 g/dL (ref 12.0–15.0)
Lymphocytes Relative: 40.9 % (ref 12.0–46.0)
Lymphs Abs: 2.9 10*3/uL (ref 0.7–4.0)
MCHC: 33.2 g/dL (ref 30.0–36.0)
MCV: 81.2 fl (ref 78.0–100.0)
Monocytes Absolute: 0.5 10*3/uL (ref 0.1–1.0)
Monocytes Relative: 7.1 % (ref 3.0–12.0)
Neutro Abs: 3.3 10*3/uL (ref 1.4–7.7)
Neutrophils Relative %: 47.3 % (ref 43.0–77.0)
Platelets: 336 10*3/uL (ref 150.0–400.0)
RBC: 4.66 Mil/uL (ref 3.87–5.11)
RDW: 13.9 % (ref 11.5–15.5)
WBC: 7 10*3/uL (ref 4.0–10.5)

## 2022-11-02 LAB — BASIC METABOLIC PANEL
BUN: 18 mg/dL (ref 6–23)
CO2: 27 mEq/L (ref 19–32)
Calcium: 9.5 mg/dL (ref 8.4–10.5)
Chloride: 101 mEq/L (ref 96–112)
Creatinine, Ser: 0.65 mg/dL (ref 0.40–1.20)
GFR: 92.28 mL/min (ref >60.00)
Glucose, Bld: 84 mg/dL (ref 70–99)
Potassium: 3.9 mEq/L (ref 3.5–5.1)
Sodium: 136 mEq/L (ref 135–145)

## 2022-11-02 LAB — VITAMIN B12: Vitamin B-12: 1140 pg/mL — ABNORMAL HIGH (ref 211–911)

## 2022-11-02 LAB — HEPATIC FUNCTION PANEL
ALT: 18 U/L (ref 0–35)
AST: 20 U/L (ref 0–37)
Albumin: 4.4 g/dL (ref 3.5–5.2)
Alkaline Phosphatase: 105 U/L (ref 39–117)
Bilirubin, Direct: 0.1 mg/dL (ref 0.0–0.3)
Total Bilirubin: 0.7 mg/dL (ref 0.2–1.2)
Total Protein: 8.5 g/dL — ABNORMAL HIGH (ref 6.0–8.3)

## 2022-11-02 LAB — TSH: TSH: 1.82 u[IU]/mL (ref 0.35–5.50)

## 2022-11-02 LAB — MICROALBUMIN / CREATININE URINE RATIO
Creatinine,U: 130.9 mg/dL
Microalb Creat Ratio: 1.9 mg/g (ref 0.0–30.0)
Microalb, Ur: 2.5 mg/dL — ABNORMAL HIGH (ref 0.0–1.9)

## 2022-11-02 LAB — HEMOGLOBIN A1C: Hgb A1c MFr Bld: 7 % — ABNORMAL HIGH (ref 4.6–6.5)

## 2022-11-02 LAB — VITAMIN D 25 HYDROXY (VIT D DEFICIENCY, FRACTURES): VITD: 30.02 ng/mL (ref 30.00–100.00)

## 2022-11-02 MED ORDER — MECLIZINE HCL 25 MG PO TABS: 25.0000 mg | ORAL_TABLET | Freq: Three times a day (TID) | ORAL | 5 refills | Status: AC | PRN

## 2022-11-02 MED ORDER — HYDROCODONE BIT-HOMATROP MBR 5-1.5 MG/5ML PO SOLN: 5.0000 mL | Freq: Four times a day (QID) | ORAL | 0 refills | Status: AC | PRN

## 2022-11-02 NOTE — Progress Notes (Signed)
Patient ID: Bianca Campbell, female   DOB: 1956/04/18, 67 y.o.   MRN: [id number]         Chief Complaint:: wellness exam and 57mo follow up (Had covid during Christmas but still has lingering cough)  , dm, htn, hld, low vit d, veritgo       HPI:  Bianca Campbell is a 67 y.o. female here for wellness exam; declines pap. Optho referral, flu shot, shingrix,  dxa for now, for prevnar 20 today, o/w up to date                        Also had episode covid infection 1 mo ago, now resolved, cough still lingering and slight bronchial congestion, fatigue.    S/p paxlovid tx.  Left foot and ankle in boot with tendonitis and bone spurs.  Has f/u jan 22 with orthopedic.  Pt denies chest pain, increased sob or doe, wheezing, orthopnea, PND, increased LE swelling, palpitations, or syncope but has mild interrmittent vertigo as well..   Pt denies polydipsia, polyuria, or new focal neuro s/s.    Pt denies fever, wt loss, night sweats, loss of appetite, or other constitutional symptoms     Wt Readings from Last 3 Encounters:  11/02/22 225 lb (102.1 kg)  07/14/22 231 lb (104.8 kg)  06/29/22 228 lb 8 oz (103.6 kg)   BP Readings from Last 3 Encounters:  11/02/22 130/78  10/22/22 (!) 152/89  07/14/22 (!) 144/90   Immunization History  Administered Date(s) Administered   Influenza,inj,Quad PF,6+ Mos 09/15/2015, 08/27/2018   PFIZER(Purple Top)SARS-COV-2 Vaccination 12/24/2019, 01/14/2020   PNEUMOCOCCAL CONJUGATE-20 11/02/2022   Td 02/24/2010   Tdap 06/24/2020   Health Maintenance Due  Topic Date Due   Diabetic kidney evaluation - Urine ACR  Never done      Past Medical History:  Diagnosis Date   ALLERGIC RHINITIS 09/11/2007   Anemia, unspecified 08/31/2012   Arthritis    "knees" (11/23/2016)   BACK PAIN    EXTERNAL OTITIS 09/11/2007   GERD 06/06/2007   GOUT 11/14/2007   High cholesterol    HYPERTENSION 06/06/2007   INTERMITTENT VERTIGO 09/11/2007   Irritable bowel syndrome 06/06/2007   LOW BACK PAIN  06/06/2007   OSTEOARTHRITIS 06/06/2007   PONV (postoperative nausea and vomiting)    Primary localized osteoarthritis of right knee    SHOULDER PAIN, RIGHT 12/30/2008   Past Surgical History:  Procedure Laterality Date   32 HOUR PH STUDY N/A 05/27/2019   Procedure: 24 HOUR PH STUDY;  Surgeon: Beverley Fiedler, MD;  Location: [facility];  Service: Gastroenterology;  Laterality: N/A;   BILATERAL SALPINGOOPHORECTOMY     CESAREAN SECTION  1987, 1991   COLONOSCOPY     pt stated 2015 or 2016 unknown md   ESOPHAGEAL MANOMETRY N/A 05/27/2019   Procedure: ESOPHAGEAL MANOMETRY (EM) WITH IMPEDENCE STUDY;  Surgeon: Beverley Fiedler, MD;  Location: [facility];  Service: Gastroenterology;  Laterality: N/A;   EXPLORATORY LAPAROTOMY     bowel obstruction   INGUINAL HERNIA REPAIR Bilateral    numerous hernia repairs 1996 to 2011   JOINT REPLACEMENT     KNEE ARTHROSCOPY Bilateral 2013 - 11/11/2015   LAPAROSCOPIC CHOLECYSTECTOMY  1991   s/p incarcerated hernia and ovary cyst Jan 2011  2011   TOTAL KNEE ARTHROPLASTY Right 11/21/2016   Procedure: RIGHT TOTAL KNEE ARTHROPLASTY;  Surgeon: Salvatore Marvel, MD;  Location: [facility] OR;  Service: Orthopedics;  Laterality: Right;   UMBILICAL  HERNIA REPAIR     numerous hernia repairs 1996 to 2011    reports that she has never smoked. She has never used smokeless tobacco. She reports that she does not drink alcohol and does not use drugs. family history includes Arthritis in her mother; Asthma in her daughter; Colon polyps (age of onset: 41) in her father; Diabetes in her mother; Healthy in her brother, daughter, sister, and son; Seizures in her mother. Allergies  Allergen Reactions   Latex Itching   Current Outpatient Medications on File Prior to Visit  Medication Sig Dispense Refill   acetaminophen (TYLENOL) 500 MG tablet Take 1,000 mg by mouth every 6 (six) hours as needed for mild pain or headache.     amLODipine (NORVASC) 10 MG tablet Take 1 tablet by mouth once daily  90 tablet 1   Ascorbic Acid (VITAMIN C PO) Take 1 tablet by mouth daily.     aspirin EC 81 MG tablet Take 81 mg by mouth daily. Swallow whole.     Cholecalciferol (VITAMIN D3) 5000 units CAPS Take 5,000 Units by mouth daily.     Cyanocobalamin (VITAMIN B 12 PO) Take 1 tablet by mouth daily.     cyclobenzaprine (FLEXERIL) 5 MG tablet Take 1 tablet (5 mg total) by mouth 3 (three) times daily as needed. 60 tablet 2   ibuprofen (ADVIL) 800 MG tablet Take 1 tablet (800 mg total) by mouth 3 (three) times daily. 30 tablet 0   irbesartan (AVAPRO) 300 MG tablet Take 1 tablet by mouth once daily 90 tablet 0   meloxicam (MOBIC) 15 MG tablet TAKE 1 TABLET BY MOUTH ONCE DAILY AS NEEDED FOR PAIN 90 tablet 3   Multiple Vitamins-Minerals (ZINC PO) Take 1 tablet by mouth daily.     amoxicillin-clavulanate (AUGMENTIN) 875-125 MG tablet Take 1 tablet by mouth every 12 (twelve) hours. (Patient not taking: Reported on 11/02/2022) 14 tablet 0   PAXLOVID, 300/100, 20 x 150 MG & 10 x 100MG  TBPK Take 3 tablets by mouth 2 (two) times daily. (Patient not taking: Reported on 11/02/2022)     No current facility-administered medications on file prior to visit.        ROS:  All others reviewed and negative.  Objective        PE:  BP 130/78 (BP Location: Left Arm, Patient Position: Sitting, Cuff Size: Large)   Pulse 78   Temp 98.3 F (36.8 C) (Oral)   Ht 4\' 11"  (1.499 m)   Wt 225 lb (102.1 kg)   SpO2 96%   BMI 45.44 kg/m                 Constitutional: Pt appears in NAD               HENT: Head: NCAT.                Right Ear: External ear normal.                 Left Ear: External ear normal.                Eyes: . Pupils are equal, round, and reactive to light. Conjunctivae and EOM are normal               Nose: without d/c or deformity               Neck: Neck supple. Gross normal ROM  Cardiovascular: Normal rate and regular rhythm.                 Pulmonary/Chest: Effort normal and breath sounds  without rales or wheezing.                Abd:  Soft, NT, ND, + BS, no organomegaly               Neurological: Pt is alert. At baseline orientation, motor grossly intact               Skin: Skin is warm. No rashes, no other new lesions, LE edema - none               Psychiatric: Pt behavior is normal without agitation   Micro: none  Cardiac tracings I have personally interpreted today:  none  Pertinent Radiological findings (summarize): none   Lab Results  Component Value Date   WBC 6.3 07/14/2022   HGB 11.9 (L) 07/14/2022   HCT 36.6 07/14/2022   PLT 290.0 07/14/2022   GLUCOSE 114 (H) 07/14/2022   CHOL 189 07/14/2022   TRIG 65.0 07/14/2022   HDL 87.80 07/14/2022   LDLDIRECT 114.4 03/13/2013   LDLCALC 88 07/14/2022   ALT 28 07/14/2022   AST 24 07/14/2022   NA 139 07/14/2022   K 3.5 07/14/2022   CL 103 07/14/2022   CREATININE 0.54 07/14/2022   BUN 14 07/14/2022   CO2 29 07/14/2022   TSH 1.70 07/14/2022   INR 1.06 11/10/2016   HGBA1C 7.0 (H) 07/14/2022   Assessment/Plan:  CALANDRA MADURA is a 67 y.o. Black or African American [2] female with  has a past medical history of ALLERGIC RHINITIS (09/11/2007), Anemia, unspecified (08/31/2012), Arthritis, BACK PAIN, EXTERNAL OTITIS (09/11/2007), GERD (06/06/2007), GOUT (11/14/2007), High cholesterol, HYPERTENSION (06/06/2007), INTERMITTENT VERTIGO (09/11/2007), Irritable bowel syndrome (06/06/2007), LOW BACK PAIN (06/06/2007), OSTEOARTHRITIS (06/06/2007), PONV (postoperative nausea and vomiting), Primary localized osteoarthritis of right knee, and SHOULDER PAIN, RIGHT (12/30/2008).  Encounter for well adult exam with abnormal findings Age and sex appropriate education and counseling updated with regular exercise and diet Referrals for preventative services - decines pap, optho referral, dxa for now Immunizations addressed - declines flu shot, but may have shingrix at pharmacy, for prevnar 20 today Smoking counseling  - none  needed Evidence for depression or other mood disorder - none significant Most recent labs reviewed. I have personally reviewed and have noted: 1) the patient's medical and social history 2) The patient's current medications and supplements 3) The patient's height, weight, and BMI have been recorded in the chart   Diabetes [facility]) Lab Results  Component Value Date   HGBA1C 7.0 (H) 07/14/2022   Uncontrolled, goal A1c < 7, pt to continue current medical treatment  - diet, wt control, declines OHA for now   Essential hypertension BP Readings from Last 3 Encounters:  11/02/22 130/78  10/22/22 (!) 152/89  07/14/22 (!) 144/90   Stable, pt to continue medical treatment norvasc 10 mg qd, avapro 300 mg qd   Hyperlipidemia Lab Results  Component Value Date   LDLCALC 88 07/14/2022   Uncontrolled, goal ldl < 70,, pt to continue low chol diet, for f/u lab today, decliens statin for now   Vitamin D deficiency Last vitamin D Lab Results  Component Value Date   VD25OH 33.67 07/14/2022   Low, to start oral replacement  Vertigo Mild intermittent, for meclizine prn refill  Cough Mild intermittent post covid hopefully to improve soon, for  hycodan prn,   Followup: Return in about 6 months (around 05/03/2023).  Oliver Barre, MD 11/02/2022 8:14 PM [facility] [place] [facility]

## 2022-11-02 NOTE — Assessment & Plan Note (Signed)
Mild intermittent, for meclizine prn refill

## 2022-11-02 NOTE — Assessment & Plan Note (Signed)
BP Readings from Last 3 Encounters:  11/02/22 130/78  10/22/22 (!) 152/89  07/14/22 (!) 144/90   Stable, pt to continue medical treatment norvasc 10 mg qd, avapro 300 mg qd

## 2022-11-02 NOTE — Assessment & Plan Note (Signed)
Mild intermittent post covid hopefully to improve soon, for hycodan prn,

## 2022-11-02 NOTE — Assessment & Plan Note (Addendum)
Age and sex appropriate education and counseling updated with regular exercise and diet Referrals for preventative services - decines pap, optho referral, dxa for now Immunizations addressed - declines flu shot, but may have shingrix at pharmacy, for prevnar 20 today Smoking counseling  - none needed Evidence for depression or other mood disorder - none significant Most recent labs reviewed. I have personally reviewed and have noted: 1) the patient's medical and social history 2) The patient's current medications and supplements 3) The patient's height, weight, and BMI have been recorded in the chart

## 2022-11-02 NOTE — Assessment & Plan Note (Signed)
Last vitamin D Lab Results  Component Value Date   VD25OH 33.67 07/14/2022   Low, to start oral replacement

## 2022-11-02 NOTE — Patient Instructions (Addendum)
.  Please have your Shingrix (shingles) shots done at your local pharmacy.  Please take all new medication as prescribed - the cough medicine  You had the Prevnar 20 pneumonia shot today  Please continue all other medications as before, and refills have been done if requested.  Please have the pharmacy call with any other refills you may need.  Please continue your efforts at being more active, low cholesterol diet, and weight control.  You are otherwise up to date with prevention measures today.  Please keep your appointments with your specialists as you may have planned  Please go to the LAB at the blood drawing area for the tests to be done  You will be contacted by phone if any changes need to be made immediately.  Otherwise, you will receive a letter about your results with an explanation, but please check with MyChart first.  Please remember to sign up for MyChart if you have not done so, as this will be important to you in the future with finding out test results, communicating by private email, and scheduling acute appointments online when needed.  Please make an Appointment to return in 6 months, or sooner if needed

## 2022-11-02 NOTE — Assessment & Plan Note (Signed)
Lab Results  Component Value Date   LDLCALC 88 07/14/2022   Uncontrolled, goal ldl < 70,, pt to continue low chol diet, for f/u lab today, decliens statin for now

## 2022-11-02 NOTE — Assessment & Plan Note (Signed)
Lab Results  Component Value Date   HGBA1C 7.0 (H) 07/14/2022   Uncontrolled, goal A1c < 7, pt to continue current medical treatment  - diet, wt control, declines OHA for now

## 2022-11-03 LAB — URINALYSIS, ROUTINE W REFLEX MICROSCOPIC
Bilirubin Urine: NEGATIVE
Hgb urine dipstick: NEGATIVE
Ketones, ur: NEGATIVE
Leukocytes,Ua: NEGATIVE
Nitrite: NEGATIVE
RBC / HPF: NONE SEEN (ref 0–?)
Specific Gravity, Urine: 1.02 (ref 1.000–1.030)
Total Protein, Urine: NEGATIVE
Urine Glucose: NEGATIVE
Urobilinogen, UA: 0.2 (ref 0.0–1.0)
pH: 6 (ref 5.0–8.0)

## 2022-11-07 ENCOUNTER — Encounter: Payer: Self-pay | Admitting: Podiatry

## 2022-11-07 ENCOUNTER — Ambulatory Visit: Payer: Medicare Other | Admitting: Podiatry

## 2022-11-07 DIAGNOSIS — M7662 Achilles tendinitis, left leg: Secondary | ICD-10-CM | POA: Diagnosis not present

## 2022-11-07 DIAGNOSIS — M205X2 Other deformities of toe(s) (acquired), left foot: Secondary | ICD-10-CM

## 2022-11-07 NOTE — Progress Notes (Signed)
Subjective:   Patient ID: Bianca Campbell, female   DOB: 67 y.o.   MRN: [id number]   HPI Patient states that she is improved stating she still can have problems at certain times but overall much better   ROS      Objective:  Physical Exam  Neurovascular status intact discomfort still noted of a mild nature left foot but significant improvement from where she was several weeks ago with patient walking with a better heel toe gait pattern     Assessment:  Improvement of capsulitis left with pain still present upon deep palpation but much better than previous     Plan:  Reviewed condition recommended the continuation of conservative care discussed shoe gear modifications stretching exercises and the gradual reduction of the boot over the next couple weeks.  Discussed Achilles tendinitis and the importance of the stretch.  Patient will be seen back may require more aggressive treatment in future which I discussed today

## 2022-12-23 ENCOUNTER — Other Ambulatory Visit: Payer: Self-pay | Admitting: Internal Medicine

## 2023-03-09 ENCOUNTER — Ambulatory Visit: Payer: Medicare Other | Admitting: Podiatry

## 2023-03-09 ENCOUNTER — Encounter: Payer: Self-pay | Admitting: Podiatry

## 2023-03-09 DIAGNOSIS — M7662 Achilles tendinitis, left leg: Secondary | ICD-10-CM | POA: Diagnosis not present

## 2023-03-09 DIAGNOSIS — M205X2 Other deformities of toe(s) (acquired), left foot: Secondary | ICD-10-CM

## 2023-03-09 MED ORDER — DICLOFENAC SODIUM 75 MG PO TBEC: 75.0000 mg | DELAYED_RELEASE_TABLET | Freq: Two times a day (BID) | ORAL | 2 refills | Status: DC

## 2023-03-09 NOTE — Progress Notes (Signed)
Subjective:   Patient ID: Bianca Campbell, female   DOB: 67 y.o.   MRN: [id number]   HPI Patient presents with a lot of pain back of left heel is getting ready to go on a cruise in 2 weeks.  States it has been sore did do very well for around 3 months   ROS      Objective:  Physical Exam  Neurovascular status intact inflammation pain of the Achilles tendon insertion left lateral side with no central medial involvement     Assessment:  Achilles tendinitis left and also does have reduction of range of motion first MPJ     Plan:  H&P reviewed conditions at great length.  I do think we can do 1 more careful injection with immobilization left and I discussed this versus oral medicine topical she is opted for oral topical and will start diclofenac gel and medication pills.  Reviewed that we may need to get her in prior to going on her cruise due to the pain but will hopefully respond to conservative along with immobilization.  Do not recommend any treatment hallux limitus but did review

## 2023-03-17 ENCOUNTER — Telehealth: Payer: Self-pay

## 2023-03-17 MED ORDER — SCOPOLAMINE 1 MG/3DAYS TD PT72: 1.0000 | MEDICATED_PATCH | TRANSDERMAL | 0 refills | Status: DC

## 2023-03-17 NOTE — Telephone Encounter (Signed)
Pt is asking if PCP can be sent in a rx for Motion patches for traveling as it helps motion sickness as she is about to go on a trip and is needing it for her trip.  Please send to the [facility] on file.

## 2023-03-17 NOTE — Addendum Note (Signed)
Addended by: [name] on: 03/17/2023 04:53 PM   Modules accepted: Orders

## 2023-03-17 NOTE — Telephone Encounter (Signed)
Ok done erx 

## 2023-04-05 ENCOUNTER — Ambulatory Visit
Admission: EM | Admit: 2023-04-05 | Discharge: 2023-04-05 | Disposition: A | Discharge status: Home / Self Care | Payer: Medicare Other | Attending: Internal Medicine | Admitting: Internal Medicine

## 2023-04-05 DIAGNOSIS — J101 Influenza due to other identified influenza virus with other respiratory manifestations: Secondary | ICD-10-CM | POA: Insufficient documentation

## 2023-04-05 DIAGNOSIS — J029 Acute pharyngitis, unspecified: Secondary | ICD-10-CM | POA: Diagnosis present

## 2023-04-05 LAB — POCT INFLUENZA A/B
Influenza A, POC: POSITIVE — AB
Influenza B, POC: NEGATIVE

## 2023-04-05 LAB — POCT RAPID STREP A (OFFICE): Rapid Strep A Screen: NEGATIVE

## 2023-04-05 MED ORDER — ONDANSETRON 4 MG PO TBDP: 4.0000 mg | ORAL_TABLET | Freq: Three times a day (TID) | ORAL | 0 refills | Status: DC | PRN

## 2023-04-05 MED ORDER — FLUTICASONE PROPIONATE 50 MCG/ACT NA SUSP: 1.0000 | Freq: Every day | NASAL | 0 refills | Status: DC

## 2023-04-05 MED ORDER — BENZONATATE 100 MG PO CAPS: 100.0000 mg | ORAL_CAPSULE | Freq: Three times a day (TID) | ORAL | 0 refills | Status: DC | PRN

## 2023-04-05 MED ORDER — OSELTAMIVIR PHOSPHATE 75 MG PO CAPS: 75.0000 mg | ORAL_CAPSULE | Freq: Two times a day (BID) | ORAL | 0 refills | Status: DC

## 2023-04-05 NOTE — ED Triage Notes (Signed)
Patient with c/o fatigue, body aches, congestion and cough for 2 days. States she just got home from a cruise.

## 2023-04-05 NOTE — Discharge Instructions (Signed)
You have the flu.  I have prescribed Tamiflu and medications to help with your symptoms.  Follow-up if any symptoms persist or worsen.

## 2023-04-05 NOTE — ED Provider Notes (Signed)
[facility]    CSN: [id number] Arrival date & time: 04/05/23  1450      History   Chief Complaint Chief Complaint  Patient presents with   Cough   Weakness   Generalized Body Aches    HPI Bianca Campbell is a 67 y.o. female.   Patient presents with fatigue, generalized bodyaches, nasal congestion, sore throat, cough that started about 2 days ago.  Patient denies any obvious known sick contacts but reports that she just got back from a cruise in [place].  Temp max at home was 99.  She does not report any medications for symptoms.  Denies chest pain, shortness of breath.  Reports some nausea without vomiting but no diarrhea.  Denies history of asthma or COPD and patient does not smoke cigarettes.   Cough Weakness   Past Medical History:  Diagnosis Date   ALLERGIC RHINITIS 09/11/2007   Anemia, unspecified 08/31/2012   Arthritis    "knees" (11/23/2016)   BACK PAIN    EXTERNAL OTITIS 09/11/2007   GERD 06/06/2007   GOUT 11/14/2007   High cholesterol    HYPERTENSION 06/06/2007   INTERMITTENT VERTIGO 09/11/2007   Irritable bowel syndrome 06/06/2007   LOW BACK PAIN 06/06/2007   OSTEOARTHRITIS 06/06/2007   PONV (postoperative nausea and vomiting)    Primary localized osteoarthritis of right knee    SHOULDER PAIN, RIGHT 12/30/2008    Patient Active Problem List   Diagnosis Date Noted   Cough 11/02/2022   Diabetes (HCC) 07/16/2022   Encounter for well adult exam with abnormal findings 07/14/2022   Chronic cough    Vertigo of central origin 06/20/2018   MRSA carrier 12/13/2016   Primary localized osteoarthritis of right knee    Orthostasis 09/15/2015   Bunion, right foot 12/30/2014   Elevated total protein 09/09/2014   Hyperlipidemia 03/13/2013   Vitamin D deficiency 03/13/2013   Anemia, unspecified 08/31/2012   Gout 11/14/2007   ALLERGIC RHINITIS 09/11/2007   Vertigo 09/11/2007   Essential hypertension 06/06/2007   GERD without esophagitis 06/06/2007    Irritable bowel syndrome 06/06/2007   Osteoarthritis 06/06/2007    Past Surgical History:  Procedure Laterality Date   24 HOUR PH STUDY N/A 05/27/2019   Procedure: 24 HOUR PH STUDY;  Surgeon: Beverley Fiedler, MD;  Location: [facility];  Service: Gastroenterology;  Laterality: N/A;   BILATERAL SALPINGOOPHORECTOMY     CESAREAN SECTION  1987, 1991   COLONOSCOPY     pt stated 2015 or 2016 unknown md   ESOPHAGEAL MANOMETRY N/A 05/27/2019   Procedure: ESOPHAGEAL MANOMETRY (EM) WITH IMPEDENCE STUDY;  Surgeon: Beverley Fiedler, MD;  Location: [facility];  Service: Gastroenterology;  Laterality: N/A;   EXPLORATORY LAPAROTOMY     bowel obstruction   INGUINAL HERNIA REPAIR Bilateral    numerous hernia repairs 1996 to 2011   JOINT REPLACEMENT     KNEE ARTHROSCOPY Bilateral 2013 - 11/11/2015   LAPAROSCOPIC CHOLECYSTECTOMY  1991   s/p incarcerated hernia and ovary cyst Jan 2011  2011   TOTAL KNEE ARTHROPLASTY Right 11/21/2016   Procedure: RIGHT TOTAL KNEE ARTHROPLASTY;  Surgeon: Salvatore Marvel, MD;  Location: [facility] OR;  Service: Orthopedics;  Laterality: Right;   UMBILICAL HERNIA REPAIR     numerous hernia repairs 1996 to 2011    OB History   No obstetric history on file.      Home Medications    Prior to Admission medications   Medication Sig Start Date End Date Taking? Authorizing Provider  benzonatate (TESSALON) 100 MG capsule Take 1 capsule (100 mg total) by mouth every 8 (eight) hours as needed for cough. 04/05/23  Yes Jayden Rudge, Rolly Salter E, FNP  fluticasone (FLONASE) 50 MCG/ACT nasal spray Place 1 spray into both nostrils daily. 04/05/23  Yes Cory Kitt, Rolly Salter E, FNP  ondansetron (ZOFRAN-ODT) 4 MG disintegrating tablet Take 1 tablet (4 mg total) by mouth every 8 (eight) hours as needed for nausea or vomiting. 04/05/23  Yes Gustavus Bryant, FNP  oseltamivir (TAMIFLU) 75 MG capsule Take 1 capsule (75 mg total) by mouth every 12 (twelve) hours. 04/05/23  Yes Erza Mothershead, Rolly Salter E, FNP  acetaminophen (TYLENOL) 500 MG  tablet Take 1,000 mg by mouth every 6 (six) hours as needed for mild pain or headache.    [provider]  amLODipine (NORVASC) 10 MG tablet Take 1 tablet by mouth once daily 09/12/22   [name], MD  amoxicillin-clavulanate (AUGMENTIN) 875-125 MG tablet Take 1 tablet by mouth every 12 (twelve) hours. Patient not taking: Reported on 11/02/2022 03/07/22   Particia Nearing, PA-C  Ascorbic Acid (VITAMIN C PO) Take 1 tablet by mouth daily.    [provider]  aspirin EC 81 MG tablet Take 81 mg by mouth daily. Swallow whole.    [provider]  Cholecalciferol (VITAMIN D3) 5000 units CAPS Take 5,000 Units by mouth daily.    [provider]  Cyanocobalamin (VITAMIN B 12 PO) Take 1 tablet by mouth daily.    [provider]  cyclobenzaprine (FLEXERIL) 5 MG tablet Take 1 tablet (5 mg total) by mouth 3 (three) times daily as needed. 05/02/22   [name], MD  diclofenac (VOLTAREN) 75 MG EC tablet Take 1 tablet (75 mg total) by mouth 2 (two) times daily. 03/09/23   Lenn Sink, DPM  ibuprofen (ADVIL) 800 MG tablet Take 1 tablet (800 mg total) by mouth 3 (three) times daily. 11/20/21   Janell Quiet, PA-C  irbesartan (AVAPRO) 300 MG tablet Take 1 tablet by mouth once daily 10/24/22   [name], MD  meclizine (ANTIVERT) 25 MG tablet Take 1 tablet (25 mg total) by mouth 3 (three) times daily as needed for dizziness. 11/02/22   [name], MD  meloxicam (MOBIC) 15 MG tablet TAKE 1 TABLET BY MOUTH ONCE DAILY AS NEEDED FOR PAIN 09/13/22   [name], MD  Multiple Vitamins-Minerals (ZINC PO) Take 1 tablet by mouth daily.    [provider]  PAXLOVID, 300/100, 20 x 150 MG & 10 x 100MG  TBPK Take 3 tablets by mouth 2 (two) times daily. Patient not taking: Reported on 11/02/2022 10/09/22   [provider]  scopolamine (TRANSDERM-SCOP) 1 MG/3DAYS Place 1 patch (1.5 mg total) onto the skin every 3 (three) days. 03/17/23   [name], MD     Family History Family History  Problem Relation Age of Onset   Arthritis Mother        RA   Diabetes Mother    Seizures Mother    Colon polyps Father 56   Healthy Sister    Healthy Brother    Healthy Son    Healthy Daughter    Asthma Daughter    Colon cancer Neg Hx    Esophageal cancer Neg Hx    Rectal cancer Neg Hx    Stomach cancer Neg Hx     Social History Social History   Tobacco Use   Smoking status: Never   Smokeless tobacco: Never  Vaping Use   Vaping Use: Never used  Substance Use Topics   Alcohol use: No   Drug use: No     Allergies   Latex   Review of Systems Review of Systems Per HPI  Physical Exam Triage Vital Signs ED Triage Vitals  Enc Vitals Group     BP 04/05/23 1506 (!) 143/96     Pulse Rate 04/05/23 1506 88     Resp 04/05/23 1506 18     Temp 04/05/23 1506 98.4 F (36.9 C)     Temp Source 04/05/23 1506 Oral     SpO2 04/05/23 1506 95 %     Weight --      Height --      Head Circumference --      Peak Flow --      Pain Score 04/05/23 1507 8     Pain Loc --      Pain Edu? --      Excl. in GC? --    No data found.  Updated Vital Signs BP (!) 143/96 (BP Location: Left Arm)   Pulse 88   Temp 98.4 F (36.9 C) (Oral)   Resp 18   SpO2 95%   Visual Acuity Right Eye Distance:   Left Eye Distance:   Bilateral Distance:    Right Eye Near:   Left Eye Near:    Bilateral Near:     Physical Exam Constitutional:      General: She is not in acute distress.    Appearance: Normal appearance. She is not toxic-appearing or diaphoretic.  HENT:     Head: Normocephalic and atraumatic.     Right Ear: Tympanic membrane and ear canal normal.     Left Ear: Tympanic membrane and ear canal normal.     Nose: Congestion present.     Mouth/Throat:     Mouth: Mucous membranes are moist.     Pharynx: Posterior oropharyngeal erythema present.  Eyes:     Extraocular Movements: Extraocular movements intact.     Conjunctiva/sclera:  Conjunctivae normal.     Pupils: Pupils are equal, round, and reactive to light.  Cardiovascular:     Rate and Rhythm: Normal rate and regular rhythm.     Pulses: Normal pulses.     Heart sounds: Normal heart sounds.  Pulmonary:     Effort: Pulmonary effort is normal. No respiratory distress.     Breath sounds: Normal breath sounds. No stridor. No wheezing, rhonchi or rales.  Abdominal:     General: Abdomen is flat. Bowel sounds are normal.     Palpations: Abdomen is soft.  Musculoskeletal:        General: Normal range of motion.     Cervical back: Normal range of motion.  Skin:    General: Skin is warm and dry.  Neurological:     General: No focal deficit present.     Mental Status: She is alert and oriented to person, place, and time. Mental status is at baseline.  Psychiatric:        Mood and Affect: Mood normal.        Behavior: Behavior normal.      UC Treatments / Results  Labs (all labs ordered are listed, but only abnormal results are displayed) Labs Reviewed  POCT INFLUENZA A/B - Abnormal; Notable for the following components:      Result Value   Influenza A, POC Positive (*)    All other components within normal limits  POCT  RAPID STREP A (OFFICE) - Normal  CULTURE, GROUP A STREP [facility])    EKG   Radiology No results found.  Procedures Procedures (including critical care time)  Medications Ordered in UC Medications - No data to display  Initial Impression / Assessment and Plan / UC Course  I have reviewed the triage vital signs and the nursing notes.  Pertinent labs & imaging results that were available during my care of the patient were reviewed by me and considered in my medical decision making (see chart for details).     Patient tested positive for influenza A.  Will treat with Tamiflu given patient is inside the treatment window.  Rapid strep is negative.  Throat culture pending.  Patient was sent prescriptions to help alleviate symptoms as  well.  Advised adequate fluid hydration and rest.  Advised follow-up precautions if any symptoms persist or worsen.  Patient verbalized understanding and was agreeable with plan.  Final Clinical Impressions(s) / UC Diagnoses   Final diagnoses:  Influenza A  Sore throat     Discharge Instructions      You have the flu.  I have prescribed Tamiflu and medications to help with your symptoms.  Follow-up if any symptoms persist or worsen.     ED Prescriptions     Medication Sig Dispense Auth. Provider   ondansetron (ZOFRAN-ODT) 4 MG disintegrating tablet Take 1 tablet (4 mg total) by mouth every 8 (eight) hours as needed for nausea or vomiting. 20 tablet [place], [place] E, [place]   benzonatate (TESSALON) 100 MG capsule Take 1 capsule (100 mg total) by mouth every 8 (eight) hours as needed for cough. 21 capsule [place], [place] E, [place]   fluticasone [facility]) 50 MCG/ACT nasal spray Place 1 spray into both nostrils daily. 16 g Ervin Knack E, [place]   oseltamivir (TAMIFLU) 75 MG capsule Take 1 capsule (75 mg total) by mouth every 12 (twelve) hours. 10 capsule Gustavus Bryant, [place]      PDMP not reviewed this encounter.   Gustavus Bryant, [place] 04/05/23 [phone]

## 2023-04-08 LAB — CULTURE, GROUP A STREP (THRC)

## 2023-04-18 ENCOUNTER — Other Ambulatory Visit: Payer: Self-pay

## 2023-04-18 ENCOUNTER — Other Ambulatory Visit: Payer: Self-pay | Admitting: Internal Medicine

## 2023-06-01 ENCOUNTER — Encounter (INDEPENDENT_AMBULATORY_CARE_PROVIDER_SITE_OTHER): Payer: Self-pay

## 2023-06-20 ENCOUNTER — Ambulatory Visit (INDEPENDENT_AMBULATORY_CARE_PROVIDER_SITE_OTHER): Payer: Medicare Other

## 2023-06-20 VITALS — Ht 59.5 in | Wt 231.0 lb

## 2023-06-20 DIAGNOSIS — Z0000 Encounter for general adult medical examination without abnormal findings: Secondary | ICD-10-CM | POA: Diagnosis not present

## 2023-06-20 NOTE — Progress Notes (Addendum)
Subjective:   Bianca Campbell is a 67 y.o. female who presents for an Initial Medicare Annual Wellness Visit.  Visit Complete: Virtual  I connected with  Bianca Campbell on 07/06/23 by a audio enabled telemedicine application and verified that I am speaking with the correct person using two identifiers.  Patient Location: Home  Provider Location: Home Office  I discussed the limitations of evaluation and management by telemedicine. The patient expressed understanding and agreed to proceed.  Vital Signs: Because this visit was a virtual/telehealth visit, some criteria may be missing or patient reported. Any vitals not documented were not able to be obtained and vitals that have been documented are patient reported.    Review of Systems    Cardiac Risk Factors include: advanced age (>59men, >52 women);hypertension;diabetes mellitus;dyslipidemia     Objective:    Today's Vitals   06/20/23 0903  Weight: 231 lb (104.8 kg)  Height: 4' 11.5" (1.511 m)   Body mass index is 45.88 kg/m.     06/20/2023    9:12 AM 12/25/2021    7:45 PM 11/20/2021    5:58 PM 10/19/2021    9:30 PM 01/16/2019   11:30 AM 08/25/2018    2:35 AM 06/13/2017    7:00 AM  Advanced Directives  Does Patient Have a Medical Advance Directive? Yes No No No No No No  Type of Estate agent of [place];Living will        Copy of Healthcare Power of Attorney in Chart? No - copy requested        Would patient like information on creating a medical advance directive?   No - Patient declined No - Patient declined  No - Patient declined     Current Medications (verified) Outpatient Encounter Medications as of 06/20/2023  Medication Sig   acetaminophen (TYLENOL) 500 MG tablet Take 1,000 mg by mouth every 6 (six) hours as needed for mild pain or headache.   amLODipine (NORVASC) 10 MG tablet Take 1 tablet by mouth once daily   Ascorbic Acid (VITAMIN C PO) Take 1 tablet by mouth daily.   aspirin EC 81 MG  tablet Take 81 mg by mouth daily. Swallow whole.   Cholecalciferol (VITAMIN D3) 5000 units CAPS Take 5,000 Units by mouth daily.   Cyanocobalamin (VITAMIN B 12 PO) Take 1 tablet by mouth daily.   cyclobenzaprine (FLEXERIL) 5 MG tablet Take 1 tablet (5 mg total) by mouth 3 (three) times daily as needed.   diclofenac (VOLTAREN) 75 MG EC tablet Take 1 tablet (75 mg total) by mouth 2 (two) times daily.   fluticasone (FLONASE) 50 MCG/ACT nasal spray Place 1 spray into both nostrils daily.   ibuprofen (ADVIL) 800 MG tablet Take 1 tablet (800 mg total) by mouth 3 (three) times daily.   irbesartan (AVAPRO) 300 MG tablet Take 1 tablet by mouth once daily   meclizine (ANTIVERT) 25 MG tablet Take 1 tablet (25 mg total) by mouth 3 (three) times daily as needed for dizziness.   meloxicam (MOBIC) 15 MG tablet TAKE 1 TABLET BY MOUTH ONCE DAILY AS NEEDED FOR PAIN   Multiple Vitamins-Minerals (ZINC PO) Take 1 tablet by mouth daily.   ondansetron (ZOFRAN-ODT) 4 MG disintegrating tablet Take 1 tablet (4 mg total) by mouth every 8 (eight) hours as needed for nausea or vomiting.   amoxicillin-clavulanate (AUGMENTIN) 875-125 MG tablet Take 1 tablet by mouth every 12 (twelve) hours. (Patient not taking: Reported on 06/20/2023)   benzonatate (TESSALON) 100  MG capsule Take 1 capsule (100 mg total) by mouth every 8 (eight) hours as needed for cough. (Patient not taking: Reported on 06/20/2023)   oseltamivir (TAMIFLU) 75 MG capsule Take 1 capsule (75 mg total) by mouth every 12 (twelve) hours. (Patient not taking: Reported on 06/20/2023)   PAXLOVID, 300/100, 20 x 150 MG & 10 x 100MG  TBPK Take 3 tablets by mouth 2 (two) times daily. (Patient not taking: Reported on 06/20/2023)   scopolamine (TRANSDERM-SCOP) 1 MG/3DAYS Place 1 patch (1.5 mg total) onto the skin every 3 (three) days. (Patient not taking: Reported on 06/20/2023)   No facility-administered encounter medications on file as of 06/20/2023.    Allergies (verified) Latex    History: Past Medical History:  Diagnosis Date   ALLERGIC RHINITIS 09/11/2007   Anemia, unspecified 08/31/2012   Arthritis    "knees" (11/23/2016)   BACK PAIN    EXTERNAL OTITIS 09/11/2007   GERD 06/06/2007   GOUT 11/14/2007   High cholesterol    HYPERTENSION 06/06/2007   INTERMITTENT VERTIGO 09/11/2007   Irritable bowel syndrome 06/06/2007   LOW BACK PAIN 06/06/2007   OSTEOARTHRITIS 06/06/2007   PONV (postoperative nausea and vomiting)    Primary localized osteoarthritis of right knee    SHOULDER PAIN, RIGHT 12/30/2008   Past Surgical History:  Procedure Laterality Date   15 HOUR PH STUDY N/A 05/27/2019   Procedure: 24 HOUR PH STUDY;  Surgeon: Beverley Fiedler, MD;  Location: [facility];  Service: Gastroenterology;  Laterality: N/A;   BILATERAL SALPINGOOPHORECTOMY     CESAREAN SECTION  1987, 1991   COLONOSCOPY     pt stated 2015 or 2016 unknown md   ESOPHAGEAL MANOMETRY N/A 05/27/2019   Procedure: ESOPHAGEAL MANOMETRY (EM) WITH IMPEDENCE STUDY;  Surgeon: Beverley Fiedler, MD;  Location: [facility];  Service: Gastroenterology;  Laterality: N/A;   EXPLORATORY LAPAROTOMY     bowel obstruction   INGUINAL HERNIA REPAIR Bilateral    numerous hernia repairs 1996 to 2011   JOINT REPLACEMENT     KNEE ARTHROSCOPY Bilateral 2013 - 11/11/2015   LAPAROSCOPIC CHOLECYSTECTOMY  1991   s/p incarcerated hernia and ovary cyst Jan 2011  2011   TOTAL KNEE ARTHROPLASTY Right 11/21/2016   Procedure: RIGHT TOTAL KNEE ARTHROPLASTY;  Surgeon: Salvatore Marvel, MD;  Location: [facility];  Service: Orthopedics;  Laterality: Right;   UMBILICAL HERNIA REPAIR     numerous hernia repairs 1996 to 2011   Family History  Problem Relation Age of Onset   Arthritis Mother        RA   Diabetes Mother    Seizures Mother    Colon polyps Father 59   Healthy Sister    Healthy Brother    Healthy Son    Healthy Daughter    Asthma Daughter    Colon cancer Neg Hx    Esophageal cancer Neg Hx    Rectal cancer Neg Hx     Stomach cancer Neg Hx    Social History   Socioeconomic History   Marital status: Married    Spouse name: Bianca Campbell   Number of children: 2   Years of education: Not on file   Highest education level: Not on file  Occupational History   Occupation: unemployed recently laid off   Occupation: Retired  Tobacco Use   Smoking status: Never   Smokeless tobacco: Never  Vaping Use   Vaping status: Never Used  Substance and Sexual Activity   Alcohol use: No   Drug use:  No   Sexual activity: Never  Other Topics Concern   Not on file  Social History Narrative   Not on file   Social Determinants of Health   Financial Resource Strain: Low Risk  (06/20/2023)   Overall Financial Resource Strain (CARDIA)    Difficulty of Paying Living Expenses: Not hard at all  Food Insecurity: No Food Insecurity (06/20/2023)   Hunger Vital Sign    Worried About Running Out of Food in the Last Year: Never true    Ran Out of Food in the Last Year: Never true  Transportation Needs: No Transportation Needs (06/20/2023)   PRAPARE - Administrator, Civil Service (Medical): No    Lack of Transportation (Non-Medical): No  Physical Activity: Sufficiently Active (06/20/2023)   Exercise Vital Sign    Days of Exercise per Week: 7 days    Minutes of Exercise per Session: 60 min  Stress: No Stress Concern Present (06/20/2023)   [employer] of Occupational Health - Occupational Stress Questionnaire    Feeling of Stress : Not at all  Social Connections: Moderately Integrated (06/20/2023)   Social Connection and Isolation Panel [NHANES]    Frequency of Communication with Friends and Family: More than three times a week    Frequency of Social Gatherings with Friends and Family: Once a week    Attends Religious Services: More than 4 times per year    Active Member of [employer] or Organizations: No    Attends Engineer, structural: Never    Marital Status: Married    Tobacco Counseling Counseling given:  Not Answered   Clinical Intake:  Pre-visit preparation completed: Yes  Pain : No/denies pain     BMI - recorded: 45.88 Nutritional Status: BMI > 30  Obese Nutritional Risks: None Diabetes: Yes CBG done?: No Did pt. bring in CBG monitor from home?: No  How often do you need to have someone help you when you read instructions, pamphlets, or other written materials from your doctor or pharmacy?: 1 - Never  Interpreter Needed?: No  Information entered by :: Angelika Jerrett, RMA   Activities of Daily Living    06/20/2023    9:05 AM  In your present state of health, do you have any difficulty performing the following activities:  Hearing? 1  Comment Wears hearing aides  Vision? 0  Difficulty concentrating or making decisions? 0  Walking or climbing stairs? 0  Dressing or bathing? 0  Doing errands, shopping? 0  Preparing Food and eating ? N  Using the Toilet? N  In the past six months, have you accidently leaked urine? N  Do you have problems with loss of bowel control? N  Managing your Medications? N  Managing your Finances? N  Housekeeping or managing your Housekeeping? N    Patient Care Team: [name], MD as PCP - [facility] (Ophthalmology)  Indicate any recent Medical Services you may have received from other than [facility] providers in the past year (date may be approximate).     Assessment:   This is a routine wellness examination for Bianca Campbell.  Hearing/Vision screen Hearing Screening - Comments:: Wears hearing aides Vision Screening - Comments:: Wears eyeglasses  Dietary issues and exercise activities discussed:     Goals Addressed               This Visit's Progress     Patient Stated (pt-stated)        She would like to lose  more weight.  Would like to have a Nutrition guide.         Depression Screen    06/20/2023    9:19 AM 11/02/2022    2:55 PM 11/02/2022    2:40 PM 07/14/2022    2:14 PM 07/14/2022    1:45 PM 06/29/2022   10:54 AM  05/02/2022    1:47 PM  PHQ 2/9 Scores  PHQ - 2 Score 0 0 0 1 0 1 0  PHQ- 9 Score 1    1  3     Fall Risk    06/20/2023    9:15 AM 11/02/2022    2:55 PM 11/02/2022    2:40 PM 07/14/2022    2:13 PM 07/14/2022    1:45 PM  Fall Risk   Falls in the past year? 0 0 0 1 1  Number falls in past yr: 0 0 0 0 0  Injury with Fall? 0  0 0 1  Risk for fall due to : No Fall Risks  No Fall Risks    Follow up Falls evaluation completed;Falls prevention discussed  Falls evaluation completed      MEDICARE RISK AT HOME: Medicare Risk at Home Any stairs in or around the home?: No Home free of loose throw rugs in walkways, pet beds, electrical cords, etc?: Yes Adequate lighting in your home to reduce risk of falls?: Yes Life alert?: No Use of a cane, walker or w/c?: No Grab bars in the bathroom?: Yes Shower chair or bench in shower?: No Elevated toilet seat or a handicapped toilet?: Yes  TIMED UP AND GO:  Was the test performed? No    Cognitive Function:        06/20/2023    9:16 AM  6CIT Screen  What Year? 0 points  What month? 0 points  What time? 0 points  Count back from 20 0 points  Months in reverse 0 points  Repeat phrase 0 points  Total Score 0 points    Immunizations Immunization History  Administered Date(s) Administered   Influenza,inj,Quad PF,6+ Mos 09/15/2015, 08/27/2018   PFIZER(Purple Top)SARS-COV-2 Vaccination 12/24/2019, 01/14/2020   PNEUMOCOCCAL CONJUGATE-20 11/02/2022   Td 02/24/2010   Tdap 06/24/2020    TDAP status: Up to date  Flu Vaccine status: Declined, Education has been provided regarding the importance of this vaccine but patient still declined. Advised may receive this vaccine at local pharmacy or Health Dept. Aware to provide a copy of the vaccination record if obtained from local pharmacy or Health Dept. Verbalized acceptance and understanding.  Pneumococcal vaccine status: Up to date  Covid-19 vaccine status: Completed vaccines  Qualifies for  Shingles Vaccine? Yes   Zostavax completed No   Shingrix Completed?: No.    Education has been provided regarding the importance of this vaccine. Patient has been advised to call insurance company to determine out of pocket expense if they have not yet received this vaccine. Advised may also receive vaccine at local pharmacy or Health Dept. Verbalized acceptance and understanding.  Screening Tests Health Maintenance  Topic Date Due   OPHTHALMOLOGY EXAM  Never done   Zoster Vaccines- Shingrix (1 of 2) Never done   DEXA SCAN  Never done   MAMMOGRAM  11/17/2022   HEMOGLOBIN A1C  05/03/2023   INFLUENZA VACCINE  05/18/2023   Diabetic kidney evaluation - eGFR measurement  11/03/2023   Diabetic kidney evaluation - Urine ACR  11/03/2023   FOOT EXAM  11/03/2023   Colonoscopy  12/07/2023  Medicare Annual Wellness (AWV)  06/19/2024   DTaP/Tdap/Td (3 - Td or Tdap) 06/24/2030   Pneumonia Vaccine 85+ Years old  Completed   Hepatitis C Screening  Completed   HPV VACCINES  Aged Out   COVID-19 Vaccine  Discontinued    Health Maintenance  Health Maintenance Due  Topic Date Due   OPHTHALMOLOGY EXAM  Never done   Zoster Vaccines- Shingrix (1 of 2) Never done   DEXA SCAN  Never done   MAMMOGRAM  11/17/2022   HEMOGLOBIN A1C  05/03/2023   INFLUENZA VACCINE  05/18/2023    Colorectal cancer screening: Type of screening: Colonoscopy. Completed 12/06/2013. Repeat every 10 years  Mammogram status: Completed 11/17/2020. Repeat every year  Bone Density status: Ordered N/A. Pt provided with contact info and advised to call to schedule appt.  Patient stated that her OBGYN will refer her out for DEXA.  Lung Cancer Screening: (Low Dose CT Chest recommended if Age 29-80 years, 20 pack-year currently smoking OR have quit w/in 15years.) does not qualify.   Lung Cancer Screening Referral: N/A  Additional Screening:  Hepatitis C Screening: does qualify; Completed 09/15/2015  Vision Screening: Recommended  annual ophthalmology exams for early detection of glaucoma and other disorders of the eye. Is the patient up to date with their annual eye exam?  Yes  Who is the provider or what is the name of the office in which the patient attends annual eye exams? [facility] If pt is not established with a provider, would they like to be referred to a provider to establish care? No .   Dental Screening: Recommended annual dental exams for proper oral hygiene  Diabetic Foot Exam: Diabetic Foot Exam: Completed 11/02/2022  Community Resource Referral / Chronic Care Management: CRR required this visit?  No   CCM required this visit?  No     Plan:     I have personally reviewed and noted the following in the patient's chart:   Medical and social history Use of alcohol, tobacco or illicit drugs  Current medications and supplements including opioid prescriptions. Patient is not currently taking opioid prescriptions. Functional ability and status Nutritional status Physical activity Advanced directives List of other physicians Hospitalizations, surgeries, and ER visits in previous 12 months Vitals Screenings to include cognitive, depression, and falls Referrals and appointments  In addition, I have reviewed and discussed with patient certain preventive protocols, quality metrics, and best practice recommendations. A written personalized care plan for preventive services as well as general preventive health recommendations were provided to patient.     Bianca Campbell L Gearold Wainer, CMA   07/06/2023   After Visit Summary: (MyChart) Due to this being a telephonic visit, the after visit summary with patients personalized plan was offered to patient via MyChart   Nurse Notes: Patient is due for a Flu and Shingrix vaccine, however, patient declines them both.  Patient is also due for her DEXA and mammogram, which she will be calling to schedule them both soon, and that these are referred through her OBGYN.   Patient had a concern about an episode she encountered of not being able to swallow her food.  She explains that her food took a while to go down, as she had to drink something to try and get the food down.  She would like to discuss further during her up coming visit with PCP.

## 2023-06-20 NOTE — Patient Instructions (Signed)
Bianca Campbell , Thank you for taking time to come for your Medicare Wellness Visit. I appreciate your ongoing commitment to your health goals. Please review the following plan we discussed and let me know if I can assist you in the future.   Referrals/Orders/Follow-Ups/Clinician Recommendations: Remember to call and get scheduled for your Mammogram and a Bone Density screening soon.  It was nice talking with you today.  Aim for 30 minutes of exercise or brisk walking, 6-8 glasses of water, and 5 servings of fruits and vegetables each day.   This is a list of the screening recommended for you and due dates:  Health Maintenance  Topic Date Due   Eye exam for diabetics  Never done   Zoster (Shingles) Vaccine (1 of 2) Never done   DEXA scan (bone density measurement)  Never done   Mammogram  11/17/2022   Hemoglobin A1C  05/03/2023   Flu Shot  05/18/2023   Yearly kidney function blood test for diabetes  11/03/2023   Yearly kidney health urinalysis for diabetes  11/03/2023   Complete foot exam   11/03/2023   Colon Cancer Screening  12/07/2023   Medicare Annual Wellness Visit  06/19/2024   DTaP/Tdap/Td vaccine (3 - Td or Tdap) 06/24/2030   Pneumonia Vaccine  Completed   Hepatitis C Screening  Completed   HPV Vaccine  Aged Out   COVID-19 Vaccine  Discontinued    Advanced directives: (Copy Requested) Please bring a copy of your health care power of attorney and living will to the office to be added to your chart at your convenience.  Next Medicare Annual Wellness Visit scheduled for next year: Yes

## 2023-07-10 ENCOUNTER — Encounter: Payer: Self-pay | Admitting: Podiatry

## 2023-07-10 ENCOUNTER — Ambulatory Visit: Payer: Medicare Other | Admitting: Podiatry

## 2023-07-10 ENCOUNTER — Ambulatory Visit (INDEPENDENT_AMBULATORY_CARE_PROVIDER_SITE_OTHER): Payer: Medicare Other

## 2023-07-10 VITALS — BP 138/88 | HR 90 | Temp 97.8°F | Resp 18 | Ht 59.5 in | Wt 230.0 lb

## 2023-07-10 DIAGNOSIS — M7662 Achilles tendinitis, left leg: Secondary | ICD-10-CM

## 2023-07-10 DIAGNOSIS — M205X1 Other deformities of toe(s) (acquired), right foot: Secondary | ICD-10-CM

## 2023-07-10 DIAGNOSIS — M7751 Other enthesopathy of right foot: Secondary | ICD-10-CM | POA: Diagnosis not present

## 2023-07-10 MED ORDER — TRIAMCINOLONE ACETONIDE 10 MG/ML IJ SUSP
10.0000 mg | Freq: Once | INTRAMUSCULAR | Status: AC
Start: 2023-07-10 — End: 2023-07-10
  Administered 2023-07-10: 10 mg via INTRA_ARTICULAR

## 2023-07-10 NOTE — Progress Notes (Signed)
Subjective:   Patient ID: Bianca Campbell, female   DOB: 67 y.o.   MRN: [id number]   HPI Patient presents with a lot of pain on the other side of the Achilles tendon left and a lot of pain in the big toe joint right with this being a new condition and hurting her for around 2 months   ROS      Objective:  Physical Exam  Neurovascular status intact inflammation of the Achilles tendon left medial side with a central lateral doing well and on the right there is significant loss of motion first MPJ arthritis inflammation fluid around the big toe joint right.  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  Today I did go ahead for the right I did do a periarticular injection 3 mg Kenalog 5 mg Xylocaine for fluid buildup and I want to see the results of this before making a long-term decision.  Left I did discuss injection she understands risk it is the opposite side which I think will be helpful and she does understand that eventually she could require surgery of this but we are desperately trying to avoid it.  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

## 2023-07-17 ENCOUNTER — Other Ambulatory Visit: Payer: Self-pay | Admitting: Internal Medicine

## 2023-07-17 ENCOUNTER — Ambulatory Visit: Payer: Medicare Other | Admitting: Internal Medicine

## 2023-07-17 ENCOUNTER — Encounter: Payer: Self-pay | Admitting: Internal Medicine

## 2023-07-17 VITALS — BP 128/82 | HR 65 | Temp 98.2°F | Ht 59.5 in | Wt 232.0 lb

## 2023-07-17 DIAGNOSIS — R9431 Abnormal electrocardiogram [ECG] [EKG]: Secondary | ICD-10-CM

## 2023-07-17 DIAGNOSIS — E559 Vitamin D deficiency, unspecified: Secondary | ICD-10-CM | POA: Diagnosis not present

## 2023-07-17 DIAGNOSIS — I10 Essential (primary) hypertension: Secondary | ICD-10-CM

## 2023-07-17 DIAGNOSIS — E7800 Pure hypercholesterolemia, unspecified: Secondary | ICD-10-CM | POA: Diagnosis not present

## 2023-07-17 DIAGNOSIS — E1165 Type 2 diabetes mellitus with hyperglycemia: Secondary | ICD-10-CM

## 2023-07-17 DIAGNOSIS — Z7984 Long term (current) use of oral hypoglycemic drugs: Secondary | ICD-10-CM

## 2023-07-17 LAB — LIPID PANEL
Cholesterol: 209 mg/dL — ABNORMAL HIGH (ref 0–200)
HDL: 91.4 mg/dL (ref >39.00)
LDL Cholesterol: 108 mg/dL — ABNORMAL HIGH (ref 0–99)
NonHDL: 117.39
Total CHOL/HDL Ratio: 2
Triglycerides: 47 mg/dL (ref 0.0–149.0)
VLDL: 9.4 mg/dL (ref 0.0–40.0)

## 2023-07-17 LAB — BASIC METABOLIC PANEL
BUN: 10 mg/dL (ref 6–23)
CO2: 28 meq/L (ref 19–32)
Calcium: 10 mg/dL (ref 8.4–10.5)
Chloride: 102 meq/L (ref 96–112)
Creatinine, Ser: 0.54 mg/dL (ref 0.40–1.20)
GFR: 96.02 mL/min (ref >60.00)
Glucose, Bld: 105 mg/dL — ABNORMAL HIGH (ref 70–99)
Potassium: 4.1 meq/L (ref 3.5–5.1)
Sodium: 139 meq/L (ref 135–145)

## 2023-07-17 LAB — HEPATIC FUNCTION PANEL
ALT: 17 U/L (ref 0–35)
AST: 16 U/L (ref 0–37)
Albumin: 4.4 g/dL (ref 3.5–5.2)
Alkaline Phosphatase: 101 U/L (ref 39–117)
Bilirubin, Direct: 0.1 mg/dL (ref 0.0–0.3)
Total Bilirubin: 1.1 mg/dL (ref 0.2–1.2)
Total Protein: 8.5 g/dL — ABNORMAL HIGH (ref 6.0–8.3)

## 2023-07-17 LAB — HEMOGLOBIN A1C: Hgb A1c MFr Bld: 7.2 % — ABNORMAL HIGH (ref 4.6–6.5)

## 2023-07-17 MED ORDER — ROSUVASTATIN CALCIUM 10 MG PO TABS
10.0000 mg | ORAL_TABLET | Freq: Every day | ORAL | 3 refills | Status: DC
Start: 2023-07-17 — End: 2024-06-25

## 2023-07-17 MED ORDER — CYCLOBENZAPRINE HCL 5 MG PO TABS: 5.0000 mg | ORAL_TABLET | Freq: Three times a day (TID) | ORAL | 2 refills | Status: DC | PRN

## 2023-07-17 MED ORDER — METFORMIN HCL ER 500 MG PO TB24: 500.0000 mg | ORAL_TABLET | Freq: Every day | ORAL | 3 refills | Status: DC

## 2023-07-17 NOTE — Assessment & Plan Note (Signed)
Last vitamin D Lab Results  Component Value Date   VD25OH 30.02 11/02/2022   Low, to start oral replacement

## 2023-07-17 NOTE — Assessment & Plan Note (Signed)
BP Readings from Last 3 Encounters:  07/17/23 128/82  07/10/23 138/88  04/05/23 (!) 143/96   Stable, pt to continue medical treatment norvasc 10 every day, avapro 300 qd

## 2023-07-17 NOTE — Patient Instructions (Addendum)
Please continue all other medications as before, and refills have been done if requested.  Please have the pharmacy call with any other refills you may need.  Please continue your efforts at being more active, low cholesterol diet, and weight control..  Please keep your appointments with your specialists as you may have planned  You will be contacted regarding the referral for: Cardiac CT score, nutrition  Please go to the LAB at the blood drawing area for the tests to be done  You will be contacted by phone if any changes need to be made immediately.  Otherwise, you will receive a letter about your results with an explanation, but please check with MyChart first.  Please make an Appointment to return in 6 months, or sooner if needed

## 2023-07-17 NOTE — Assessment & Plan Note (Addendum)
Lab Results  Component Value Date   HGBA1C 7.2 (H) 07/17/2023   Uncontrolled, goal A1c < 7, pt not sure if wants to start mounjaro for wt loss also, ok for metformin ER 500 mg qd, also for nutrition referral

## 2023-07-17 NOTE — Assessment & Plan Note (Addendum)
Lab Results  Component Value Date   LDLCALC 108 (H) 07/17/2023   Uncontrolled, goal ldl < 70, pt for start crestor 10 every day, also for card CT score

## 2023-07-17 NOTE — Progress Notes (Signed)
Patient ID: Bianca Campbell, female   DOB: 1956/09/03, 67 y.o.   MRN: [id number]        Chief Complaint: follow up HTN, HLD and DM, dysphagia       HPI:  CECYLIA Campbell is a 67 y.o. female here with c/o 1 episode dysphagia to upper chest area with solid food, had to force vomit it up as would not go down last wk.  No symptoms since then to solids or liquids.  Denies worsening reflux, abd pain, n/v, bowel change or blood. Pt denies chest pain, increased sob or doe, wheezing, orthopnea, PND, increased LE swelling, palpitations, dizziness or syncope.   Pt denies polydipsia, polyuria, or new focal neuro s/s.    Pt denies fever, wt loss, night sweats, loss of appetite, or other constitutional symptoms         Wt Readings from Last 3 Encounters:  07/17/23 232 lb (105.2 kg)  07/10/23 230 lb (104.3 kg)  06/20/23 231 lb (104.8 kg)   BP Readings from Last 3 Encounters:  07/17/23 128/82  07/10/23 138/88  04/05/23 (!) 143/96         Past Medical History:  Diagnosis Date   ALLERGIC RHINITIS 09/11/2007   Anemia, unspecified 08/31/2012   Arthritis    "knees" (11/23/2016)   BACK PAIN    EXTERNAL OTITIS 09/11/2007   GERD 06/06/2007   GOUT 11/14/2007   High cholesterol    HYPERTENSION 06/06/2007   INTERMITTENT VERTIGO 09/11/2007   Irritable bowel syndrome 06/06/2007   LOW BACK PAIN 06/06/2007   OSTEOARTHRITIS 06/06/2007   PONV (postoperative nausea and vomiting)    Primary localized osteoarthritis of right knee    SHOULDER PAIN, RIGHT 12/30/2008   Past Surgical History:  Procedure Laterality Date   23 HOUR PH STUDY N/A 05/27/2019   Procedure: 24 HOUR PH STUDY;  Surgeon: Beverley Fiedler, MD;  Location: [facility];  Service: Gastroenterology;  Laterality: N/A;   BILATERAL SALPINGOOPHORECTOMY     CESAREAN SECTION  1987, 1991   COLONOSCOPY     pt stated 2015 or 2016 unknown md   ESOPHAGEAL MANOMETRY N/A 05/27/2019   Procedure: ESOPHAGEAL MANOMETRY (EM) WITH IMPEDENCE STUDY;  Surgeon: Beverley Fiedler,  MD;  Location: [facility];  Service: Gastroenterology;  Laterality: N/A;   EXPLORATORY LAPAROTOMY     bowel obstruction   INGUINAL HERNIA REPAIR Bilateral    numerous hernia repairs 1996 to 2011   JOINT REPLACEMENT     KNEE ARTHROSCOPY Bilateral 2013 - 11/11/2015   LAPAROSCOPIC CHOLECYSTECTOMY  1991   s/p incarcerated hernia and ovary cyst Jan 2011  2011   TOTAL KNEE ARTHROPLASTY Right 11/21/2016   Procedure: RIGHT TOTAL KNEE ARTHROPLASTY;  Surgeon: Salvatore Marvel, MD;  [facility];  Service: Orthopedics;  Laterality: Right;   UMBILICAL HERNIA REPAIR     numerous hernia repairs 1996 to 2011    reports that she has never smoked. She has never used smokeless tobacco. She reports that she does not drink alcohol and does not use drugs. family history includes Arthritis in her mother; Asthma in her daughter; Colon polyps (age of onset: 66) in her father; Diabetes in her mother; Healthy in her brother, daughter, sister, and son; Seizures in her mother. Allergies  Allergen Reactions   Latex Itching   Current Outpatient Medications on File Prior to Visit  Medication Sig Dispense Refill   acetaminophen (TYLENOL) 500 MG tablet Take 1,000 mg by mouth every 6 (six) hours as needed for mild  pain or headache.     amLODipine (NORVASC) 10 MG tablet Take 1 tablet by mouth once daily 90 tablet 1   Ascorbic Acid (VITAMIN C PO) Take 1 tablet by mouth daily.     Cholecalciferol (VITAMIN D3) 5000 units CAPS Take 5,000 Units by mouth daily.     Cyanocobalamin (VITAMIN B 12 PO) Take 1 tablet by mouth daily.     fluticasone (FLONASE) 50 MCG/ACT nasal spray Place 1 spray into both nostrils daily. 16 g 0   irbesartan (AVAPRO) 300 MG tablet Take 1 tablet by mouth once daily 90 tablet 0   meclizine (ANTIVERT) 25 MG tablet Take 1 tablet (25 mg total) by mouth 3 (three) times daily as needed for dizziness. 30 tablet 5   meloxicam (MOBIC) 15 MG tablet TAKE 1 TABLET BY MOUTH ONCE DAILY AS NEEDED FOR PAIN 90 tablet 3    ondansetron (ZOFRAN-ODT) 4 MG disintegrating tablet Take 1 tablet (4 mg total) by mouth every 8 (eight) hours as needed for nausea or vomiting. 20 tablet 0   aspirin EC 81 MG tablet Take 81 mg by mouth daily. Swallow whole. (Patient not taking: Reported on 07/17/2023)     diclofenac (VOLTAREN) 75 MG EC tablet Take 1 tablet (75 mg total) by mouth 2 (two) times daily. (Patient not taking: Reported on 07/17/2023) 50 tablet 2   ibuprofen (ADVIL) 800 MG tablet Take 1 tablet (800 mg total) by mouth 3 (three) times daily. (Patient not taking: Reported on 07/17/2023) 30 tablet 0   Multiple Vitamins-Minerals (ZINC PO) Take 1 tablet by mouth daily. (Patient not taking: Reported on 07/17/2023)     No current facility-administered medications on file prior to visit.        ROS:  All others reviewed and negative.  Objective        PE:  BP 128/82 (BP Location: Right Arm, Patient Position: Sitting, Cuff Size: Normal)   Pulse 65   Temp 98.2 F (36.8 C) (Oral)   Ht 4' 11.5" (1.511 m)   Wt 232 lb (105.2 kg)   SpO2 95%   BMI 46.07 kg/m                 Constitutional: Pt appears in NAD               HENT: Head: NCAT.                Right Ear: External ear normal.                 Left Ear: External ear normal.                Eyes: . Pupils are equal, round, and reactive to light. Conjunctivae and EOM are normal               Nose: without d/c or deformity               Neck: Neck supple. Gross normal ROM               Cardiovascular: Normal rate and regular rhythm.                 Pulmonary/Chest: Effort normal and breath sounds without rales or wheezing.                Abd:  Soft, NT, ND, + BS, no organomegaly               Neurological: Pt is alert. At  baseline orientation, motor grossly intact               Skin: Skin is warm. No rashes, no other new lesions, LE edema - none               Psychiatric: Pt behavior is normal without agitation   Micro: none  Cardiac tracings I have personally  interpreted today:  none  Pertinent Radiological findings (summarize): none   Lab Results  Component Value Date   WBC 7.0 11/02/2022   HGB 12.6 11/02/2022   HCT 37.8 11/02/2022   PLT 336.0 11/02/2022   GLUCOSE 105 (H) 07/17/2023   CHOL 209 (H) 07/17/2023   TRIG 47.0 07/17/2023   HDL 91.40 07/17/2023   LDLDIRECT 114.4 03/13/2013   LDLCALC 108 (H) 07/17/2023   ALT 17 07/17/2023   AST 16 07/17/2023   NA 139 07/17/2023   K 4.1 07/17/2023   CL 102 07/17/2023   CREATININE 0.54 07/17/2023   BUN 10 07/17/2023   CO2 28 07/17/2023   TSH 1.82 11/02/2022   INR 1.06 11/10/2016   HGBA1C 7.2 (H) 07/17/2023   MICROALBUR 2.5 (H) 11/02/2022   Assessment/Plan:  GAYLIN OSORIA is a 67 y.o. Black or African American [2] female with  has a past medical history of ALLERGIC RHINITIS (09/11/2007), Anemia, unspecified (08/31/2012), Arthritis, BACK PAIN, EXTERNAL OTITIS (09/11/2007), GERD (06/06/2007), GOUT (11/14/2007), High cholesterol, HYPERTENSION (06/06/2007), INTERMITTENT VERTIGO (09/11/2007), Irritable bowel syndrome (06/06/2007), LOW BACK PAIN (06/06/2007), OSTEOARTHRITIS (06/06/2007), PONV (postoperative nausea and vomiting), Primary localized osteoarthritis of right knee, and SHOULDER PAIN, RIGHT (12/30/2008).  Diabetes (HCC) Lab Results  Component Value Date   HGBA1C 7.2 (H) 07/17/2023   Uncontrolled, goal A1c < 7, pt not sure if wants to start mounjaro for wt loss also, ok for metformin ER 500 mg qd, also for nutrition referral   Essential hypertension BP Readings from Last 3 Encounters:  07/17/23 128/82  07/10/23 138/88  04/05/23 (!) 143/96   Stable, pt to continue medical treatment norvasc 10 every day, avapro 300 qd   Hyperlipidemia Lab Results  Component Value Date   LDLCALC 108 (H) 07/17/2023   Uncontrolled, goal ldl < 70, pt for start crestor 10 every day, also for card CT score   Vitamin D deficiency Last vitamin D Lab Results  Component Value Date   VD25OH 30.02  11/02/2022   Low, to start oral replacement  Followup: Return in about 6 months (around 01/14/2024).  Oliver Barre, MD 07/17/2023 8:46 PM [facility]

## 2023-07-31 ENCOUNTER — Encounter: Payer: Self-pay | Admitting: Podiatry

## 2023-07-31 ENCOUNTER — Ambulatory Visit: Payer: Medicare Other | Admitting: Podiatry

## 2023-07-31 DIAGNOSIS — M7662 Achilles tendinitis, left leg: Secondary | ICD-10-CM | POA: Diagnosis not present

## 2023-07-31 DIAGNOSIS — M205X1 Other deformities of toe(s) (acquired), right foot: Secondary | ICD-10-CM | POA: Diagnosis not present

## 2023-07-31 NOTE — Progress Notes (Signed)
Subjective:   Patient ID: Bianca Campbell, female   DOB: 67 y.o.   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

## 2023-08-07 ENCOUNTER — Ambulatory Visit (HOSPITAL_COMMUNITY)
Admission: RE | Admit: 2023-08-07 | Discharge: 2023-08-07 | Disposition: A | Discharge status: Home / Self Care | Payer: Medicare Other | Source: Ambulatory Visit | Attending: Internal Medicine | Admitting: Internal Medicine

## 2023-08-07 DIAGNOSIS — E7800 Pure hypercholesterolemia, unspecified: Secondary | ICD-10-CM | POA: Insufficient documentation

## 2023-08-07 DIAGNOSIS — I10 Essential (primary) hypertension: Secondary | ICD-10-CM | POA: Insufficient documentation

## 2023-08-07 DIAGNOSIS — R9431 Abnormal electrocardiogram [ECG] [EKG]: Secondary | ICD-10-CM | POA: Insufficient documentation

## 2023-08-07 DIAGNOSIS — E1165 Type 2 diabetes mellitus with hyperglycemia: Secondary | ICD-10-CM | POA: Insufficient documentation

## 2023-08-14 ENCOUNTER — Other Ambulatory Visit: Payer: Self-pay

## 2023-08-14 ENCOUNTER — Other Ambulatory Visit: Payer: Self-pay | Admitting: Internal Medicine

## 2023-08-14 ENCOUNTER — Telehealth: Payer: Self-pay | Admitting: Internal Medicine

## 2023-08-14 NOTE — Telephone Encounter (Signed)
Ok this is noted for now

## 2023-08-14 NOTE — Telephone Encounter (Signed)
FYI:  Patient told [employer] that she is not taking the metformin or the resuvastatin

## 2023-08-15 ENCOUNTER — Encounter: Payer: Self-pay | Admitting: Dietician

## 2023-08-15 ENCOUNTER — Encounter: Payer: Medicare Other | Attending: Internal Medicine | Admitting: Dietician

## 2023-08-15 DIAGNOSIS — E119 Type 2 diabetes mellitus without complications: Secondary | ICD-10-CM | POA: Insufficient documentation

## 2023-08-15 NOTE — Progress Notes (Signed)
Patient was seen on 08/15/23 for the first of a series of three diabetes self-management courses at the [facility].  Patient Education Plan per assessed needs and concerns is to attend three course education program for Diabetes Self Management Education.  A1C was 7.2 on 07/17/23.  The following learning objectives were met by the patient during this class: Describe diabetes, types of diabetes and pathophysiology State some common risk factors for diabetes Defines the role of glucose and insulin Describe the relationship between diabetes and cardiovascular and other risks State the members of the Healthcare Team States the rationale for glucose monitoring and when to test State their individual Target Range State the importance of logging glucose readings and how to interpret the readings Identifies A1C target Explain the correlation between A1c and eAG values State symptoms and treatment of high blood glucose and low blood glucose Explain proper technique for glucose testing and identify proper sharps disposal  Handouts given during class include: How to Thrive:  A Guide for Your Journey with Diabetes by the ADA Meal Plan Card and carbohydrate content list Dietary intake form Low Sodium Flavoring Tips Types of Fats Dining Out Label reading Snack list The diabetes portion plate Diabetes Resources A1c to eAG Conversion Chart Blood Glucose Log Diabetes Recommended Care Schedule Support Group Diabetes Success Plan Core Class Satisfaction Survey   Follow-Up Plan: Attend core 2

## 2023-08-22 ENCOUNTER — Encounter: Payer: Self-pay | Admitting: Dietician

## 2023-08-22 ENCOUNTER — Encounter: Payer: Medicare Other | Attending: Internal Medicine | Admitting: Dietician

## 2023-08-22 DIAGNOSIS — E1165 Type 2 diabetes mellitus with hyperglycemia: Secondary | ICD-10-CM | POA: Diagnosis present

## 2023-08-22 DIAGNOSIS — Z713 Dietary counseling and surveillance: Secondary | ICD-10-CM | POA: Insufficient documentation

## 2023-08-22 NOTE — Progress Notes (Signed)
Patient was seen on 08/22/23 for the second of a series of three diabetes self-management courses at the [facility]. The following learning objectives were met by the patient during this class:  Describe the role of different macronutrients on glucose Explain how carbohydrates affect blood glucose State what foods contain the most carbohydrates Demonstrate carbohydrate counting Demonstrate how to read Nutrition Facts food label Describe effects of various fats on heart health Describe the importance of good nutrition for health and healthy eating strategies Describe techniques for managing your shopping, cooking and meal planning List strategies to follow meal plan when dining out Describe the effects of alcohol on glucose and how to use it safely  Goals:  Follow Diabetes Meal Plan as instructed  Aim to spread carbs evenly throughout the day  Aim for 3 meals per day and snacks as needed Include lean protein foods to meals/snacks  Monitor glucose levels as instructed by your doctor   Follow-Up Plan: Attend Core 3 Work towards following your personal food plan.

## 2023-08-29 ENCOUNTER — Encounter: Payer: Medicare Other | Attending: Internal Medicine | Admitting: Dietician

## 2023-08-29 ENCOUNTER — Encounter: Payer: Self-pay | Admitting: Internal Medicine

## 2023-08-29 ENCOUNTER — Encounter: Payer: Self-pay | Admitting: Dietician

## 2023-08-29 DIAGNOSIS — Z713 Dietary counseling and surveillance: Secondary | ICD-10-CM | POA: Diagnosis not present

## 2023-08-29 DIAGNOSIS — E119 Type 2 diabetes mellitus without complications: Secondary | ICD-10-CM | POA: Insufficient documentation

## 2023-08-29 DIAGNOSIS — R931 Abnormal findings on diagnostic imaging of heart and coronary circulation: Secondary | ICD-10-CM | POA: Insufficient documentation

## 2023-08-29 DIAGNOSIS — E1165 Type 2 diabetes mellitus with hyperglycemia: Secondary | ICD-10-CM

## 2023-08-29 NOTE — Progress Notes (Signed)
Patient was seen on 08/29/2023 for the third of a series of three diabetes self-management courses at the [facility].   State the amount of activity recommended for healthy living Describe activities suitable for individual needs Identify ways to regularly incorporate activity into daily life Identify barriers to activity and ways to over come these barriers Identify diabetes medications being personally used and their primary action for lowering glucose and possible side effects Describe role of stress on blood glucose and develop strategies to address psychosocial issues Identify diabetes complications and ways to prevent them Explain how to manage diabetes during illness Evaluate success in meeting personal goal Establish 2-3 goals that they will plan to diligently work on  Goals:  I will count my carb choices at most meals and snacks I will be active 30 minutes or more 6 times a week I will take my diabetes medications as scheduled I will eat less unhealthy fats by eating less fatty meats. I will test my glucose at least 3 times a day, 7 days a week I will look at patterns in my record book at least 4 days a month To help manage stress I will exercise at least 5 times a week  Your patient has identified these potential barriers to change:  Motivation Finances Stress Lack of Family Support  Your patient has identified their diabetes self-care support plan as  Family Education officer, environmental Resources [facility] Support Group  American Diabetes Association Website    Plan:  Attend Support Group as desired

## 2023-08-31 ENCOUNTER — Encounter: Payer: Self-pay | Admitting: Internal Medicine

## 2023-10-02 ENCOUNTER — Encounter: Payer: Self-pay | Admitting: Internal Medicine

## 2023-10-02 NOTE — Telephone Encounter (Signed)
 Care team updated and letter sent for eye exam notes.

## 2023-10-12 ENCOUNTER — Other Ambulatory Visit: Payer: Self-pay | Admitting: Internal Medicine

## 2023-10-13 ENCOUNTER — Other Ambulatory Visit: Payer: Self-pay

## 2023-10-19 ENCOUNTER — Other Ambulatory Visit: Payer: Self-pay | Admitting: Internal Medicine

## 2023-10-19 ENCOUNTER — Other Ambulatory Visit: Payer: Self-pay

## 2023-10-26 ENCOUNTER — Ambulatory Visit: Payer: Medicare Other | Admitting: Dietician

## 2023-10-30 ENCOUNTER — Encounter: Payer: Medicare Other | Attending: Internal Medicine | Admitting: Dietician

## 2023-10-30 ENCOUNTER — Encounter: Payer: Self-pay | Admitting: Dietician

## 2023-10-30 DIAGNOSIS — E119 Type 2 diabetes mellitus without complications: Secondary | ICD-10-CM

## 2023-10-30 NOTE — Progress Notes (Signed)
 Appointment start:  1400 Appointment End:  1433  Patient attended Diabetes Core Classes 1-3 between 08/15/2023 and 08/29/2023 at [facility]. The purpose of the meeting today is to review information learned during those classes as well as review patient application and goals.   What are one or two positive things that you are doing right now to manage your diabetes?  Exercising most every day - goes to the [facility] and does 1 hour class and walks 3 laps or does another class.  Tries to eat earlier in the day (before 4 if able) until recently.  What is the hardest part about your diabetes right now, causing you the most concern, or is the most worrisome to you about your diabetes?  Accepting it  My goal is to reverse it.  What questions do you have today?  none  Have you participated in any diabetes support group?  no  History:  Type 2 Diabetes, HTN, HLD A1C:  7.2% 07/17/2023 Medications include:  Metformin  (never started - wants to do natural), Vitamin B-12, Vitamin C, Vitamin D , MVI, gluco capsules Sleep:  good Weight:   231 lbs 10/29/2022 She notes that her body composition has changed since starting exercise.  And she is able to get up and down the steps better with less shortness of breath and more strength. Blood Glucose:  Fasting:  115-149  2 hours after starting a meal:  134-143  Have you had any low blood sugar readings in the past month?  0  Social History:  Patient lives with her husband.  She is retired.  Exercise:  Exercising most every day - goes to the [facility] and does 1 hour class and walks 3 laps or does another class. She notes that her body composition has changed since starting exercise.  And she is able to get up and down the steps better with less shortness of breath and more strength.  24 hour diet recall: Snack:  3 [employer] crackers Breakfast:  grits, [employer] English muffin, 2 scrambled eggs, 1 cup coffee with stevia and regular sweetened  creamer Snack:  Lunch:  none Snack:  none Dinner:  4:30 pm [employer] 2 piece fried chicken, dirty rice, pinto beans but usually cooks a balanced meal Snack: waffle sticks (1/2 portion) Beverages:  water , occasional 10 oz glass juice, occasional coffee with stevia and creamer, hot green tea   Specific focus but not limited to the following: Review of blood glucose monitoring and interpretation including the recommended target ranges and Hgb A1c.  Review of carbohydrate counting, importance of regularly scheduled meals/snacks, and meal planning to improve quality of diet. Review of the effects of physical activity on glucose levels and long-term glucose control.  Recommended goal of 150 minutes of physical activity/week. Review of patient medications and discussed role of medication on blood glucose and possible side effects. Discussion of strategies to manage stress, psychosocial issues, and other obstacles to diabetes management. Review of short-term complications: hyper- and hypo-glycemia (causes, symptoms, and treatment options) Review of prevention, detection, and treatment of long-term complications.  Discussion of the role of prolonged elevated glucose levels on body systems.  Continuing Goals: Drink beverages with 0-5 grams carbohydrates per serving.  Limit/Avoid juice Continue to stay active  Great job on changes made!  Future Follow up:  prn

## 2023-10-30 NOTE — Patient Instructions (Addendum)
 Drink beverages with 0-5 grams carbohydrates per serving.  Limit/Avoid juice Continue to stay active  Great job on changes made!

## 2024-01-12 ENCOUNTER — Telehealth: Payer: Self-pay | Admitting: Internal Medicine

## 2024-01-12 NOTE — Telephone Encounter (Signed)
 Copied from CRM [id number]. Topic: Clinical - Medical Advice >> Jan 11, 2024  3:03 PM Sim Boast F wrote: Reason for CRM: Patient request call back regarding information on receiving a diabetic supply for checking glucose. Says is she doesn't answer to leave a detailed message with call back number since she gets so many spam calls

## 2024-01-17 NOTE — Telephone Encounter (Signed)
 Sorry, I dont understand the question and I am not sure she is requesting anything.

## 2024-01-18 IMAGING — MR MR SHOULDER*R* W/O CM
4 of 5 series · 17 of 40 positions shown · non-contrast
Comparison: Right shoulder radiograph 11/20/2021

CLINICAL DATA: Right shoulder pain

EXAM:
MRI OF THE RIGHT SHOULDER WITHOUT CONTRAST
TECHNIQUE: Multiplanar, multisequence MR imaging of the shoulder was performed.
No intravenous contrast was administered.

[Series 7: T2 fat-sat · oblique · right · 4.0mm · 0.22mm/px · 4 of 21 slices shown (1 of 3)]
[im 1/21]
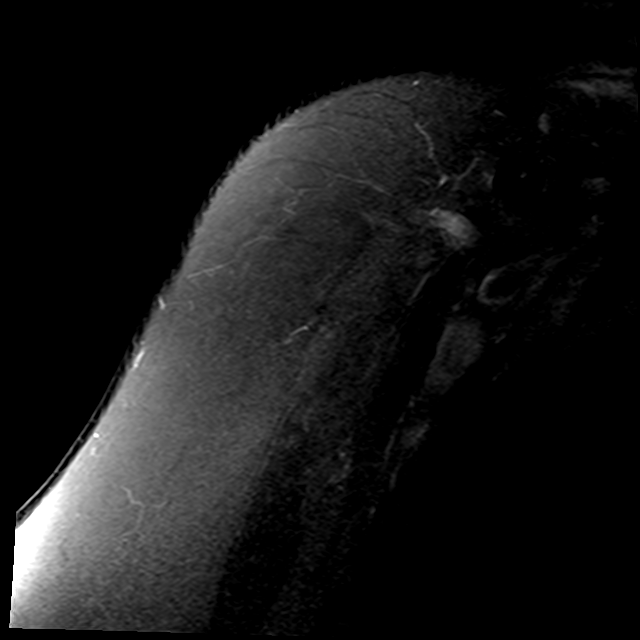
[im 3/21]
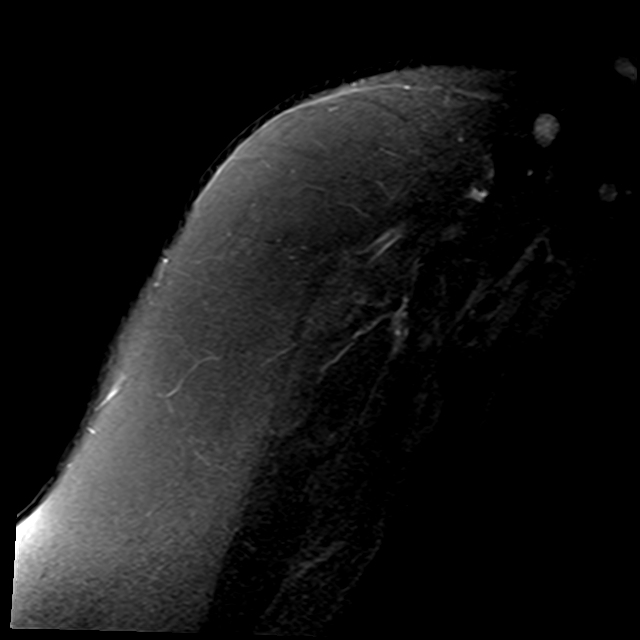
[im 12/21]
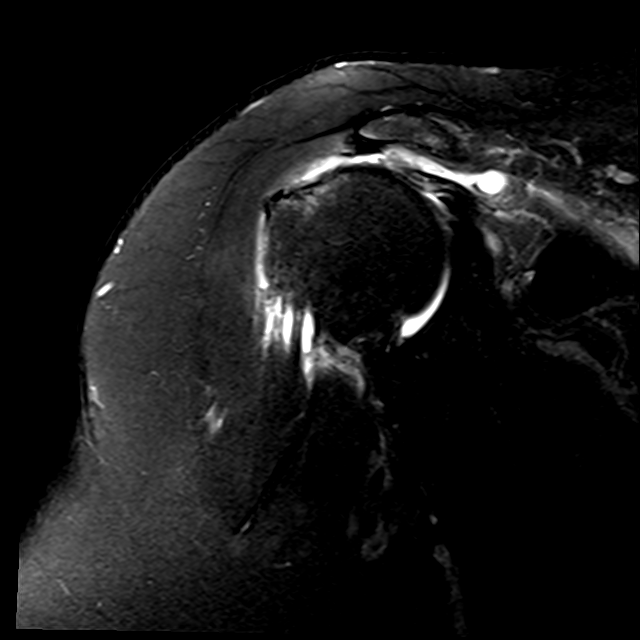
[im 18/21]
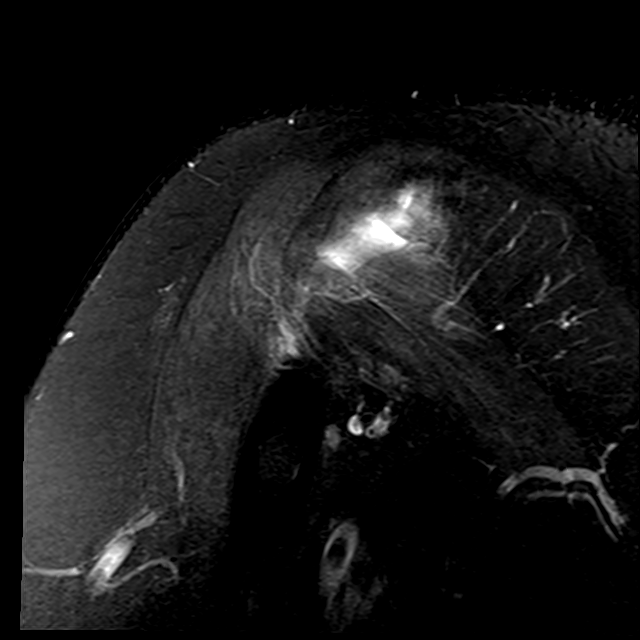

[Series 8: PD · oblique · right · 4.0mm · 0.22mm/px · 7 of 21 slices shown]
[im 1/21]
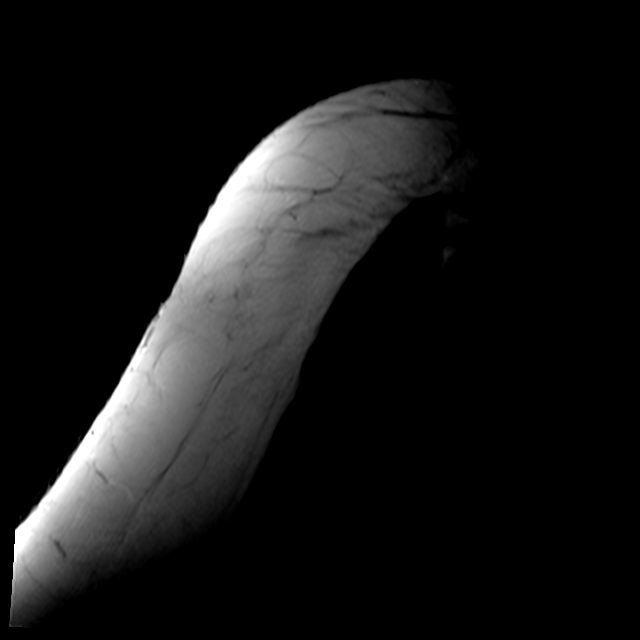
[im 4/21]
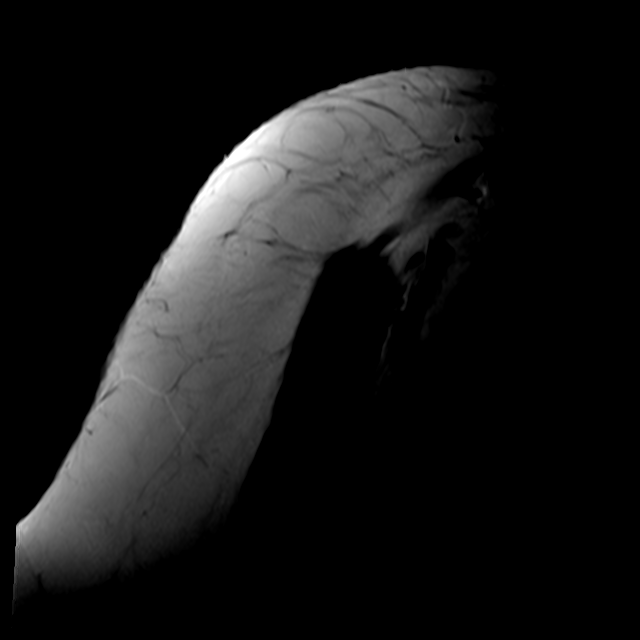
[im 7/21]
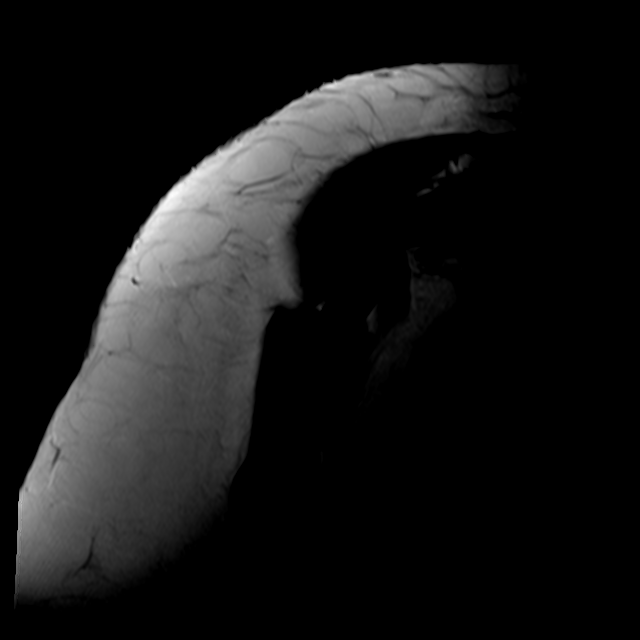
[im 11/21]
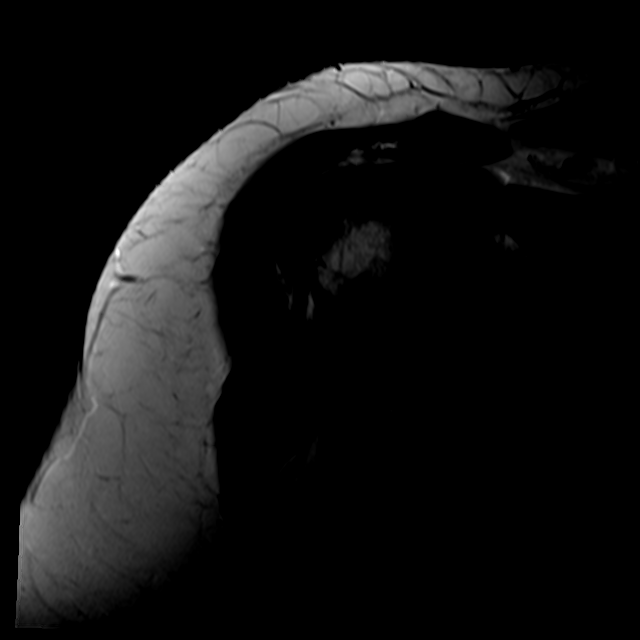
[im 14/21]
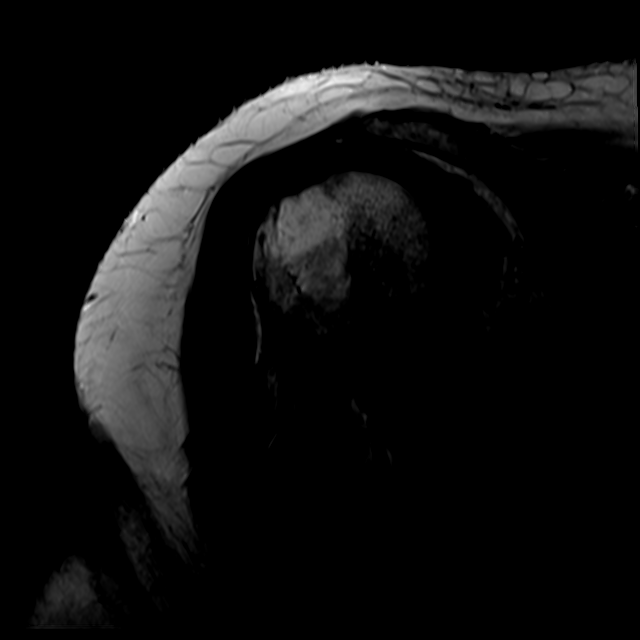
[im 17/21]
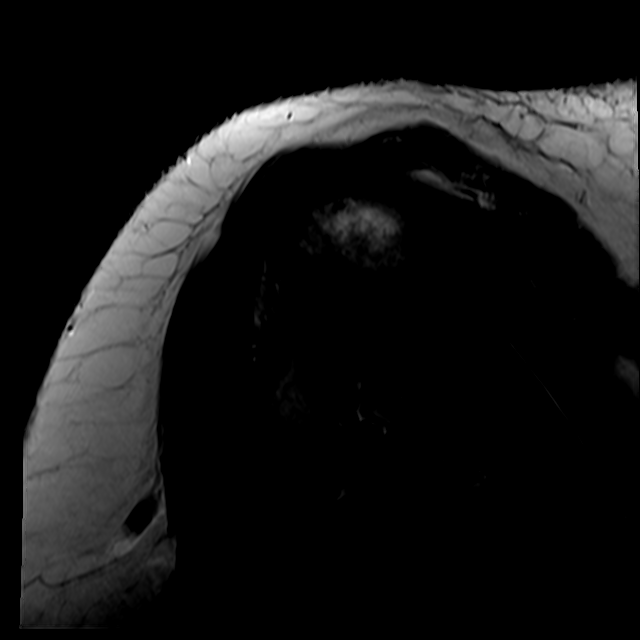
[im 21/21]
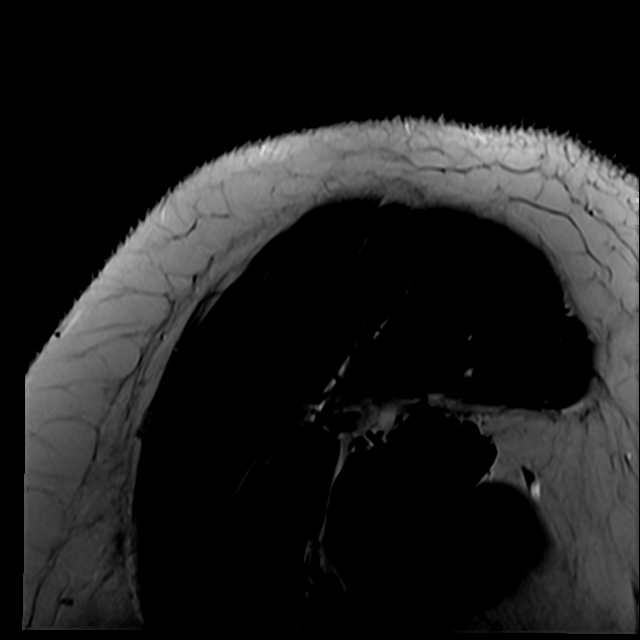

[Series 9: T2 fat-sat · oblique · right · 4.0mm · 0.44mm/px · 3 of 23 slices shown (2 of 3)]
[im 4/23]
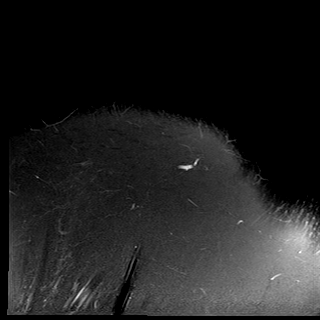
[im 13/23]
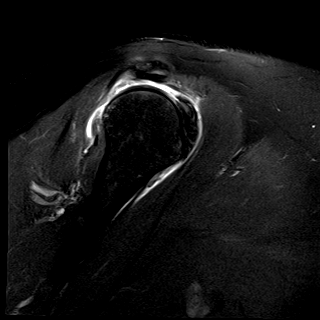
[im 19/23]
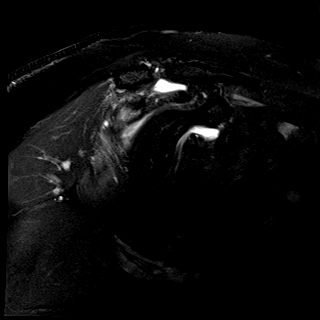

[Series 11: T2 fat-sat · axial · right · 3.0mm · 0.59mm/px · z∈[-16,+58]mm · 3 of 27 slices shown (3 of 3)]
[im 4/27]
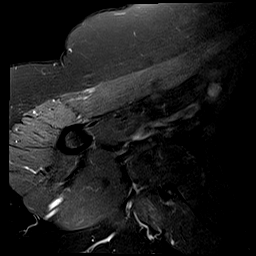
[im 14/27]
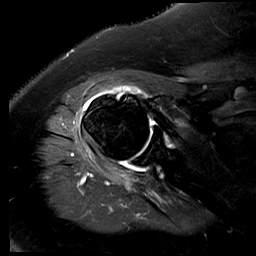
[im 23/27]
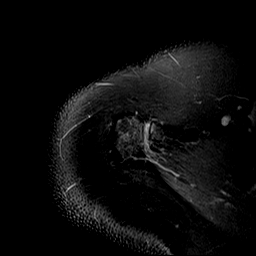

[17 of 40 positions shown; findings below may reference images not displayed]

FINDINGS: Rotator cuff: Full-thickness, full width tears of the supraspinatus
and infraspinatus tendons with retraction up to 3.5 cm. Teres minor
tendon is intact. Mild distal subscapularis tendinosis.

Muscles: No significant muscle atrophy.

Biceps Long Head: Intraarticular and extraarticular portions of the
biceps tendon are intact.

Acromioclavicular Joint: Mild arthropathy of the acromioclavicular
joint. Moderate subacromial/subdeltoid bursal fluid related to the
full-thickness cuff tear.

Glenohumeral Joint: Trace joint effusion.  Mild chondrosis.

Labrum: Degenerative posterosuperior labral tearing.

Bones: No fracture or dislocation. No aggressive osseous lesion.
Glenohumeral and AC joint osteophyte formation.

Other: No additional findings.
IMPRESSION: Full-thickness, full width tears of the supraspinatus and
infraspinatus tendons with retraction up to 3.5 cm. Mild distal
subscapularis tendinosis. No significant muscle atrophy.

Mild glenohumeral and acromioclavicular joint osteoarthritis.

Degenerative posterosuperior labral tearing.

## 2024-01-18 MED ORDER — ONETOUCH VERIO W/DEVICE KIT: PACK | 0 refills | Status: DC

## 2024-01-18 MED ORDER — LANCETS MISC: 11 refills | Status: AC

## 2024-01-18 MED ORDER — ONETOUCH VERIO VI STRP: ORAL_STRIP | 12 refills | Status: AC

## 2024-01-18 NOTE — Telephone Encounter (Signed)
>>   Jan 18, 2024 10:36 AM Bianca Campbell wrote: Patient wants to let Yaakov Guthrie know that she requesting the Glucose test strip and lancets. She has a [facility] monitor but she may have to get the [facility] monitor. Patient is requesting a callback from [place]. She also has question regarding cholesterol monitor and strips.

## 2024-01-18 NOTE — Telephone Encounter (Signed)
Ok [facility] to [facility] 

## 2024-01-18 NOTE — Telephone Encounter (Signed)
 Called and left voice mail

## 2024-04-06 LAB — HM DIABETES EYE EXAM

## 2024-05-28 ENCOUNTER — Telehealth: Payer: Self-pay

## 2024-05-28 NOTE — Telephone Encounter (Signed)
 Copied from CRM [id number]. Topic: Clinical - Request for Lab/Test Order >> May 28, 2024  1:54 PM Mia F wrote: Reason for CRM: [facility] calling to verify pt last appt and next appt and her last A1C test results and kidney functio test. Please advise [phone]

## 2024-05-30 NOTE — Telephone Encounter (Signed)
 Called and spoke with Corean at [facility]. Was able to provide her with the requested information

## 2024-06-04 ENCOUNTER — Other Ambulatory Visit: Payer: Self-pay | Admitting: Internal Medicine

## 2024-06-13 ENCOUNTER — Other Ambulatory Visit: Payer: Self-pay | Admitting: Internal Medicine

## 2024-06-13 MED ORDER — IRBESARTAN 300 MG PO TABS: 300.0000 mg | ORAL_TABLET | Freq: Every day | ORAL | 0 refills | Status: DC

## 2024-06-13 NOTE — Telephone Encounter (Signed)
 Copied from CRM [id number]. Topic: Clinical - Medication Refill >> Jun 13, 2024  9:16 AM Pinkey ORN wrote: Medication: irbesartan  (AVAPRO ) 300 MG tablet  Has the patient contacted their pharmacy? Yes (Agent: If no, request that the patient contact the pharmacy for the refill. If patient does not wish to contact the pharmacy document the reason why and proceed with request.) (Agent: If yes, when and what did the pharmacy advise?)  This is the patient's preferred pharmacy:  [facility] [address] ([address]), [place] - [address] [place] ([place]) [place] [address] Phone: [phone] Fax: [phone]  Is this the correct pharmacy for this prescription? Yes If no, delete pharmacy and type the correct one.   Has the prescription been filled recently? No  Is the patient out of the medication? No  Has the patient been seen for an appointment in the last year OR does the patient have an upcoming appointment? Yes  Can we respond through MyChart? Yes  Agent: Please be advised that Rx refills may take up to 3 business days. We ask that you follow-up with your pharmacy.

## 2024-06-25 ENCOUNTER — Emergency Department (HOSPITAL_COMMUNITY)

## 2024-06-25 ENCOUNTER — Observation Stay (HOSPITAL_COMMUNITY)
Admission: EM | Admit: 2024-06-25 | Discharge: 2024-06-26 | Disposition: A | Discharge status: Home / Self Care | Attending: Internal Medicine | Admitting: Internal Medicine

## 2024-06-25 ENCOUNTER — Other Ambulatory Visit: Payer: Self-pay | Admitting: Cardiology

## 2024-06-25 ENCOUNTER — Encounter (HOSPITAL_COMMUNITY): Payer: Self-pay

## 2024-06-25 ENCOUNTER — Other Ambulatory Visit: Payer: Self-pay

## 2024-06-25 ENCOUNTER — Observation Stay (HOSPITAL_BASED_OUTPATIENT_CLINIC_OR_DEPARTMENT_OTHER)

## 2024-06-25 ENCOUNTER — Ambulatory Visit: Payer: Medicare Other

## 2024-06-25 DIAGNOSIS — R531 Weakness: Principal | ICD-10-CM

## 2024-06-25 DIAGNOSIS — I10 Essential (primary) hypertension: Secondary | ICD-10-CM | POA: Diagnosis present

## 2024-06-25 DIAGNOSIS — Z7982 Long term (current) use of aspirin: Secondary | ICD-10-CM | POA: Insufficient documentation

## 2024-06-25 DIAGNOSIS — I6381 Other cerebral infarction due to occlusion or stenosis of small artery: Secondary | ICD-10-CM

## 2024-06-25 DIAGNOSIS — I7090 Unspecified atherosclerosis: Secondary | ICD-10-CM

## 2024-06-25 DIAGNOSIS — Z91040 Latex allergy status: Secondary | ICD-10-CM | POA: Diagnosis not present

## 2024-06-25 DIAGNOSIS — R202 Paresthesia of skin: Secondary | ICD-10-CM | POA: Diagnosis present

## 2024-06-25 DIAGNOSIS — Z79899 Other long term (current) drug therapy: Secondary | ICD-10-CM | POA: Insufficient documentation

## 2024-06-25 DIAGNOSIS — I639 Cerebral infarction, unspecified: Secondary | ICD-10-CM

## 2024-06-25 DIAGNOSIS — I6389 Other cerebral infarction: Secondary | ICD-10-CM

## 2024-06-25 DIAGNOSIS — R29701 NIHSS score 1: Secondary | ICD-10-CM

## 2024-06-25 DIAGNOSIS — E119 Type 2 diabetes mellitus without complications: Secondary | ICD-10-CM

## 2024-06-25 DIAGNOSIS — R42 Dizziness and giddiness: Secondary | ICD-10-CM

## 2024-06-25 DIAGNOSIS — K219 Gastro-esophageal reflux disease without esophagitis: Secondary | ICD-10-CM | POA: Diagnosis not present

## 2024-06-25 DIAGNOSIS — Z23 Encounter for immunization: Secondary | ICD-10-CM | POA: Diagnosis not present

## 2024-06-25 DIAGNOSIS — E785 Hyperlipidemia, unspecified: Secondary | ICD-10-CM | POA: Diagnosis not present

## 2024-06-25 DIAGNOSIS — R002 Palpitations: Secondary | ICD-10-CM

## 2024-06-25 DIAGNOSIS — I6501 Occlusion and stenosis of right vertebral artery: Secondary | ICD-10-CM | POA: Insufficient documentation

## 2024-06-25 LAB — COMPREHENSIVE METABOLIC PANEL WITH GFR
ALT: 15 U/L (ref 0–44)
AST: 19 U/L (ref 15–41)
Albumin: 4.2 g/dL (ref 3.5–5.0)
Alkaline Phosphatase: 97 U/L (ref 38–126)
Anion gap: 15 (ref 5–15)
BUN: 15 mg/dL (ref 8–23)
CO2: 21 mmol/L — ABNORMAL LOW (ref 22–32)
Calcium: 9.2 mg/dL (ref 8.9–10.3)
Chloride: 104 mmol/L (ref 98–111)
Creatinine, Ser: 0.62 mg/dL (ref 0.44–1.00)
GFR, Estimated: >60 mL/min (ref >60)
Glucose, Bld: 138 mg/dL — ABNORMAL HIGH (ref 70–99)
Potassium: 3.8 mmol/L (ref 3.5–5.1)
Sodium: 140 mmol/L (ref 135–145)
Total Bilirubin: 0.4 mg/dL (ref 0.0–1.2)
Total Protein: 7.9 g/dL (ref 6.5–8.1)

## 2024-06-25 LAB — LIPID PANEL
Cholesterol: 170 mg/dL (ref 0–200)
HDL: 72 mg/dL (ref >40)
LDL Cholesterol: 85 mg/dL (ref 0–99)
Total CHOL/HDL Ratio: 2.4 ratio
Triglycerides: 66 mg/dL (ref <150)
VLDL: 13 mg/dL (ref 0–40)

## 2024-06-25 LAB — DIFFERENTIAL
Abs Immature Granulocytes: 0.01 K/uL (ref 0.00–0.07)
Basophils Absolute: 0 K/uL (ref 0.0–0.1)
Basophils Relative: 1 %
Eosinophils Absolute: 0.3 K/uL (ref 0.0–0.5)
Eosinophils Relative: 4 %
Immature Granulocytes: 0 %
Lymphocytes Relative: 26 %
Lymphs Abs: 1.7 K/uL (ref 0.7–4.0)
Monocytes Absolute: 0.6 K/uL (ref 0.1–1.0)
Monocytes Relative: 9 %
Neutro Abs: 4 K/uL (ref 1.7–7.7)
Neutrophils Relative %: 60 %

## 2024-06-25 LAB — ECHOCARDIOGRAM COMPLETE
Area-P 1/2: 2.56 cm2
Height: 59.5 in
S' Lateral: 3.4 cm
Weight: 3712 [oz_av]

## 2024-06-25 LAB — HEMOGLOBIN A1C
Hgb A1c MFr Bld: 6.4 % — ABNORMAL HIGH (ref 4.8–5.6)
Mean Plasma Glucose: 136.98 mg/dL

## 2024-06-25 LAB — CBC
HCT: 38.4 % (ref 36.0–46.0)
Hemoglobin: 12.2 g/dL (ref 12.0–15.0)
MCH: 26.6 pg (ref 26.0–34.0)
MCHC: 31.8 g/dL (ref 30.0–36.0)
MCV: 83.7 fL (ref 80.0–100.0)
Platelets: 296 K/uL (ref 150–400)
RBC: 4.59 MIL/uL (ref 3.87–5.11)
RDW: 13.4 % (ref 11.5–15.5)
WBC: 6.7 K/uL (ref 4.0–10.5)
nRBC: 0 % (ref 0.0–0.2)

## 2024-06-25 LAB — URINE DRUG SCREEN
Amphetamines: NEGATIVE
Barbiturates: NEGATIVE
Benzodiazepines: NEGATIVE
Cocaine: NEGATIVE
Fentanyl: NEGATIVE
Methadone Scn, Ur: NEGATIVE
Opiates: NEGATIVE
Tetrahydrocannabinol: NEGATIVE

## 2024-06-25 LAB — GLUCOSE, CAPILLARY
Glucose-Capillary: 111 mg/dL — ABNORMAL HIGH (ref 70–99)
Glucose-Capillary: 136 mg/dL — ABNORMAL HIGH (ref 70–99)

## 2024-06-25 LAB — ETHANOL: Alcohol, Ethyl (B): <15 mg/dL (ref <15)

## 2024-06-25 LAB — I-STAT CHEM 8, ED
BUN: 16 mg/dL (ref 8–23)
Calcium, Ion: 1.2 mmol/L (ref 1.15–1.40)
Chloride: 103 mmol/L (ref 98–111)
Creatinine, Ser: 0.6 mg/dL (ref 0.44–1.00)
Glucose, Bld: 141 mg/dL — ABNORMAL HIGH (ref 70–99)
HCT: 40 % (ref 36.0–46.0)
Hemoglobin: 13.6 g/dL (ref 12.0–15.0)
Potassium: 3.7 mmol/L (ref 3.5–5.1)
Sodium: 139 mmol/L (ref 135–145)
TCO2: 22 mmol/L (ref 22–32)

## 2024-06-25 LAB — PROTIME-INR
INR: 1 (ref 0.8–1.2)
Prothrombin Time: 13.6 s (ref 11.4–15.2)

## 2024-06-25 LAB — APTT: aPTT: 30 s (ref 24–36)

## 2024-06-25 MED ORDER — CLOPIDOGREL BISULFATE 75 MG PO TABS
75.0000 mg | ORAL_TABLET | Freq: Every day | ORAL | Status: DC
  Administered 2024-06-26: 75 mg via ORAL
  Filled 2024-06-25: qty 1

## 2024-06-25 MED ORDER — CLOPIDOGREL BISULFATE 75 MG PO TABS
300.0000 mg | ORAL_TABLET | Freq: Once | ORAL | Status: AC
  Administered 2024-06-25: 300 mg via ORAL
  Filled 2024-06-25: qty 4

## 2024-06-25 MED ORDER — STROKE: EARLY STAGES OF RECOVERY BOOK
Freq: Once | Status: AC
  Filled 2024-06-25: qty 1

## 2024-06-25 MED ORDER — ONDANSETRON HCL 4 MG PO TABS: 4.0000 mg | ORAL_TABLET | Freq: Four times a day (QID) | ORAL | Status: DC | PRN

## 2024-06-25 MED ORDER — ACETAMINOPHEN 650 MG RE SUPP: 650.0000 mg | Freq: Four times a day (QID) | RECTAL | Status: DC | PRN

## 2024-06-25 MED ORDER — INSULIN ASPART 100 UNIT/ML IJ SOLN
0.0000 [IU] | Freq: Three times a day (TID) | INTRAMUSCULAR | Status: DC
  Administered 2024-06-26: 3 [IU] via SUBCUTANEOUS

## 2024-06-25 MED ORDER — LORAZEPAM 2 MG/ML IJ SOLN
0.5000 mg | Freq: Once | INTRAMUSCULAR | Status: AC
  Administered 2024-06-25: 0.5 mg via INTRAVENOUS
  Filled 2024-06-25: qty 1

## 2024-06-25 MED ORDER — INFLUENZA VAC SPLIT HIGH-DOSE 0.5 ML IM SUSY
0.5000 mL | PREFILLED_SYRINGE | INTRAMUSCULAR | Status: DC
  Filled 2024-06-25: qty 0.5

## 2024-06-25 MED ORDER — ASPIRIN 81 MG PO TBEC
81.0000 mg | DELAYED_RELEASE_TABLET | Freq: Every day | ORAL | Status: DC
  Administered 2024-06-25 – 2024-06-26 (×2): 81 mg via ORAL
  Filled 2024-06-25 (×2): qty 1

## 2024-06-25 MED ORDER — IOHEXOL 350 MG/ML SOLN
75.0000 mL | Freq: Once | INTRAVENOUS | Status: AC | PRN
  Administered 2024-06-25: 75 mL via INTRAVENOUS

## 2024-06-25 MED ORDER — ACETAMINOPHEN 325 MG PO TABS: 650.0000 mg | ORAL_TABLET | Freq: Four times a day (QID) | ORAL | Status: DC | PRN

## 2024-06-25 MED ORDER — ONDANSETRON HCL 4 MG/2ML IJ SOLN: 4.0000 mg | Freq: Four times a day (QID) | INTRAMUSCULAR | Status: DC | PRN

## 2024-06-25 MED ORDER — ATORVASTATIN CALCIUM 20 MG PO TABS
20.0000 mg | ORAL_TABLET | Freq: Every day | ORAL | Status: DC
  Administered 2024-06-25 – 2024-06-26 (×2): 20 mg via ORAL
  Filled 2024-06-25 (×2): qty 1

## 2024-06-25 NOTE — ED Notes (Signed)
 Patient transported to CT

## 2024-06-25 NOTE — ED Provider Notes (Signed)
  Physical Exam  BP (!) 186/90   Pulse (!) 59   Temp 98.3 F (36.8 C)   Resp 14   SpO2 100%   Physical Exam  Procedures  Procedures  ED Course / MDM    Medical Decision Making Amount and/or Complexity of Data Reviewed Labs: ordered. Radiology: ordered.  Risk Prescription drug management.   68 yo ;female with right side numbness and some weakness.  Denies prior stroke. 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 LKN 2330 Plan admission, will need neuro. CTA results pending. NIH 2-3  Care discussed with neuro who will see her here. MRI pending      Levander Houston, MD 06/25/24 [phone]

## 2024-06-25 NOTE — Progress Notes (Signed)
 PT Cancellation Note  Patient Details Name: Bianca Campbell MRN: [id number] DOB: Mar 07, 1956   Cancelled Treatment:    Reason Eval/Treat Not Completed: Other (comment) Pt admitted for ~10 hours.  Spoke with RN prior to session and she reports family just notified pt of bad news in relation to her brother and pt emotional at this time.  Will f/u later date.   Pearlene Teat, PT [facility] [phone]   [name] [place] 06/25/2024, 4:09 PM

## 2024-06-25 NOTE — ED Triage Notes (Signed)
 PT c/c of numbness on her right side that started at 04:30 this morning. Last know normal was 23:30 06/24/24. Dr. Trine brought to triage for evaluation

## 2024-06-25 NOTE — Progress Notes (Signed)
 Ordering 30 day monitor, per neurology, post stroke. Dr. Lonni to read

## 2024-06-25 NOTE — Consult Note (Signed)
 NEUROLOGY CONSULT NOTE   Date of service: June 25, 2024 Patient Name: Bianca Campbell MRN:  [id number] DOB:  August 14, 1956 Chief Complaint: Right-sided numbness and vertigo Requesting Provider: Celinda Alm Lot, MD  History of Present Illness  Bianca Campbell is a 68 y.o. female with hx of hypertension, hyperlipidemia, diabetes, intermittent vertigo, gout and arthritis who presents with sudden onset right sided numbness.  She reports that she first noticed her symptoms when she got up at 4:30 in the morning today to go to the bathroom.  She has had a recent cold which she has treated with over-the-counter medications and reports that she had some diarrhea over the weekend.  She has also had some nausea.  She does report some vertigo, feeling like the room is spinning around her but states that this is an intermittent problem.  She reports infrequent palpitations as well.  LKW: 06/24/22/1930 Modified rankin score: 0-Completely asymptomatic and back to baseline post- stroke IV Thrombolysis: No, outside of window EVT: No, no LVO  NIHSS components Score: Comment  1a Level of Conscious 0[x]  1[]  2[]  3[]      1b LOC Questions 0[x]  1[]  2[]       1c LOC Commands 0[x]  1[]  2[]       2 Best Gaze 0[x]  1[]  2[]       3 Visual 0[x]  1[]  2[]  3[]      4 Facial Palsy 0[x]  1[]  2[]  3[]      5a Motor Arm - left 0[x]  1[]  2[]  3[]  4[]  UN[]    5b Motor Arm - Right 0[x]  1[]  2[]  3[]  4[]  UN[]    6a Motor Leg - Left 0[x]  1[]  2[]  3[]  4[]  UN[]    6b Motor Leg - Right 0[x]  1[]  2[]  3[]  4[]  UN[]    7 Limb Ataxia 0[x]  1[]  2[]  UN[]      8 Sensory 0[]  1[x]  2[]  UN[]      9 Best Language 0[x]  1[]  2[]  3[]      10 Dysarthria 0[x]  1[]  2[]  UN[]      11 Extinct. and Inattention 0[x]  1[]  2[]       TOTAL:1       ROS  Comprehensive ROS performed and pertinent positives documented in HPI   Past History   Past Medical History:  Diagnosis Date   ALLERGIC RHINITIS 09/11/2007   Anemia, unspecified 08/31/2012   Arthritis    knees  (11/23/2016)   BACK PAIN    EXTERNAL OTITIS 09/11/2007   GERD 06/06/2007   GOUT 11/14/2007   High cholesterol    HYPERTENSION 06/06/2007   INTERMITTENT VERTIGO 09/11/2007   Irritable bowel syndrome 06/06/2007   LOW BACK PAIN 06/06/2007   OSTEOARTHRITIS 06/06/2007   PONV (postoperative nausea and vomiting)    Primary localized osteoarthritis of right knee    SHOULDER PAIN, RIGHT 12/30/2008    Past Surgical History:  Procedure Laterality Date   27 HOUR PH STUDY N/A 05/27/2019   Procedure: 24 HOUR PH STUDY;  Surgeon: Albertus Gordy HERO, MD;  Location: [facility];  Service: Gastroenterology;  Laterality: N/A;   BILATERAL SALPINGOOPHORECTOMY     CESAREAN SECTION  1987, 1991   COLONOSCOPY     pt stated 2015 or 2016 unknown md   ESOPHAGEAL MANOMETRY N/A 05/27/2019   Procedure: ESOPHAGEAL MANOMETRY (EM) WITH IMPEDENCE STUDY;  Surgeon: Albertus Gordy HERO, MD;  Location: [facility];  Service: Gastroenterology;  Laterality: N/A;   EXPLORATORY LAPAROTOMY     bowel obstruction   INGUINAL HERNIA REPAIR Bilateral    numerous hernia repairs 1996 to 2011   JOINT  REPLACEMENT     KNEE ARTHROSCOPY Bilateral 2013 - 11/11/2015   LAPAROSCOPIC CHOLECYSTECTOMY  1991   s/p incarcerated hernia and ovary cyst Jan 2011  2011   TOTAL KNEE ARTHROPLASTY Right 11/21/2016   Procedure: RIGHT TOTAL KNEE ARTHROPLASTY;  Surgeon: Lamar Millman, MD;  Location: [facility] OR;  Service: Orthopedics;  Laterality: Right;   UMBILICAL HERNIA REPAIR     numerous hernia repairs 1996 to 2011    Family History: Family History  Problem Relation Age of Onset   Arthritis Mother        RA   Diabetes Mother    Seizures Mother    Colon polyps Father 22   Healthy Sister    Healthy Brother    Healthy Son    Healthy Daughter    Asthma Daughter    Colon cancer Neg Hx    Esophageal cancer Neg Hx    Rectal cancer Neg Hx    Stomach cancer Neg Hx     Social History  reports that she has never smoked. She has never used smokeless tobacco. She  reports that she does not drink alcohol and does not use drugs.  Allergies  Allergen Reactions   Latex Itching    Medications   Current Facility-Administered Medications:    [START ON 06/26/2024]  stroke: early stages of recovery book, , Does not apply, Once, Celinda Alm Lot, MD   acetaminophen  (TYLENOL ) tablet 650 mg, 650 mg, Oral, Q6H PRN **OR** acetaminophen  (TYLENOL ) suppository 650 mg, 650 mg, Rectal, Q6H PRN, Celinda Alm Lot, MD   ondansetron  (ZOFRAN ) tablet 4 mg, 4 mg, Oral, Q6H PRN **OR** ondansetron  (ZOFRAN ) injection 4 mg, 4 mg, Intravenous, Q6H PRN, Celinda Alm Lot, MD  Current Outpatient Medications:    acetaminophen  (TYLENOL ) 500 MG tablet, Take 1,000 mg by mouth daily as needed for mild pain (pain score 1-3), headache or moderate pain (pain score 4-6)., Disp: , Rfl:    amLODipine  (NORVASC ) 10 MG tablet, Take 1 tablet by mouth once daily, Disp: 90 tablet, Rfl: 3   Ascorbic Acid (VITAMIN C PO), Take 1 tablet by mouth daily., Disp: , Rfl:    aspirin  EC 81 MG tablet, Take 81 mg by mouth daily. Swallow whole., Disp: , Rfl:    Barberry-Oreg Grape-Goldenseal (BERBERINE COMPLEX PO), Take 2 capsules by mouth daily., Disp: , Rfl:    Cholecalciferol  (VITAMIN D3) 5000 units CAPS, Take 5,000 Units by mouth daily., Disp: , Rfl:    CHROMIUM PO, Take 1 capsule by mouth daily., Disp: , Rfl:    irbesartan  (AVAPRO ) 300 MG tablet, Take 1 tablet (300 mg total) by mouth daily., Disp: 90 tablet, Rfl: 0   meclizine  (ANTIVERT ) 25 MG tablet, Take 1 tablet (25 mg total) by mouth 3 (three) times daily as needed for dizziness., Disp: 30 tablet, Rfl: 5   meloxicam  (MOBIC ) 15 MG tablet, TAKE 1 TABLET BY MOUTH ONCE DAILY AS NEEDED FOR PAIN, Disp: 90 tablet, Rfl: 3   OVER THE COUNTER MEDICATION, Take 2 capsules by mouth daily. Glucose advance, Disp: , Rfl:    glucose blood (ONETOUCH VERIO) test strip, Use as instructed four time per day E11.9, Disp: 400 each, Rfl: 12   Lancets MISC, Use as  directed four times per day E11.9, Disp: 400 each, Rfl: 11   tiZANidine  (ZANAFLEX ) 4 MG tablet, Take 4 mg by mouth every 6 (six) hours as needed. (Patient not taking: Reported on 06/25/2024), Disp: , Rfl:   Vitals   Vitals:   06/25/24 9347  12-Jul-2024 0834 07-12-2024 1000 07-12-2024 1100  BP:   110/63 (!) 147/77  Pulse: (!) 59  60 66  Resp: 14  15 (!) 25  Temp:    97.8 F (36.6 C)  TempSrc:    Oral  SpO2: 100% 100% 96% 99%    There is no height or weight on file to calculate BMI.   Physical Exam   Constitutional: Appears well-developed and well-nourished.  Psych: Affect appropriate to situation.  Eyes: No scleral injection.  HENT: No OP obstruction.  Head: Normocephalic.  Cardiovascular: Normal rate and regular rhythm.  Respiratory: Effort normal, non-labored breathing.  Skin: WDI.   Neurologic Examination    NEURO:  Mental Status: AA&Ox3, able to give clear and coherent history of present illness Speech/Language: speech is without dysarthria or aphasia.  Fluency, and comprehension intact.  Cranial Nerves:  II: PERRL. Visual fields full.  III, IV, VI: EOMI. Eyelids elevate symmetrically.  V: Sensation is intact to light touch and diminished on the right VII: Smile is symmetrical.  VIII: hearing intact to voice. IX, X: Phonation is normal.  KP:Dynloizm shrug 5/5. XII: tongue is midline without fasciculations. Motor: 5/5 strength in bilateral arms, but right hand grip weaker than left.  Able to move bilateral lower extremities with antigravity strength, 5/5 on the left and 4+/5 on the right Tone: is normal and bulk is normal Sensation- Intact to light touch bilaterally. Extinction absent to light touch to DSS.   Coordination: FTN intact bilaterally, HKS: no ataxia in BLE. Gait- deferred    Labs/Imaging/Neurodiagnostic studies   CBC:  Recent Labs  Lab 2024-07-12 0643 07-12-24 0654  WBC 6.7  --   NEUTROABS 4.0  --   HGB 12.2 13.6  HCT 38.4 40.0  MCV 83.7  --   PLT  296  --    Basic Metabolic Panel:  Lab Results  Component Value Date   NA 139 Jul 12, 2024   K 3.7 2024-07-12   CO2 21 (L) 07-12-2024   GLUCOSE 141 (H) Jul 12, 2024   BUN 16 Jul 12, 2024   CREATININE 0.60 Jul 12, 2024   CALCIUM  9.2 07/12/2024   GFRNONAA >60 12-Jul-2024   GFRAA 108 06/24/2020   Lipid Panel:  Lab Results  Component Value Date   LDLCALC 108 (H) 07/17/2023   HgbA1c:  Lab Results  Component Value Date   HGBA1C 7.2 (H) 07/17/2023   Urine Drug Screen:     Component Value Date/Time   LABOPIA NEGATIVE 2024/07/12 0629   COCAINSCRNUR NEGATIVE Jul 12, 2024 0629   LABBENZ NEGATIVE July 12, 2024 0629   AMPHETMU NEGATIVE 12-Jul-2024 0629   THCU NEGATIVE 07-12-2024 0629   LABBARB NEGATIVE 07/12/24 0629    Alcohol Level     Component Value Date/Time   ETH <15 July 12, 2024 0643   INR  Lab Results  Component Value Date   INR 1.0 07-12-2024   APTT  Lab Results  Component Value Date   APTT 30 12-Jul-2024    CT Head without contrast(Personally reviewed): No acute abnormalities  CT angio Head and Neck with contrast(Personally reviewed): No LVO, severe stenosis of right vertebral artery origin, mild to moderate stenosis of supraclinoid right ICA siphon, right PCA P2 segment of left PCA P3 segment with mild left MCA M1 stenosis  MRI Brain(Personally reviewed): Acute lacunar infarct in the left thalamus   ASSESSMENT   SHAIVI ROTHSCHILD is a 68 y.o. female with hx of hypertension, hyperlipidemia, diabetes, intermittent vertigo, gout and arthritis who presents with acute onset right-sided numbness and vertigo.  Right-sided numbness  was first noted when patient got out of bed to go to the bathroom at 430 this morning.  MRI demonstrates left thalamic infarct.  She is undergoing stroke workup.  No LVO seen on CTA, but some multifocal intracranial stenosis is seen.  Will load patient with Plavix  and have patient take aspirin  81 mg daily and Plavix  75 mg daily for 3 months followed by  Plavix  alone.  She will need PT/OT/SLP as well as echocardiogram, A1c and lipid panel.  Patient states she is having some vertigo, which is an intermittent problem.  Hints exam is negative, so suspect that this is vertigo of peripheral origin.  Will have PT perform vestibular evaluation.  RECOMMENDATIONS  Stroke/TIA Workup  - Permissive HTN x48 hrs from sx onset goal BP <220/110. PRN labetalol or hydralazine  if BP above these parameters. Avoid oral antihypertensives. - TTE  - Check A1c and LDL + add statin per guidelines - Load with Plavix  300 mg daily, then aspirin  81 mg daily and Plavix  75 mg daily for 3 months followed by Plavix  alone antiplt/anticoag - q4 hr neuro checks - STAT head CT for any change in neuro exam - Tele - PT/OT/SLP, including vestibular evaluation - Stroke education - Amb referral to neurology upon discharge   -May discharge patient once stroke workup is complete. ______________________________________________________________________  Patient seen by NP and then by MD, MD to edit note as needed.  Signed, Cortney E Everitt Clint Kill, NP [facility]   NEUROHOSPITALIST ADDENDUM Performed a face to face diagnostic evaluation.   I have reviewed the contents of history and physical exam as documented by PA/ARNP/Resident and agree with above documentation.  I have discussed and formulated the above plan as documented. Edits to the note have been made as needed.  Impression/Key exam findings/Plan: 65F with hx of HTN, HLD, DM2, obesity who presents with R sided numbness and slight weakness. 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 Etiology is likely small vessel stroke. Risk factors include DM2, Htn, HLD, lack of exercise.  I discussed with patient in detail the mechanism of small vessel stroke, risk factors and encouraged good control of DM2, Htn, HLD and exercising to reduce risk of stroke in the future. She has been hypertensive to 150s here. HbA1c is pending but last  HbA1c from a year ago was 7.2. Goal HbA1c is below 7. Last LDL from a year ago was 108. Goal for her is below 70. 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  She is to see her PCP in a few days.  LDL is pending. TTE with EF of 60-65%, no mention of PFO. If LDL is greater than 70, please start Atorvastatin .  Follow up with stroke clinic outpatient.   Jessee Mezera, MD [facility] [phone]   If 7pm to 7am, please call on call as listed on AMION.

## 2024-06-25 NOTE — H&P (Signed)
 History and Physical    Patient: Bianca Campbell DOB: [birth year] DOA: 06/25/2024 DOS: the patient was seen and examined on 06/25/2024 PCP: Norleen Lynwood ORN, MD  Patient coming from: Home  Chief Complaint:  Chief Complaint  Patient presents with   Numbness   HPI: Bianca Campbell is a 68 y.o. female with medical history significant of allergic rhinitis, MRSA carrier, osteoarthritis, chronic back pain, GERD, gout, hyperlipidemia, hypertension, irritable bowel syndrome, class III obesity, type 2 diabetes, vertigo syndrome origin, vitamin D  deficiency who presented to the emergency department complaints of right-sided numbness that started at 0430 today.  LKN was 2330 yesterday.  Bianca Campbell has had some mild gait imbalance, blurry vision and earlier had a headache, that has resolved since.  Bianca Campbell denied fever, chills, rhinorrhea, sore throat, wheezing or hemoptysis.  No chest pain, palpitations, diaphoresis, PND, orthopnea or pitting edema of the lower extremities.  No abdominal pain, nausea, emesis, diarrhea, constipation, melena or hematochezia.  No flank pain, dysuria, frequency or hematuria.  No polyuria, polydipsia or polyphagia.  Lab work: UDS was negative.  CBC, PT, INR and PTT were normal.  Alcohol level was unremarkable.  CMP showed a CO2 of 21 mmol/L with a normal anion gap and a glucose of 138 mg/dL, the rest of the CMP measurements were normal.  Imaging: CTA head and neck negative for large vessel occlusion.  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  Intracranial atherosclerosis with mild to moderate stenosis of the supraclinoid right ICA siphon, right PCA P2 segment, and left PCA P3 segment.  There was mild left MCA M1 involvement.  There was no intracranial normality by CT.  Advanced C-spine degeneration.  MRI brain without contrast showed positive acute lacunar infarct in the left thalamus.  No associated hemorrhage or mass effect.  No other acute  intracranial normality.  Mild to moderate for age cerebral white matter disease not significantly changed since 2019.  ED course: Initial vital signs were temperature 98.3 F, pulse 97, respirations 20, BP 155/100 mmHg and O2 sat 95% on room air.  The patient received lorazepam  0.5 mg IVP prior to MRI scan.   Review of Systems: As mentioned in the history of present illness. All other systems reviewed and are negative. Past Medical History:  Diagnosis Date   ALLERGIC RHINITIS 09/11/2007   Anemia, unspecified 08/31/2012   Arthritis    knees (11/23/2016)   BACK PAIN    EXTERNAL OTITIS 09/11/2007   GERD 06/06/2007   GOUT 11/14/2007   High cholesterol    HYPERTENSION 06/06/2007   INTERMITTENT VERTIGO 09/11/2007   Irritable bowel syndrome 06/06/2007   LOW BACK PAIN 06/06/2007   OSTEOARTHRITIS 06/06/2007   PONV (postoperative nausea and vomiting)    Primary localized osteoarthritis of right knee    SHOULDER PAIN, RIGHT 12/30/2008   Past Surgical History:  Procedure Laterality Date   70 HOUR PH STUDY N/A 05/27/2019   Procedure: 24 HOUR PH STUDY;  Surgeon: Albertus Gordy HERO, MD;  Location: [facility];  Service: Gastroenterology;  Laterality: N/A;   BILATERAL SALPINGOOPHORECTOMY     CESAREAN SECTION  1987, 1991   COLONOSCOPY     pt stated 2015 or 2016 unknown md   ESOPHAGEAL MANOMETRY N/A 05/27/2019   Procedure: ESOPHAGEAL MANOMETRY (EM) WITH IMPEDENCE STUDY;  Surgeon: Albertus Gordy HERO, MD;  Location: [facility];  Service: Gastroenterology;  Laterality: N/A;   EXPLORATORY LAPAROTOMY     bowel obstruction   INGUINAL HERNIA REPAIR Bilateral  numerous hernia repairs 1996 to 2011   JOINT REPLACEMENT     KNEE ARTHROSCOPY Bilateral 2013 - 11/11/2015   LAPAROSCOPIC CHOLECYSTECTOMY  1991   s/p incarcerated hernia and ovary cyst Jan 2011  2011   TOTAL KNEE ARTHROPLASTY Right 11/21/2016   Procedure: RIGHT TOTAL KNEE ARTHROPLASTY;  Surgeon: Lamar Millman, MD;  Location: [facility];  Service: Orthopedics;   Laterality: Right;   UMBILICAL HERNIA REPAIR     numerous hernia repairs 1996 to 2011   Social History:  reports that Bianca Campbell has never smoked. Bianca Campbell has never used smokeless tobacco. Bianca Campbell reports that Bianca Campbell does not drink alcohol and does not use drugs.  Allergies  Allergen Reactions   Latex Itching    Family History  Problem Relation Age of Onset   Arthritis Mother        RA   Diabetes Mother    Seizures Mother    Colon polyps Father 15   Healthy Sister    Healthy Brother    Healthy Son    Healthy Daughter    Asthma Daughter    Colon cancer Neg Hx    Esophageal cancer Neg Hx    Rectal cancer Neg Hx    Stomach cancer Neg Hx     Prior to Admission medications   Medication Sig Start Date End Date Taking? Authorizing Provider  acetaminophen  (TYLENOL ) 500 MG tablet Take 1,000 mg by mouth every 6 (six) hours as needed for mild pain or headache.    [provider]  amLODipine  (NORVASC ) 10 MG tablet Take 1 tablet by mouth once daily 08/14/23   Norleen Lynwood ORN, MD  Ascorbic Acid (VITAMIN C PO) Take 1 tablet by mouth daily.    [provider]  aspirin  EC 81 MG tablet Take 81 mg by mouth daily. Swallow whole. Patient not taking: Reported on 07/17/2023    [provider]  Blood Glucose Monitoring Suppl [facility] VERIO) w/Device KIT Use as directed four times per day E11.9 01/18/24   Norleen Lynwood ORN, MD  Cholecalciferol  (VITAMIN D3) 5000 units CAPS Take 5,000 Units by mouth daily.    [provider]  Cyanocobalamin  (VITAMIN B 12 PO) Take 1 tablet by mouth daily.    [provider]  cyclobenzaprine  (FLEXERIL ) 5 MG tablet Take 1 tablet (5 mg total) by mouth 3 (three) times daily as needed. 07/17/23   Norleen Lynwood ORN, MD  diclofenac  (VOLTAREN ) 75 MG EC tablet Take 1 tablet (75 mg total) by mouth 2 (two) times daily. Patient not taking: Reported on 07/17/2023 03/09/23   Magdalen Pasco RAMAN, DPM  fluticasone  (FLONASE ) 50 MCG/ACT nasal spray Place 1 spray into both  nostrils daily. 04/05/23   Hazen Darryle BRAVO, FNP  glucose blood [facility] VERIO) test strip Use as instructed four time per day E11.9 01/18/24   Norleen Lynwood ORN, MD  ibuprofen  (ADVIL ) 800 MG tablet Take 1 tablet (800 mg total) by mouth 3 (three) times daily. Patient not taking: Reported on 07/17/2023 11/20/21   Conklin, Erica R, PA-C  irbesartan  (AVAPRO ) 300 MG tablet Take 1 tablet (300 mg total) by mouth daily. 06/13/24   Geofm Glade PARAS, MD  Lancets MISC Use as directed four times per day E11.9 01/18/24   Norleen Lynwood ORN, MD  meclizine  (ANTIVERT ) 25 MG tablet Take 1 tablet (25 mg total) by mouth 3 (three) times daily as needed for dizziness. 11/02/22   Norleen Lynwood ORN, MD  meloxicam  (MOBIC ) 15 MG tablet TAKE 1 TABLET BY MOUTH ONCE  DAILY AS NEEDED FOR PAIN 09/13/22   Norleen Lynwood ORN, MD  metFORMIN  (GLUCOPHAGE -XR) 500 MG 24 hr tablet Take 1 tablet (500 mg total) by mouth daily with breakfast. 07/17/23   Norleen Lynwood ORN, MD  Multiple Vitamins-Minerals (ZINC PO) Take 1 tablet by mouth daily. Patient not taking: Reported on 07/17/2023    [provider]  ondansetron  (ZOFRAN -ODT) 4 MG disintegrating tablet Take 1 tablet (4 mg total) by mouth every 8 (eight) hours as needed for nausea or vomiting. 04/05/23   Hazen Darryle BRAVO, FNP  rosuvastatin  (CRESTOR ) 10 MG tablet Take 1 tablet (10 mg total) by mouth daily. 07/17/23   Norleen Lynwood ORN, MD    Physical Exam: Vitals:   06/25/24 0523 06/25/24 0531 06/25/24 0645 06/25/24 0652  BP: (!) 155/100  (!) 186/90   Pulse: 97  (!) 59 (!) 59  Resp: 20  14 14   Temp: 98.3 F (36.8 C)     SpO2: (!) 60% 95% 99% 100%   Physical Exam Vitals and nursing note reviewed.  Constitutional:      General: Bianca Campbell is awake. Bianca Campbell is not in acute distress.    Appearance: Bianca Campbell is ill-appearing.  HENT:     Head: Normocephalic.     Nose: No rhinorrhea.     Mouth/Throat:     Mouth: Mucous membranes are moist.  Eyes:     General: No scleral icterus.    Pupils: Pupils are equal, round, and reactive  to light.  Neck:     Vascular: No JVD.  Cardiovascular:     Rate and Rhythm: Normal rate and regular rhythm.     Heart sounds: S1 normal and S2 normal.  Pulmonary:     Breath sounds: No wheezing, rhonchi or rales.  Abdominal:     General: Bowel sounds are normal. There is no distension.     Palpations: Abdomen is soft.     Tenderness: There is no abdominal tenderness. There is no right CVA tenderness or left CVA tenderness.  Musculoskeletal:     Cervical back: Neck supple.     Right lower leg: No edema.     Left lower leg: No edema.  Skin:    General: Skin is warm and dry.  Neurological:     Mental Status: Bianca Campbell is alert and oriented to person, place, and time.  Psychiatric:        Mood and Affect: Mood normal.        Behavior: Behavior normal. Behavior is cooperative.     Data Reviewed:  Results are pending, will review when available. 06/25/2024 echocardiogram report. IMPRESSIONS:   1. Left ventricular ejection fraction, by estimation, is 60 to 65%. The  left ventricle has normal function. The left ventricle has no regional  wall motion abnormalities. Left ventricular diastolic parameters were  normal.   2. Right ventricular systolic function is normal. The right ventricular  size is normal. There is normal pulmonary artery systolic pressure. The  estimated right ventricular systolic pressure is 19.8 mmHg.   3. Left atrial size was mild to moderately dilated.   4. The mitral valve is normal in structure. No evidence of mitral valve  regurgitation. No evidence of mitral stenosis.   5. The aortic valve is tricuspid. Aortic valve regurgitation is not  visualized. No aortic stenosis is present.   6. The inferior vena cava is normal in size with greater than 50%  respiratory variability, suggesting right atrial pressure of 3 mmHg.   EKG: Vent. rate 62  BPM PR interval 162 ms QRS duration 97 ms QT/QTcB 414/421 ms P-R-T axes 54 -7 -18 Sinus rhythm Nonspecific T  abnormalities, inferior leads  Assessment and Plan: Principal Problem:   Acute right-sided weakness In the setting of:   Acute CVA (cerebrovascular accident) (HCC)  Observation/PCU. Frequent neurochecks. Consult PT and OT. Check fasting lipids. Check hemoglobin A1c. Risk factors modifications. Stroke team input appreciated. Started on clopidogrel  by neurology. Hemoglobin A1c and lipids pending. -Will need statin if LDL greater than 70 mg/dL.  Active Problems:   Hyperlipidemia Begin statin as above.    Essential hypertension Allow permissive hypertension for now.    GERD without esophagitis Antiacid, H2 blocker or PPI as needed.    Advance Care Planning:   Code Status: Full Code   Consults: Neurohospitalist team.  Family Communication:   Severity of Illness: The appropriate patient status for this patient is OBSERVATION. Observation status is judged to be reasonable and necessary in order to provide the required intensity of service to ensure the patient's safety. The patient's presenting symptoms, physical exam findings, and initial radiographic and laboratory data in the context of their medical condition is felt to place them at decreased risk for further clinical deterioration. Furthermore, it is anticipated that the patient will be medically stable for discharge from the hospital within 2 midnights of admission.   Author: Alm Dorn Castor, MD 06/25/2024 8:01 AM  For on call review [email or web address].   This document was prepared using Dragon voice recognition software and may contain some unintended transcription errors.

## 2024-06-25 NOTE — Progress Notes (Signed)
  Echocardiogram 2D Echocardiogram has been performed.  Tinnie FORBES Gosling RDCS 06/25/2024, 1:53 PM

## 2024-06-25 NOTE — Progress Notes (Signed)
   06/25/24 1543  TOC Brief Assessment  Insurance and Status Reviewed  Patient has primary care physician Yes Vilinda, Lynwood ORN, MD)  Home environment has been reviewed Home  Prior level of function: Independent  Prior/Current Home Services No current home services  Social Drivers of Health Review SDOH reviewed no interventions necessary  Readmission risk has been reviewed Yes  Transition of care needs no transition of care needs at this time

## 2024-06-25 NOTE — ED Notes (Signed)
 Patient denies any current needs prior to being transferred upstairs.

## 2024-06-25 NOTE — ED Provider Notes (Signed)
 [place] EMERGENCY DEPARTMENT AT [facility] Provider Note  CSN: [id number] Arrival date & time: 06/25/24 9482  Chief Complaint(s) Numbness  HPI Bianca Campbell is a 68 y.o. female with a past medical history listed below including hypertension, hyperlipidemia and recently diagnosed diabetes here for right-sided upper and lower extremity numbness noted around 4:30a AM this morning when she awoke to use the restroom.  Patient last known normal around 23:30p when she went to bed.  Patient does note some weakness in her right upper extremity.  No visual disturbance.  No headache.  No nausea or vomiting.  No chest pain.  No shortness of breath.  She denies any prior history of CVA. On review of records, patient had an MRI in October 2019 showing a remote left pontine stroke.  HPI  Past Medical History Past Medical History:  Diagnosis Date   ALLERGIC RHINITIS 09/11/2007   Anemia, unspecified 08/31/2012   Arthritis    knees (11/23/2016)   BACK PAIN    EXTERNAL OTITIS 09/11/2007   GERD 06/06/2007   GOUT 11/14/2007   High cholesterol    HYPERTENSION 06/06/2007   INTERMITTENT VERTIGO 09/11/2007   Irritable bowel syndrome 06/06/2007   LOW BACK PAIN 06/06/2007   OSTEOARTHRITIS 06/06/2007   PONV (postoperative nausea and vomiting)    Primary localized osteoarthritis of right knee    SHOULDER PAIN, RIGHT 12/30/2008   Patient Active Problem List   Diagnosis Date Noted   Elevated coronary artery calcium  score 08/29/2023   Cough 11/02/2022   Diabetes (HCC) 07/16/2022   Encounter for well adult exam with abnormal findings 07/14/2022   Chronic cough    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 Authorizing Provider  acetaminophen  (TYLENOL ) 500 MG tablet Take 1,000 mg by mouth every 6 (six) hours as needed for mild pain or headache.    [provider]  amLODipine  (NORVASC ) 10 MG tablet Take 1 tablet by mouth once daily 08/14/23   Norleen Lynwood ORN, MD  Ascorbic Acid (VITAMIN C PO) Take 1 tablet by mouth daily.    [provider]  aspirin  EC 81 MG tablet Take 81 mg by mouth daily. Swallow whole. Patient not taking: Reported on 07/17/2023    [provider]  Blood Glucose Monitoring Suppl [facility] VERIO) w/Device KIT Use as directed four times per day E11.9 01/18/24   Norleen Lynwood ORN, MD  Cholecalciferol  (VITAMIN D3) 5000 units CAPS Take 5,000 Units by mouth daily.    [provider]  Cyanocobalamin  (VITAMIN B 12 PO) Take 1 tablet by mouth daily.    [provider]  cyclobenzaprine  (FLEXERIL ) 5 MG tablet Take 1 tablet (5 mg total) by mouth 3 (three) times daily as needed. 07/17/23   Norleen Lynwood ORN, MD  diclofenac  (VOLTAREN ) 75 MG EC tablet Take 1 tablet (75 mg total) by mouth 2 (two) times daily. Patient not taking: Reported on 07/17/2023 03/09/23   Magdalen Pasco RAMAN, DPM  fluticasone  (FLONASE ) 50 MCG/ACT nasal spray Place 1 spray into both nostrils daily. 04/05/23   Hazen Darryle BRAVO, FNP  glucose blood [facility] VERIO) test strip Use as instructed four time per day E11.9 01/18/24   Norleen Lynwood ORN, MD  ibuprofen  (ADVIL ) 800 MG tablet Take 1 tablet (800 mg total) by mouth 3 (three) times daily. Patient not taking: Reported on 07/17/2023 11/20/21   Conklin, Erica R, PA-C  irbesartan  (AVAPRO ) 300 MG tablet Take 1 tablet (300 mg total) by mouth daily. 06/13/24   Geofm Glade PARAS, MD   Lancets MISC Use as directed four times per day E11.9 01/18/24   Norleen Lynwood ORN, MD  meclizine  (ANTIVERT ) 25 MG tablet Take 1 tablet (25 mg total) by mouth 3 (three) times daily as needed for dizziness. 11/02/22   Norleen Lynwood ORN, MD  meloxicam  (MOBIC ) 15 MG tablet TAKE 1 TABLET BY MOUTH ONCE DAILY AS NEEDED FOR PAIN 09/13/22   Norleen Lynwood ORN, MD  metFORMIN  (GLUCOPHAGE -XR) 500 MG 24 hr tablet Take 1 tablet (500 mg total) by mouth daily with breakfast. 07/17/23   Norleen Lynwood ORN, MD  Multiple Vitamins-Minerals (ZINC PO) Take 1 tablet by mouth daily. Patient not taking: Reported on 07/17/2023    [provider]  ondansetron  (ZOFRAN -ODT) 4 MG disintegrating tablet Take 1 tablet (4 mg total) by mouth every 8 (eight) hours as needed for nausea or vomiting. 04/05/23   Hazen Darryle BRAVO, FNP  rosuvastatin  (CRESTOR ) 10 MG tablet Take 1 tablet (10 mg total) by mouth daily. 07/17/23   Norleen Lynwood ORN, MD                                                                                                                                    Allergies Latex  Review of Systems Review of Systems As noted in HPI  Physical Exam Vital Signs  I have reviewed the triage vital signs BP (!) 186/90   Pulse 97   Temp 98.3 F (36.8 C)   Resp 14   SpO2 95%   Physical Exam Vitals reviewed.  Constitutional:      General: She is not in acute distress.    Appearance: She is well-developed. She is not diaphoretic.  HENT:     Head: Normocephalic and atraumatic.     Nose: Nose normal.  Eyes:     General: No scleral icterus.       Right eye: No discharge.        Left eye: No discharge.     Conjunctiva/sclera: Conjunctivae normal.     Pupils: Pupils are equal, round, and reactive to light.  Cardiovascular:     Rate and Rhythm: Normal rate and regular rhythm.     Heart sounds: No murmur heard.    No friction rub. No gallop.  Pulmonary:     Effort: Pulmonary effort is normal. No respiratory distress.     Breath sounds:  Normal breath sounds. No stridor. No rales.  Abdominal:     General: There is no distension.     Palpations: Abdomen is soft.     Tenderness: There is no abdominal tenderness.  Musculoskeletal:        General: No tenderness.     Cervical back: Normal range of motion and neck supple.  Skin:    General: Skin is warm and dry.     Findings: No erythema or rash.  Neurological:     Mental Status: She is alert and oriented to person, place, and time.     Comments: Mental Status:  Alert and oriented to person, place, and time.  Attention and concentration normal.  Speech clear.  Recent memory is intact  Cranial Nerves:  II Visual Fields: Intact to confrontation. Visual fields intact. III, IV, VI: Pupils equal and reactive to light and near. Full eye movement without nystagmus  V Facial Sensation: Normal. No weakness of masticatory muscles  VII: No facial weakness or asymmetry  VIII Auditory Acuity: Grossly normal  IX/X: The uvula is midline; the palate elevates symmetrically  XI: Normal sternocleidomastoid and trapezius strength  XII: The tongue is midline. No atrophy or fasciculations.   Motor System: Muscle Strength: 3/5 grip strength on the right, 4/5 strength with flexion and extension at the elbow on right.  4 out of 5 strength with flexion extension at the knee on the right.  5 out of 5 strength on left.  No pronation or drift.  Muscle Tone: Tone and muscle bulk are normal in the upper and lower extremities.  Coordination: Intact finger-to-nose.  No tremor.  Sensation: Decrease sensation in right upper and lower extremities Gait: deferred      ED Results and Treatments Labs (all labs ordered are listed, but only abnormal results are displayed) Labs Reviewed  CBC  DIFFERENTIAL  ETHANOL  PROTIME-INR  APTT  COMPREHENSIVE METABOLIC PANEL WITH GFR  URINE DRUG SCREEN  I-STAT CHEM 8, ED                                                                                                                          EKG  EKG Interpretation Date/Time:  Tuesday June 25 2024 06:37:03 EDT Ventricular Rate:  62 PR Interval:  162 QRS Duration:  97 QT Interval:  414 QTC Calculation: 421 R Axis:   -7  Text Interpretation: Sinus rhythm Nonspecific T abnormalities, inferior leads No significant change was found Confirmed by Trine Likes [phone]) on 06/25/2024 6:40:12 AM       Radiology No results found.  Medications Ordered in ED Medications - No data to display Procedures Procedures  (including critical care time) Medical Decision Making / ED Course   Medical Decision Making Amount and/or Complexity of Data Reviewed Labs: ordered. Decision-making details documented in ED Course. Radiology: ordered and independent interpretation performed. Decision-making details documented in ED Course. ECG/medicine tests: ordered and independent interpretation performed. Decision-making details documented in ED Course.  Risk Prescription drug management. Decision regarding hospitalization.    Right sided numbness and weakness Last known normal 23:30 PM Out of the window for tPA.  Does not meet LVO criteria Will get stroke workup Will assess for electrolyte derangements.  Will also assess for infections that may trigger recrudescence from prior pontine stroke Spoke to Dr. Lindzen about adding CTA of the head neck to initial workup and he agreed.  He recommended obtaining an MRI and reconsulting if positive.  Labs reassuring without electrolyte derangements, metabolic derangements.  Currently awaiting CTA. Anticipate admission.  Patient care turned over to oncoming provider. Patient case and results discussed in detail; please see their note for further ED managment.       Final Clinical Impression(s) / ED Diagnoses Final diagnoses:  None    This chart was dictated using voice recognition software.  Despite best efforts to proofread,  errors can occur which can  change the documentation meaning.    Trine Raynell Moder, MD 06/25/24 [phone]

## 2024-06-25 NOTE — Evaluation (Signed)
 Occupational Therapy Evaluation Patient Details Name: Bianca Campbell MRN: [id number] DOB: [birth year] Today's Date: 06/25/2024   History of Present Illness   [name] is a 68 yr old female brought to the hospital with R sided numbness. A MRI of the brain revealed an acute infarct of the L thalamus. PMH: OA, back pain, GERD, gout, HLD, HTN, IBS, DM II, vertigo syndrome, exploratory laparotomy, R TKA     Clinical Impressions The pt is currently presenting below her baseline level of functioning for self-care management, as she is limited by the below listed deficits (see OT problem list). As such, her occupational performance is compromised and she requires assistance for self-care management. During the session, she required supervision for supine to sit, SBA for doffing and donning her socks seated EOB, and CGA to stand without an assistive device. She presented with and also subjectively reported feelings of impaired balance/instability with dynamic standing activity; she presented with improvement in this regard when introduced to the rolling walker. She also reported acute mild numbness of her RUE and RLE, tingling of her RUE and RLE, slight fatigue with activity, an acute cold sensation to her distal RLE, and mild lightheadedness with progressive activity. She will benefit from further OT services to maximize her safety and independence with ADLs and other meaningful activities. She expressed a desire to return home at discharge with home health therapy and family assistance.      If plan is discharge home, recommend the following:   A little help with walking and/or transfers;Help with stairs or ramp for entrance;Assist for transportation;Assistance with cooking/housework;A little help with bathing/dressing/bathroom     Functional Status Assessment   Patient has had a recent decline in their functional status and demonstrates the ability to make significant improvements in  function in a reasonable and predictable amount of time.     Equipment Recommendations   Tub/shower seat     Recommendations for Other Services         Precautions/Restrictions   Precautions Precautions: Fall Restrictions Weight Bearing Restrictions Per Provider Order: No     Mobility Bed Mobility Overal bed mobility: Needs Assistance Bed Mobility: Supine to Sit     Supine to sit: Supervision, HOB elevated, Used rails          Transfers Overall transfer level: Needs assistance Equipment used: None Transfers: Sit to/from Stand Sit to Stand: Contact guard assist                  Balance     Sitting balance-Leahy Scale: Good         Standing balance comment: CGA to min assist              ADL either performed or assessed with clinical judgement   ADL Overall ADL's : Needs assistance/impaired Eating/Feeding: Independent;Sitting Eating/Feeding Details (indicate cue type and reason): based on clinical judgement Grooming: Set up;Contact guard assist;Sitting;Standing Grooming Details (indicate cue type and reason): Set-up seated or CGA standing         Upper Body Dressing : Set up;Sitting   Lower Body Dressing: Contact guard assist;Sit to/from stand   Toilet Transfer: Contact guard assist;Rolling walker (2 wheels);Ambulation Toilet Transfer Details (indicate cue type and reason): at bathroom level, based on clinical judgement Toileting- Clothing Manipulation and Hygiene: Contact guard assist;Sit to/from stand Toileting - Clothing Manipulation Details (indicate cue type and reason): at bathroom level, based on clinical judgement  Vision Baseline Vision/History: 1 Wears glasses Additional Comments: She correctly read the time depicted on the wall clock. She denied having acute vision changes or deficits. Visual tracking was WNL.     Perception      Left/right discrimination WNL Handwriting WNL       Pertinent  Vitals/Pain Pain Assessment Pain Location: She denied having pain however reported discomfort of her entire R side.     Extremity/Trunk Assessment Upper Extremity Assessment Upper Extremity Assessment: RUE deficits/detail;LUE deficits/detail;Right hand dominant RUE Deficits / Details: AROM WFL. Shoulder flexion 4/5, elbow flexion 4+/5, elbow extension 4/5, grip strength 4+/5.  Digit opposition WFL, finger to nose WFL. Mild numbness of entire RUE reported, as well as tingling LUE Deficits / Details: AROM WFL. Gross strength 5/5. Digit opposition WFL, finger to nose [facility]   Lower Extremity Assessment Lower Extremity Assessment: RLE deficits/detail;LLE deficits/detail;Overall [facility] for tasks assessed RLE Deficits / Details:  (AROM WFL. Mild numbness with tingling reported. Pt also reported an acute cold sensation to her distal RLE) LLE Deficits / Details:  (AROM WFL)       Communication Communication Communication: No apparent difficulties   Cognition Arousal: Alert Behavior During Therapy: WFL for tasks assessed/performed               OT - Cognition Comments: Oriented x4                 Following commands: Intact       Cueing  General Comments   Cueing Techniques: Verbal cues              Home Living Family/patient expects to be discharged to:: Private residence Living Arrangements: Spouse/significant other Available Help at Discharge: Family;Available 24 hours/day Type of Home: House Home Access: Stairs to enter [employer] of Steps: 1+1   Home Layout: One level     Bathroom Shower/Tub: Walk-in shower;Tub/shower unit         Home Equipment: Agricultural consultant (2 wheels);Cane - single point   Additional Comments: Her spouse is able to assist if needed.      Prior Functioning/Environment Prior Level of Function : Independent/Modified Independent;Driving             Mobility Comments: Occasional use of a cane for ambulation. ADLs  Comments: Independent with ADLs, cooking, cleaning and driving.    OT Problem List: Decreased strength;Impaired balance (sitting and/or standing);Decreased knowledge of use of DME or AE;Impaired sensation   OT Treatment/Interventions: Self-care/ADL training;Therapeutic exercise;Therapeutic activities;Neuromuscular education;Energy conservation;Patient/family education;DME and/or AE instruction;Balance training      OT Goals(Current goals can be found in the care plan section)   Acute Rehab OT Goals OT Goal Formulation: With patient/family Time For Goal Achievement: 07/09/24 Potential to Achieve Goals: Good ADL Goals Pt Will Perform Grooming: with set-up;with supervision;standing Pt Will Perform Lower Body Dressing: with supervision;sitting/lateral leans;sit to/from stand Pt Will Transfer to Toilet: with supervision;ambulating Pt Will Perform Toileting - Clothing Manipulation and hygiene: with supervision;sit to/from stand   OT Frequency:  Min 2X/week       AM-PAC OT 6 Clicks Daily Activity     Outcome Measure Help from another person eating meals?: None Help from another person taking care of personal grooming?: A Little Help from another person toileting, which includes using toliet, bedpan, or urinal?: A Little Help from another person bathing (including washing, rinsing, drying)?: A Little Help from another person to put on and taking off regular upper body clothing?: A Little Help from another person  to put on and taking off regular lower body clothing?: A Little 6 Click Score: 19   End of Session Equipment Utilized During Treatment: Rolling walker (2 wheels) Nurse Communication: Mobility status  Activity Tolerance: Patient tolerated treatment well Patient left: in chair;with call bell/phone within reach;with family/visitor present  OT Visit Diagnosis: Unsteadiness on feet (R26.81);Other abnormalities of gait and mobility (R26.89);Muscle weakness (generalized) (M62.81)                 Time: 8379-8353 OT Time Calculation (min): 26 min Charges:  OT General Charges $OT Visit: 1 Visit OT Evaluation $OT Eval Moderate Complexity: 1 Mod OT Treatments $Therapeutic Activity: 8-22 mins    Delanna JINNY Lesches, OTR/L 06/25/2024, 5:26 PM

## 2024-06-25 NOTE — Plan of Care (Signed)
   Problem: Education: Goal: Knowledge of disease or condition will improve Outcome: Progressing Goal: Knowledge of secondary prevention will improve (MUST DOCUMENT ALL) Outcome: Progressing Goal: Knowledge of patient specific risk factors will improve (DELETE if not current risk factor) Outcome: Progressing

## 2024-06-26 ENCOUNTER — Other Ambulatory Visit (HOSPITAL_COMMUNITY): Payer: Self-pay

## 2024-06-26 DIAGNOSIS — R531 Weakness: Secondary | ICD-10-CM | POA: Diagnosis not present

## 2024-06-26 DIAGNOSIS — I639 Cerebral infarction, unspecified: Secondary | ICD-10-CM | POA: Diagnosis not present

## 2024-06-26 LAB — GLUCOSE, CAPILLARY
Glucose-Capillary: 130 mg/dL — ABNORMAL HIGH (ref 70–99)
Glucose-Capillary: 93 mg/dL (ref 70–99)

## 2024-06-26 LAB — CBC
HCT: 39 % (ref 36.0–46.0)
Hemoglobin: 12.1 g/dL (ref 12.0–15.0)
MCH: 25.8 pg — ABNORMAL LOW (ref 26.0–34.0)
MCHC: 31 g/dL (ref 30.0–36.0)
MCV: 83.2 fL (ref 80.0–100.0)
Platelets: 318 K/uL (ref 150–400)
RBC: 4.69 MIL/uL (ref 3.87–5.11)
RDW: 13.6 % (ref 11.5–15.5)
WBC: 4.3 K/uL (ref 4.0–10.5)
nRBC: 0 % (ref 0.0–0.2)

## 2024-06-26 LAB — COMPREHENSIVE METABOLIC PANEL WITH GFR
ALT: 12 U/L (ref 0–44)
AST: 17 U/L (ref 15–41)
Albumin: 4 g/dL (ref 3.5–5.0)
Alkaline Phosphatase: 95 U/L (ref 38–126)
Anion gap: 14 (ref 5–15)
BUN: 14 mg/dL (ref 8–23)
CO2: 21 mmol/L — ABNORMAL LOW (ref 22–32)
Calcium: 9.8 mg/dL (ref 8.9–10.3)
Chloride: 102 mmol/L (ref 98–111)
Creatinine, Ser: 0.62 mg/dL (ref 0.44–1.00)
GFR, Estimated: >60 mL/min (ref >60)
Glucose, Bld: 132 mg/dL — ABNORMAL HIGH (ref 70–99)
Potassium: 3.8 mmol/L (ref 3.5–5.1)
Sodium: 137 mmol/L (ref 135–145)
Total Bilirubin: 0.9 mg/dL (ref 0.0–1.2)
Total Protein: 7.6 g/dL (ref 6.5–8.1)

## 2024-06-26 LAB — HIV ANTIBODY (ROUTINE TESTING W REFLEX): HIV Screen 4th Generation wRfx: NONREACTIVE

## 2024-06-26 MED ORDER — CLOPIDOGREL BISULFATE 75 MG PO TABS
75.0000 mg | ORAL_TABLET | Freq: Every day | ORAL | 0 refills | Status: AC
  Filled 2024-06-26: qty 90, 90d supply, fill #0

## 2024-06-26 MED ORDER — INFLUENZA VAC SPLIT HIGH-DOSE 0.5 ML IM SUSY
0.5000 mL | PREFILLED_SYRINGE | Freq: Once | INTRAMUSCULAR | Status: AC
  Administered 2024-06-26: 0.5 mL via INTRAMUSCULAR
  Filled 2024-06-26: qty 0.5

## 2024-06-26 MED ORDER — ATORVASTATIN CALCIUM 20 MG PO TABS
20.0000 mg | ORAL_TABLET | Freq: Every day | ORAL | 3 refills | Status: DC
  Filled 2024-06-26: qty 90, 90d supply, fill #0

## 2024-06-26 NOTE — Discharge Summary (Addendum)
 Physician Discharge Summary   ERDINE HULEN FMW:[id number] DOB: 23-Dec-1955 DOA: 06/25/2024  PCP: Norleen Lynwood ORN, MD  Admit date: 06/25/2024 Discharge date: 06/26/2024  Admitted From: Home Disposition:  Home Discharging physician: Alm Apo, MD Barriers to discharge: none  Recommendations at discharge: Follow-up with neurology outpatient Repeat A1c in 3 months  Home Health: PT Equipment/Devices: RW, shower stool  Discharge Condition: stable CODE STATUS: Full  Diet recommendation:  Diet Orders (From admission, onward)     Start     Ordered   06/26/24 0000  Diet - low sodium heart healthy        06/26/24 1215   06/25/24 1544  Diet heart healthy/carb modified Fluid consistency: Thin  Diet effective now       Question:  Fluid consistency:  Answer:  Thin   06/25/24 1543            Hospital Course: Bianca Campbell is a 68 y.o. female with medical history significant of allergic rhinitis, MRSA carrier, osteoarthritis, chronic back pain, GERD, gout, hyperlipidemia, hypertension, irritable bowel syndrome, class III obesity, type 2 diabetes, vertigo syndrome origin, vitamin D  deficiency who presented to the emergency department complaints of right-sided numbness that started at 0430 today.  LKN was 2330 yesterday.  She has had some mild gait imbalance, blurry vision and earlier had a headache, that has resolved since.  She denied fever, chills, rhinorrhea, sore throat, wheezing or hemoptysis.  No chest pain, palpitations, diaphoresis, PND, orthopnea or pitting edema of the lower extremities.  No abdominal pain, nausea, emesis, diarrhea, constipation, melena or hematochezia.  No flank pain, dysuria, frequency or hematuria.  No polyuria, polydipsia or polyphagia.   Lab work: UDS was negative.  CBC, PT, INR and PTT were normal.  Alcohol level was unremarkable.  CMP showed a CO2 of 21 mmol/L with a normal anion gap and a glucose of 138 mg/dL, the rest of the CMP measurements were  normal.   Imaging: CTA head and neck negative for large vessel occlusion.  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  Intracranial atherosclerosis with mild to moderate stenosis of the supraclinoid right ICA siphon, right PCA P2 segment, and left PCA P3 segment.  There was mild left MCA M1 involvement.  There was no intracranial normality by CT.  Advanced C-spine degeneration.   MRI brain without contrast showed positive acute lacunar infarct in the left thalamus.  No associated hemorrhage or mass effect.  No other acute intracranial normality.  Mild to moderate for age cerebral white matter disease not significantly changed since 2019.    A&P:  Acute CVA  - Patient awoke with paresthesias involving most of the right side including facial distribution and right upper and lower extremities.  She did not endorse weakness however does have very mild right sided upper and lower weakness (4+/5).  - MRI brain confirmed left thalamic CVA -Evaluated by PT, OT, neurology - HHPT ordered at d/c - A1c 6.4%, LDL 85 - Cardiology arranging for 30-day monitor - Neurology recommending 3 months of aspirin /Plavix  followed by monotherapy aspirin  - Outpatient follow-up with neurology planned also - Lipitor initiated prior to discharge as well   Hyperlipidemia - LDL 85 - Continue Lipitor which was started during hospitalization   Essential hypertension -Home regimen resumed   GERD without esophagitis -Asymptomatic, no home medications noted   The patient's acute and chronic medical conditions were treated accordingly. On day of discharge, patient was felt deemed stable for discharge. Patient/family  member advised to call PCP or come back to ER if needed.   Principal Diagnosis: Acute CVA (cerebrovascular accident) [facility])  Discharge Diagnoses: Active Hospital Problems   Diagnosis Date Noted   Acute CVA (cerebrovascular accident) (HCC) 06/25/2024    Priority: 1.    Hyperlipidemia 03/13/2013    Priority: 3.   Acute right-sided weakness 06/25/2024   GERD without esophagitis 06/06/2007   Essential hypertension 06/06/2007    Resolved Hospital Problems  No resolved problems to display.     Discharge Instructions     Diet - low sodium heart healthy   Complete by: As directed    Increase activity slowly   Complete by: As directed       Allergies as of 06/26/2024       Reactions   Latex Itching        Medication List     STOP taking these medications    tiZANidine  4 MG tablet Commonly known as: ZANAFLEX        TAKE these medications    acetaminophen  500 MG tablet Commonly known as: TYLENOL  Take 1,000 mg by mouth daily as needed for mild pain (pain score 1-3), headache or moderate pain (pain score 4-6).   amLODipine  10 MG tablet Commonly known as: NORVASC  Take 1 tablet by mouth once daily   aspirin  EC 81 MG tablet Take 81 mg by mouth daily. Swallow whole.   atorvastatin  20 MG tablet Commonly known as: LIPITOR Take 1 tablet (20 mg total) by mouth daily. Start taking on: June 27, 2024   BERBERINE COMPLEX PO Take 2 capsules by mouth daily.   CHROMIUM PO Take 1 capsule by mouth daily.   clopidogrel  75 MG tablet Commonly known as: PLAVIX  Take 1 tablet (75 mg total) by mouth daily. Start taking on: June 27, 2024   irbesartan  300 MG tablet Commonly known as: AVAPRO  Take 1 tablet (300 mg total) by mouth daily.   Lancets Misc Use as directed four times per day E11.9   meclizine  25 MG tablet Commonly known as: ANTIVERT  Take 1 tablet (25 mg total) by mouth 3 (three) times daily as needed for dizziness.   meloxicam  15 MG tablet Commonly known as: MOBIC  TAKE 1 TABLET BY MOUTH ONCE DAILY AS NEEDED FOR PAIN   OneTouch Verio test strip Generic drug: glucose blood Use as instructed four time per day E11.9   OVER THE COUNTER MEDICATION Take 2 capsules by mouth daily. Glucose advance   VITAMIN C PO Take 1  tablet by mouth daily.   Vitamin D3 125 MCG (5000 UT) Caps Take 5,000 Units by mouth daily.               Durable Medical Equipment  (From admission, onward)           Start     Ordered   06/26/24 1215  For home use only DME Walker rolling  Once       Question Answer Comment  Walker: With 5 Inch Wheels   Patient needs a walker to treat with the following condition Generalized muscle weakness   Patient needs a walker to treat with the following condition Acute CVA (cerebrovascular accident) (HCC)      06/26/24 1215   06/26/24 0932  For home use only DME Shower stool  Once        06/26/24 0931            Allergies  Allergen Reactions   Latex Itching    Consultations: Neurology  Procedures:   Discharge Exam: BP 131/74 (BP Location: Right Arm)   Pulse 71   Temp 98.2 F (36.8 C) (Oral)   Resp 14   Ht 4' 11.5 (1.511 m)   Wt 105.2 kg   SpO2 97%   BMI 46.07 kg/m  Physical Exam Constitutional:      Appearance: Normal appearance.  HENT:     Head: Normocephalic and atraumatic.     Mouth/Throat:     Mouth: Mucous membranes are moist.  Eyes:     Extraocular Movements: Extraocular movements intact.  Cardiovascular:     Rate and Rhythm: Normal rate and regular rhythm.  Pulmonary:     Effort: Pulmonary effort is normal. No respiratory distress.     Breath sounds: Normal breath sounds. No wheezing.  Abdominal:     General: Bowel sounds are normal. There is no distension.     Palpations: Abdomen is soft.     Tenderness: There is no abdominal tenderness.  Musculoskeletal:        General: Normal range of motion.     Cervical back: Normal range of motion and neck supple.  Skin:    General: Skin is warm and dry.  Neurological:     Mental Status: She is alert and oriented to person, place, and time.     Sensory: Sensory deficit (paresthesia V1-V3 and involving RUE and RLE) present.     Motor: Weakness (4+/5 in RUE and RLE) present.  Psychiatric:         Mood and Affect: Mood normal.      The results of significant diagnostics from this hospitalization (including imaging, microbiology, ancillary and laboratory) are listed below for reference.   Microbiology: No results found for this or any previous visit (from the past 240 hours).   Labs: BNP (last 3 results) No results for input(s): BNP in the last 8760 hours. Basic Metabolic Panel: Recent Labs  Lab 06/25/24 0643 06/25/24 0654 06/26/24 0558  NA 140 139 137  K 3.8 3.7 3.8  CL 104 103 102  CO2 21*  --  21*  GLUCOSE 138* 141* 132*  BUN 15 16 14   CREATININE 0.62 0.60 0.62  CALCIUM  9.2  --  9.8   Liver Function Tests: Recent Labs  Lab 06/25/24 0643 06/26/24 0558  AST 19 17  ALT 15 12  ALKPHOS 97 95  BILITOT 0.4 0.9  PROT 7.9 7.6  ALBUMIN 4.2 4.0   No results for input(s): LIPASE, AMYLASE in the last 168 hours. No results for input(s): AMMONIA in the last 168 hours. CBC: Recent Labs  Lab 06/25/24 0643 06/25/24 0654 06/26/24 0558  WBC 6.7  --  4.3  NEUTROABS 4.0  --   --   HGB 12.2 13.6 12.1  HCT 38.4 40.0 39.0  MCV 83.7  --  83.2  PLT 296  --  318   Cardiac Enzymes: No results for input(s): CKTOTAL, CKMB, CKMBINDEX, TROPONINI in the last 168 hours. BNP: Invalid input(s): POCBNP CBG: Recent Labs  Lab 06/25/24 1852 06/25/24 2129 06/26/24 0745 06/26/24 1214  GLUCAP 111* 136* 130* 93   D-Dimer No results for input(s): DDIMER in the last 72 hours. Hgb A1c Recent Labs    06/25/24 1100  HGBA1C 6.4*   Lipid Profile Recent Labs    06/25/24 0553  CHOL 170  HDL 72  LDLCALC 85  TRIG 66  CHOLHDL 2.4   Thyroid  function studies No results for input(s): TSH, T4TOTAL, T3FREE, THYROIDAB in the last 72 hours.  Invalid input(s): FREET3 Anemia work up No results for input(s): VITAMINB12, FOLATE, FERRITIN, TIBC, IRON, RETICCTPCT in the last 72 hours. Urinalysis    Component Value Date/Time   COLORURINE YELLOW  11/02/2022 1525   APPEARANCEUR CLEAR 11/02/2022 1525   LABSPEC 1.020 11/02/2022 1525   PHURINE 6.0 11/02/2022 1525   GLUCOSEU NEGATIVE 11/02/2022 1525   HGBUR NEGATIVE 11/02/2022 1525   BILIRUBINUR NEGATIVE 11/02/2022 1525   KETONESUR NEGATIVE 11/02/2022 1525   PROTEINUR NEGATIVE 10/19/2021 2353   UROBILINOGEN 0.2 11/02/2022 1525   NITRITE NEGATIVE 11/02/2022 1525   LEUKOCYTESUR NEGATIVE 11/02/2022 1525   Sepsis Labs Recent Labs  Lab 06/25/24 0643 06/26/24 0558  WBC 6.7 4.3   Microbiology No results found for this or any previous visit (from the past 240 hours).  Procedures/Studies: ECHOCARDIOGRAM COMPLETE Result Date: 06/25/2024    ECHOCARDIOGRAM REPORT   Patient Name:   JASILYN HOLDERMAN Date of Exam: 06/25/2024 Medical Rec #:  [id number]        Height:       59.5 in Accession #:    [id number]       Weight:       232.0 lb Date of Birth:  [birth year]        BSA:          1.976 m Patient Age:    68 years         BP:           147/77 mmHg Patient Gender: F                HR:           54 bpm. Exam Location:  Inpatient Procedure: 2D Echo, Color Doppler and Cardiac Doppler (Both Spectral and Color            Flow Doppler were utilized during procedure). Indications:    Stroke I63.9  History:        Patient has no prior history of Echocardiogram examinations.  Sonographer:    Tinnie Gosling RDCS Referring Phys: [id number] [facility] IMPRESSIONS  1. Left ventricular ejection fraction, by estimation, is 60 to 65%. The left ventricle has normal function. The left ventricle has no regional wall motion abnormalities. Left ventricular diastolic parameters were normal.  2. Right ventricular systolic function is normal. The right ventricular size is normal. There is normal pulmonary artery systolic pressure. The estimated right ventricular systolic pressure is 19.8 mmHg.  3. Left atrial size was mild to moderately dilated.  4. The mitral valve is normal in structure. No evidence of mitral valve  regurgitation. No evidence of mitral stenosis.  5. The aortic valve is tricuspid. Aortic valve regurgitation is not visualized. No aortic stenosis is present.  6. The inferior vena cava is normal in size with greater than 50% respiratory variability, suggesting right atrial pressure of 3 mmHg. FINDINGS  Left Ventricle: Left ventricular ejection fraction, by estimation, is 60 to 65%. The left ventricle has normal function. The left ventricle has no regional wall motion abnormalities. The left ventricular internal cavity size was normal in size. There is  no left ventricular hypertrophy. Left ventricular diastolic parameters were normal. Right Ventricle: The right ventricular size is normal. No increase in right ventricular wall thickness. Right ventricular systolic function is normal. There is normal pulmonary artery systolic pressure. The tricuspid regurgitant velocity is 2.05 m/s, and  with an assumed right atrial pressure of 3 mmHg, the estimated right ventricular systolic pressure is 19.8 mmHg. Left Atrium: Left atrial  size was mild to moderately dilated. Right Atrium: Right atrial size was normal in size. Pericardium: There is no evidence of pericardial effusion. Mitral Valve: The mitral valve is normal in structure. No evidence of mitral valve regurgitation. No evidence of mitral valve stenosis. Tricuspid Valve: The tricuspid valve is normal in structure. Tricuspid valve regurgitation is mild. Aortic Valve: The aortic valve is tricuspid. Aortic valve regurgitation is not visualized. No aortic stenosis is present. Pulmonic Valve: The pulmonic valve was grossly normal. Pulmonic valve regurgitation is not visualized. No evidence of pulmonic stenosis. Aorta: The aortic root and ascending aorta are structurally normal, with no evidence of dilitation. Venous: The inferior vena cava is normal in size with greater than 50% respiratory variability, suggesting right atrial pressure of 3 mmHg. IAS/Shunts: The interatrial  septum was not well visualized.  LEFT VENTRICLE PLAX 2D LVIDd:         4.90 cm   Diastology LVIDs:         3.40 cm   LV e' medial:    5.98 cm/s LV PW:         0.90 cm   LV E/e' medial:  11.0 LV IVS:        1.00 cm   LV e' lateral:   9.79 cm/s LVOT diam:     2.00 cm   LV E/e' lateral: 6.7 LV SV:         61 LV SV Index:   31 LVOT Area:     3.14 cm  RIGHT VENTRICLE         IVC TAPSE (M-mode): 1.7 cm  IVC diam: 1.60 cm LEFT ATRIUM             Index LA diam:        3.90 cm 1.97 cm/m LA Vol (A2C):   50.0 ml 25.31 ml/m LA Vol (A4C):   61.8 ml 31.28 ml/m LA Biplane Vol: 60.3 ml 30.52 ml/m  AORTIC VALVE LVOT Vmax:   92.80 cm/s LVOT Vmean:  63.500 cm/s LVOT VTI:    0.193 m  AORTA Ao Root diam: 2.40 cm Ao Asc diam:  3.30 cm MITRAL VALVE               TRICUSPID VALVE MV Area (PHT): 2.56 cm    TR Peak grad:   16.8 mmHg MV E velocity: 66.00 cm/s  TR Vmax:        205.00 cm/s MV A velocity: 72.80 cm/s MV E/A ratio:  0.91        SHUNTS                            Systemic VTI:  0.19 m                            Systemic Diam: 2.00 cm Jerel Croitoru MD Electronically signed by Jerel Balding MD Signature Date/Time: 06/25/2024/3:10:19 PM    Final    MR BRAIN WO CONTRAST Result Date: 06/25/2024 CLINICAL DATA:  68 year old female with right side numbness, last known well 2330 hours. EXAM: MRI HEAD WITHOUT CONTRAST TECHNIQUE: Multiplanar, multiecho pulse sequences of the brain and surrounding structures were obtained without intravenous contrast. COMPARISON:  CT head and CTA head and neck today. Brain MRI 06/13/2018. FINDINGS: Brain: Subcentimeter restricted diffusion in the lateral left thalamus near the posterior limb left internal capsule (series 10, image 26). Minimal associated FLAIR hyperintensity series 14,  image 26. No associated hemorrhage or mass effect. No other restricted diffusion. No midline shift, mass effect, evidence of mass lesion, ventriculomegaly, extra-axial collection or acute intracranial hemorrhage.  Cervicomedullary junction and pituitary are within normal limits. Stable cerebral volume from last year. Scattered small bilateral cerebral white matter T2 and FLAIR hyperintense foci have not significantly changed. No cortical encephalomalacia or chronic cerebral blood products basal ganglia, right thalamus, cerebellum remain normal. Mild T2 heterogeneity in the central pons. Vascular: Major intracranial vascular flow voids are stable since 2019. Skull and upper cervical spine: Partially visible cervical spine degeneration. Background bone marrow signal within normal limits. Sinuses/Orbits: Improved left maxillary sinus disease since 2019. Negative orbits. Other: Mastoids are clear. IMPRESSION: 1. Positive acute lacunar infarct in the Left thalamus. No associated hemorrhage or mass effect. 2. No other acute intracranial abnormality. Mild to moderate for age cerebral white matter disease not significantly changed since 2019. Electronically Signed   By: VEAR Hurst M.D.   On: 06/25/2024 09:36   CT ANGIO HEAD NECK W WO CM Result Date: 06/25/2024 CLINICAL DATA:  68 year old female with right side numbness onset 0430 hours. EXAM: CT ANGIOGRAPHY HEAD AND NECK WITH AND WITHOUT CONTRAST TECHNIQUE: Multidetector CT imaging of the head and neck was performed using the standard protocol during bolus administration of intravenous contrast. Multiplanar CT image reconstructions and MIPs were obtained to evaluate the vascular anatomy. Carotid stenosis measurements (when applicable) are obtained utilizing NASCET criteria, using the distal internal carotid diameter as the denominator. RADIATION DOSE REDUCTION: This exam was performed according to the departmental dose-optimization program which includes automated exposure control, adjustment of the mA and/or kV according to patient size and/or use of iterative reconstruction technique. CONTRAST:  75mL OMNIPAQUE  IOHEXOL  350 MG/ML SOLN COMPARISON:  Brain MRI 08/13/2018. FINDINGS: CT  HEAD Brain: Cerebral volume appears stable since 2019, within normal limits for age. No midline shift, ventriculomegaly, mass effect, evidence of mass lesion, intracranial hemorrhage or evidence of cortically based acute infarction. Bulky dural calcifications along the tentorium. Patchy cerebral white matter hypodensity in both hemispheres. Calvarium and skull base: Intact. Paranasal sinuses: Tympanic cavities, Visualized paranasal sinuses and mastoids are clear. Orbits: Visualized orbits and scalp soft tissues are within normal limits. CTA NECK Skeleton: Reversal of the normal cervical lordosis with advanced cervical disc and endplate degeneration below C3. Multilevel vacuum disc, endplate spurring and sclerosis. No acute osseous abnormality identified. Upper chest: Low lung volumes with some atelectatic changes to the central airways, mild upper lung mosaic attenuation. Negative visible superior mediastinum aside from great vessel tortuosity. Other neck: Retropharyngeal course of both carotids. Nonvascular neck soft tissue spaces otherwise appear negative. Aortic arch: 3 vessel arch with mild tortuosity. Generalized tortuosity of the proximal great vessels. Minimal arch atherosclerosis. Right carotid system: Tortuous brachiocephalic artery and proximal right CCA without plaque or stenosis. Retropharyngeal right carotid including the bifurcation and proximal right ICA. No significant plaque or stenosis. Left carotid system: Similar tortuosity and retropharyngeal course with no significant plaque or stenosis. Vertebral arteries: Mild atherosclerosis of the right subclavian artery origin. Proximal right subclavian tortuosity without stenosis. But soft plaque and severe stenosis at the right vertebral artery origin visible on series 8, images 107 and 108. The vessel remains patent, is tortuous in the neck and without additional plaque or stenosis to the skull base. Proximal left subclavian soft plaque and mild  tortuosity without stenosis. No left vertebral origin stenosis. Tortuous left V1 segment. Codominant left vertebral artery with tortuosity in the neck is patent  to the skull base with no significant plaque or stenosis. CTA HEAD Posterior circulation: Patent distal vertebral arteries and vertebrobasilar junction with no significant plaque or stenosis. Normal left PICA origin. Right AICA may be dominant. Patent basilar artery without stenosis. Patent SCA and PCA origins. Posterior communicating arteries are diminutive or absent. Bilateral PCA branches are patent. Right PCA P2 segment mild to moderate irregularity and stenosis series 12, image 21. Left PCA inferior P3 division similar mild to moderate irregularity and stenosis (image 26). Anterior circulation: Both ICA siphons are patent. Left siphon mild to moderate calcified plaque with no significant stenosis. Right siphon bulky calcified supraclinoid segment plaque. Mild to moderate supraclinoid right ICA stenosis (series 7, image 120). Mild additional right siphon plaque. Patent carotid termini. Normal MCA and ACA origins. Diminutive anterior communicating artery. Bilateral ACA branches are within normal limits. Left MCA M1 segment is patent with mild irregularity, mild stenosis (series 10, image 23). Patent left carotid bifurcation with mild irregularity. Left MCA branches otherwise patent and within normal limits. Right MCA M1 segment is patent without stenosis. Patent right MCA bifurcation without stenosis. Right MCA branches are within normal limits. Venous sinuses: Early contrast timing, not well evaluated. Anatomic variants: None. Review of the MIP images confirms the above findings IMPRESSION: 1. Negative for large vessel occlusion. 2. 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 Intracranial atherosclerosis with mild to moderate stenosis of the supraclinoid Right ICA siphon, Right PCA P2 segment, and Left PCA P3 segment. Mild left  MCA M1 involvement. 3. No acute intracranial abnormality by CT. 4. Advanced cervical spine degeneration. Electronically Signed   By: VEAR Hurst M.D.   On: 06/25/2024 07:41     Time coordinating discharge: Over 30 minutes    Alm Apo, MD  [facility] 06/26/2024, 1:06 PM

## 2024-06-26 NOTE — TOC Transition Note (Signed)
 Transition of Care [facility]) - Discharge Note   Patient Details  Name: Bianca Campbell MRN: [id number] Date of Birth: [birth year]  Transition of Care [facility]) CM/SW Contact:  Tawni CHRISTELLA Eva, LCSW Phone Number: 06/26/2024, 1:11 PM   Clinical Narrative:     CSW met with to discuss home health recommendations. Pt is agreeable with no preference in agency. Pt has declined rolling walker stated that she has walker at home already. Pt with no transportation needs.   [facility] accepted pt for HHPT. IP care management sign off.  Final next level of care: Home w Home Health Services Barriers to Discharge: Barriers Resolved   Patient Goals and CMS Choice Patient states their goals for this hospitalization and ongoing recovery are:: retrun home with home health CMS Medicare.gov Compare Post Acute Care list provided to:: Patient Choice offered to / list presented to : Patient      Discharge Placement                       Discharge Plan and Services Additional resources added to the After Visit Summary for                            [facility] Arranged: PT HH Agency: [facility] Date [facility] Agency Contacted: 06/26/24 Time HH Agency Contacted: 1300 Representative spoke with at [facility] Agency: angie  Social Drivers of Health (SDOH) Interventions SDOH Screenings   Food Insecurity: No Food Insecurity (06/25/2024)  Housing: Low Risk  (06/25/2024)  Transportation Needs: No Transportation Needs (06/25/2024)  Utilities: Not At Risk (06/25/2024)  Alcohol Screen: Low Risk  (06/20/2023)  Depression (PHQ2-9): Low Risk  (08/15/2023)  Financial Resource Strain: Low Risk  (06/20/2023)  Physical Activity: Sufficiently Active (06/20/2023)  Social Connections: Moderately Integrated (06/25/2024)  Stress: No Stress Concern Present (06/20/2023)  Tobacco Use: Low Risk  (06/25/2024)  Health Literacy: Adequate Health Literacy (06/20/2023)     Readmission Risk Interventions     No data to display

## 2024-06-26 NOTE — Evaluation (Signed)
 Physical Therapy Evaluation Patient Details Name: Bianca Campbell MRN: [id number] DOB: 1956/03/02 Today's Date: 06/26/2024  History of Present Illness  Bianca Campbell is a 68 yr old female brought to the hospital with R sided numbness. A MRI of the brain revealed an acute infarct of the L thalamus. PMH: OA, back pain, GERD, gout, HLD, HTN, IBS, DM II, vertigo syndrome, exploratory laparotomy, R TKA  Clinical Impression   Pt admitted with above diagnosis.  Pt currently with functional limitations due to the deficits listed below (see PT Problem List). Pt will benefit from acute skilled PT to increase their independence and safety with mobility to allow discharge.     The patient reports abnormal tingling sensations in right entire body, including face. Patient has [facility] strength of the RLE and is ambulating with RW. Patient  will benefit from HHPT at D[facility], has family support to assist with  ADL's as needed.  Patient did report dizziness when turning, states not similar to vertigo. BP 148/86. Patient hopes for Dc home today.        If plan is discharge home, recommend the following: A little help with walking and/or transfers;A little help with bathing/dressing/bathroom;Assist for transportation;Help with stairs or ramp for entrance;Assistance with cooking/housework   Can travel by private Tax inspector (2 wheels)  Recommendations for Other Services       Functional Status Assessment Patient has had a recent decline in their functional status and demonstrates the ability to make significant improvements in function in a reasonable and predictable amount of time.     Precautions / Restrictions Precautions Precautions: Fall Restrictions Weight Bearing Restrictions Per Provider Order: No      Mobility  Bed Mobility   Bed Mobility: Supine to Sit, Sit to Supine     Supine to sit: Supervision, HOB elevated Sit to supine: Supervision, HOB  elevated   General bed mobility comments: slow to move right leg to bed edge and place onto bed    Transfers Overall transfer level: Needs assistance Equipment used: Rolling walker (2 wheels) Transfers: Sit to/from Stand Sit to Stand: Contact guard assist           General transfer comment: from bed and toilet-use of rail    Ambulation/Gait Ambulation/Gait assistance: Contact guard assist Gait Distance (Feet): 20 Feet (80) Assistive device: Rolling walker (2 wheels) Gait Pattern/deviations: Step-to pattern, Step-through pattern, Decreased step length - right Gait velocity: dec     General Gait Details: gait slow, at times decreased R leg  advancement  Stairs            Wheelchair Mobility     Tilt Bed    Modified Rankin (Stroke Patients Only)       Balance Overall balance assessment: Needs assistance Sitting-balance support: No upper extremity supported, Feet supported Sitting balance-Leahy Scale: Good     Standing balance support: No upper extremity supported, During functional activity Standing balance-Leahy Scale: Fair                               Pertinent Vitals/Pain Pain Assessment Pain Location: She denied having pain however reported discomfort of her entire R side. Pain Descriptors / Indicators: Burning, Pins and needles    Home Living Family/patient expects to be discharged to:: Private residence Living Arrangements: Spouse/significant other Available Help at Discharge: Family;Available 24 hours/day Type of Home: House Home Access:  Stairs to enter Entrance Stairs-Rails: Right Entrance Stairs-Number of Steps: 1+1   Home Layout: One level Home Equipment: Agricultural consultant (2 wheels);Cane - single point Additional Comments: Her spouse is able to assist if needed.    Prior Function Prior Level of Function : Independent/Modified Independent;Driving             Mobility Comments: Occasional use of a cane for  ambulation. ADLs Comments: Independent with ADLs, cooking, cleaning and driving.     Extremity/Trunk Assessment   Upper Extremity Assessment Upper Extremity Assessment: Defer to OT evaluation    Lower Extremity Assessment Lower Extremity Assessment: RLE deficits/detail RLE Deficits / Details: impaired extinction at foot, detects pressure LT on leg, movements are slow but WFL strength RLE Sensation: decreased proprioception RLE Coordination: decreased gross motor;decreased fine motor       Communication   Communication Communication: No apparent difficulties    Cognition Arousal: Alert Behavior During Therapy: WFL for tasks assessed/performed                                     Cueing       General Comments      Exercises     Assessment/Plan    PT Assessment Patient needs continued PT services  PT Problem List Decreased strength;Decreased knowledge of use of DME;Decreased activity tolerance;Decreased balance;Decreased knowledge of precautions;Decreased mobility;Impaired sensation       PT Treatment Interventions DME instruction;Gait training;Functional mobility training;Therapeutic activities;Balance training;Therapeutic exercise;Patient/family education    PT Goals (Current goals can be found in the Care Plan section)  Acute Rehab PT Goals Patient Stated Goal: go home PT Goal Formulation: With patient/family Time For Goal Achievement: 07/10/24 Potential to Achieve Goals: Good    Frequency Min 3X/week     Co-evaluation               AM-PAC PT 6 Clicks Mobility  Outcome Measure Help needed turning from your back to your side while in a flat bed without using bedrails?: None Help needed moving from lying on your back to sitting on the side of a flat bed without using bedrails?: None Help needed moving to and from a bed to a chair (including a wheelchair)?: A Little Help needed standing up from a chair using your arms (e.g., wheelchair  or bedside chair)?: A Little Help needed to walk in hospital room?: A Little Help needed climbing 3-5 steps with a railing? : A Lot 6 Click Score: 19    End of Session Equipment Utilized During Treatment: Gait belt Activity Tolerance: Patient tolerated treatment well Patient left: in bed;with call bell/phone within reach;with family/visitor present Nurse Communication: Mobility status PT Visit Diagnosis: Unsteadiness on feet (R26.81);Other abnormalities of gait and mobility (R26.89);Difficulty in walking, not elsewhere classified (R26.2);Other symptoms and signs involving the nervous system (R29.898);Muscle weakness (generalized) (M62.81);Hemiplegia and hemiparesis;Dizziness and giddiness (R42) Hemiplegia - Right/Left: Right Hemiplegia - dominant/non-dominant: Dominant Hemiplegia - caused by: Unspecified    Time: 1129-1200 PT Time Calculation (min) (ACUTE ONLY): 31 min   Charges:   PT Evaluation $PT Eval Low Complexity: 1 Low PT Treatments $Gait Training: 8-22 mins PT General Charges $$ ACUTE PT VISIT: 1 Visit         Darice Potters PT Acute Rehabilitation Services Office [phone]   Potters Darice Norris 06/26/2024, 12:36 PM

## 2024-06-26 NOTE — Hospital Course (Addendum)
 Bianca Campbell is a 68 y.o. female with medical history significant of allergic rhinitis, MRSA carrier, osteoarthritis, chronic back pain, GERD, gout, hyperlipidemia, hypertension, irritable bowel syndrome, class III obesity, type 2 diabetes, vertigo syndrome origin, vitamin D  deficiency who presented to the emergency department complaints of right-sided numbness that started at 0430 today.  LKN was 2330 yesterday.  She has had some mild gait imbalance, blurry vision and earlier had a headache, that has resolved since.  She denied fever, chills, rhinorrhea, sore throat, wheezing or hemoptysis.  No chest pain, palpitations, diaphoresis, PND, orthopnea or pitting edema of the lower extremities.  No abdominal pain, nausea, emesis, diarrhea, constipation, melena or hematochezia.  No flank pain, dysuria, frequency or hematuria.  No polyuria, polydipsia or polyphagia.   Lab work: UDS was negative.  CBC, PT, INR and PTT were normal.  Alcohol level was unremarkable.  CMP showed a CO2 of 21 mmol/L with a normal anion gap and a glucose of 138 mg/dL, the rest of the CMP measurements were normal.   Imaging: CTA head and neck negative for large vessel occlusion.  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  Intracranial atherosclerosis with mild to moderate stenosis of the supraclinoid right ICA siphon, right PCA P2 segment, and left PCA P3 segment.  There was mild left MCA M1 involvement.  There was no intracranial normality by CT.  Advanced C-spine degeneration.   MRI brain without contrast showed positive acute lacunar infarct in the left thalamus.  No associated hemorrhage or mass effect.  No other acute intracranial normality.  Mild to moderate for age cerebral white matter disease not significantly changed since 2019.    A&P:  Acute CVA  - Patient awoke with paresthesias involving most of the right side including facial distribution and right upper and lower extremities.   She did not endorse weakness however does have very mild right sided upper and lower weakness (4+/5).  - MRI brain confirmed left thalamic CVA -Evaluated by PT, OT, neurology - HHPT ordered at d/c - A1c 6.4%, LDL 85 - Cardiology arranging for 30-day monitor - Neurology recommending 3 months of aspirin /Plavix  followed by monotherapy aspirin  - Outpatient follow-up with neurology planned also - Lipitor initiated prior to discharge as well   Hyperlipidemia - LDL 85 - Continue Lipitor which was started during hospitalization   Essential hypertension -Home regimen resumed   GERD without esophagitis -Asymptomatic, no home medications noted

## 2024-06-26 NOTE — Care Management Obs Status (Signed)
 MEDICARE OBSERVATION STATUS NOTIFICATION   Patient Details  Name: Bianca Campbell MRN: [id number] Date of Birth: 14-Apr-1956   Medicare Observation Status Notification Given:  Yes    Tawni CHRISTELLA Eva, LCSW 06/26/2024, 1:11 PM

## 2024-06-26 NOTE — Progress Notes (Signed)
 Discharge meds in a secure bag delivered to patient in room by this RN

## 2024-06-27 ENCOUNTER — Other Ambulatory Visit (HOSPITAL_BASED_OUTPATIENT_CLINIC_OR_DEPARTMENT_OTHER): Payer: Self-pay

## 2024-06-28 ENCOUNTER — Telehealth: Payer: Self-pay | Admitting: Radiology

## 2024-06-28 NOTE — Telephone Encounter (Signed)
 Copied from CRM #[id number]. Topic: Clinical - Home Health Verbal Orders >> Jun 28, 2024  2:40 PM [place] A wrote: Caller/Agency: Liji with [facility] Callback Number: [phone] Service Requested: Physical Therapy Frequency: 1x week 1,  2x a week for 3 weeks, 1x for 3 weeks Any new concerns about the patient? No

## 2024-07-01 ENCOUNTER — Ambulatory Visit: Payer: Self-pay

## 2024-07-01 ENCOUNTER — Telehealth: Payer: Self-pay | Admitting: Radiology

## 2024-07-01 NOTE — Telephone Encounter (Signed)
 FYI Only or Action Required?: FYI only for provider.  Patient was last seen in primary care on 07/17/2023 by Norleen Lynwood ORN, MD.  Called Nurse Triage reporting Tingling.  Symptoms began a week ago.  Interventions attempted: Rest, hydration, or home remedies.  Symptoms are: unchanged.  Triage Disposition: See PCP When Office is Open (Within 3 Days)  Patient/caregiver understands and will follow disposition?: Yes   Copied from CRM [id number]. Topic: Clinical - Red Word Triage >> Jul 01, 2024  4:05 PM Burnard DEL wrote: Red Word that prompted transfer to Nurse Triage: tingling feeling on right side after stroke Reason for Disposition  [1] Numbness or tingling on both sides of body AND [2] is a new symptom present > 24 hours  Answer Assessment - Initial Assessment Questions 1. SYMPTOM: What is the main symptom you are concerned about? (e.g., weakness, numbness)     Tingling on entire right side occurring since the stroke occurred on Tuesday of last week. Patient was hospitalized. 2. ONSET: When did this start? (e.g., minutes, hours, days; while sleeping)     Last Tuesday when she had the stroke 3. LAST NORMAL: When was the last time you (the patient) were normal (no symptoms)?     Last Tuesday before her stroke. Patient was hospitalized. 4. PATTERN Does this come and go, or has it been constant since it started?  Is it present now?     Constant 5. CARDIAC SYMPTOMS: Have you had any of the following symptoms: chest pain, difficulty breathing, palpitations?     No 6. NEUROLOGIC SYMPTOMS: Have you had any of the following symptoms: headache, dizziness, vision loss, double vision, changes in speech, unsteady on your feet?     Patient denies new symptoms but states off and on, just very minimal lightheadedness that she had while hospitalized 7. OTHER SYMPTOMS: Do you have any other symptoms?     No  Protocols used: Neurologic Deficit-A-AH

## 2024-07-01 NOTE — Telephone Encounter (Signed)
 Copied from CRM [id number]. Topic: Clinical - Home Health Verbal Orders >> Jul 01, 2024  1:23 PM [place] wrote: Paralee with [facility] # [phone] calling to check on verbal orders, wanting to start seeing patient asap

## 2024-07-02 ENCOUNTER — Encounter: Payer: Self-pay | Admitting: Internal Medicine

## 2024-07-02 ENCOUNTER — Ambulatory Visit: Admitting: Internal Medicine

## 2024-07-02 VITALS — BP 118/80 | HR 72 | Temp 98.2°F | Ht 59.5 in | Wt 220.6 lb

## 2024-07-02 DIAGNOSIS — Z8673 Personal history of transient ischemic attack (TIA), and cerebral infarction without residual deficits: Secondary | ICD-10-CM

## 2024-07-02 DIAGNOSIS — I10 Essential (primary) hypertension: Secondary | ICD-10-CM | POA: Diagnosis not present

## 2024-07-02 DIAGNOSIS — E7800 Pure hypercholesterolemia, unspecified: Secondary | ICD-10-CM

## 2024-07-02 DIAGNOSIS — E1165 Type 2 diabetes mellitus with hyperglycemia: Secondary | ICD-10-CM

## 2024-07-02 MED ORDER — AMLODIPINE BESYLATE 10 MG PO TABS: 10.0000 mg | ORAL_TABLET | Freq: Every day | ORAL | 3 refills | Status: DC

## 2024-07-02 NOTE — Patient Instructions (Addendum)
 Please continue all other medications as before, including the aspirin  and plavix  for 3 months, then asa 81 mg per day alone after that.  The amlodipine  was refilled  Please have the pharmacy call with any other refills you may need.  Please continue your efforts at being more active, low cholesterol diet, and weight control.  Please keep your appointments with your specialists as you may have planned - Home PT, and Neurology  Please make an Appointment to return in 3 months, or sooner if needed

## 2024-07-02 NOTE — Assessment & Plan Note (Signed)
 Lab Results  Component Value Date   HGBA1C 6.4 (H) 06/25/2024   Stable, pt to continue current medical treatment  - diet, wt control

## 2024-07-02 NOTE — Assessment & Plan Note (Signed)
 Lab Results  Component Value Date   LDLCALC 85 06/25/2024   Uncontrolled, pt to continue current statin lipitor 20 mg every day, and ROV in 3 mo to reassess

## 2024-07-02 NOTE — Assessment & Plan Note (Signed)
 BP Readings from Last 3 Encounters:  07/02/24 118/80  06/26/24 131/74  07/17/23 128/82   Stable, pt to continue medical treatment avapro  300 mg every day, norvasc  10 qd

## 2024-07-02 NOTE — Assessment & Plan Note (Signed)
 Left thalamic with right sided weakness and decreased sensation, overall stable, to continue asa/plavix  x 3 mo then asa alone after that, continue new lipitor 20 mg every day, also follow up with PT as scheduled, as well as Neurology as planned. Cardiac monitor in place and pending.

## 2024-07-02 NOTE — Progress Notes (Signed)
 Patient ID: Bianca Campbell, female   DOB: Feb 16, 1956, 68 y.o.   MRN: [id number]        Chief Complaint: follow up post hospn sept 9 - 10 with acute left thalamic stroke and right sided tingling.         HPI:  Bianca Campbell is a 68 y.o. female here with husband, after above hospn, now overall doing well, has persistent right sided tingling and reduced sensation to a kind of pressure sensation , especially the right foot.  Feels funny to walk, is moving slowly, and she believes mild right hand reduced grip strength.  No falls. PT has been to the house for preliminary, with PT to start soon.  Pt denies chest pain, increased sob or doe, wheezing, orthopnea, PND, increased LE swelling, palpitations, dizziness or syncope.  Is wearing cardiac event monitor x 4 wks now.  Good compliance with meds, including the asa plavix  for 3 months with asa 81 mg after.  Most recent LDL LDL was 85 during hospn, and lipitor started.  .   Lab Results  Component Value Date   HGBA1C 6.4 (H) 06/25/2024  Sugar has been overall well controlled.  Has Neurology f/u appt next month.          Wt Readings from Last 3 Encounters:  07/02/24 220 lb 9.6 oz (100.1 kg)  06/25/24 232 lb (105.2 kg)  06/25/24 232 lb (105.2 kg)   BP Readings from Last 3 Encounters:  07/02/24 118/80  06/26/24 131/74  07/17/23 128/82         Past Medical History:  Diagnosis Date   ALLERGIC RHINITIS 09/11/2007   Anemia, unspecified 08/31/2012   Arthritis    knees (11/23/2016)   BACK PAIN    EXTERNAL OTITIS 09/11/2007   GERD 06/06/2007   GOUT 11/14/2007   High cholesterol    HYPERTENSION 06/06/2007   INTERMITTENT VERTIGO 09/11/2007   Irritable bowel syndrome 06/06/2007   LOW BACK PAIN 06/06/2007   OSTEOARTHRITIS 06/06/2007   PONV (postoperative nausea and vomiting)    Primary localized osteoarthritis of right knee    SHOULDER PAIN, RIGHT 12/30/2008   Vertigo of central origin 06/20/2018   Vitamin D  deficiency 03/13/2013   Past  Surgical History:  Procedure Laterality Date   63 HOUR PH STUDY N/A 05/27/2019   Procedure: 24 HOUR PH STUDY;  Surgeon: Albertus Gordy HERO, MD;  Location: [facility];  Service: Gastroenterology;  Laterality: N/A;   BILATERAL SALPINGOOPHORECTOMY     CESAREAN SECTION  1987, 1991   COLONOSCOPY     pt stated 2015 or 2016 unknown md   ESOPHAGEAL MANOMETRY N/A 05/27/2019   Procedure: ESOPHAGEAL MANOMETRY (EM) WITH IMPEDENCE STUDY;  Surgeon: Albertus Gordy HERO, MD;  Location: [facility];  Service: Gastroenterology;  Laterality: N/A;   EXPLORATORY LAPAROTOMY     bowel obstruction   INGUINAL HERNIA REPAIR Bilateral    numerous hernia repairs 1996 to 2011   JOINT REPLACEMENT     KNEE ARTHROSCOPY Bilateral 2013 - 11/11/2015   LAPAROSCOPIC CHOLECYSTECTOMY  1991   s/p incarcerated hernia and ovary cyst Jan 2011  2011   TOTAL KNEE ARTHROPLASTY Right 11/21/2016   Procedure: RIGHT TOTAL KNEE ARTHROPLASTY;  Surgeon: Lamar Millman, MD;  Location: [facility];  Service: Orthopedics;  Laterality: Right;   UMBILICAL HERNIA REPAIR     numerous hernia repairs 1996 to 2011    reports that she has never smoked. She has never used smokeless tobacco. She reports that she does not drink  alcohol and does not use drugs. family history includes Arthritis in her mother; Asthma in her daughter; Colon polyps (age of onset: 42) in her father; Diabetes in her mother; Healthy in her brother, daughter, sister, and son; Seizures in her mother. Allergies  Allergen Reactions   Latex Itching   Current Outpatient Medications on File Prior to Visit  Medication Sig Dispense Refill   acetaminophen  (TYLENOL ) 500 MG tablet Take 1,000 mg by mouth daily as needed for mild pain (pain score 1-3), headache or moderate pain (pain score 4-6).     Ascorbic Acid (VITAMIN C PO) Take 1 tablet by mouth daily.     aspirin  EC 81 MG tablet Take 81 mg by mouth daily. Swallow whole.     atorvastatin  (LIPITOR) 20 MG tablet Take 1 tablet (20 mg total) by mouth  daily. 90 tablet 3   Barberry-Oreg Grape-Goldenseal (BERBERINE COMPLEX PO) Take 2 capsules by mouth daily.     Cholecalciferol  (VITAMIN D3) 5000 units CAPS Take 5,000 Units by mouth daily.     CHROMIUM PO Take 1 capsule by mouth daily.     CINNAMON PO Take 250 mg by mouth 1 day or 1 dose.     clopidogrel  (PLAVIX ) 75 MG tablet Take 1 tablet (75 mg total) by mouth daily. 90 tablet 0   glucose blood (ONETOUCH VERIO) test strip Use as instructed four time per day E11.9 400 each 12   irbesartan  (AVAPRO ) 300 MG tablet Take 1 tablet (300 mg total) by mouth daily. 90 tablet 0   Lancets MISC Use as directed four times per day E11.9 400 each 11   meclizine  (ANTIVERT ) 25 MG tablet Take 1 tablet (25 mg total) by mouth 3 (three) times daily as needed for dizziness. 30 tablet 5   meloxicam  (MOBIC ) 15 MG tablet TAKE 1 TABLET BY MOUTH ONCE DAILY AS NEEDED FOR PAIN 90 tablet 3   OVER THE COUNTER MEDICATION Take 2 capsules by mouth daily. Glucose advance     No current facility-administered medications on file prior to visit.        ROS:  All others reviewed and negative.  Objective        PE:  BP 118/80   Pulse 72   Temp 98.2 F (36.8 C)   Ht 4' 11.5 (1.511 m)   Wt 220 lb 9.6 oz (100.1 kg)   SpO2 96%   BMI 43.81 kg/m                 Constitutional: Pt appears in NAD               HENT: Head: NCAT.                Right Ear: External ear normal.                 Left Ear: External ear normal.                Eyes: . Pupils are equal, round, and reactive to light. Conjunctivae and EOM are normal               Nose: without d/c or deformity               Neck: Neck supple. Gross normal ROM               Cardiovascular: Normal rate and regular rhythm.                 Pulmonary/Chest:  Effort normal and breath sounds without rales or wheezing.                Abd:  Soft, NT, ND, + BS, no organomegaly               Neurological: Pt is alert. At baseline orientation, motor with 4+/5 right grip, with  decreased sens to LT to RLE and RUE               Skin: Skin is warm. No rashes, no other new lesions, LE edema - none               Psychiatric: Pt behavior is normal without agitation   Micro: none  Cardiac tracings I have personally interpreted today:  none  Pertinent Radiological findings (summarize): none   Lab Results  Component Value Date   WBC 4.3 06/26/2024   HGB 12.1 06/26/2024   HCT 39.0 06/26/2024   PLT 318 06/26/2024   GLUCOSE 132 (H) 06/26/2024   CHOL 170 06/25/2024   TRIG 66 06/25/2024   HDL 72 06/25/2024   LDLDIRECT 114.4 03/13/2013   LDLCALC 85 06/25/2024   ALT 12 06/26/2024   AST 17 06/26/2024   NA 137 06/26/2024   K 3.8 06/26/2024   CL 102 06/26/2024   CREATININE 0.62 06/26/2024   BUN 14 06/26/2024   CO2 21 (L) 06/26/2024   TSH 1.82 11/02/2022   INR 1.0 06/25/2024   HGBA1C 6.4 (H) 06/25/2024   Assessment/Plan:  Bianca Campbell is a 68 y.o. Black or African American [2] female with  has a past medical history of ALLERGIC RHINITIS (09/11/2007), Anemia, unspecified (08/31/2012), Arthritis, BACK PAIN, EXTERNAL OTITIS (09/11/2007), GERD (06/06/2007), GOUT (11/14/2007), High cholesterol, HYPERTENSION (06/06/2007), INTERMITTENT VERTIGO (09/11/2007), Irritable bowel syndrome (06/06/2007), LOW BACK PAIN (06/06/2007), OSTEOARTHRITIS (06/06/2007), PONV (postoperative nausea and vomiting), Primary localized osteoarthritis of right knee, SHOULDER PAIN, RIGHT (12/30/2008), Vertigo of central origin (06/20/2018), and Vitamin D  deficiency (03/13/2013).  History of stroke Left thalamic with right sided weakness and decreased sensation, overall stable, to continue asa/plavix  x 3 mo then asa alone after that, continue new lipitor 20 mg every day, also follow up with PT as scheduled, as well as Neurology as planned. Cardiac monitor in place and pending.    Diabetes (HCC) Lab Results  Component Value Date   HGBA1C 6.4 (H) 06/25/2024   Stable, pt to continue current medical  treatment  - diet, wt control   Essential hypertension BP Readings from Last 3 Encounters:  07/02/24 118/80  06/26/24 131/74  07/17/23 128/82   Stable, pt to continue medical treatment avapro  300 mg every day, norvasc  10 qd   Hyperlipidemia Lab Results  Component Value Date   LDLCALC 85 06/25/2024   Uncontrolled, pt to continue current statin lipitor 20 mg every day, and ROV in 3 mo to reassess  Followup: Return in about 3 months (around 10/01/2024).  Lynwood Rush, MD 07/02/2024 7:57 PM [facility]

## 2024-07-03 ENCOUNTER — Telehealth: Payer: Self-pay | Admitting: Radiology

## 2024-07-03 NOTE — Telephone Encounter (Signed)
 Copied from CRM [id number]. Topic: General - Other >> Jul 03, 2024  9:24 AM Berneda FALCON wrote: Reason for CRM: Liji from [facility] is calling to check on the verbal orders for PT, wanting to start this ASAP, has been calling since Friday. Can we please double check on this order for her and get this in process so patient can begin PT.  [phone] (confidential line).

## 2024-07-04 ENCOUNTER — Telehealth: Payer: Self-pay | Admitting: Radiology

## 2024-07-04 NOTE — Telephone Encounter (Signed)
 Called and spoke with Liji confirming verbal orders. They will start seeing patient starting next week

## 2024-07-04 NOTE — Telephone Encounter (Signed)
 Copied from CRM [id number]. Topic: General - Call Back - No Documentation >> Jul 04, 2024 11:04 AM Rea ORN wrote: Reason for CRM: Liji with [facility] called to check status of verbal order for physical therapy. She originally called 9/12, then again 9/15, and 9/17. She needs the office to respond to the request as soon as possible.  Please call back [phone]

## 2024-07-04 NOTE — Telephone Encounter (Signed)
 Ok for [employer]

## 2024-07-05 ENCOUNTER — Telehealth: Payer: Self-pay

## 2024-07-05 NOTE — Telephone Encounter (Signed)
 Copied from CRM [id number]. Topic: Clinical - Medication Question >> Jul 05, 2024 10:41 AM Viola F wrote: Patient wants to know if it's okay for her to take the Magnesium  and the Curamin? She wants to make sure there's no drug interactions with her other medications. Please call her at [phone] (M)

## 2024-07-10 NOTE — Telephone Encounter (Signed)
 This issue has since been resolved.

## 2024-07-11 NOTE — Telephone Encounter (Signed)
Called pt and relayed MD response, pt verbalized understanding

## 2024-07-11 NOTE — Telephone Encounter (Signed)
 Ok to take the magnesium , but I am not sure about the Curamin, as this is not a FDA approved medication.   May be safer to hold off on this    thanks

## 2024-07-12 ENCOUNTER — Ambulatory Visit (INDEPENDENT_AMBULATORY_CARE_PROVIDER_SITE_OTHER)

## 2024-07-12 VITALS — Ht 59.5 in | Wt 220.0 lb

## 2024-07-12 DIAGNOSIS — Z1211 Encounter for screening for malignant neoplasm of colon: Secondary | ICD-10-CM

## 2024-07-12 DIAGNOSIS — Z0000 Encounter for general adult medical examination without abnormal findings: Secondary | ICD-10-CM | POA: Diagnosis not present

## 2024-07-12 DIAGNOSIS — Z1231 Encounter for screening mammogram for malignant neoplasm of breast: Secondary | ICD-10-CM | POA: Diagnosis not present

## 2024-07-12 NOTE — Patient Instructions (Addendum)
 [name],  Thank you for taking the time for your Medicare Wellness Visit. I appreciate your continued commitment to your health goals. Please review the care plan we discussed, and feel free to reach out if I can assist you further.  Medicare recommends these wellness visits once per year to help you and your care team stay ahead of potential health issues. These visits are designed to focus on prevention, allowing your provider to concentrate on managing your acute and chronic conditions during your regular appointments.  Please note that Annual Wellness Visits do not include a physical exam. Some assessments may be limited, especially if the visit was conducted virtually. If needed, we may recommend a separate in-person follow-up with your provider.  Ongoing Care Seeing your primary care provider every 3 to 6 months helps us  monitor your health and provide consistent, personalized care.   Referrals If a referral was made during today's visit and you haven't received any updates within two weeks, please contact the referred provider directly to check on the status.  You have an order for:  []   2D Mammogram  [x]   3D Mammogram  []   Bone Density   []   Lung Cancer Screening  Please call for appointment:   [facility] Address: [address], [place], [place] [address] Phone: [phone]  Make sure to wear two-piece clothing.  No lotions powders or deodorants the day of the appointment Make sure to bring picture ID and insurance card.  Bring list of medications you are currently taking including any supplements.   Schedule your [place] screening mammogram through MyChart!   Log into your MyChart account.  Go to 'Visit' (or 'Appointments' if on mobile App) --> Schedule an Appointment  Under 'Select a Reason for Visit' choose the Mammogram Screening option.  Complete the pre-visit questions and select the time and place that best fits your  schedule.    A referral has been placed for colonoscopy to: [address] [place], [address], [place], [place] [address] [phone] [email or web address] Open  Closes 5 PM   Recommended Screenings:  Health Maintenance  Topic Date Due   Yearly kidney health urinalysis for diabetes  Never done   Zoster (Shingles) Vaccine (1 of 2) Never done   DEXA scan (bone density measurement)  Never done   Breast Cancer Screening  11/17/2022   Complete foot exam   11/03/2023   Colon Cancer Screening  12/07/2023   Hemoglobin A1C  12/23/2024   Eye exam for diabetics  04/06/2025   Yearly kidney function blood test for diabetes  06/26/2025   Medicare Annual Wellness Visit  07/12/2025   DTaP/Tdap/Td vaccine (3 - Td or Tdap) 06/24/2030   Pneumococcal Vaccine for age over 83  Completed   Flu Shot  Completed   Hepatitis C Screening  Completed   HPV Vaccine  Aged Out   Meningitis B Vaccine  Aged Out   COVID-19 Vaccine  Discontinued       06/25/2024    5:39 AM  Advanced Directives  Does Patient Have a Medical Advance Directive? No  Would patient like information on creating a medical advance directive? No - Patient declined   Advance Care Planning is important because it: Ensures you receive medical care that aligns with your values, goals, and preferences. Provides guidance to your family and loved ones, reducing the emotional burden of decision-making during critical moments.  Vision: Annual vision screenings are recommended for early detection of glaucoma, cataracts, and  diabetic retinopathy. These exams can also reveal signs of chronic conditions such as diabetes and high blood pressure.  Dental: Annual dental screenings help detect early signs of oral cancer, gum disease, and other conditions linked to overall health, including heart disease and diabetes.  Please see the attached documents for additional preventive care recommendations.

## 2024-07-12 NOTE — Progress Notes (Signed)
 Please attest and cosign this visit due to patients primary care provider not being in the office at the time the visit was completed.    Subjective:   Bianca Campbell is a 68 y.o. who presents for a Medicare Wellness preventive visit.  As a reminder, Annual Wellness Visits don't include a physical exam, and some assessments may be limited, especially if this visit is performed virtually. We may recommend an in-person follow-up visit with your provider if needed.  Visit Complete: Virtual I connected with  Bianca Campbell on 07/12/24 by a audio enabled telemedicine application and verified that I am speaking with the correct person using two identifiers.  Patient Location: Home  Provider Location: Home Office  I discussed the limitations of evaluation and management by telemedicine. The patient expressed understanding and agreed to proceed.  Vital Signs: Because this visit was a virtual/telehealth visit, some criteria may be missing or patient reported. Any vitals not documented were not able to be obtained and vitals that have been documented are patient reported.  VideoDeclined- This patient declined Librarian, academic. Therefore the visit was completed with audio only.  Persons Participating in Visit: Patient.  AWV Questionnaire: No: Patient Medicare AWV questionnaire was not completed prior to this visit.  Cardiac Risk Factors include: advanced age (>78men, >60 women);diabetes mellitus;dyslipidemia;hypertension;obesity (BMI >30kg/m2);sedentary lifestyle     Objective:    Today's Vitals   07/12/24 1050 07/12/24 1052  Weight: 220 lb (99.8 kg)   Height: 4' 11.5 (1.511 m)   PainSc:  8    Body mass index is 43.69 kg/m.     07/12/2024   11:21 AM 07/12/2024   11:20 AM 06/25/2024    5:39 AM 08/15/2023   12:04 PM 06/20/2023    9:12 AM 12/25/2021    7:45 PM 11/20/2021    5:58 PM  Advanced Directives  Does Patient Have a Medical Advance Directive? No  No No No Yes No No  Type of Agricultural consultant;Living will    Copy of Healthcare Power of Attorney in Chart?     No - copy requested    Would patient like information on creating a medical advance directive?   No - Patient declined Yes (MAU/Ambulatory/Procedural Areas - Information given)   No - Patient declined    Current Medications (verified) Outpatient Encounter Medications as of 07/12/2024  Medication Sig   acetaminophen  (TYLENOL ) 500 MG tablet Take 1,000 mg by mouth daily as needed for mild pain (pain score 1-3), headache or moderate pain (pain score 4-6).   amLODipine  (NORVASC ) 10 MG tablet Take 1 tablet (10 mg total) by mouth daily.   Ascorbic Acid (VITAMIN C PO) Take 1 tablet by mouth daily.   aspirin  EC 81 MG tablet Take 81 mg by mouth daily. Swallow whole.Pt says told to comtinue taking ASA (as well as Plavix )   atorvastatin  (LIPITOR) 20 MG tablet Take 1 tablet (20 mg total) by mouth daily.   Barberry-Oreg Grape-Goldenseal (BERBERINE COMPLEX PO) Take 2 capsules by mouth daily.   Cholecalciferol  (VITAMIN D3) 5000 units CAPS Take 5,000 Units by mouth daily.   CHROMIUM PO Take 1 capsule by mouth daily.   CINNAMON PO Take 250 mg by mouth 1 day or 1 dose.   clopidogrel  (PLAVIX ) 75 MG tablet Take 1 tablet (75 mg total) by mouth daily.   clopidogrel  (PLAVIX ) 75 MG tablet Take 75 mg by mouth daily.   glucose blood (ONETOUCH  VERIO) test strip Use as instructed four time per day E11.9   irbesartan  (AVAPRO ) 300 MG tablet Take 1 tablet (300 mg total) by mouth daily.   Lancets MISC Use as directed four times per day E11.9   meclizine  (ANTIVERT ) 25 MG tablet Take 1 tablet (25 mg total) by mouth 3 (three) times daily as needed for dizziness.   meloxicam  (MOBIC ) 15 MG tablet TAKE 1 TABLET BY MOUTH ONCE DAILY AS NEEDED FOR PAIN   OVER THE COUNTER MEDICATION Take 2 capsules by mouth daily. Glucose advance   No facility-administered encounter medications on file as  of 07/12/2024.    Allergies (verified) Latex   History: Past Medical History:  Diagnosis Date   ALLERGIC RHINITIS 09/11/2007   Anemia, unspecified 08/31/2012   Arthritis    knees (11/23/2016)   BACK PAIN    EXTERNAL OTITIS 09/11/2007   GERD 06/06/2007   GOUT 11/14/2007   High cholesterol    HYPERTENSION 06/06/2007   INTERMITTENT VERTIGO 09/11/2007   Irritable bowel syndrome 06/06/2007   LOW BACK PAIN 06/06/2007   OSTEOARTHRITIS 06/06/2007   PONV (postoperative nausea and vomiting)    Primary localized osteoarthritis of right knee    SHOULDER PAIN, RIGHT 12/30/2008   Vertigo of central origin 06/20/2018   Vitamin D  deficiency 03/13/2013   Past Surgical History:  Procedure Laterality Date   76 HOUR PH STUDY N/A 05/27/2019   Procedure: 24 HOUR PH STUDY;  Surgeon: Albertus Gordy HERO, MD;  Location: [facility];  Service: Gastroenterology;  Laterality: N/A;   BILATERAL SALPINGOOPHORECTOMY     CESAREAN SECTION  1987, 1991   COLONOSCOPY     pt stated 2015 or 2016 unknown md   ESOPHAGEAL MANOMETRY N/A 05/27/2019   Procedure: ESOPHAGEAL MANOMETRY (EM) WITH IMPEDENCE STUDY;  Surgeon: Albertus Gordy HERO, MD;  Location: [facility];  Service: Gastroenterology;  Laterality: N/A;   EXPLORATORY LAPAROTOMY     bowel obstruction   INGUINAL HERNIA REPAIR Bilateral    numerous hernia repairs 1996 to 2011   JOINT REPLACEMENT     KNEE ARTHROSCOPY Bilateral 2013 - 11/11/2015   LAPAROSCOPIC CHOLECYSTECTOMY  1991   s/p incarcerated hernia and ovary cyst Jan 2011  2011   TOTAL KNEE ARTHROPLASTY Right 11/21/2016   Procedure: RIGHT TOTAL KNEE ARTHROPLASTY;  Surgeon: Lamar Millman, MD;  Location: [facility];  Service: Orthopedics;  Laterality: Right;   UMBILICAL HERNIA REPAIR     numerous hernia repairs 1996 to 2011   Family History  Problem Relation Age of Onset   Arthritis Mother        RA   Diabetes Mother    Seizures Mother    Colon polyps Father 30   Healthy Sister    Healthy Brother    Healthy  Son    Healthy Daughter    Asthma Daughter    Colon cancer Neg Hx    Esophageal cancer Neg Hx    Rectal cancer Neg Hx    Stomach cancer Neg Hx    Social History   Socioeconomic History   Marital status: Married    Spouse name: Dempsey   Number of children: 2   Years of education: Not on file   Highest education level: Not on file  Occupational History   Occupation: unemployed recently laid off   Occupation: Retired  Tobacco Use   Smoking status: Never   Smokeless tobacco: Never  Vaping Use   Vaping status: Never Used  Substance and Sexual Activity   Alcohol use:  No   Drug use: No   Sexual activity: Never  Other Topics Concern   Not on file  Social History Narrative   Not on file   Social Drivers of Health   Financial Resource Strain: Low Risk  (07/12/2024)   Overall Financial Resource Strain (CARDIA)    Difficulty of Paying Living Expenses: Not hard at all  Food Insecurity: No Food Insecurity (07/12/2024)   Hunger Vital Sign    Worried About Running Out of Food in the Last Year: Never true    Ran Out of Food in the Last Year: Never true  Transportation Needs: No Transportation Needs (07/12/2024)   PRAPARE - Administrator, Civil Service (Medical): No    Lack of Transportation (Non-Medical): No  Physical Activity: Inactive (07/12/2024)   Exercise Vital Sign    Days of Exercise per Week: 0 days    Minutes of Exercise per Session: 0 min  Stress: No Stress Concern Present (07/12/2024)   [employer] of Occupational Health - Occupational Stress Questionnaire    Feeling of Stress: Not at all  Social Connections: Moderately Integrated (07/12/2024)   Social Connection and Isolation Panel    Frequency of Communication with Friends and Family: Three times a week    Frequency of Social Gatherings with Friends and Family: Twice a week    Attends Religious Services: More than 4 times per year    Active Member of [employer] or Organizations: No    Attends Museum/gallery exhibitions officer: Never    Marital Status: Married    Tobacco Counseling Counseling given: Not Answered    Clinical Intake:  Pre-visit preparation completed: Yes  Pain : 0-10 Pain Score: 8  Pain Type: Chronic pain Pain Location: Foot Pain Orientation: Right, Left Pain Descriptors / Indicators: Pins and needles, Numbness (rt arm affected also says due to stroke 06/25/24) Pain Onset: 1 to 4 weeks ago Pain Frequency: Constant Pain Relieving Factors: medication, exercises Effect of Pain on Daily Activities: decreased activity  Pain Relieving Factors: medication, exercises  BMI - recorded: 43.69 Nutritional Status: BMI > 30  Obese Nutritional Risks: None Diabetes: Yes CBG done?: No Did pt. bring in CBG monitor from home?: No  Lab Results  Component Value Date   HGBA1C 6.4 (H) 06/25/2024   HGBA1C 7.2 (H) 07/17/2023   HGBA1C 7.0 (H) 11/02/2022     How often do you need to have someone help you when you read instructions, pamphlets, or other written materials from your doctor or pharmacy?: 1 - Never  Interpreter Needed?: No  Comments: lives with husband Information entered by :: B.Jakerria Kingbird,LPN   Activities of Daily Living     07/12/2024   11:20 AM 06/25/2024    3:43 PM  In your present state of health, do you have any difficulty performing the following activities:  Hearing? 1   Vision? 0   Difficulty concentrating or making decisions? 0   Walking or climbing stairs? 1   Dressing or bathing? 0   Doing errands, shopping? 1 0  Preparing Food and eating ? N   Using the Toilet? N   Managing your Medications? N   Managing your Finances? N   Housekeeping or managing your Housekeeping? Y   Comment husband helps     Patient Care Team: Norleen Lynwood ORN, MD as PCP - General (Internal Medicine) [facility] (Ophthalmology)  I have updated your Care Teams any recent Medical Services you may have received from other providers in  the past year.     Assessment:    This is a routine wellness examination for [place].  Hearing/Vision screen Hearing Screening - Comments:: Patient denies any hearing difficulties with hearing aids;sometimes cannot hear/comprehend conversations Vision Screening - Comments:: Pt says their vision is good with glasses [facility]   Goals Addressed               This Visit's Progress     COMPLETED: Patient Stated (pt-stated)   On track     She would like to lose more weight.  Would like to have a Nutrition guide.         Patient Stated        I would like to walk better and get strengthened       Depression Screen     07/12/2024   11:13 AM 08/15/2023   12:03 PM 07/17/2023    1:16 PM 06/20/2023    9:19 AM 11/02/2022    2:55 PM 11/02/2022    2:40 PM 07/14/2022    2:14 PM  PHQ 2/9 Scores  PHQ - 2 Score 0 0 0 0 0 0 1  PHQ- 9 Score   0 1       Fall Risk     07/12/2024   11:04 AM 08/15/2023   12:03 PM 07/17/2023    1:16 PM 06/20/2023    9:15 AM 11/02/2022    2:55 PM  Fall Risk   Falls in the past year? 0 0 0 0 0  Number falls in past yr: 0  0 0 0  Injury with Fall? 0  0 0   Risk for fall due to : Impaired mobility;Impaired balance/gait;History of fall(s)  No Fall Risks No Fall Risks   Follow up Education provided;Falls prevention discussed  Falls evaluation completed Falls evaluation completed;Falls prevention discussed     MEDICARE RISK AT HOME:  Medicare Risk at Home Any stairs in or around the home?: Yes (2 stairs into the house) If so, are there any without handrails?: Yes Home free of loose throw rugs in walkways, pet beds, electrical cords, etc?: Yes Adequate lighting in your home to reduce risk of falls?: Yes Life alert?: No Use of a cane, walker or w/c?: Yes Grab bars in the bathroom?: Yes Shower chair or bench in shower?: Yes Elevated toilet seat or a handicapped toilet?: Yes  TIMED UP AND GO:  Was the test performed?  No  Cognitive Function: 6CIT completed        07/12/2024    11:23 AM 06/20/2023    9:16 AM  6CIT Screen  What Year? 0 points 0 points  What month? 0 points 0 points  What time? 0 points 0 points  Count back from 20 0 points 0 points  Months in reverse 0 points 0 points  Repeat phrase 2 points 0 points  Total Score 2 points 0 points    Immunizations Immunization History  Administered Date(s) Administered   INFLUENZA, HIGH DOSE SEASONAL PF 06/26/2024   Influenza,inj,Quad PF,6+ Mos 09/15/2015, 08/27/2018   PFIZER(Purple Top)SARS-COV-2 Vaccination 12/24/2019, 01/14/2020   PNEUMOCOCCAL CONJUGATE-20 11/02/2022   Td 02/24/2010   Tdap 06/24/2020    Screening Tests Health Maintenance  Topic Date Due   Diabetic kidney evaluation - Urine ACR  Never done   Zoster Vaccines- Shingrix (1 of 2) Never done   DEXA SCAN  Never done   Mammogram  11/17/2022   FOOT EXAM  11/03/2023   Colonoscopy  12/07/2023   HEMOGLOBIN A1C  12/23/2024   OPHTHALMOLOGY EXAM  04/06/2025   Diabetic kidney evaluation - eGFR measurement  06/26/2025   Medicare Annual Wellness (AWV)  07/12/2025   DTaP/Tdap/Td (3 - Td or Tdap) 06/24/2030   Pneumococcal Vaccine: 50+ Years  Completed   Influenza Vaccine  Completed   Hepatitis C Screening  Completed   HPV VACCINES  Aged Out   Meningococcal B Vaccine  Aged Out   COVID-19 Vaccine  Discontinued    Health Maintenance Items Addressed: Order for MMG and Colonoscopy referral placed Pt will receive vaccines at their pharmcy when decided to obtain   Additional Screening:  Vision Screening: Recommended annual ophthalmology exams for early detection of glaucoma and other disorders of the eye. Is the patient up to date with their annual eye exam?  Yes  Who is the provider or what is the name of the office in which the patient attends annual eye exams? Dr Claudene  Dental Screening: Recommended annual dental exams for proper oral hygiene  Community Resource Referral / Chronic Care Management: CRR required this visit?  No   CCM  required this visit?  No   Plan:    I have personally reviewed and noted the following in the patient's chart:   Medical and social history Use of alcohol, tobacco or illicit drugs  Current medications and supplements including opioid prescriptions. Patient is not currently taking opioid prescriptions. Functional ability and status Nutritional status Physical activity Advanced directives List of other physicians Hospitalizations, surgeries, and ER visits in previous 12 months Vitals Screenings to include cognitive, depression, and falls Referrals and appointments  In addition, I have reviewed and discussed with patient certain preventive protocols, quality metrics, and best practice recommendations. A written personalized care plan for preventive services as well as general preventive health recommendations were provided to patient.   Erminio LITTIE Saris, LPN   [id number]   After Visit Summary: (MyChart) Due to this being a telephonic visit, the after visit summary with patients personalized plan was offered to patient via MyChart   Notes: Please refer to Routing Comments.    Notes to PCP: Pt AWV done today! Pt relays she had a stroke on 06/25/24 and has rt sided weakness, uses rollater and in PT right now. Pt is scheduled to see you on Monday 07/15/24. Thank you! Erminio

## 2024-07-15 ENCOUNTER — Ambulatory Visit (INDEPENDENT_AMBULATORY_CARE_PROVIDER_SITE_OTHER): Admitting: Internal Medicine

## 2024-07-15 ENCOUNTER — Encounter: Payer: Self-pay | Admitting: Internal Medicine

## 2024-07-15 VITALS — BP 126/80 | HR 59 | Temp 98.1°F | Ht 59.5 in | Wt 220.0 lb

## 2024-07-15 DIAGNOSIS — Z0000 Encounter for general adult medical examination without abnormal findings: Secondary | ICD-10-CM

## 2024-07-15 DIAGNOSIS — I10 Essential (primary) hypertension: Secondary | ICD-10-CM

## 2024-07-15 DIAGNOSIS — E1165 Type 2 diabetes mellitus with hyperglycemia: Secondary | ICD-10-CM | POA: Diagnosis not present

## 2024-07-15 DIAGNOSIS — Z8673 Personal history of transient ischemic attack (TIA), and cerebral infarction without residual deficits: Secondary | ICD-10-CM

## 2024-07-15 DIAGNOSIS — M5431 Sciatica, right side: Secondary | ICD-10-CM

## 2024-07-15 DIAGNOSIS — Z0001 Encounter for general adult medical examination with abnormal findings: Secondary | ICD-10-CM

## 2024-07-15 DIAGNOSIS — E538 Deficiency of other specified B group vitamins: Secondary | ICD-10-CM

## 2024-07-15 DIAGNOSIS — E559 Vitamin D deficiency, unspecified: Secondary | ICD-10-CM | POA: Diagnosis not present

## 2024-07-15 LAB — LIPID PANEL
Cholesterol: 154 mg/dL (ref 0–200)
HDL: 70.5 mg/dL (ref >39.00)
LDL Cholesterol: 74 mg/dL (ref 0–99)
NonHDL: 83.77
Total CHOL/HDL Ratio: 2
Triglycerides: 48 mg/dL (ref 0.0–149.0)
VLDL: 9.6 mg/dL (ref 0.0–40.0)

## 2024-07-15 LAB — URINALYSIS, ROUTINE W REFLEX MICROSCOPIC
Bilirubin Urine: NEGATIVE
Hgb urine dipstick: NEGATIVE
Ketones, ur: NEGATIVE
Leukocytes,Ua: NEGATIVE
Nitrite: NEGATIVE
RBC / HPF: NONE SEEN (ref 0–?)
Specific Gravity, Urine: 1.01 (ref 1.000–1.030)
Total Protein, Urine: NEGATIVE
Urine Glucose: NEGATIVE
Urobilinogen, UA: 0.2 (ref 0.0–1.0)
WBC, UA: NONE SEEN (ref 0–?)
pH: 6 (ref 5.0–8.0)

## 2024-07-15 LAB — CBC WITH DIFFERENTIAL/PLATELET
Basophils Absolute: 0 K/uL (ref 0.0–0.1)
Basophils Relative: 0.6 % (ref 0.0–3.0)
Eosinophils Absolute: 0.2 K/uL (ref 0.0–0.7)
Eosinophils Relative: 3.9 % (ref 0.0–5.0)
HCT: 38.8 % (ref 36.0–46.0)
Hemoglobin: 12.5 g/dL (ref 12.0–15.0)
Lymphocytes Relative: 39.6 % (ref 12.0–46.0)
Lymphs Abs: 1.8 K/uL (ref 0.7–4.0)
MCHC: 32.3 g/dL (ref 30.0–36.0)
MCV: 81.7 fl (ref 78.0–100.0)
Monocytes Absolute: 0.3 K/uL (ref 0.1–1.0)
Monocytes Relative: 7.6 % (ref 3.0–12.0)
Neutro Abs: 2.1 K/uL (ref 1.4–7.7)
Neutrophils Relative %: 48.3 % (ref 43.0–77.0)
Platelets: 307 K/uL (ref 150.0–400.0)
RBC: 4.75 Mil/uL (ref 3.87–5.11)
RDW: 13.8 % (ref 11.5–15.5)
WBC: 4.4 K/uL (ref 4.0–10.5)

## 2024-07-15 LAB — HEPATIC FUNCTION PANEL
ALT: 14 U/L (ref 0–35)
AST: 17 U/L (ref 0–37)
Albumin: 4.5 g/dL (ref 3.5–5.2)
Alkaline Phosphatase: 102 U/L (ref 39–117)
Bilirubin, Direct: 0.2 mg/dL (ref 0.0–0.3)
Total Bilirubin: 0.9 mg/dL (ref 0.2–1.2)
Total Protein: 8.7 g/dL — ABNORMAL HIGH (ref 6.0–8.3)

## 2024-07-15 LAB — BASIC METABOLIC PANEL WITH GFR
BUN: 11 mg/dL (ref 6–23)
CO2: 29 meq/L (ref 19–32)
Calcium: 10.3 mg/dL (ref 8.4–10.5)
Chloride: 101 meq/L (ref 96–112)
Creatinine, Ser: 0.56 mg/dL (ref 0.40–1.20)
GFR: 94.52 mL/min (ref >60.00)
Glucose, Bld: 103 mg/dL — ABNORMAL HIGH (ref 70–99)
Potassium: 4.2 meq/L (ref 3.5–5.1)
Sodium: 137 meq/L (ref 135–145)

## 2024-07-15 LAB — HEMOGLOBIN A1C: Hgb A1c MFr Bld: 7.2 % — ABNORMAL HIGH (ref 4.6–6.5)

## 2024-07-15 LAB — MICROALBUMIN / CREATININE URINE RATIO
Creatinine,U: 65.1 mg/dL
Microalb Creat Ratio: 13 mg/g (ref 0.0–30.0)
Microalb, Ur: 0.8 mg/dL (ref 0.0–1.9)

## 2024-07-15 MED ORDER — GABAPENTIN 100 MG PO CAPS: 100.0000 mg | ORAL_CAPSULE | Freq: Three times a day (TID) | ORAL | 5 refills | Status: DC

## 2024-07-15 NOTE — Progress Notes (Signed)
 Patient ID: Bianca Campbell, female   DOB: 1956-01-10, 68 y.o.   MRN: [id number]         Chief Complaint:: wellness exam and Annual Exam (Wants to know what she can take for the nerve pain says tynelol does not work, wants the referral for colonoscopy )  , hx of stroke, dm, hld, right sciatica       HPI:  Bianca Campbell is a 68 y.o. female here for wellness examl has mammogram for nov 7' also with DXA - due next year, now for colonoscopy, and will consider shingrix at pharmacy.  O/w up to date                        Also Pt denies chest pain, increased sob or doe, wheezing, orthopnea, PND, increased LE swelling, palpitations, dizziness or syncope.   Pt denies polydipsia, polyuria, or new focal neuro s/s, except 2 wks worsening frequency severity of right sciatica.  Does not have neuro f/u appt scheduled  Tolerating lipitor 20 mg qd  Wt Readings from Last 3 Encounters:  07/15/24 220 lb (99.8 kg)  07/12/24 220 lb (99.8 kg)  07/02/24 220 lb 9.6 oz (100.1 kg)   BP Readings from Last 3 Encounters:  07/15/24 126/80  07/02/24 118/80  06/26/24 131/74   Immunization History  Administered Date(s) Administered   INFLUENZA, HIGH DOSE SEASONAL PF 06/26/2024   Influenza,inj,Quad PF,6+ Mos 09/15/2015, 08/27/2018   PFIZER(Purple Top)SARS-COV-2 Vaccination 12/24/2019, 01/14/2020   PNEUMOCOCCAL CONJUGATE-20 11/02/2022   Td 02/24/2010   Tdap 06/24/2020   Health Maintenance Due  Topic Date Due   Diabetic kidney evaluation - Urine ACR  Never done   Zoster Vaccines- Shingrix (1 of 2) Never done   DEXA SCAN  Never done   Mammogram  11/17/2022   Colonoscopy  12/07/2023      Past Medical History:  Diagnosis Date   ALLERGIC RHINITIS 09/11/2007   Anemia, unspecified 08/31/2012   Arthritis    knees (11/23/2016)   BACK PAIN    EXTERNAL OTITIS 09/11/2007   GERD 06/06/2007   GOUT 11/14/2007   High cholesterol    HYPERTENSION 06/06/2007   INTERMITTENT VERTIGO 09/11/2007   Irritable bowel  syndrome 06/06/2007   LOW BACK PAIN 06/06/2007   OSTEOARTHRITIS 06/06/2007   PONV (postoperative nausea and vomiting)    Primary localized osteoarthritis of right knee    SHOULDER PAIN, RIGHT 12/30/2008   Vertigo of central origin 06/20/2018   Vitamin D  deficiency 03/13/2013   Past Surgical History:  Procedure Laterality Date   20 HOUR PH STUDY N/A 05/27/2019   Procedure: 24 HOUR PH STUDY;  Surgeon: Albertus Gordy HERO, MD;  Location: [facility];  Service: Gastroenterology;  Laterality: N/A;   BILATERAL SALPINGOOPHORECTOMY     CESAREAN SECTION  1987, 1991   COLONOSCOPY     pt stated 2015 or 2016 unknown md   ESOPHAGEAL MANOMETRY N/A 05/27/2019   Procedure: ESOPHAGEAL MANOMETRY (EM) WITH IMPEDENCE STUDY;  Surgeon: Albertus Gordy HERO, MD;  Location: [facility];  Service: Gastroenterology;  Laterality: N/A;   EXPLORATORY LAPAROTOMY     bowel obstruction   INGUINAL HERNIA REPAIR Bilateral    numerous hernia repairs 1996 to 2011   JOINT REPLACEMENT     KNEE ARTHROSCOPY Bilateral 2013 - 11/11/2015   LAPAROSCOPIC CHOLECYSTECTOMY  1991   s/p incarcerated hernia and ovary cyst Jan 2011  2011   TOTAL KNEE ARTHROPLASTY Right 11/21/2016   Procedure: RIGHT TOTAL KNEE  ARTHROPLASTY;  Surgeon: Lamar Millman, MD;  Location: [facility];  Service: Orthopedics;  Laterality: Right;   UMBILICAL HERNIA REPAIR     numerous hernia repairs 1996 to 2011    reports that she has never smoked. She has never used smokeless tobacco. She reports that she does not drink alcohol and does not use drugs. family history includes Arthritis in her mother; Asthma in her daughter; Colon polyps (age of onset: 56) in her father; Diabetes in her mother; Healthy in her brother, daughter, sister, and son; Seizures in her mother. Allergies  Allergen Reactions   Latex Itching   Current Outpatient Medications on File Prior to Visit  Medication Sig Dispense Refill   acetaminophen  (TYLENOL ) 500 MG tablet Take 1,000 mg by mouth daily as needed  for mild pain (pain score 1-3), headache or moderate pain (pain score 4-6).     amLODipine  (NORVASC ) 10 MG tablet Take 1 tablet (10 mg total) by mouth daily. 90 tablet 3   Ascorbic Acid (VITAMIN C PO) Take 1 tablet by mouth daily.     aspirin  EC 81 MG tablet Take 81 mg by mouth daily. Swallow whole.Pt says told to comtinue taking ASA (as well as Plavix )     atorvastatin  (LIPITOR) 20 MG tablet Take 1 tablet (20 mg total) by mouth daily. 90 tablet 3   Barberry-Oreg Grape-Goldenseal (BERBERINE COMPLEX PO) Take 2 capsules by mouth daily.     Cholecalciferol  (VITAMIN D3) 5000 units CAPS Take 5,000 Units by mouth daily.     CHROMIUM PO Take 1 capsule by mouth daily.     CINNAMON PO Take 250 mg by mouth 1 day or 1 dose.     clopidogrel  (PLAVIX ) 75 MG tablet Take 1 tablet (75 mg total) by mouth daily. 90 tablet 0   clopidogrel  (PLAVIX ) 75 MG tablet Take 75 mg by mouth daily.     glucose blood (ONETOUCH VERIO) test strip Use as instructed four time per day E11.9 400 each 12   irbesartan  (AVAPRO ) 300 MG tablet Take 1 tablet (300 mg total) by mouth daily. 90 tablet 0   Lancets MISC Use as directed four times per day E11.9 400 each 11   meclizine  (ANTIVERT ) 25 MG tablet Take 1 tablet (25 mg total) by mouth 3 (three) times daily as needed for dizziness. 30 tablet 5   meloxicam  (MOBIC ) 15 MG tablet TAKE 1 TABLET BY MOUTH ONCE DAILY AS NEEDED FOR PAIN 90 tablet 3   OVER THE COUNTER MEDICATION Take 2 capsules by mouth daily. Glucose advance     No current facility-administered medications on file prior to visit.        ROS:  All others reviewed and negative.  Objective        PE:  BP 126/80 (BP Location: Left Arm, Patient Position: Sitting, Cuff Size: Normal)   Pulse (!) 59   Temp 98.1 F (36.7 C) (Oral)   Ht 4' 11.5 (1.511 m)   Wt 220 lb (99.8 kg)   SpO2 99%   BMI 43.69 kg/m                 Constitutional: Pt appears in NAD               HENT: Head: NCAT.                Right Ear: External ear  normal.                 Left Ear: External  ear normal.                Eyes: . Pupils are equal, round, and reactive to light. Conjunctivae and EOM are normal               Nose: without d/c or deformity               Neck: Neck supple. Gross normal ROM               Cardiovascular: Normal rate and regular rhythm.                 Pulmonary/Chest: Effort normal and breath sounds without rales or wheezing.                Abd:  Soft, NT, ND, + BS, no organomegaly               Neurological: Pt is alert. At baseline orientation, motor grossly intact               Skin: Skin is warm. No rashes, no other new lesions, LE edema - none               Psychiatric: Pt behavior is normal without agitation   Micro: none  Cardiac tracings I have personally interpreted today:  none  Pertinent Radiological findings (summarize): none   Lab Results  Component Value Date   WBC 4.3 06/26/2024   HGB 12.1 06/26/2024   HCT 39.0 06/26/2024   PLT 318 06/26/2024   GLUCOSE 132 (H) 06/26/2024   CHOL 170 06/25/2024   TRIG 66 06/25/2024   HDL 72 06/25/2024   LDLDIRECT 114.4 03/13/2013   LDLCALC 85 06/25/2024   ALT 12 06/26/2024   AST 17 06/26/2024   NA 137 06/26/2024   K 3.8 06/26/2024   CL 102 06/26/2024   CREATININE 0.62 06/26/2024   BUN 14 06/26/2024   CO2 21 (L) 06/26/2024   TSH 1.82 11/02/2022   INR 1.0 06/25/2024   HGBA1C 6.4 (H) 06/25/2024   Assessment/Plan:  CHARNICE ZWILLING is a 68 y.o. Black or African American [2] female with  has a past medical history of ALLERGIC RHINITIS (09/11/2007), Anemia, unspecified (08/31/2012), Arthritis, BACK PAIN, EXTERNAL OTITIS (09/11/2007), GERD (06/06/2007), GOUT (11/14/2007), High cholesterol, HYPERTENSION (06/06/2007), INTERMITTENT VERTIGO (09/11/2007), Irritable bowel syndrome (06/06/2007), LOW BACK PAIN (06/06/2007), OSTEOARTHRITIS (06/06/2007), PONV (postoperative nausea and vomiting), Primary localized osteoarthritis of right knee, SHOULDER PAIN, RIGHT  (12/30/2008), Vertigo of central origin (06/20/2018), and Vitamin D  deficiency (03/13/2013).  History of stroke Stable, for contd plavix  and lipitor,  to f/u any worsening symptoms or concerns, also refer neurology, continue PT  Encounter for well adult exam with abnormal findings Age and sex appropriate education and counseling updated with regular exercise and diet Referrals for preventative services - for colonoscopy, has mammogram scheduled soon with DXA every 2-3 yrs per gyn Immunizations addressed - for shingrix at pharmacy Smoking counseling  - none needed Evidence for depression or other mood disorder - none significant Most recent labs reviewed. I have personally reviewed and have noted: 1) the patient's medical and social history 2) The patient's current medications and supplements 3) The patient's height, weight, and BMI have been recorded in the chart   Vitamin D  deficiency Last vitamin D  Lab Results  Component Value Date   VD25OH 30.02 11/02/2022   Low, to start oral replacement   Essential hypertension BP Readings from Last 3 Encounters:  07/15/24 126/80  07/02/24 118/80  06/26/24 131/74   Stable, pt to continue medical treatment norvasc  10 every day, avapro  300 qd   Diabetes (HCC) Lab Results  Component Value Date   HGBA1C 6.4 (H) 06/25/2024   Stable, pt to continue current medical treatment  - diet, wt control   Sciatica of right side With recent flare pain, for gabapentin 100 tid  Followup: Return in about 6 months (around 01/12/2025).  Lynwood Rush, MD 07/15/2024 8:59 PM [facility]

## 2024-07-15 NOTE — Assessment & Plan Note (Signed)
Last vitamin D Lab Results  Component Value Date   VD25OH 30.02 11/02/2022   Low, to start oral replacement

## 2024-07-15 NOTE — Patient Instructions (Addendum)
 Please take all new medication as prescribed - the gabapentin for the right sciatica starting at 100 mg - and we can increase this in 2-3 wks if you need more  Please continue all other medications as before, including the lipitor 20 mg   Please have the pharmacy call with any other refills you may need.  Please continue your efforts at being more active, low cholesterol diet, and weight control.  You are otherwise up to date with prevention measures today.  Please keep your appointments with your specialists as you may have planned - the colonoscopy, and PT  You will be contacted regarding the referral for: Dr Rosemarie - stroke clinic  Please go to the LAB at the blood drawing area for the tests to be done  You will be contacted by phone if any changes need to be made immediately.  Otherwise, you will receive a letter about your results with an explanation, but please check with MyChart first.  Please make an Appointment to return in 6 months, or sooner if needed

## 2024-07-15 NOTE — Assessment & Plan Note (Signed)
 With recent flare pain, for gabapentin 100 tid

## 2024-07-15 NOTE — Assessment & Plan Note (Addendum)
 Age and sex appropriate education and counseling updated with regular exercise and diet Referrals for preventative services - for colonoscopy, has mammogram scheduled soon with DXA every 2-3 yrs per gyn Immunizations addressed - for shingrix at pharmacy Smoking counseling  - none needed Evidence for depression or other mood disorder - none significant Most recent labs reviewed. I have personally reviewed and have noted: 1) the patient's medical and social history 2) The patient's current medications and supplements 3) The patient's height, weight, and BMI have been recorded in the chart

## 2024-07-15 NOTE — Assessment & Plan Note (Addendum)
 Stable, for contd plavix  and lipitor,  to f/u any worsening symptoms or concerns, also refer neurology, continue PT

## 2024-07-15 NOTE — Assessment & Plan Note (Signed)
 BP Readings from Last 3 Encounters:  07/15/24 126/80  07/02/24 118/80  06/26/24 131/74   Stable, pt to continue medical treatment norvasc  10 every day, avapro  300 qd

## 2024-07-15 NOTE — Assessment & Plan Note (Addendum)
 Lab Results  Component Value Date   HGBA1C 6.4 (H) 06/25/2024  With hyperglycemia Stable, pt to continue current medical treatment  - diet, wt control

## 2024-07-16 ENCOUNTER — Ambulatory Visit: Payer: Self-pay | Admitting: Internal Medicine

## 2024-07-16 ENCOUNTER — Other Ambulatory Visit: Payer: Self-pay | Admitting: Internal Medicine

## 2024-07-16 LAB — TSH: TSH: 1.94 u[IU]/mL (ref 0.35–5.50)

## 2024-07-16 LAB — VITAMIN B12: Vitamin B-12: 1079 pg/mL — ABNORMAL HIGH (ref 211–911)

## 2024-07-16 LAB — VITAMIN D 25 HYDROXY (VIT D DEFICIENCY, FRACTURES): VITD: 30.09 ng/mL (ref 30.00–100.00)

## 2024-07-16 MED ORDER — METFORMIN HCL ER 500 MG PO TB24: 500.0000 mg | ORAL_TABLET | Freq: Every day | ORAL | 3 refills | Status: DC

## 2024-07-18 ENCOUNTER — Ambulatory Visit: Payer: Self-pay

## 2024-07-18 NOTE — Telephone Encounter (Signed)
 FYI Only or Action Required?: FYI only for provider.  Patient was last seen in primary care on 07/15/2024 by Norleen Lynwood ORN, MD.  Called Nurse Triage reporting Pain.  Symptoms began today.  Interventions attempted: meloxicam  and gabapentin .  Symptoms are: unchanged.  Triage Disposition: Home Care  Patient/caregiver understands and will follow disposition?: Yes      Copied from CRM [id number]. Topic: Clinical - Red Word Triage >> Jul 18, 2024 11:50 AM Adelita BRAVO wrote: [facility] that prompted transfer to Nurse Triage: Mallie, PT assistant with [facility], called in to report that patient rated pain level 8-9 out of 10 near her sciatic nerve. Reason for Disposition  Caused by overuse from recent vigorous activity (e.g., aerobics, jogging/running, physical work, prolonged walking, sports)  Answer Assessment - Initial Assessment Questions This RN spoke with Mallie a PT assistant with [facility] who called to report pain for the patient to her PCP.  Took meloxicam  and gabapentin for symptoms. Caller not currently with patient and did not report any other symptoms.      1. ONSET: When did the pain start?      Today  2. LOCATION: Where is the pain located?      Sciatic pain down leg and in buttocks area  3. PAIN: How bad is the pain?    (Scale 1-10; or mild, moderate, severe)     8 or 9 out of 10  4. WORK OR EXERCISE: Has there been any recent work or exercise that involved this part of the body?      Patient currently doing PT.  5. OTHER SYMPTOMS: Do you have any other symptoms? (e.g., chest pain, back pain, breathing difficulty, swelling, rash, fever, numbness, weakness)     Denies  Protocols used: Leg Pain-A-AH

## 2024-07-18 NOTE — Addendum Note (Signed)
 Addended by: ESTELLA ERMINIO CROME on: 07/18/2024 04:26 PM   Modules accepted: Orders

## 2024-07-19 ENCOUNTER — Other Ambulatory Visit: Payer: Self-pay | Admitting: Internal Medicine

## 2024-07-19 MED ORDER — TRAMADOL HCL 50 MG PO TABS: 50.0000 mg | ORAL_TABLET | Freq: Four times a day (QID) | ORAL | 0 refills | Status: DC | PRN

## 2024-07-19 NOTE — Telephone Encounter (Signed)
 Ok I will send small rx for tramadol   Please ask pt to call if helps and she has further need

## 2024-08-05 ENCOUNTER — Ambulatory Visit: Attending: Cardiology

## 2024-08-05 DIAGNOSIS — R002 Palpitations: Secondary | ICD-10-CM

## 2024-08-05 DIAGNOSIS — I639 Cerebral infarction, unspecified: Secondary | ICD-10-CM

## 2024-08-05 DIAGNOSIS — R42 Dizziness and giddiness: Secondary | ICD-10-CM

## 2024-08-05 NOTE — Progress Notes (Signed)
 OFFICE NOTE:    Date:  08/06/2024  ID:  Bianca Campbell, DOB [birth year], MRN [id number] PCP: Norleen Lynwood ORN, MD  [facility] Providers Cardiologist:  None       Hx of CVA 06/2024 TTE 06/25/24: EF 60-65, no RWMA, normal RVSF, normal PASP, RVSP 19.8, mild-moderate LAE Coronary artery Ca2+ MPI 07/04/2017: Low risk, EF 64 CAC score 08/07/2023: 10 (33rd percentile) Carotid stenosis Carotid US  11/12/2018: Bilateral ICA 1-39 Hypertension  Hyperlipidemia  Diabetes mellitus type 2        Discussed the use of AI scribe software for clinical note transcription with the patient, who gave verbal consent to proceed. History of Present Illness Bianca Campbell is a 68 y.o. female who is referred by Norleen Lynwood ORN, MD for follow up post stroke.  The patient was admitted 9/9-9/10 with left thalamic lacunar infarct.  She presented with right-sided numbness.  Neurology recommended DAPT x 3 months followed by aspirin  therapy alone.  She was set up for event monitor to rule out atrial fibrillation.  This demonstrated no evidence of atrial fibrillation.  She continues to experience right-sided weakness following her recent stroke, necessitating the use of a walker. Persistent tingling in her right hand and foot complicates wearing shoes or socks. She is currently undergoing physical therapy, which is nearing completion.   She has experienced a 'heavy feeling' in the center of her chest for over a year. The discomfort has not worsened but has become more frequent. It appears to be related to meals and is alleviated by antacids such as Tums and DGL tablets. No exertional chest pain, shortness of breath, or radiation of pain to the neck or arms. She is attempting to identify if specific foods trigger the discomfort.  Family history reveals her mother had a stent placed and had diabetes, while her father had a stroke. Both parents have recently passed away. There is no family history of heart attacks or  heart failure.  She is retired from [employer], industrial/product work and does not smoke, drink alcohol, or use drugs. She has been under significant stress due to the recent passing of her parents and brother, and her husband's knee replacement surgery.     ROS-See HPI    Studies Reviewed:      Labs 07/15/2024: K 4.2, creatinine 0.56, eGFR 94.52, ALT 14, total cholesterol 154, HDL 70.5, LDL 74, triglycerides 48, Hgb 12.5, PLT 307K, hemoglobin A1c 7.2, TSH 1.94        Physical Exam:  VS:  BP 127/80 (BP Location: Left Arm, Patient Position: Sitting)   Pulse 93   Ht 4' 11 (1.499 m)   Wt 224 lb (101.6 kg)   SpO2 99%   BMI 45.24 kg/m        Wt Readings from Last 3 Encounters:  08/06/24 224 lb (101.6 kg)  07/15/24 220 lb (99.8 kg)  07/12/24 220 lb (99.8 kg)    Constitutional:      Appearance: Healthy appearance. Not in distress.  Neck:     Vascular: No carotid bruit.  Pulmonary:     Breath sounds: Normal breath sounds. No wheezing. No rales.  Cardiovascular:     Normal rate. Regular rhythm.     Murmurs: There is no murmur.  Edema:    Peripheral edema absent.  Abdominal:     Palpations: Abdomen is soft.       Assessment and Plan:    Assessment & Plan History of stroke Recent stroke with residual  right-sided weakness and sensory symptoms, including tingling in the hand and foot. Imaging was consistent with a small vessel disease. Her monitor demonstrated no atrial fibrillation. Official read is still pending. Echocardiogram showed normal EF and no cardiac source of embolus. CT scan of head and neck showed no significant carotid artery disease. She has follow up with neurology November 24th. Coronary artery calcification Precordial chest pain Intermittent chest pain, likely related to gastroesophageal reflux disease (GERD). Symptoms are meal-related and relieved by antacids. No exertional component or associated shortness of breath. Symptoms have been present for over a year, with  increased frequency but no worsening. Differential includes cardiac causes, but current presentation is more consistent with GERD. She does have several risk factors for ischemic heart disease.  - Prescribe Protonix  40 mg once daily for 6 weeks, then as needed. - Schedule follow-up in 3 months to reassess chest pain. - If symptoms resolve with Protonix , follow up with primary care physician and cancel cardiology follow-up. - If symptoms persist, will consider cardiac CTA to evaluate for obstructive coronary artery disease. - Continue ASA 81 mg once daily, Atorvastatin  20 mg once daily  Essential hypertension BP is controlled on Amlodipine  10 mg once daily, Irbesartan  300 mg once daily  Pure hypercholesterolemia Managed by PCP. LDL goal < 70 with hx of stroke. She notes Atorvastatin  is new. Recent LDL close to goal.         Dispo:  Return in about 3 months (around 11/06/2024) for Routine follow up in 3 months with [facility]  Signed, Glendia Ferrier, PA-C

## 2024-08-06 ENCOUNTER — Encounter: Payer: Self-pay | Admitting: Physician Assistant

## 2024-08-06 ENCOUNTER — Ambulatory Visit: Attending: Physician Assistant | Admitting: Physician Assistant

## 2024-08-06 VITALS — BP 127/80 | HR 93 | Ht 59.0 in | Wt 224.0 lb

## 2024-08-06 DIAGNOSIS — E7800 Pure hypercholesterolemia, unspecified: Secondary | ICD-10-CM

## 2024-08-06 DIAGNOSIS — I10 Essential (primary) hypertension: Secondary | ICD-10-CM

## 2024-08-06 DIAGNOSIS — I2510 Atherosclerotic heart disease of native coronary artery without angina pectoris: Secondary | ICD-10-CM

## 2024-08-06 DIAGNOSIS — R072 Precordial pain: Secondary | ICD-10-CM | POA: Diagnosis not present

## 2024-08-06 DIAGNOSIS — Z8673 Personal history of transient ischemic attack (TIA), and cerebral infarction without residual deficits: Secondary | ICD-10-CM

## 2024-08-06 MED ORDER — PANTOPRAZOLE SODIUM 40 MG PO TBEC: 40.0000 mg | DELAYED_RELEASE_TABLET | Freq: Every day | ORAL | 2 refills | Status: DC

## 2024-08-06 NOTE — Patient Instructions (Signed)
 Medication Instructions:   START Protonix  one (1) tablet by mouth ( 40 mg) daily for 6 weeks, if your chest pain subsides you can take as needed.   *If you need a refill on your cardiac medications before your next appointment, please call your pharmacy*  Lab Work:  None ordered.  If you have labs (blood work) drawn today and your tests are completely normal, you will receive your results only by: MyChart Message (if you have MyChart) OR A paper copy in the mail If you have any lab test that is abnormal or we need to change your treatment, we will call you to review the results.  Testing/Procedures:  None ordered.   Follow-Up: At [facility], you and your health needs are our priority.  As part of our continuing mission to provide you with exceptional heart care, our providers are all part of one team.  This team includes your primary Cardiologist (physician) and Advanced Practice Providers or APPs (Physician Assistants and Nurse Practitioners) who all work together to provide you with the care you need, when you need it.  Your next appointment:   3 month(s)  Provider:   Glendia Ferrier, PA-C          We recommend signing up for the patient portal called MyChart.  Sign up information is provided on this After Visit Summary.  MyChart is used to connect with patients for Virtual Visits (Telemedicine).  Patients are able to view lab/test results, encounter notes, upcoming appointments, etc.  Non-urgent messages can be sent to your provider as well.   To learn more about what you can do with MyChart, go to [email or web address].   Other Instructions  If your Chest Pain subsides in 3 months you can cancel your follow up appointment with [facility] and follow up with PCP.

## 2024-08-06 NOTE — Assessment & Plan Note (Signed)
 Managed by PCP. LDL goal < 70 with hx of stroke. She notes Atorvastatin  is new. Recent LDL close to goal.

## 2024-08-06 NOTE — Assessment & Plan Note (Signed)
 Recent stroke with residual right-sided weakness and sensory symptoms, including tingling in the hand and foot. Imaging was consistent with a small vessel disease. Her monitor demonstrated no atrial fibrillation. Official read is still pending. Echocardiogram showed normal EF and no cardiac source of embolus. CT scan of head and neck showed no significant carotid artery disease. She has follow up with neurology November 24th.

## 2024-08-06 NOTE — Assessment & Plan Note (Signed)
 BP is controlled on Amlodipine  10 mg once daily, Irbesartan  300 mg once daily

## 2024-08-13 DIAGNOSIS — M1712 Unilateral primary osteoarthritis, left knee: Secondary | ICD-10-CM | POA: Insufficient documentation

## 2024-09-05 ENCOUNTER — Ambulatory Visit (HOSPITAL_BASED_OUTPATIENT_CLINIC_OR_DEPARTMENT_OTHER): Payer: Self-pay | Admitting: Cardiology

## 2024-09-05 DIAGNOSIS — R42 Dizziness and giddiness: Secondary | ICD-10-CM

## 2024-09-05 DIAGNOSIS — I639 Cerebral infarction, unspecified: Secondary | ICD-10-CM

## 2024-09-05 DIAGNOSIS — R002 Palpitations: Secondary | ICD-10-CM

## 2024-09-09 ENCOUNTER — Encounter: Payer: Self-pay | Admitting: Neurology

## 2024-09-09 ENCOUNTER — Ambulatory Visit: Admitting: Neurology

## 2024-09-09 VITALS — BP 130/77 | HR 77 | Ht 59.0 in | Wt 227.2 lb

## 2024-09-09 DIAGNOSIS — Z9189 Other specified personal risk factors, not elsewhere classified: Secondary | ICD-10-CM | POA: Diagnosis not present

## 2024-09-09 DIAGNOSIS — I6381 Other cerebral infarction due to occlusion or stenosis of small artery: Secondary | ICD-10-CM | POA: Diagnosis not present

## 2024-09-09 DIAGNOSIS — R200 Anesthesia of skin: Secondary | ICD-10-CM

## 2024-09-09 DIAGNOSIS — E119 Type 2 diabetes mellitus without complications: Secondary | ICD-10-CM

## 2024-09-09 DIAGNOSIS — R202 Paresthesia of skin: Secondary | ICD-10-CM

## 2024-09-09 DIAGNOSIS — Z7984 Long term (current) use of oral hypoglycemic drugs: Secondary | ICD-10-CM

## 2024-09-09 DIAGNOSIS — I639 Cerebral infarction, unspecified: Secondary | ICD-10-CM

## 2024-09-09 NOTE — Progress Notes (Signed)
 [facility] [address] [place]. [place] [address] [phone]       OFFICE CONSULT NOTE  Bianca Campbell Date of Birth:  November 26, 1955 Medical Record Number:  [id number]   Referring MD: Lynwood Rush  Reason for Referral: Stroke    HPI: Bianca Campbell is a 68 year old African-American lady seen today for initial office consultation visit for stroke.  She is accompanied by Bianca Campbell husband.  History is obtained from them and review of electronic medical records and I personally reviewed pertinent available imaging films in PACS.  She has past medical history of diabetes, hypertension, hyperlipidemia, obesity, arthritis and vertigo and vitamin D  deficiency.  Patient presented on 06/25/2024 with symptoms of sudden onset of right-sided numbness.  She noticed the symptoms when she first got out of bed and went to the bathroom.  She also had some intermittent vertigo which was not new and she had not had that before.  She had some trouble walking and felt Bianca Campbell balance was off.  She was seen in the hospital where CT head was unremarkable.  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  MRI scan showed acute lacunar infarct in left lateral thalamus.  Hemoglobin A1c was 7.2.  LDL cholesterol 74 mg percent.  Echocardiogram showed ejection fraction of 60 to 65%.  Mild left atrial dilatation.  She underwent for a week of external cardiac monitoring which was negative for significant arrhythmias or atrial fibrillation.  Patient started on dual antiplatelet therapy aspirin  Plavix  which she is tolerating well with minor bruising and no bleeding.  He is also tolerating Lipitor well without muscle aches and pains.  She states Bianca Campbell sugars are doing better but she has not had any follow-up lipid profile checked yet.  She has been using a walker for walking  mostly because of severe left knee pain which is from arthritis.  She is doing outpatient physical and Occupational Therapy.  She is will soon try to transition to using a cane to walk.  She has had no recurrent stroke or TIA symptoms.  ROS:   14 system review of systems is positive for numbness, knee pain, difficulty walking all other systems negative  PMH:  Past Medical History:  Diagnosis Date   ALLERGIC RHINITIS 09/11/2007   Anemia, unspecified 08/31/2012   Arthritis    knees (11/23/2016)   BACK PAIN    EXTERNAL OTITIS 09/11/2007   GERD 06/06/2007   GOUT 11/14/2007   High cholesterol    HYPERTENSION 06/06/2007   INTERMITTENT VERTIGO 09/11/2007   Irritable bowel syndrome 06/06/2007   LOW BACK PAIN 06/06/2007   OSTEOARTHRITIS 06/06/2007   PONV (postoperative nausea and vomiting)    Primary localized osteoarthritis of right knee    SHOULDER PAIN, RIGHT 12/30/2008   Vertigo of central origin 06/20/2018   Vitamin D  deficiency 03/13/2013    Social History:  Social History   Socioeconomic History   Marital status: Married    Spouse name: Dempsey   Number of children: 2   Years of education: Not on file   Highest education level: Not on file  Occupational History   Occupation: unemployed recently laid off   Occupation: Retired    Associate Professor: Happily   Tobacco Use   Smoking status: Never   Smokeless tobacco: Never  Vaping Use   Vaping status: Never Used  Substance and Sexual Activity   Alcohol use: No   Drug use: No   Sexual activity: Never  Other Topics Concern   Not on file  Social History Narrative   Not on file   Social Drivers of Health   Financial Resource Strain: Low Risk  (07/12/2024)   Overall Financial Resource Strain (CARDIA)    Difficulty of Paying Living Expenses: Not hard at all  Food Insecurity: No Food Insecurity (07/12/2024)   Hunger Vital Sign    Worried About Running Out of Food in the Last Year: Never true    Ran Out of Food in the Last Year:  Never true  Transportation Needs: No Transportation Needs (07/12/2024)   PRAPARE - Administrator, Civil Service (Medical): No    Lack of Transportation (Non-Medical): No  Physical Activity: Inactive (07/12/2024)   Exercise Vital Sign    Days of Exercise per Week: 0 days    Minutes of Exercise per Session: 0 min  Stress: No Stress Concern Present (07/12/2024)   [employer] of Occupational Health - Occupational Stress Questionnaire    Feeling of Stress: Not at all  Social Connections: Moderately Integrated (07/12/2024)   Social Connection and Isolation Panel    Frequency of Communication with Friends and Family: Three times a week    Frequency of Social Gatherings with Friends and Family: Twice a week    Attends Religious Services: More than 4 times per year    Active Member of [employer] or Organizations: No    Attends Banker Meetings: Never    Marital Status: Married  Catering Manager Violence: Not At Risk (07/12/2024)   Humiliation, Afraid, Rape, and Kick questionnaire    Fear of Current or Ex-Partner: No    Emotionally Abused: No    Physically Abused: No    Sexually Abused: No    Medications:   Current Outpatient Medications on File Prior to Visit  Medication Sig Dispense Refill   acetaminophen  (TYLENOL ) 500 MG tablet Take 1,000 mg by mouth daily as needed for mild pain (pain score 1-3), headache or moderate pain (pain score 4-6).     amLODipine  (NORVASC ) 10 MG tablet Take 1 tablet (10 mg total) by mouth daily. 90 tablet 3   Ascorbic Acid (VITAMIN C PO) Take 1 tablet by mouth daily.     aspirin  EC 81 MG tablet Take 81 mg by mouth daily. Swallow whole.Pt says told to comtinue taking ASA (as well as Plavix )     atorvastatin  (LIPITOR) 20 MG tablet Take 1 tablet (20 mg total) by mouth daily. 90 tablet 3   Barberry-Oreg Grape-Goldenseal (BERBERINE COMPLEX PO) Take 2 capsules by mouth daily.     Cholecalciferol  (VITAMIN D3) 5000 units CAPS Take 5,000 Units by  mouth daily.     CHROMIUM PO Take 1 capsule by mouth daily.     CINNAMON PO Take 250 mg by mouth 1 day or 1 dose.     clopidogrel  (PLAVIX ) 75 MG tablet Take 1 tablet (75 mg total) by mouth daily. 90 tablet 0   glucose blood (ONETOUCH VERIO) test strip Use as instructed four time per day E11.9 400 each 12   irbesartan  (AVAPRO ) 300 MG tablet Take 1 tablet (300 mg total) by mouth daily. 90 tablet 0   Lancets MISC Use as directed four times per day E11.9 400 each 11   meclizine  (ANTIVERT ) 25 MG tablet Take 1 tablet (25 mg total) by mouth 3 (three) times daily as needed  for dizziness. 30 tablet 5   meloxicam  (MOBIC ) 15 MG tablet TAKE 1 TABLET BY MOUTH ONCE DAILY AS NEEDED FOR PAIN 90 tablet 3   pantoprazole  (PROTONIX ) 40 MG tablet Take 1 tablet (40 mg total) by mouth daily. for 6 weeks, then take as needed. 30 tablet 2   traMADol  (ULTRAM ) 50 MG tablet Take 1 tablet (50 mg total) by mouth every 6 (six) hours as needed. 30 tablet 0   clopidogrel  (PLAVIX ) 75 MG tablet Take 75 mg by mouth daily. (Patient not taking: Reported on 09/09/2024)     gabapentin  (NEURONTIN ) 100 MG capsule Take 1 capsule (100 mg total) by mouth 3 (three) times daily. (Patient not taking: Reported on 09/09/2024) 90 capsule 5   metFORMIN  (GLUCOPHAGE -XR) 500 MG 24 hr tablet Take 1 tablet (500 mg total) by mouth daily with breakfast. (Patient not taking: Reported on 09/09/2024) 90 tablet 3   OVER THE COUNTER MEDICATION Take 2 capsules by mouth daily. Glucose advance     tiZANidine  (ZANAFLEX ) 4 MG tablet TAKE 1 TABLET BY MOUTH EVERY 6 TO 8 HOURS AS NEEDED (Patient not taking: Reported on 09/09/2024)     No current facility-administered medications on file prior to visit.    Allergies:   Allergies  Allergen Reactions   Latex Itching    Physical Exam General: Obese middle-aged African-American lady seated, in no evident distress Head: head normocephalic and atraumatic.   Neck: supple with no carotid or supraclavicular  bruits Cardiovascular: regular rate and rhythm, no murmurs Musculoskeletal: no deformity Skin:  no rash/petichiae Vascular:  Normal pulses all extremities  Neurologic Exam Mental Status: Awake and fully alert. Oriented to place and time. Recent and remote memory intact. Attention span, concentration and fund of knowledge appropriate. Mood and affect appropriate.  Cranial Nerves: Fundoscopic exam reveals sharp disc margins. Pupils equal, briskly reactive to light. Extraocular movements full without nystagmus. Visual fields full to confrontation. Hearing intact. Facial sensation intact. Face, tongue, palate moves normally and symmetrically.  Motor: Normal bulk and tone. Normal strength in all tested extremity muscles. Sensory.:  Diminished touch and pinprick sensation in the right upper and lower extremity but preserved vibration and position sense.  Normal sensation on the left..  Coordination: Rapid alternating movements normal in all extremities. Finger-to-nose and heel-to-shin performed accurately bilaterally. Gait and Station: Arises from chair with difficulty.  Gait is antalgic using a walker and favoring the left knee due to pain  Scheduling reflexes: 1+ and symmetric. Toes downgoing.   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  She still has persistent post stroke paresthesias.  Vascular risk factors of diabetes, hypertension, hyperlipidemia, obesity is not at risk for sleep apnea.     PLAN:I had a long d/w patient and Bianca Campbell husband about here recent thalamic lacunar stroke, intra and extracranial stenosis, poststroke paresthesias, risk for recurrent stroke/TIAs, personally independently reviewed imaging studies and stroke evaluation results and answered questions.Continue aspirin  81 mg daily and clopidogrel  75 mg daily  for secondary stroke prevention for 3 more weeks and  then stop aspirin  and stay on clopidogrel  alone and maintain strict control of hypertension with blood pressure goal below 130/90, diabetes with hemoglobin A1c goal below 6.5% and lipids with LDL cholesterol goal below 70 mg/dL. I also advised the patient to eat a healthy diet with plenty of whole grains, cereals, fruits and vegetables, exercise regularly and maintain ideal body  weight .check polysomnogram for sleep apnea.  Followup in the future with my nurse practitioner in 6 to 8 months or call earlier if necessary.   I personally spent a total of 50 minutes in the care of the patient today including getting/reviewing separately obtained history, performing a medically appropriate exam/evaluation, counseling and educating, placing orders, referring and communicating with other health care professionals, documenting clinical information in the EHR, independently interpreting results, and coordinating care.        Eather Popp, MD Note: This document was prepared with digital dictation and possible smart phrase technology. Any transcriptional errors that result from this process are unintentional.

## 2024-09-09 NOTE — Patient Instructions (Signed)
 I had a long d/w patient and her husband about here recent thalamic lacunar stroke, intra and extracranial stenosis, poststroke paresthesias, risk for recurrent stroke/TIAs, personally independently reviewed imaging studies and stroke evaluation results and answered questions.Continue aspirin  81 mg daily and clopidogrel  75 mg daily  for secondary stroke prevention for 3 more weeks and then stop aspirin  and stay on clopidogrel  alone and maintain strict control of hypertension with blood pressure goal below 130/90, diabetes with hemoglobin A1c goal below 6.5% and lipids with LDL cholesterol goal below 70 mg/dL. I also advised the patient to eat a healthy diet with plenty of whole grains, cereals, fruits and vegetables, exercise regularly and maintain ideal body weight .check polysomnogram for sleep apnea.  Followup in the future with my nurse practitioner in 6 to 8 months or call earlier if necessary.  Stroke Prevention Some medical conditions and behaviors can lead to a higher chance of having a stroke. You can help prevent a stroke by eating healthy, exercising, not smoking, and managing any medical conditions you have. Stroke is a leading cause of functional impairment. Primary prevention is particularly important because a majority of strokes are first-time events. Stroke changes the lives of not only those who experience a stroke but also their family and other caregivers. How can this condition affect me? A stroke is a medical emergency and should be treated right away. A stroke can lead to brain damage and can sometimes be life-threatening. If a person gets medical treatment right away, there is a better chance of surviving and recovering from a stroke. What can increase my risk? The following medical conditions may increase your risk of a stroke: Cardiovascular disease. High blood pressure (hypertension). Diabetes. High cholesterol. Sickle cell disease. Blood clotting disorders (hypercoagulable  state). Obesity. Sleep disorders (obstructive sleep apnea). Other risk factors include: Being older than age 10. Having a history of blood clots, stroke, or mini-stroke (transient ischemic attack, TIA). Genetic factors, such as race, ethnicity, or a family history of stroke. Smoking cigarettes or using other tobacco products. Taking birth control pills, especially if you also use tobacco. Heavy use of alcohol or drugs, especially cocaine and methamphetamine. Physical inactivity. What actions can I take to prevent this? Manage your health conditions High cholesterol levels. Eating a healthy diet is important for preventing high cholesterol. If cholesterol cannot be managed through diet alone, you may need to take medicines. Take any prescribed medicines to control your cholesterol as told by your health care provider. Hypertension. To reduce your risk of stroke, try to keep your blood pressure below 130/80. Eating a healthy diet and exercising regularly are important for controlling blood pressure. If these steps are not enough to manage your blood pressure, you may need to take medicines. Take any prescribed medicines to control hypertension as told by your health care provider. Ask your health care provider if you should monitor your blood pressure at home. Have your blood pressure checked every year, even if your blood pressure is normal. Blood pressure increases with age and some medical conditions. Diabetes. Eating a healthy diet and exercising regularly are important parts of managing your blood sugar (glucose). If your blood sugar cannot be managed through diet and exercise, you may need to take medicines. Take any prescribed medicines to control your diabetes as told by your health care provider. Get evaluated for obstructive sleep apnea. Talk to your health care provider about getting a sleep evaluation if you snore a lot or have excessive sleepiness. Make sure that  any other  medical conditions you have, such as atrial fibrillation or atherosclerosis, are managed. Nutrition Follow instructions from your health care provider about what to eat or drink to help manage your health condition. These instructions may include: Reducing your daily calorie intake. Limiting how much salt (sodium) you use to 1,500 milligrams (mg) each day. Using only healthy fats for cooking, such as olive oil, canola oil, or sunflower oil. Eating healthy foods. You can do this by: Choosing foods that are high in fiber, such as whole grains, and fresh fruits and vegetables. Eating at least 5 servings of fruits and vegetables a day. Try to fill one-half of your plate with fruits and vegetables at each meal. Choosing lean protein foods, such as lean cuts of meat, poultry without skin, fish, tofu, beans, and nuts. Eating low-fat dairy products. Avoiding foods that are high in sodium. This can help lower blood pressure. Avoiding foods that have saturated fat, trans fat, and cholesterol. This can help prevent high cholesterol. Avoiding processed and prepared foods. Counting your daily carbohydrate intake.  Lifestyle If you drink alcohol: Limit how much you have to: 0-1 drink a day for women who are not pregnant. 0-2 drinks a day for men. Know how much alcohol is in your drink. In the [place] one drink equals one 12 oz bottle of beer ( ), one 5 oz glass of wine ( ), or one 1 oz glass of hard liquor (44mL). Do not use any products that contain nicotine or tobacco. These products include cigarettes, chewing tobacco, and vaping devices, such as e-cigarettes. If you need help quitting, ask your health care provider. Avoid secondhand smoke. Do not use drugs. Activity  Try to stay at a healthy weight. Get at least 30 minutes of exercise on most days, such as: Fast walking. Biking. Swimming. Medicines Take over-the-counter and prescription medicines only as told by your health care  provider. Aspirin  or blood thinners (antiplatelets or anticoagulants) may be recommended to reduce your risk of forming blood clots that can lead to stroke. Avoid taking birth control pills. Talk to your health care provider about the risks of taking birth control pills if: You are over 16 years old. You smoke. You get very bad headaches. You have had a blood clot. Where to find more information American Stroke Association: www.strokeassociation.org Get help right away if: You or a loved one has any symptoms of a stroke. BE FAST is an easy way to remember the main warning signs of a stroke: B - Balance. Signs are dizziness, sudden trouble walking, or loss of balance. E - Eyes. Signs are trouble seeing or a sudden change in vision. F - Face. Signs are sudden weakness or numbness of the face, or the face or eyelid drooping on one side. A - Arms. Signs are weakness or numbness in an arm. This happens suddenly and usually on one side of the body. S - Speech. Signs are sudden trouble speaking, slurred speech, or trouble understanding what people say. T - Time. Time to call emergency services. Write down what time symptoms started. You or a loved one has other signs of a stroke, such as: A sudden, severe headache with no known cause. Nausea or vomiting. Seizure. These symptoms may represent a serious problem that is an emergency. Do not wait to see if the symptoms will go away. Get medical help right away. Call your local emergency services (911 in the [place] Do not drive yourself to the hospital. Summary You can help to  prevent a stroke by eating healthy, exercising, not smoking, limiting alcohol intake, and managing any medical conditions you may have. Do not use any products that contain nicotine or tobacco. These include cigarettes, chewing tobacco, and vaping devices, such as e-cigarettes. If you need help quitting, ask your health care provider. Remember BE FAST for warning signs of a  stroke. Get help right away if you or a loved one has any of these signs. This information is not intended to replace advice given to you by your health care provider. Make sure you discuss any questions you have with your health care provider. Document Revised: 09/05/2022 Document Reviewed: 09/05/2022 Elsevier Patient Education  2024 [employer].

## 2024-09-15 ENCOUNTER — Emergency Department (HOSPITAL_COMMUNITY)

## 2024-09-15 ENCOUNTER — Encounter (HOSPITAL_COMMUNITY): Payer: Self-pay | Admitting: Surgery

## 2024-09-15 ENCOUNTER — Emergency Department (HOSPITAL_COMMUNITY)
Admission: EM | Admit: 2024-09-15 | Discharge: 2024-09-15 | Disposition: A | Discharge status: Home / Self Care | Attending: Emergency Medicine | Admitting: Emergency Medicine

## 2024-09-15 ENCOUNTER — Other Ambulatory Visit: Payer: Self-pay

## 2024-09-15 DIAGNOSIS — Z7902 Long term (current) use of antithrombotics/antiplatelets: Secondary | ICD-10-CM | POA: Insufficient documentation

## 2024-09-15 DIAGNOSIS — R109 Unspecified abdominal pain: Secondary | ICD-10-CM | POA: Diagnosis present

## 2024-09-15 DIAGNOSIS — Z8719 Personal history of other diseases of the digestive system: Secondary | ICD-10-CM

## 2024-09-15 DIAGNOSIS — Z91040 Latex allergy status: Secondary | ICD-10-CM | POA: Insufficient documentation

## 2024-09-15 DIAGNOSIS — G459 Transient cerebral ischemic attack, unspecified: Secondary | ICD-10-CM | POA: Insufficient documentation

## 2024-09-15 DIAGNOSIS — Z7982 Long term (current) use of aspirin: Secondary | ICD-10-CM | POA: Insufficient documentation

## 2024-09-15 DIAGNOSIS — R110 Nausea: Secondary | ICD-10-CM | POA: Insufficient documentation

## 2024-09-15 DIAGNOSIS — R1084 Generalized abdominal pain: Secondary | ICD-10-CM | POA: Insufficient documentation

## 2024-09-15 LAB — CBC WITH DIFFERENTIAL/PLATELET
Abs Immature Granulocytes: 0.01 K/uL (ref 0.00–0.07)
Basophils Absolute: 0 K/uL (ref 0.0–0.1)
Basophils Relative: 0 %
Eosinophils Absolute: 0.1 K/uL (ref 0.0–0.5)
Eosinophils Relative: 1 %
HCT: 37.2 % (ref 36.0–46.0)
Hemoglobin: 12 g/dL (ref 12.0–15.0)
Immature Granulocytes: 0 %
Lymphocytes Relative: 28 %
Lymphs Abs: 1.7 K/uL (ref 0.7–4.0)
MCH: 26.8 pg (ref 26.0–34.0)
MCHC: 32.3 g/dL (ref 30.0–36.0)
MCV: 83 fL (ref 80.0–100.0)
Monocytes Absolute: 0.4 K/uL (ref 0.1–1.0)
Monocytes Relative: 6 %
Neutro Abs: 3.9 K/uL (ref 1.7–7.7)
Neutrophils Relative %: 65 %
Platelets: 294 K/uL (ref 150–400)
RBC: 4.48 MIL/uL (ref 3.87–5.11)
RDW: 13.7 % (ref 11.5–15.5)
WBC: 6.1 K/uL (ref 4.0–10.5)
nRBC: 0 % (ref 0.0–0.2)

## 2024-09-15 LAB — COMPREHENSIVE METABOLIC PANEL WITH GFR
ALT: 14 U/L (ref 0–44)
AST: 27 U/L (ref 15–41)
Albumin: 4.2 g/dL (ref 3.5–5.0)
Alkaline Phosphatase: 117 U/L (ref 38–126)
Anion gap: 11 (ref 5–15)
BUN: 9 mg/dL (ref 8–23)
CO2: 23 mmol/L (ref 22–32)
Calcium: 9.9 mg/dL (ref 8.9–10.3)
Chloride: 103 mmol/L (ref 98–111)
Creatinine, Ser: 0.6 mg/dL (ref 0.44–1.00)
GFR, Estimated: >60 mL/min (ref >60)
Glucose, Bld: 96 mg/dL (ref 70–99)
Potassium: 4.1 mmol/L (ref 3.5–5.1)
Sodium: 137 mmol/L (ref 135–145)
Total Bilirubin: 1.2 mg/dL (ref 0.0–1.2)
Total Protein: 8 g/dL (ref 6.5–8.1)

## 2024-09-15 LAB — URINALYSIS, ROUTINE W REFLEX MICROSCOPIC
Bilirubin Urine: NEGATIVE
Glucose, UA: NEGATIVE mg/dL
Hgb urine dipstick: NEGATIVE
Ketones, ur: NEGATIVE mg/dL
Leukocytes,Ua: NEGATIVE
Nitrite: NEGATIVE
Protein, ur: NEGATIVE mg/dL
Specific Gravity, Urine: 1.002 — ABNORMAL LOW (ref 1.005–1.030)
pH: 6 (ref 5.0–8.0)

## 2024-09-15 LAB — LIPASE, BLOOD: Lipase: 16 U/L (ref 11–51)

## 2024-09-15 MED ORDER — ONDANSETRON HCL 4 MG/2ML IJ SOLN
4.0000 mg | Freq: Once | INTRAMUSCULAR | Status: AC
  Administered 2024-09-15: 4 mg via INTRAVENOUS
  Filled 2024-09-15: qty 2

## 2024-09-15 MED ORDER — LACTATED RINGERS IV BOLUS
1000.0000 mL | Freq: Once | INTRAVENOUS | Status: AC
  Administered 2024-09-15: 1000 mL via INTRAVENOUS

## 2024-09-15 MED ORDER — DICYCLOMINE HCL 10 MG PO CAPS
10.0000 mg | ORAL_CAPSULE | Freq: Once | ORAL | Status: AC
  Administered 2024-09-15: 10 mg via ORAL
  Filled 2024-09-15: qty 1

## 2024-09-15 MED ORDER — LACTATED RINGERS IV BOLUS
500.0000 mL | Freq: Once | INTRAVENOUS | Status: AC
  Administered 2024-09-15: 500 mL via INTRAVENOUS

## 2024-09-15 MED ORDER — ONDANSETRON 4 MG PO TBDP: 4.0000 mg | ORAL_TABLET | Freq: Three times a day (TID) | ORAL | 0 refills | Status: DC | PRN

## 2024-09-15 MED ORDER — IOHEXOL 300 MG/ML  SOLN
100.0000 mL | Freq: Once | INTRAMUSCULAR | Status: AC | PRN
  Administered 2024-09-15: 100 mL via INTRAVENOUS

## 2024-09-15 MED ORDER — PANTOPRAZOLE SODIUM 40 MG IV SOLR
40.0000 mg | Freq: Once | INTRAVENOUS | Status: AC
  Administered 2024-09-15: 40 mg via INTRAVENOUS
  Filled 2024-09-15: qty 10

## 2024-09-15 MED ORDER — LIDOCAINE VISCOUS HCL 2 % MT SOLN
15.0000 mL | Freq: Once | OROMUCOSAL | Status: AC
  Administered 2024-09-15: 15 mL via ORAL
  Filled 2024-09-15: qty 15

## 2024-09-15 MED ORDER — ALUM & MAG HYDROXIDE-SIMETH 200-200-20 MG/5ML PO SUSP
30.0000 mL | Freq: Once | ORAL | Status: AC
  Administered 2024-09-15: 30 mL via ORAL
  Filled 2024-09-15: qty 30

## 2024-09-15 NOTE — ED Provider Notes (Incomplete)
 [place] EMERGENCY DEPARTMENT AT [facility] Provider Note   CSN: [id number] Arrival date & time: 09/15/24  1043     Patient presents with: Abdominal Pain   BRIANTE LOVEALL is a 68 y.o. female with a history of hernia and bowel obstruction, last surgery in 2018 - presents to the ED with stomach spasms/pain that began yesterday. The patient describes the symptoms as constant, located at epigastric, and rates the severity as 8 out of 10 when she is laying. Associated symptoms include nausea and diarrhea. The patient reports no constipation, rest pain, shortness of breath, vomiting. No recent travel. No sick contacts.   {Add pertinent medical, surgical, social history, OB history to HPI:32947}  Abdominal Pain      Prior to Admission medications   Medication Sig Start Date End Date Taking? Authorizing Provider  acetaminophen  (TYLENOL ) 500 MG tablet Take 1,000 mg by mouth daily as needed for mild pain (pain score 1-3), headache or moderate pain (pain score 4-6).    [provider]  amLODipine  (NORVASC ) 10 MG tablet Take 1 tablet (10 mg total) by mouth daily. 07/02/24   Norleen Lynwood ORN, MD  Ascorbic Acid (VITAMIN C PO) Take 1 tablet by mouth daily.    [provider]  aspirin  EC 81 MG tablet Take 81 mg by mouth daily. Swallow whole.Pt says told to comtinue taking ASA (as well as Plavix )    [provider]  atorvastatin  (LIPITOR) 20 MG tablet Take 1 tablet (20 mg total) by mouth daily. 06/27/24   Patsy Lenis, MD  Barberry-Oreg Grape-Goldenseal (BERBERINE COMPLEX PO) Take 2 capsules by mouth daily.    [provider]  Cholecalciferol  (VITAMIN D3) 5000 units CAPS Take 5,000 Units by mouth daily.    [provider]  CHROMIUM PO Take 1 capsule by mouth daily.    [provider]  CINNAMON PO Take 250 mg by mouth 1 day or 1 dose.    [provider]  clopidogrel  (PLAVIX ) 75 MG tablet Take 1 tablet (75 mg total) by mouth  daily. 06/27/24 09/25/24  Patsy Lenis, MD  clopidogrel  (PLAVIX ) 75 MG tablet Take 75 mg by mouth daily. Patient not taking: Reported on 09/09/2024    [provider]  gabapentin  (NEURONTIN ) 100 MG capsule Take 1 capsule (100 mg total) by mouth 3 (three) times daily. Patient not taking: Reported on 09/09/2024 07/15/24   Norleen Lynwood ORN, MD  glucose blood [facility] VERIO) test strip Use as instructed four time per day E11.9 01/18/24   Norleen Lynwood ORN, MD  irbesartan  (AVAPRO ) 300 MG tablet Take 1 tablet (300 mg total) by mouth daily. 06/13/24   Geofm Glade PARAS, MD  Lancets MISC Use as directed four times per day E11.9 01/18/24   Norleen Lynwood ORN, MD  meclizine  (ANTIVERT ) 25 MG tablet Take 1 tablet (25 mg total) by mouth 3 (three) times daily as needed for dizziness. 11/02/22   Norleen Lynwood ORN, MD  meloxicam  (MOBIC ) 15 MG tablet TAKE 1 TABLET BY MOUTH ONCE DAILY AS NEEDED FOR PAIN 09/13/22   Norleen Lynwood ORN, MD  metFORMIN  (GLUCOPHAGE -XR) 500 MG 24 hr tablet Take 1 tablet (500 mg total) by mouth daily with breakfast. Patient not taking: Reported on 09/09/2024 07/16/24   Norleen Lynwood ORN, MD  OVER THE COUNTER MEDICATION Take 2 capsules by mouth daily. Glucose advance    [provider]  pantoprazole  (PROTONIX ) 40 MG tablet Take 1 tablet (40 mg total) by mouth daily. for 6 weeks,  then take as needed. 08/06/24   Weaver, Scott T, PA-C  tiZANidine  (ZANAFLEX ) 4 MG tablet TAKE 1 TABLET BY MOUTH EVERY 6 TO 8 HOURS AS NEEDED Patient not taking: Reported on 09/09/2024    [provider]  traMADol  (ULTRAM ) 50 MG tablet Take 1 tablet (50 mg total) by mouth every 6 (six) hours as needed. 07/19/24   Norleen Lynwood ORN, MD    Allergies: Latex    Review of Systems  Gastrointestinal:  Positive for abdominal pain.    Updated Vital Signs BP (!) 147/93 (BP Location: Left Arm)   Pulse 100   Temp 98.3 F (36.8 C) (Oral)   Resp 16   Ht 4' 11.5 (1.511 m)   Wt 100.7 kg   SpO2 100%   BMI 44.09 kg/m   Physical  Exam  (all labs ordered are listed, but only abnormal results are displayed) Labs Reviewed - No data to display  EKG: None  Radiology: No results found.  {Document cardiac monitor, telemetry assessment procedure when appropriate:32947} Procedures   Medications Ordered in the ED - No data to display  Clinical Course as of 09/15/24 1648  Sun Sep 15, 2024  1315 Temp: 98.3 F (36.8 C) Patient tachycardic on arrival.  Patient afebrile, in no acute distress. [ML]  1411 Comprehensive metabolic panel Within normal limits.  [ML]  1412 CBC with Diff Within normal limits. [ML]  1412 Lipase, blood WNL [ML]  1412 Patient given Zofran  4 mg, LR bolus for symptomatic relief [ML]  1429 GI cocktail and Bentyl  given for symptomatic relief [ML]  1614 On reassessment, patient had relief with treatment regimen. [ML]    Clinical Course User Index [ML] Willma Duwaine CROME, PA   {Click here for ABCD2, HEART and other calculators REFRESH Note before signing:1}                              Medical Decision Making  ***  {Document critical care time when appropriate  Document review of labs and clinical decision tools ie CHADS2VASC2, etc  Document your independent review of radiology images and any outside records  Document your discussion with family members, caretakers and with consultants  Document social determinants of health affecting pt's care  Document your decision making why or why not admission, treatments were needed:32947:::1}   Final diagnoses:  None    ED Discharge Orders     None

## 2024-09-15 NOTE — ED Notes (Signed)
 Given cup of water  for PO challenge

## 2024-09-15 NOTE — ED Triage Notes (Signed)
 PT arrives to triage with complaints of stomach spasms in the upper abdomen. Pt describes a squeezing sensation that lets go intermittently. Pt denies vomiting, denies diarrhea.

## 2024-09-15 NOTE — ED Notes (Signed)
 Passed PO challenge

## 2024-09-15 NOTE — ED Notes (Signed)
 Sent urine to lab

## 2024-09-15 NOTE — Discharge Instructions (Signed)
 Thank you for visiting the Emergency Department today. It was a pleasure to be part of your healthcare team.  You were seen today in the emergency department for abdominal pain.  As discussed, please follow-up with your primary care provider within 1 week.  Please come back to the emergency department to be seen if you experience uncontrollable vomiting. At home, rest, hydrate, eat bland foods until abdominal pain subsides. It is important to watch for warning signs such as worsening pain or fever. If any of these happen, return to the Emergency Department or call 911. Thank you for trusting us  with your health.

## 2024-09-17 ENCOUNTER — Other Ambulatory Visit: Payer: Self-pay | Admitting: Internal Medicine

## 2024-09-17 ENCOUNTER — Telehealth: Payer: Self-pay

## 2024-09-17 ENCOUNTER — Other Ambulatory Visit (HOSPITAL_COMMUNITY): Payer: Self-pay

## 2024-09-17 NOTE — Transitions of Care (Post Inpatient/ED Visit) (Signed)
 09/17/2024  Name: Bianca Campbell MRN: [id number] DOB: 15-Feb-1956  Today's TOC FU Call Status:    Patient's Name and Date of Birth confirmed.    Transition Care Management Follow-up Telephone Call Date of Discharge: 09/15/24 Discharge Facility: [facility]) Type of Discharge: Emergency Department Reason for ED Visit: Other: (abdominal pain) How have you been since you were released from the hospital?: Same (still having spasms but seems to be getting better) Any questions or concerns?: No  Items Reviewed: Did you receive and understand the discharge instructions provided?: Yes Medications obtained,verified, and reconciled?: Yes (Medications Reviewed) Any new allergies since your discharge?: No Dietary orders reviewed?: NA  Medications Reviewed Today: Medications Reviewed Today     Reviewed by Wylie Chiquita BRAVO, CMA (Certified Medical Assistant) on 09/17/24 at [phone]  Med List Status: <None>   Medication Order Taking? Sig Documenting Provider Last Dose Status Informant  acetaminophen  (TYLENOL ) 500 MG tablet [id number] Yes Take 1,000 mg by mouth daily as needed for mild pain (pain score 1-3), headache or moderate pain (pain score 4-6). [provider]  Active Self, Pharmacy Records  amLODipine  (NORVASC ) 10 MG tablet [id number] Yes Take 1 tablet (10 mg total) by mouth daily. Norleen Lynwood ORN, MD  Active   Ascorbic Acid (VITAMIN C PO) [id number] Yes Take 1 tablet by mouth daily. [provider]  Active Self, Pharmacy Records  aspirin  EC 81 MG tablet [id number] Yes Take 81 mg by mouth daily. Swallow whole.Pt says told to comtinue taking ASA (as well as Plavix ) [provider]  Active Self, Pharmacy Records  atorvastatin  (LIPITOR) 20 MG tablet [id number] Yes Take 1 tablet (20 mg total) by mouth daily. Patsy Lenis, MD  Active   Barberry-Oreg Grape-Goldenseal (BERBERINE COMPLEX PO) [id number] Yes Take 2 capsules by mouth daily. [provider]  Active Self,  Pharmacy Records  Cholecalciferol  (VITAMIN D3) 5000 units CAPS [id number] Yes Take 5,000 Units by mouth daily. [provider]  Active Self, Pharmacy Records  CHROMIUM PO [id number] Yes Take 1 capsule by mouth daily. [provider]  Active Self, Pharmacy Records  CINNAMON PO [id number] Yes Take 250 mg by mouth 1 day or 1 dose. [provider]  Active   clopidogrel  (PLAVIX ) 75 MG tablet [id number] Yes Take 1 tablet (75 mg total) by mouth daily. Patsy Lenis, MD  Active   clopidogrel  (PLAVIX ) 75 MG tablet [id number]  Take 75 mg by mouth daily.  Patient not taking: Reported on 09/17/2024   [provider]  Active   gabapentin  (NEURONTIN ) 100 MG capsule [id number]  Take 1 capsule (100 mg total) by mouth 3 (three) times daily.  Patient not taking: Reported on 09/17/2024   Norleen Lynwood ORN, MD  Active   glucose blood [facility] VERIO) test strip [id number] Yes Use as instructed four time per day E11.9 Norleen Lynwood ORN, MD  Active Self, Pharmacy Records  irbesartan  (AVAPRO ) 300 MG tablet [id number] Yes Take 1 tablet (300 mg total) by mouth daily. Geofm Glade PARAS, MD  Active Self, Pharmacy Records  Lancets MISC [id number] Yes Use as directed four times per day E11.9 Norleen Lynwood ORN, MD  Active Self, Pharmacy Records  meclizine  (ANTIVERT ) 25 MG tablet [id number] Yes Take 1 tablet (25 mg total) by mouth 3 (three) times daily as needed for dizziness. Norleen Lynwood ORN, MD  Active Self, Pharmacy Records  meloxicam  (MOBIC ) 15 MG tablet [id number] Yes TAKE 1 TABLET BY MOUTH ONCE DAILY AS NEEDED FOR PAIN Norleen Lynwood ORN, MD  Active Self, Pharmacy Records  metFORMIN  (GLUCOPHAGE -XR) 500 MG 24 hr tablet [id number]  Take 1 tablet (500 mg total) by mouth daily with breakfast.  Patient not taking: Reported on 09/17/2024   Norleen Lynwood ORN, MD  Active   ondansetron  (ZOFRAN -ODT) 4 MG disintegrating tablet [id number]  Take 1 tablet (4 mg total) by mouth every 8 (eight) hours as needed for nausea or vomiting.   Patient not taking: Reported on 09/17/2024   Willma Duwaine CROME, [place]  Active   OVER THE COUNTER MEDICATION [id number] Yes Take 2 capsules by mouth daily. Glucose advance [provider]  Active Self, Pharmacy Records  pantoprazole  (PROTONIX ) 40 MG tablet [id number] Yes Take 1 tablet (40 mg total) by mouth daily. for 6 weeks, then take as needed. Lelon Hamilton T, PA-C  Active   tiZANidine  (ZANAFLEX ) 4 MG tablet [id number]  TAKE 1 TABLET BY MOUTH EVERY 6 TO 8 HOURS AS NEEDED  Patient not taking: Reported on 09/17/2024   [provider]  Active   traMADol  (ULTRAM ) 50 MG tablet [id number] Yes Take 1 tablet (50 mg total) by mouth every 6 (six) hours as needed. Norleen Lynwood ORN, MD  Active             Home Care and Equipment/Supplies: Were Home Health Services Ordered?: NA Any new equipment or medical supplies ordered?: NA  Functional Questionnaire: Do you need assistance with bathing/showering or dressing?: No Do you need assistance with meal preparation?: No Do you need assistance with eating?: No Do you have difficulty maintaining continence: No Do you need assistance with getting out of bed/getting out of a chair/moving?: No Do you have difficulty managing or taking your medications?: No  Follow up appointments reviewed: PCP Follow-up appointment confirmed?: No MD Provider Line [phone] Given: No Specialist Hospital Follow-up appointment confirmed?: NA Do you need transportation to your follow-up appointment?: No Do you understand care options if your condition(s) worsen?: Yes-patient verbalized understanding  Patient states she doesn't feel she needs hospital follow up appointment at this time but will call back office after a couple days if it hasn't resolved.   SIGNATURE: Chiquita Glaze, CMA II

## 2024-09-19 NOTE — ED Provider Notes (Signed)
 [place] EMERGENCY DEPARTMENT AT [facility] Provider Note   CSN: [id number] Arrival date & time: 09/15/24  1043     Patient presents with: Abdominal Pain   Bianca Campbell is a 68 y.o. female with a history of bowel obstruction in 2018, presents to the ED with abdominal pain that began yesterday. The symptoms started as what she describes as painful spasms in epigastric region, and have been wax/wane since onset. Associated symptoms include nausea without vomiting and diarrhea. The patient reports no fevers, chest pain, or shortness of breath. The patient denies taking any OTC medications at home for the discomfort. Patient is in no acute distress.   Abdominal Pain       Prior to Admission medications   Medication Sig Start Date End Date Taking? Authorizing Provider  ondansetron  (ZOFRAN -ODT) 4 MG disintegrating tablet Take 1 tablet (4 mg total) by mouth every 8 (eight) hours as needed for nausea or vomiting. Patient not taking: Reported on 09/17/2024 09/15/24  Yes Demond Shallenberger L, PA  acetaminophen  (TYLENOL ) 500 MG tablet Take 1,000 mg by mouth daily as needed for mild pain (pain score 1-3), headache or moderate pain (pain score 4-6).    [provider]  amLODipine  (NORVASC ) 10 MG tablet Take 1 tablet (10 mg total) by mouth daily. 07/02/24   Norleen Lynwood ORN, MD  Ascorbic Acid (VITAMIN C PO) Take 1 tablet by mouth daily.    [provider]  aspirin  EC 81 MG tablet Take 81 mg by mouth daily. Swallow whole.Pt says told to comtinue taking ASA (as well as Plavix )    [provider]  atorvastatin  (LIPITOR) 20 MG tablet Take 1 tablet (20 mg total) by mouth daily. 06/27/24   Patsy Lenis, MD  Barberry-Oreg Grape-Goldenseal (BERBERINE COMPLEX PO) Take 2 capsules by mouth daily.    [provider]  Cholecalciferol  (VITAMIN D3) 5000 units CAPS Take 5,000 Units by mouth daily.    [provider]  CHROMIUM PO Take 1 capsule by mouth daily.     [provider]  CINNAMON PO Take 250 mg by mouth 1 day or 1 dose.    [provider]  clopidogrel  (PLAVIX ) 75 MG tablet Take 1 tablet (75 mg total) by mouth daily. 06/27/24 09/25/24  Patsy Lenis, MD  clopidogrel  (PLAVIX ) 75 MG tablet Take 75 mg by mouth daily. Patient not taking: Reported on 09/17/2024    [provider]  gabapentin  (NEURONTIN ) 100 MG capsule Take 1 capsule (100 mg total) by mouth 3 (three) times daily. Patient not taking: Reported on 09/17/2024 07/15/24   Norleen Lynwood ORN, MD  glucose blood [facility] VERIO) test strip Use as instructed four time per day E11.9 01/18/24   Norleen Lynwood ORN, MD  irbesartan  (AVAPRO ) 300 MG tablet Take 1 tablet by mouth once daily 09/17/24   Norleen Lynwood ORN, MD  Lancets MISC Use as directed four times per day E11.9 01/18/24   Norleen Lynwood ORN, MD  meclizine  (ANTIVERT ) 25 MG tablet Take 1 tablet (25 mg total) by mouth 3 (three) times daily as needed for dizziness. 11/02/22   Norleen Lynwood ORN, MD  meloxicam  (MOBIC ) 15 MG tablet TAKE 1 TABLET BY MOUTH ONCE DAILY AS NEEDED FOR PAIN 09/13/22   Norleen Lynwood ORN, MD  metFORMIN  (GLUCOPHAGE -XR) 500 MG 24 hr tablet Take 1 tablet (500 mg total) by mouth daily with breakfast. Patient not taking: Reported on 09/17/2024 07/16/24   Norleen Lynwood ORN, MD  OVER THE COUNTER MEDICATION Take  2 capsules by mouth daily. Glucose advance    [provider]  pantoprazole  (PROTONIX ) 40 MG tablet Take 1 tablet (40 mg total) by mouth daily. for 6 weeks, then take as needed. 08/06/24   Weaver, Scott T, PA-C  tiZANidine  (ZANAFLEX ) 4 MG tablet TAKE 1 TABLET BY MOUTH EVERY 6 TO 8 HOURS AS NEEDED Patient not taking: Reported on 09/17/2024    [provider]  traMADol  (ULTRAM ) 50 MG tablet Take 1 tablet (50 mg total) by mouth every 6 (six) hours as needed. 07/19/24   Norleen Lynwood ORN, MD    Allergies: Latex    Review of Systems  Gastrointestinal:  Positive for abdominal pain.    Updated Vital Signs BP 137/82    Pulse 84   Temp 98.2 F (36.8 C) (Oral)   Resp 16   Ht 4' 11.5 (1.511 m)   Wt 100.7 kg   SpO2 98%   BMI 44.09 kg/m   Physical Exam Vitals and nursing note reviewed.  Constitutional:      General: She is not in acute distress.    Appearance: Normal appearance.  HENT:     Head: Normocephalic and atraumatic.  Eyes:     Extraocular Movements: Extraocular movements intact.     Conjunctiva/sclera: Conjunctivae normal.     Pupils: Pupils are equal, round, and reactive to light.  Cardiovascular:     Rate and Rhythm: Normal rate and regular rhythm.     Pulses: Normal pulses.  Pulmonary:     Effort: Pulmonary effort is normal. No respiratory distress.  Abdominal:     General: Abdomen is flat.     Palpations: Abdomen is soft.     Tenderness: There is no abdominal tenderness.     Comments: Generalized abdominal tenderness throughout.  Patient does note increased tenderness to epigastric region.  Denies CVA tenderness, no guarding, rebound, or distention.    Musculoskeletal:        General: Normal range of motion.     Cervical back: Normal range of motion.  Skin:    General: Skin is warm and dry.     Capillary Refill: Capillary refill takes less than 2 seconds.  Neurological:     General: No focal deficit present.     Mental Status: She is alert. Mental status is at baseline.  Psychiatric:        Mood and Affect: Mood normal.     (all labs ordered are listed, but only abnormal results are displayed) Labs Reviewed  URINALYSIS, ROUTINE W REFLEX MICROSCOPIC - Abnormal; Notable for the following components:      Result Value   Color, Urine STRAW (*)    Specific Gravity, Urine 1.002 (*)    All other components within normal limits  COMPREHENSIVE METABOLIC PANEL WITH GFR  LIPASE, BLOOD  CBC WITH DIFFERENTIAL/PLATELET    EKG: None  Radiology: No results found.   Procedures   Medications Ordered in the ED  ondansetron  (ZOFRAN ) injection 4 mg (4 mg Intravenous Given  09/15/24 1335)  lactated ringers  bolus 500 mL (0 mLs Intravenous Stopped 09/15/24 1519)  iohexol  (OMNIPAQUE ) 300 MG/ML solution 100 mL (100 mLs Intravenous Contrast Given 09/15/24 1450)  dicyclomine  (BENTYL ) capsule 10 mg (10 mg Oral Given 09/15/24 1534)  alum & mag hydroxide-simeth (MAALOX/MYLANTA) 200-200-20 MG/5ML suspension 30 mL (30 mLs Oral Given 09/15/24 1534)    And  lidocaine  (XYLOCAINE ) 2 % viscous mouth solution 15 mL (15 mLs Oral Given 09/15/24 1535)  pantoprazole  (PROTONIX ) injection 40 mg (  40 mg Intravenous Given 09/15/24 1535)  lactated ringers  bolus 1,000 mL (0 mLs Intravenous Stopped 09/15/24 1905)                                 Medical Decision Making  Patient presents to the ED for: Abdominal pain This involves an extensive number of treatment options and is a complaint that carries with it a high risk of complications  Differential diagnosis includes: Obstruction/perforation Pancreatitis Gastroenteritis Infectious etiology Co-morbid conditions: Hypertension  Additional history/records obtained and reviewed: Additional history obtained from  outside medical records External records from outside source obtained and reviewed including prior PCP records for medical history and current medication regimen.  Clinical Course as of 09/23/24 0935  Sun Sep 15, 2024  1315 Temp: 98.3 F (36.8 C) Patient tachycardic on arrival.  Patient afebrile, in no acute distress. [ML]  1411 Comprehensive metabolic panel Within normal limits.  [ML]  1412 CBC with Diff Within normal limits. [ML]  1412 Lipase, blood WNL [ML]  1412 Patient given Zofran  4 mg, LR bolus for symptomatic relief [ML]  1429 GI cocktail and Bentyl  given for symptomatic relief [ML]  1532 CT ABDOMEN PELVIS W CONTRAST Reading of possible low-grade partial obstruction versus chronic findings - consult gen surg [ML]  1614 On reassessment, patient had relief with treatment regimen. [ML]  1756 Per gen surg - trial  PO challenge and if tolerated good for discharge [ML]  1800 Pulse Rate: 71 Vital stable, nontachycardic [ML]    Clinical Course User Index [ML] Willma Duwaine CROME, PA    Data Reviewed / Actions Taken: Labs ordered/reviewed with my independent interpretation in ED course above. Imaging ordered/reviewed with my independent interpretation in ED course above. I agree with the radiologists interpretation.   Management / Treatments: See ED course above for medications, treatments administered, and clinical rationale.   Reevaluation of the patient after these medicines showed that the patient improved. I have reviewed the patients home medicines and have made adjustments as needed  ED Course / Reassessments: Problem List: abdominal pain 68 year old female presented for abdominal pain. Initial assessment included history, physical exam, and review of prior medical records. Patients physical exam was notable for generalized tenderness. Laboratory studies were completed and found to be unremarkable.  CT imaging showed possible partial obstruction versus chronic process-General Surgery was contacted and relayed that finding is most likely chronic finding given patients history of multiple abdominal surgeries. Pain and symptoms were addressed during the visit. Vital signs were obtained and monitored, and the patient remained stable throughout the stay.  Given patient's physical exam, unremarkable laboratory studies, CT imaging indicative of chronic process, patient's improvement with medication regimen, and patient tolerating PO well - plan to discharge home with supportive care measures for most likely gastroenteritis abdominal pain. Patient response: improved Serial reassessments performed: Yes    Consultations:  General surgery Consult recommendations incorporated into plan: Given patient's CT imaging, general surgery was contacted for further - per Dr. Sheldon, given patient's multiple abdominal surgeries,  most likely a chronic finding versus small obstruction -if patient tolerates PO challenge, she is expected to be cleared for discharge.  Disposition: Disposition: Discharge with close follow-up with PCP for further evaluation and care.  Patient given strict return precautions if she continues to experience abdominal pain. Rationale for disposition: stable for discharge The disposition plan and rationale were discussed with the patient at the bedside, all questions were addressed, and  the patient demonstrated understanding.  This note was produced using Electronics Engineer. While I have reviewed and verified all clinical information, transcription errors may remain.      Final diagnoses:  Abdominal pain, unspecified abdominal location  Nausea    ED Discharge Orders          Ordered    ondansetron  (ZOFRAN -ODT) 4 MG disintegrating tablet  Every 8 hours PRN        09/15/24 1949               Bianca Markovic L, PA 09/23/24 9047    Freddi Hamilton, MD 09/23/24 [phone]

## 2024-10-15 ENCOUNTER — Ambulatory Visit: Admitting: Internal Medicine

## 2024-10-15 ENCOUNTER — Encounter: Payer: Self-pay | Admitting: Internal Medicine

## 2024-10-15 VITALS — BP 126/78 | HR 75 | Temp 98.5°F | Ht 59.5 in | Wt 222.0 lb

## 2024-10-15 DIAGNOSIS — E559 Vitamin D deficiency, unspecified: Secondary | ICD-10-CM | POA: Diagnosis not present

## 2024-10-15 DIAGNOSIS — M5431 Sciatica, right side: Secondary | ICD-10-CM | POA: Diagnosis not present

## 2024-10-15 DIAGNOSIS — E1165 Type 2 diabetes mellitus with hyperglycemia: Secondary | ICD-10-CM

## 2024-10-15 DIAGNOSIS — M1712 Unilateral primary osteoarthritis, left knee: Secondary | ICD-10-CM

## 2024-10-15 DIAGNOSIS — I10 Essential (primary) hypertension: Secondary | ICD-10-CM

## 2024-10-15 MED ORDER — ATORVASTATIN CALCIUM 20 MG PO TABS: 20.0000 mg | ORAL_TABLET | Freq: Every day | ORAL | 3 refills | Status: AC

## 2024-10-15 MED ORDER — HYDROCODONE-ACETAMINOPHEN 5-325 MG PO TABS: 1.0000 | ORAL_TABLET | Freq: Four times a day (QID) | ORAL | 0 refills | Status: AC | PRN

## 2024-10-15 MED ORDER — IRBESARTAN 300 MG PO TABS: 300.0000 mg | ORAL_TABLET | Freq: Every day | ORAL | 3 refills | Status: AC

## 2024-10-15 MED ORDER — TIZANIDINE HCL 4 MG PO TABS: 4.0000 mg | ORAL_TABLET | Freq: Four times a day (QID) | ORAL | 5 refills | Status: AC | PRN

## 2024-10-15 MED ORDER — PANTOPRAZOLE SODIUM 40 MG PO TBEC: 40.0000 mg | DELAYED_RELEASE_TABLET | Freq: Every day | ORAL | 3 refills | Status: AC

## 2024-10-15 MED ORDER — GABAPENTIN 400 MG PO TABS: 400.0000 mg | ORAL_TABLET | Freq: Three times a day (TID) | ORAL | 11 refills | Status: AC

## 2024-10-15 MED ORDER — AMLODIPINE BESYLATE 10 MG PO TABS: 10.0000 mg | ORAL_TABLET | Freq: Every day | ORAL | 3 refills | Status: AC

## 2024-10-15 NOTE — Patient Instructions (Signed)
 Ok to change the tramadol  that does not help to the hydrocodone  5 325 mg as needed only]  Ok for restart tizanidine  4 mg as needed  Ok to increase the gabapentin  to 400 mg three times daily  Please continue all other medications as before, and refills have been done if requested.  Please have the pharmacy call with any other refills you may need.  Please keep your appointments with your specialists as you may have planned  Please make an Appointment to return in 6 months, or sooner if needed

## 2024-10-15 NOTE — Progress Notes (Unsigned)
 Patient ID: Bianca Campbell, female   DOB: July 25, 1956, 68 y.o.   MRN: [id number]        Chief Complaint: follow up ED visit nov 30 with abd pain       HPI:  Bianca Campbell is a 68 y.o. female here with c/o         Due for colonoscopy in feb 2026, just no appt yet.   Wt Readings from Last 3 Encounters:  10/15/24 222 lb (100.7 kg)  09/15/24 222 lb (100.7 kg)  09/09/24 227 lb 3.2 oz (103.1 kg)   BP Readings from Last 3 Encounters:  10/15/24 126/78  09/15/24 137/82  09/09/24 130/77         Past Medical History:  Diagnosis Date   ALLERGIC RHINITIS 09/11/2007   Anemia, unspecified 08/31/2012   Arthritis    knees (11/23/2016)   BACK PAIN    EXTERNAL OTITIS 09/11/2007   GERD 06/06/2007   GOUT 11/14/2007   High cholesterol    HYPERTENSION 06/06/2007   INTERMITTENT VERTIGO 09/11/2007   Irritable bowel syndrome 06/06/2007   LOW BACK PAIN 06/06/2007   OSTEOARTHRITIS 06/06/2007   PONV (postoperative nausea and vomiting)    Primary localized osteoarthritis of right knee    SHOULDER PAIN, RIGHT 12/30/2008   Vertigo of central origin 06/20/2018   Vitamin D  deficiency 03/13/2013   Past Surgical History:  Procedure Laterality Date   27 HOUR PH STUDY N/A 05/27/2019   Procedure: 24 HOUR PH STUDY;  Surgeon: Albertus Gordy HERO, MD;  Location: [facility];  Service: Gastroenterology;  Laterality: N/A;   ABDOMINAL WALL MESH  REMOVAL  11/27/2000   with LOA for SBO   BILATERAL SALPINGOOPHORECTOMY  10/20/2009   Dr Mat.  done w open LOA Dr Gail   CESAREAN SECTION  1987   CESAREAN SECTION  1991   COLONOSCOPY  2015   pt stated 2015 or 2016 unknown md   ESOPHAGEAL MANOMETRY N/A 05/27/2019   Procedure: ESOPHAGEAL MANOMETRY (EM) WITH IMPEDENCE STUDY;  Surgeon: Albertus Gordy HERO, MD;  Location: [facility];  Service: Gastroenterology;  Laterality: N/A;   INCISIONAL HERNIA REPAIR  1996   with mesh   INCISIONAL HERNIA REPAIR  09/03/2001   open re-repair w mesh.  Dr Adel COAST HERNIA  REPAIR  12/31/2002   Incarcerated hernia.  open LOA & 2nd re-repair w mesh  Dr Adel COAST HERNIA REPAIR  09/09/2004   Recurrent incarcerted hernia.  LOA & open 3rd re-repair.  Dr Gail   JOINT REPLACEMENT     KNEE ARTHROSCOPY Bilateral 11/11/2015   LAPAROSCOPIC CHOLECYSTECTOMY  1991   LYSIS OF ADHESION  11/27/2000   for SBO.  Open LOA.  Dr Adel   LYSIS OF ADHESION  10/20/2009   lap c/v open LOA for BSO.   Dr Gail   OVARIAN CYST REMOVAL  1993   TOTAL KNEE ARTHROPLASTY Right 11/21/2016   Procedure: RIGHT TOTAL KNEE ARTHROPLASTY;  Surgeon: Lamar Millman, MD;  Location: [facility];  Service: Orthopedics;  Laterality: Right;    reports that she has never smoked. She has never used smokeless tobacco. She reports that she does not drink alcohol and does not use drugs. family history includes Arthritis in her mother; Asthma in her daughter; CAD in her mother; Colon polyps (age of onset: 9) in her father; Diabetes in her mother; Healthy in her brother, sister, and son; Hypertension in her mother; Seizures in her mother; Stroke in her father. Allergies[1] Medications Ordered  Prior to Encounter[2]      ROS:  All others reviewed and negative.  Objective        PE:  BP 126/78 (BP Location: Left Arm, Patient Position: Sitting, Cuff Size: Normal)   Pulse 75   Temp 98.5 F (36.9 C) (Oral)   Ht 4' 11.5 (1.511 m)   Wt 222 lb (100.7 kg)   SpO2 99%   BMI 44.09 kg/m                 Constitutional: Pt appears in NAD               HENT: Head: NCAT.                Right Ear: External ear normal.                 Left Ear: External ear normal.                Eyes: . Pupils are equal, round, and reactive to light. Conjunctivae and EOM are normal               Nose: without d/c or deformity               Neck: Neck supple. Gross normal ROM               Cardiovascular: Normal rate and regular rhythm.                 Pulmonary/Chest: Effort normal and breath sounds without rales or wheezing.                 Abd:  Soft, NT, ND, + BS, no organomegaly               Neurological: Pt is alert. At baseline orientation, motor grossly intact               Skin: Skin is warm. No rashes, no other new lesions, LE edema - ***               Psychiatric: Pt behavior is normal without agitation   Micro: none  Cardiac tracings I have personally interpreted today:  none  Pertinent Radiological findings (summarize): none   Lab Results  Component Value Date   WBC 6.1 09/15/2024   HGB 12.0 09/15/2024   HCT 37.2 09/15/2024   PLT 294 09/15/2024   GLUCOSE 96 09/15/2024   CHOL 154 07/15/2024   TRIG 48.0 07/15/2024   HDL 70.50 07/15/2024   LDLDIRECT 114.4 03/13/2013   LDLCALC 74 07/15/2024   ALT 14 09/15/2024   AST 27 09/15/2024   NA 137 09/15/2024   K 4.1 09/15/2024   CL 103 09/15/2024   CREATININE 0.60 09/15/2024   BUN 9 09/15/2024   CO2 23 09/15/2024   TSH 1.94 07/15/2024   INR 1.0 06/25/2024   HGBA1C 7.2 (H) 07/15/2024   MICROALBUR 0.8 07/15/2024   Assessment/Plan:  CECEILIA CEPHUS is a 68 y.o. Black or African American [2] female with  has a past medical history of ALLERGIC RHINITIS (09/11/2007), Anemia, unspecified (08/31/2012), Arthritis, BACK PAIN, EXTERNAL OTITIS (09/11/2007), GERD (06/06/2007), GOUT (11/14/2007), High cholesterol, HYPERTENSION (06/06/2007), INTERMITTENT VERTIGO (09/11/2007), Irritable bowel syndrome (06/06/2007), LOW BACK PAIN (06/06/2007), OSTEOARTHRITIS (06/06/2007), PONV (postoperative nausea and vomiting), Primary localized osteoarthritis of right knee, SHOULDER PAIN, RIGHT (12/30/2008), Vertigo of central origin (06/20/2018), and Vitamin D  deficiency (03/13/2013).  No problem-specific Assessment & Plan notes found for this encounter.  Followup:  No follow-ups on file.  Lynwood Rush, MD 10/15/2024 3:24 PM [facility]    [1]  Allergies Allergen Reactions   Latex Itching  [2]   Current Outpatient Medications on File Prior to Visit  Medication Sig Dispense Refill   acetaminophen  (TYLENOL ) 500 MG tablet Take 1,000 mg by mouth daily as needed for mild pain (pain score 1-3), headache or moderate pain (pain score 4-6).     amLODipine  (NORVASC ) 10 MG tablet Take 1 tablet (10 mg total) by mouth daily. 90 tablet 3   Ascorbic Acid (VITAMIN C PO) Take 1 tablet by mouth daily.     aspirin  EC 81 MG tablet Take 81 mg by mouth daily. Swallow whole.Pt says told to comtinue taking ASA (as well as Plavix )     atorvastatin  (LIPITOR) 20 MG tablet Take 1 tablet (20 mg total) by mouth daily. 90 tablet 3   Barberry-Oreg Grape-Goldenseal (BERBERINE COMPLEX PO) Take 2 capsules by mouth daily.     Cholecalciferol  (VITAMIN D3) 5000 units CAPS Take 5,000 Units by mouth daily.     CHROMIUM PO Take 1 capsule by mouth daily.     CINNAMON PO Take 250 mg by mouth 1 day or 1 dose.     clopidogrel  (PLAVIX ) 75 MG tablet Take 75 mg by mouth daily. (Patient not taking: Reported on 09/17/2024)     gabapentin  (NEURONTIN ) 100 MG capsule Take 1 capsule (100 mg total) by mouth 3 (three) times daily. (Patient not taking: Reported on 09/17/2024) 90 capsule 5   glucose blood (ONETOUCH VERIO) test strip Use as instructed four time per day E11.9 400 each 12   irbesartan  (AVAPRO ) 300 MG tablet Take 1 tablet by mouth once daily 90 tablet 0   Lancets MISC Use as directed four times per day E11.9 400 each 11   meclizine  (ANTIVERT ) 25 MG tablet Take 1 tablet (25 mg total) by mouth 3 (three) times daily as needed for dizziness. 30 tablet 5   meloxicam  (MOBIC ) 15 MG tablet TAKE 1 TABLET BY MOUTH ONCE DAILY AS NEEDED FOR PAIN 90 tablet 3   metFORMIN  (GLUCOPHAGE -XR) 500 MG 24 hr tablet Take 1 tablet (500 mg total) by mouth daily with breakfast. (Patient not taking: Reported on 09/17/2024) 90 tablet 3   ondansetron  (ZOFRAN -ODT) 4 MG disintegrating tablet Take 1 tablet (4 mg total) by mouth every 8 (eight) hours as needed for  nausea or vomiting. (Patient not taking: Reported on 09/17/2024) 20 tablet 0   OVER THE COUNTER MEDICATION Take 2 capsules by mouth daily. Glucose advance     pantoprazole  (PROTONIX ) 40 MG tablet Take 1 tablet (40 mg total) by mouth daily. for 6 weeks, then take as needed. 30 tablet 2   tiZANidine  (ZANAFLEX ) 4 MG tablet TAKE 1 TABLET BY MOUTH EVERY 6 TO 8 HOURS AS NEEDED (Patient not taking: Reported on 09/17/2024)     traMADol  (ULTRAM ) 50 MG tablet Take 1 tablet (50 mg total) by mouth every 6 (six) hours as needed. 30 tablet 0   No current facility-administered medications on file prior to visit.

## 2024-10-16 ENCOUNTER — Encounter: Payer: Self-pay | Admitting: Internal Medicine

## 2024-10-16 NOTE — Assessment & Plan Note (Signed)
 Last vitamin D  Lab Results  Component Value Date   VD25OH 30.09 07/15/2024   Low, to start oral replacement

## 2024-10-16 NOTE — Assessment & Plan Note (Signed)
 With flare in the past wk, declines prednisone  but ok for increased gabapentin  400 tid,  to f/u any worsening symptoms or concerns

## 2024-10-16 NOTE — Assessment & Plan Note (Signed)
 Severe, end stage, needs TKR, still trying to decide to have it done, current pain severe uncontrolled, pt for hydrocodone  5 325 prn limited rx,  pt to f/u with ortho

## 2024-10-16 NOTE — Assessment & Plan Note (Signed)
 BP Readings from Last 3 Encounters:  10/15/24 126/78  09/15/24 137/82  09/09/24 130/77   Stable, pt to continue medical treatment norvasc  10 every day, avapro  300 qd

## 2024-10-16 NOTE — Assessment & Plan Note (Signed)
 Lab Results  Component Value Date   HGBA1C 7.2 (H) 07/15/2024   Uncontrolled mild, for DM diet and wt control, declines other med for now

## 2024-10-29 NOTE — Assessment & Plan Note (Signed)
 History of stroke in 06/2024. Imaging was consistent with a small vessel disease. Her monitor demonstrated no atrial fibrillation. Echocardiogram showed normal EF and no cardiac source of embolus. CT scan of head and neck showed no significant carotid artery disease. She remains on Clopidogrel  and Atorvastatin .

## 2024-10-29 NOTE — Progress Notes (Unsigned)
"    ° ° °  OFFICE NOTE:    Date:  10/30/2024  ID:  REKISHA WELLING, DOB 09/24/1956, MRN [id number] PCP: Norleen Lynwood ORN, MD  [facility] Providers Cardiologist:  None Cardiology APP:  Lelon Hamilton T, PA-C        Hx of CVA 06/2024 TTE 06/25/24: EF 60-65, no RWMA, normal RVSF, normal PASP, RVSP 19.8, mild-moderate LAE Coronary artery Ca2+ MPI 07/04/2017: Low risk, EF 64 CAC score 08/07/2023: 10 (33rd percentile) Carotid stenosis Carotid US  11/12/2018: Bilateral ICA 1-39 Hypertension  Hyperlipidemia  Diabetes mellitus type 2       Discussed the use of AI scribe software for clinical note transcription with the patient, who gave verbal consent to proceed. History of Present Illness Bianca Campbell is a 69 y.o. female who returns for follow up of chest pain. She was admitted in September 2025 with a left thalamic lacunar infarct.  Imaging was consistent with small vessel disease.  Follow-up event monitor demonstrated no atrial fibrillation.  She was evaluated in 08/06/2024 for posthospitalization follow-up.  She noted intermittent chest pain that sounded most consistent with acid reflux disease.  I placed her on pantoprazole  with plans to pursue ischemic workup if her symptoms did not improve.  She experiences ongoing fatigue and right-sided weakness. She has not had shortness of breath or syncope. She has not had further chest discomfort, pressure, or other symptoms since starting the Protonix .     ROS-See HPI    Studies Reviewed:              Physical Exam:  VS:  BP 132/82   Pulse 88   Ht 4' 11.5 (1.511 m)   Wt 222 lb (100.7 kg)   SpO2 97%   BMI 44.09 kg/m        Wt Readings from Last 3 Encounters:  10/30/24 222 lb (100.7 kg)  10/15/24 222 lb (100.7 kg)  09/15/24 222 lb (100.7 kg)    Constitutional:      Appearance: Healthy appearance. Not in distress.  Pulmonary:     Breath sounds: No wheezing. No rales.  Cardiovascular:     Normal rate. Regular rhythm.      Murmurs: There is no murmur.        Assessment and Plan:    Assessment & Plan History of stroke History of stroke in 06/2024. Imaging was consistent with a small vessel disease. Her monitor demonstrated no atrial fibrillation. Echocardiogram showed normal EF and no cardiac source of embolus. CT scan of head and neck showed no significant carotid artery disease. She remains on Clopidogrel  and Atorvastatin .  Coronary artery calcification At last visit, she noted intermittent chest pain that sounded consistent with acid reflux.  She was placed on pantoprazole . Her symptoms have resolved. She has not had further chest pain, shortness of breath.  - Continue management of risk factors with PCP - She can obtain future refills of Pantoprazole  with PCP, if this needs to be continued.  Essential hypertension The patient's blood pressure is controlled on her current regimen.   Pure hypercholesterolemia Managed by PCP.         Dispo:  Return for follow up as needed.  Signed, Hamilton Lelon, PA-C   "

## 2024-10-30 ENCOUNTER — Encounter: Payer: Self-pay | Admitting: Physician Assistant

## 2024-10-30 ENCOUNTER — Ambulatory Visit: Attending: Cardiology | Admitting: Physician Assistant

## 2024-10-30 VITALS — BP 132/82 | HR 88 | Ht 59.5 in | Wt 222.0 lb

## 2024-10-30 DIAGNOSIS — I2510 Atherosclerotic heart disease of native coronary artery without angina pectoris: Secondary | ICD-10-CM | POA: Diagnosis not present

## 2024-10-30 DIAGNOSIS — Z8673 Personal history of transient ischemic attack (TIA), and cerebral infarction without residual deficits: Secondary | ICD-10-CM | POA: Diagnosis not present

## 2024-10-30 DIAGNOSIS — I10 Essential (primary) hypertension: Secondary | ICD-10-CM

## 2024-10-30 DIAGNOSIS — E7800 Pure hypercholesterolemia, unspecified: Secondary | ICD-10-CM | POA: Diagnosis not present

## 2024-10-30 NOTE — Patient Instructions (Signed)
 Medication Instructions:  No medication changes were made during today's visit.  *If you need a refill on your cardiac medications before your next appointment, please call your pharmacy*   Lab Work: No labs were ordered during today's visit.  If you have labs (blood work) drawn today and your tests are completely normal, you will receive your results only by: MyChart Message (if you have MyChart) OR A paper copy in the mail If you have any lab test that is abnormal or we need to change your treatment, we will call you to review the results.   Testing/Procedures: No procedures were ordered during today's visit.     Your next appointment:  as needed for cardiac needs    Follow-Up: At [facility], you and your health needs are our priority.  As part of our continuing mission to provide you with exceptional heart care, we have created designated Provider Care Teams.  These Care Teams include your primary Cardiologist (physician) and Advanced Practice Providers (APPs -  Physician Assistants and Nurse Practitioners) who all work together to provide you with the care you need, when you need it. We recommend signing up for the patient portal called MyChart.  Sign up information is provided on this After Visit Summary.  MyChart is used to connect with patients for Virtual Visits (Telemedicine).  Patients are able to view lab/test results, encounter notes, upcoming appointments, etc.  Non-urgent messages can be sent to your provider as well.   To learn more about what you can do with MyChart, go to [email or web address].

## 2024-10-30 NOTE — Assessment & Plan Note (Signed)
The patient's blood pressure is controlled on her current regimen.   

## 2024-10-30 NOTE — Assessment & Plan Note (Signed)
-   Managed by PCP

## 2024-11-07 ENCOUNTER — Encounter: Payer: Self-pay | Admitting: Neurology

## 2024-11-07 ENCOUNTER — Ambulatory Visit: Admitting: Neurology

## 2024-11-07 VITALS — BP 138/81 | HR 74 | Ht 59.0 in | Wt 224.6 lb

## 2024-11-07 DIAGNOSIS — R0683 Snoring: Secondary | ICD-10-CM | POA: Diagnosis not present

## 2024-11-07 DIAGNOSIS — Z6841 Body Mass Index (BMI) 40.0 and over, adult: Secondary | ICD-10-CM

## 2024-11-07 DIAGNOSIS — I6381 Other cerebral infarction due to occlusion or stenosis of small artery: Secondary | ICD-10-CM

## 2024-11-07 DIAGNOSIS — R030 Elevated blood-pressure reading, without diagnosis of hypertension: Secondary | ICD-10-CM

## 2024-11-07 DIAGNOSIS — Z9189 Other specified personal risk factors, not elsewhere classified: Secondary | ICD-10-CM | POA: Diagnosis not present

## 2024-11-07 DIAGNOSIS — R351 Nocturia: Secondary | ICD-10-CM | POA: Diagnosis not present

## 2024-11-07 NOTE — Patient Instructions (Signed)

## 2024-11-07 NOTE — Progress Notes (Signed)
 Subjective:    Patient ID: Bianca Campbell is a 69 y.o. female.  HPI    True Mar, MD, PhD [facility] [address] [address] [place], [place] [address]  Dear Eather,   I saw your patient, Bianca Campbell, upon your kind request in my sleep clinic today for initial consultation of her sleep disorder, in particular, concern for underlying obstructive sleep apnea.  The patient is unaccompanied today.  As you know, Ms. 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 Her Epworth sleepiness score is 7 out of 24, fatigue severity score is 16 out of 63.  She has not been noted to have breathing pauses while asleep.  She denies recurrent headaches or family history of sleep apnea.  She has nocturia about once or twice per average night.  She goes to bed generally between 11 and 1 and rise time is between 7:30 AM and 8:30 AM.  She lives with her husband.  They have 2 grown children, 1 daughter and 1 son.  She drinks no daily caffeine, decaf coffee once or twice a week.  She does not drink any alcohol.  She is a non-smoker.  She is planning to work on weight loss.  She previously worked with a psychologist, educational.  They have no pets in the household.  Sometimes they do have the TV on in the bedroom, per husband's preference.  Sometimes they put a rain sound program on.  She is willing to proceed with a sleep study but reports that she would likely not use a CPAP machine.  She does not feel like she wants to sleep with something on her face.  Her Past Medical History Is Significant For: Past Medical History:  Diagnosis Date   ALLERGIC RHINITIS 09/11/2007   Anemia, unspecified  08/31/2012   Arthritis    knees (11/23/2016)   BACK PAIN    EXTERNAL OTITIS 09/11/2007   GERD 06/06/2007   GOUT 11/14/2007   High cholesterol    HYPERTENSION 06/06/2007   INTERMITTENT VERTIGO 09/11/2007   Irritable bowel syndrome 06/06/2007   LOW BACK PAIN 06/06/2007   OSTEOARTHRITIS 06/06/2007   PONV (postoperative nausea and vomiting)    Primary localized osteoarthritis of right knee    SHOULDER PAIN, RIGHT 12/30/2008   Vertigo of central origin 06/20/2018   Vitamin D  deficiency 03/13/2013    Her Past Surgical History Is Significant For: Past Surgical History:  Procedure Laterality Date   33 HOUR PH STUDY N/A 05/27/2019   Procedure: 24 HOUR PH STUDY;  Surgeon: Albertus Gordy HERO, MD;  Location: [facility];  Service: Gastroenterology;  Laterality: N/A;   ABDOMINAL WALL MESH  REMOVAL  11/27/2000   with LOA for SBO   BILATERAL SALPINGOOPHORECTOMY  10/20/2009   Dr Mat.  done w open LOA Dr Gail   CESAREAN SECTION  1987   CESAREAN SECTION  1991   COLONOSCOPY  2015   pt stated 2015 or 2016 unknown md   ESOPHAGEAL MANOMETRY N/A 05/27/2019   Procedure: ESOPHAGEAL MANOMETRY (EM) WITH IMPEDENCE STUDY;  Surgeon: Albertus Gordy HERO, MD;  Location: [facility];  Service: Gastroenterology;  Laterality: N/A;   INCISIONAL HERNIA REPAIR  1996   with mesh   INCISIONAL HERNIA REPAIR  09/03/2001   open re-repair w mesh.  Dr Adel COAST HERNIA REPAIR  12/31/2002   Incarcerated hernia.  open LOA & 2nd re-repair w mesh  Dr Adel COAST HERNIA REPAIR  09/09/2004   Recurrent incarcerted hernia.  LOA & open 3rd re-repair.  Dr Gail   JOINT REPLACEMENT     KNEE ARTHROSCOPY Bilateral 11/11/2015   LAPAROSCOPIC CHOLECYSTECTOMY  1991   LYSIS OF ADHESION  11/27/2000   for SBO.  Open LOA.  Dr Adel   LYSIS OF ADHESION  10/20/2009   lap c/v open LOA for BSO.   Dr Gail   OVARIAN CYST REMOVAL  1993   TOTAL KNEE ARTHROPLASTY Right 11/21/2016   Procedure: RIGHT TOTAL KNEE  ARTHROPLASTY;  Surgeon: Lamar Millman, MD;  Location: [facility] OR;  Service: Orthopedics;  Laterality: Right;    Her Family History Is Significant For: Family History  Problem Relation Age of Onset   Arthritis Mother        RA   Diabetes Mother    Seizures Mother    CAD Mother        stent   Hypertension Mother    Colon polyps Father 35   Stroke Father    Healthy Sister    Healthy Brother    Asthma Daughter    Healthy Son    Colon cancer Neg Hx    Esophageal cancer Neg Hx    Rectal cancer Neg Hx    Stomach cancer Neg Hx    Sudden death Neg Hx    Sleep apnea Neg Hx     Her Social History Is Significant For: Social History   Socioeconomic History   Marital status: Married    Spouse name: Dempsey   Number of children: 2   Years of education: Not on file   Highest education level: Not on file  Occupational History   Occupation: unemployed recently laid off   Occupation: Retired    Associate Professor: Happily   Tobacco Use   Smoking status: Never   Smokeless tobacco: Never  Vaping Use   Vaping status: Never Used  Substance and Sexual Activity   Alcohol use: No   Drug use: No   Sexual activity: Never  Other Topics Concern   Not on file  Social History Narrative   Pt lives with family    Retired    Social Drivers of Health   Tobacco Use: Low Risk (11/07/2024)   Patient History    Smoking Tobacco Use: Never    Smokeless Tobacco Use: Never    Passive Exposure: Not on file  Financial Resource Strain: Low Risk (07/12/2024)   Overall Financial Resource Strain (CARDIA)    Difficulty of Paying Living Expenses: Not hard at all  Food Insecurity: No Food Insecurity (07/12/2024)   Epic    Worried About Radiation Protection Practitioner of Food in the Last Year: Never true    Ran Out of Food in the Last Year: Never true  Transportation Needs: No Transportation Needs (07/12/2024)   Epic    Lack of Transportation (Medical): No    Lack of Transportation (Non-Medical): No  Physical Activity: Inactive  (07/12/2024)   Exercise Vital Sign    Days of Exercise per Week: 0 days    Minutes of Exercise per Session: 0 min  Stress: No Stress Concern Present (07/12/2024)   [employer] of Occupational Health - Occupational Stress Questionnaire    Feeling of Stress: Not at all  Social Connections: Moderately Integrated (07/12/2024)  Social Connection and Isolation Panel    Frequency of Communication with Friends and Family: Three times a week    Frequency of Social Gatherings with Friends and Family: Twice a week    Attends Religious Services: More than 4 times per year    Active Member of Clubs or Organizations: No    Attends Banker Meetings: Never    Marital Status: Married  Depression (PHQ2-9): Low Risk (10/15/2024)   Depression (PHQ2-9)    PHQ-2 Score: 0  Alcohol Screen: Low Risk (07/12/2024)   Alcohol Screen    Last Alcohol Screening Score (AUDIT): 0  Housing: Low Risk (07/12/2024)   Epic    Unable to Pay for Housing in the Last Year: No    Number of Times Moved in the Last Year: 0    Homeless in the Last Year: No  Utilities: Not At Risk (07/12/2024)   Epic    Threatened with loss of utilities: No  Health Literacy: Adequate Health Literacy (07/12/2024)   B1300 Health Literacy    Frequency of need for help with medical instructions: Never    Her Allergies Are:  Allergies[1]:   Her Current Medications Are:  Outpatient Encounter Medications as of 11/07/2024  Medication Sig   acetaminophen  (TYLENOL ) 500 MG tablet Take 1,000 mg by mouth daily as needed for mild pain (pain score 1-3), headache or moderate pain (pain score 4-6).   amLODipine  (NORVASC ) 10 MG tablet Take 1 tablet (10 mg total) by mouth daily.   Ascorbic Acid (VITAMIN C PO) Take 1 tablet by mouth daily.   atorvastatin  (LIPITOR) 20 MG tablet Take 1 tablet (20 mg total) by mouth daily.   Barberry-Oreg Grape-Goldenseal (BERBERINE COMPLEX PO) Take 2 capsules by mouth daily.   Cholecalciferol  (VITAMIN D3) 5000  units CAPS Take 5,000 Units by mouth daily.   CHROMIUM PO Take 1 capsule by mouth daily.   CINNAMON PO Take 250 mg by mouth 1 day or 1 dose.   clopidogrel  (PLAVIX ) 75 MG tablet Take 75 mg by mouth daily.   gabapentin  (GABARONE ) 400 MG tablet Take 1 tablet (400 mg total) by mouth 3 (three) times daily.   gabapentin  (NEURONTIN ) 400 MG capsule Take 400 mg by mouth 3 (three) times daily.   glucose blood (ONETOUCH VERIO) test strip Use as instructed four time per day E11.9   HYDROcodone -acetaminophen  (NORCO/VICODIN) 5-325 MG tablet Take 1 tablet by mouth every 6 (six) hours as needed.   irbesartan  (AVAPRO ) 300 MG tablet Take 1 tablet (300 mg total) by mouth daily.   Lancets MISC Use as directed four times per day E11.9   meclizine  (ANTIVERT ) 25 MG tablet Take 1 tablet (25 mg total) by mouth 3 (three) times daily as needed for dizziness.   meloxicam  (MOBIC ) 15 MG tablet TAKE 1 TABLET BY MOUTH ONCE DAILY AS NEEDED FOR PAIN   OVER THE COUNTER MEDICATION Take 2 capsules by mouth daily. Glucose advance   pantoprazole  (PROTONIX ) 40 MG tablet Take 1 tablet (40 mg total) by mouth daily. for 6 weeks, then take as needed.   tiZANidine  (ZANAFLEX ) 4 MG tablet Take 1 tablet (4 mg total) by mouth every 6 (six) hours as needed for muscle spasms.   aspirin  EC 81 MG tablet Take 81 mg by mouth daily. Swallow whole.Pt says told to comtinue taking ASA (as well as Plavix ) (Patient not taking: Reported on 10/30/2024)   No facility-administered encounter medications on file as of 11/07/2024.  :   Review of Systems:  Out of a complete 14 point review of systems, all are reviewed and negative with the exception of these symptoms as listed below:  Review of Systems  Objective:  Neurological Exam  Physical Exam Physical Examination:   Vitals:   11/07/24 1132 11/07/24 1146  BP: (!) 148/86 138/81  Pulse: 72 74    General Examination: The patient is a very pleasant 69 y.o. female in no acute distress. She appears  well-developed and well-nourished and well groomed.   HEENT: Normocephalic, atraumatic, pupils are equal, round and reactive to light, extraocular tracking is good without limitation to gaze excursion or nystagmus noted. No photophobia.  Corrective eye glasses in place. Hearing is grossly intact.  Face is symmetric with normal facial animation. Speech is clear without dysarthria. There is no hypophonia. There is no lip, neck/head, jaw or voice tremor. Neck is supple with full range of passive and active motion. There are no carotid bruits on auscultation.  Airway/Oropharynx exam reveals: mild mouth dryness, adequate dental hygiene and moderate airway crowding, due to small airway entry and elongated uvula, slightly wider tongue.  Mallampati class III, neck circumference 15-1/2 inches.  Tongue protrudes centrally and palate elevates symmetrically.  Chest: Clear to auscultation without wheezing, rhonchi or crackles noted.  Heart: S1+S2+0, regular and normal without murmurs, rubs or gallops noted.   Abdomen: Soft, non-tender and non-distended.  Extremities: There is 1+ pitting edema in the left distal lower extremity, trace to 1+ in the right distal lower extremity.    Skin: Warm and dry without trophic changes noted.   Musculoskeletal: exam reveals left knee swelling and pain.     Neurologically:  Mental status: The patient is awake, alert and oriented in all 4 spheres. Her immediate and remote memory, attention, language skills and fund of knowledge are appropriate. There is no evidence of aphasia, agnosia, apraxia or anomia. Speech is clear with normal prosody and enunciation. Thought process is linear. Mood is normal and affect is normal.  Cranial nerves II - XII are as described above under HEENT exam.  Motor exam: Normal bulk, strength and tone is noted with the exception of mild grip strength weakness on the right and mild hip flexor weakness on the right. There is no obvious action or  resting tremor.  Fine motor skills and coordination: Intact grossly.  Cerebellar testing: No dysmetria or intention tremor. There is no truncal or gait ataxia.  Sensory exam: intact to light touch in the upper and lower extremities.  Gait, station and balance: She stands with mild difficulty and pushes herself up.  She walks with a rolling walker.  She has a mild limp on the right.    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 A laboratory attended sleep study is typically considered gold standard for evaluation of sleep disordered breathing.   I had a long chat with the patient about my findings and the diagnosis of sleep apnea, particularly OSA, its prognosis and treatment options. We talked about medical/conservative treatments, surgical interventions and non-pharmacological approaches for symptom control. I explained, in particular, the risks and ramifications of untreated moderate to severe OSA, especially with respect to developing cardiovascular  disease down the road, including congestive heart failure (CHF), difficult to treat hypertension, cardiac arrhythmias (particularly A-fib), neurovascular complications including TIA, stroke and dementia. Even type 2 diabetes has, in part, been linked to untreated OSA. Symptoms of untreated OSA may include (but may not be limited to) daytime sleepiness, nocturia (i.e. frequent nighttime urination), memory problems, mood irritability and suboptimally controlled or worsening mood disorder such as depression and/or anxiety, lack of energy, lack of  motivation, physical discomfort, as well as recurrent headaches, especially morning or nocturnal headaches. We talked about the importance of maintaining a healthy lifestyle and striving for healthy weight. In addition, we talked about the importance of striving for and maintaining good sleep hygiene. I recommended a sleep study at this time. I outlined the differences between a laboratory attended sleep study which is considered more comprehensive and accurate over the option of a home sleep test (HST); the latter may lead to underestimation of sleep disordered breathing in some instances and does not help with diagnosing upper airway resistance syndrome and is not accurate enough to diagnose primary central sleep apnea typically. I outlined possible surgical and non-surgical treatment options of OSA, including the use of a positive airway pressure (PAP) device (i.e. CPAP, AutoPAP/APAP or BiPAP in certain circumstances), a custom-made dental device (aka oral appliance, which would require a referral to a specialist dentist or orthodontist typically, and is generally speaking not considered for patients with full dentures or edentulous state), upper airway surgical options, such as traditional UPPP (which is not considered a first-line treatment) or the Inspire device (hypoglossal nerve stimulator, which would involve a referral for consultation with an ENT surgeon, after careful selection, following inclusion criteria - also not first-line treatment). I explained the PAP treatment option to the patient in detail, as this is generally considered first-line treatment.  The patient indicated that she would likely not be willing to try PAP therapy.    We will pick up our discussion about the next steps and treatment options after testing.  We will keep her posted as to the test results by phone call and/or MyChart messaging where possible.  We will plan to follow-up in sleep clinic accordingly as well.  I answered  all her questions today and the patient was in agreement.   I encouraged her to call with any interim questions, concerns, problems or updates or email us  through MyChart.  Generally speaking, sleep test authorizations may take up to 2 weeks, sometimes less, sometimes longer, the patient is encouraged to get in touch with us  if they do not hear back from the sleep lab staff directly within the next 2 weeks.  Thank you very much for allowing me to participate in the care of this nice patient. If I can be of any further assistance to you please do not hesitate to call me at [phone].  Sincerely,   True Mar, MD, PhD     [1]  Allergies Allergen Reactions   Latex Itching

## 2024-11-12 ENCOUNTER — Telehealth: Payer: Self-pay | Admitting: Neurology

## 2024-11-12 NOTE — Telephone Encounter (Signed)
 11/12/24  LVM [place] 11/07/24 [employer] medicare no auth req for either NPSG or HST

## 2024-11-13 ENCOUNTER — Encounter: Admitting: Internal Medicine

## 2024-12-10 ENCOUNTER — Ambulatory Visit: Admitting: Internal Medicine

## 2025-01-13 ENCOUNTER — Ambulatory Visit: Admitting: Internal Medicine

## 2025-07-15 ENCOUNTER — Ambulatory Visit: Admitting: Neurology

## 2025-07-16 ENCOUNTER — Encounter: Admitting: Internal Medicine

## 2025-07-18 ENCOUNTER — Ambulatory Visit
